# Patient Record
Sex: Female | Born: 1955 | Race: Black or African American | Hispanic: No | Marital: Married | Smoking: Current every day smoker
Health system: Southern US, Community
[De-identification: ages and names within clinical notes are randomized; demographics above are authoritative.]

## PROBLEM LIST (undated history)

## (undated) DIAGNOSIS — E785 Hyperlipidemia, unspecified: Secondary | ICD-10-CM

## (undated) DIAGNOSIS — I639 Cerebral infarction, unspecified: Secondary | ICD-10-CM

## (undated) DIAGNOSIS — M1990 Unspecified osteoarthritis, unspecified site: Secondary | ICD-10-CM

## (undated) DIAGNOSIS — I2510 Atherosclerotic heart disease of native coronary artery without angina pectoris: Secondary | ICD-10-CM

## (undated) DIAGNOSIS — D649 Anemia, unspecified: Secondary | ICD-10-CM

## (undated) DIAGNOSIS — M069 Rheumatoid arthritis, unspecified: Secondary | ICD-10-CM

## (undated) DIAGNOSIS — I70261 Atherosclerosis of native arteries of extremities with gangrene, right leg: Secondary | ICD-10-CM

## (undated) DIAGNOSIS — I10 Essential (primary) hypertension: Secondary | ICD-10-CM

## (undated) DIAGNOSIS — K635 Polyp of colon: Secondary | ICD-10-CM

## (undated) DIAGNOSIS — N179 Acute kidney failure, unspecified: Secondary | ICD-10-CM

## (undated) HISTORY — PX: ANKLE SURGERY: SHX546

## (undated) HISTORY — DX: Unspecified osteoarthritis, unspecified site: M19.90

## (undated) HISTORY — PX: CAROTID STENT INSERTION: SHX5766

## (undated) HISTORY — DX: Rheumatoid arthritis, unspecified: M06.9

## (undated) HISTORY — DX: Polyp of colon: K63.5

## (undated) HISTORY — DX: Atherosclerosis of native arteries of extremities with gangrene, right leg: I70.261

## (undated) HISTORY — DX: Cerebral infarction, unspecified: I63.9

## (undated) HISTORY — DX: Hyperlipidemia, unspecified: E78.5

## (undated) HISTORY — PX: OOPHORECTOMY: SHX86

## (undated) HISTORY — PX: FRACTURE SURGERY: SHX138

## (undated) HISTORY — DX: Atherosclerotic heart disease of native coronary artery without angina pectoris: I25.10

## (undated) HISTORY — DX: Essential (primary) hypertension: I10

## (undated) HISTORY — DX: Anemia, unspecified: D64.9

---

## 2003-04-20 DIAGNOSIS — I10 Essential (primary) hypertension: Secondary | ICD-10-CM | POA: Diagnosis present

## 2003-04-20 HISTORY — DX: Essential (primary) hypertension: I10

## 2003-05-23 ENCOUNTER — Ambulatory Visit (HOSPITAL_COMMUNITY): Admission: RE | Admit: 2003-05-23 | Discharge: 2003-05-23 | Payer: Self-pay | Admitting: General Surgery

## 2003-07-03 ENCOUNTER — Ambulatory Visit (HOSPITAL_COMMUNITY): Admission: RE | Admit: 2003-07-03 | Discharge: 2003-07-03 | Payer: Self-pay | Admitting: General Surgery

## 2003-07-19 DIAGNOSIS — I2510 Atherosclerotic heart disease of native coronary artery without angina pectoris: Secondary | ICD-10-CM

## 2003-07-19 HISTORY — DX: Atherosclerotic heart disease of native coronary artery without angina pectoris: I25.10

## 2003-07-23 ENCOUNTER — Inpatient Hospital Stay (HOSPITAL_COMMUNITY): Admission: EM | Admit: 2003-07-23 | Discharge: 2003-07-25 | Payer: Self-pay | Admitting: *Deleted

## 2004-01-28 ENCOUNTER — Encounter (HOSPITAL_COMMUNITY): Admission: RE | Admit: 2004-01-28 | Discharge: 2004-01-28 | Payer: Self-pay | Admitting: Cardiology

## 2004-02-26 ENCOUNTER — Ambulatory Visit: Payer: Self-pay | Admitting: Cardiology

## 2005-12-21 ENCOUNTER — Encounter: Admission: RE | Admit: 2005-12-21 | Discharge: 2005-12-21 | Payer: Self-pay

## 2006-02-14 ENCOUNTER — Ambulatory Visit: Payer: Self-pay | Admitting: Cardiology

## 2006-05-03 ENCOUNTER — Ambulatory Visit (HOSPITAL_BASED_OUTPATIENT_CLINIC_OR_DEPARTMENT_OTHER): Admission: RE | Admit: 2006-05-03 | Discharge: 2006-05-03 | Payer: Self-pay | Admitting: Orthopedic Surgery

## 2007-05-23 ENCOUNTER — Ambulatory Visit: Payer: Self-pay | Admitting: Cardiology

## 2007-12-16 ENCOUNTER — Emergency Department (HOSPITAL_COMMUNITY): Admission: EM | Admit: 2007-12-16 | Discharge: 2007-12-17 | Payer: Self-pay | Admitting: Emergency Medicine

## 2008-04-19 DIAGNOSIS — K635 Polyp of colon: Secondary | ICD-10-CM

## 2008-04-19 HISTORY — DX: Polyp of colon: K63.5

## 2008-06-11 ENCOUNTER — Encounter: Payer: Self-pay | Admitting: Family Medicine

## 2008-08-17 DIAGNOSIS — I639 Cerebral infarction, unspecified: Secondary | ICD-10-CM

## 2008-08-17 HISTORY — DX: Cerebral infarction, unspecified: I63.9

## 2008-08-30 ENCOUNTER — Inpatient Hospital Stay (HOSPITAL_COMMUNITY): Admission: AD | Admit: 2008-08-30 | Discharge: 2008-09-04 | Payer: Self-pay | Admitting: Internal Medicine

## 2008-09-02 ENCOUNTER — Encounter (INDEPENDENT_AMBULATORY_CARE_PROVIDER_SITE_OTHER): Payer: Self-pay | Admitting: Internal Medicine

## 2008-09-03 ENCOUNTER — Ambulatory Visit: Payer: Self-pay | Admitting: Physical Medicine & Rehabilitation

## 2008-09-04 ENCOUNTER — Ambulatory Visit: Payer: Self-pay | Admitting: Physical Medicine & Rehabilitation

## 2008-09-04 ENCOUNTER — Ambulatory Visit: Payer: Self-pay | Admitting: Cardiology

## 2008-09-04 ENCOUNTER — Encounter: Payer: Self-pay | Admitting: Cardiology

## 2008-09-04 ENCOUNTER — Inpatient Hospital Stay (HOSPITAL_COMMUNITY)
Admission: RE | Admit: 2008-09-04 | Discharge: 2008-10-02 | Payer: Self-pay | Admitting: Physical Medicine & Rehabilitation

## 2008-10-14 ENCOUNTER — Encounter (INDEPENDENT_AMBULATORY_CARE_PROVIDER_SITE_OTHER): Payer: Self-pay | Admitting: *Deleted

## 2008-10-14 LAB — CONVERTED CEMR LAB
ALT: 21 units/L
AST: 20 units/L
Albumin: 4.2 g/dL
Alkaline Phosphatase: 73 units/L
BUN: 7 mg/dL
CO2: 22 meq/L
Calcium: 9.4 mg/dL
Chloride: 106 meq/L
Cholesterol: 177 mg/dL
Creatinine, Ser: 0.54 mg/dL
Glucose, Bld: 85 mg/dL
HDL: 41 mg/dL
LDL Cholesterol: 112 mg/dL
Potassium: 4.2 meq/L
Sodium: 142 meq/L
Total Protein: 7.8 g/dL
Triglycerides: 121 mg/dL

## 2008-10-25 ENCOUNTER — Encounter
Admission: RE | Admit: 2008-10-25 | Discharge: 2009-01-23 | Payer: Self-pay | Admitting: Physical Medicine & Rehabilitation

## 2008-10-29 ENCOUNTER — Ambulatory Visit: Payer: Self-pay | Admitting: Physical Medicine & Rehabilitation

## 2008-11-07 ENCOUNTER — Emergency Department (HOSPITAL_COMMUNITY): Admission: EM | Admit: 2008-11-07 | Discharge: 2008-11-07 | Payer: Self-pay | Admitting: Emergency Medicine

## 2008-11-08 ENCOUNTER — Encounter (INDEPENDENT_AMBULATORY_CARE_PROVIDER_SITE_OTHER): Payer: Self-pay | Admitting: *Deleted

## 2008-11-14 ENCOUNTER — Encounter (INDEPENDENT_AMBULATORY_CARE_PROVIDER_SITE_OTHER): Payer: Self-pay | Admitting: *Deleted

## 2008-11-14 ENCOUNTER — Ambulatory Visit: Payer: Self-pay | Admitting: Family Medicine

## 2008-11-14 DIAGNOSIS — K625 Hemorrhage of anus and rectum: Secondary | ICD-10-CM | POA: Insufficient documentation

## 2008-11-14 DIAGNOSIS — I10 Essential (primary) hypertension: Secondary | ICD-10-CM | POA: Insufficient documentation

## 2008-11-14 DIAGNOSIS — E785 Hyperlipidemia, unspecified: Secondary | ICD-10-CM | POA: Insufficient documentation

## 2008-11-17 HISTORY — PX: COLONOSCOPY W/ POLYPECTOMY: SHX1380

## 2008-11-18 ENCOUNTER — Encounter: Payer: Self-pay | Admitting: Family Medicine

## 2008-11-19 ENCOUNTER — Encounter: Payer: Self-pay | Admitting: Family Medicine

## 2008-11-20 ENCOUNTER — Encounter: Payer: Self-pay | Admitting: Family Medicine

## 2008-11-20 LAB — CONVERTED CEMR LAB: Retic Ct Pct: 0.9 % (ref 0.4–3.1)

## 2008-11-21 ENCOUNTER — Encounter (INDEPENDENT_AMBULATORY_CARE_PROVIDER_SITE_OTHER): Payer: Self-pay | Admitting: *Deleted

## 2008-11-22 ENCOUNTER — Encounter: Payer: Self-pay | Admitting: Family Medicine

## 2008-11-26 LAB — CONVERTED CEMR LAB
ALT: 16 units/L (ref 0–35)
AST: 21 units/L (ref 0–37)
Albumin: 3.9 g/dL (ref 3.5–5.2)
Alkaline Phosphatase: 77 units/L (ref 39–117)
BUN: 5 mg/dL — ABNORMAL LOW (ref 6–23)
Basophils Absolute: 0 10*3/uL (ref 0.0–0.1)
Basophils Relative: 0 % (ref 0–1)
Bilirubin, Direct: 0.1 mg/dL (ref 0.0–0.3)
CO2: 22 meq/L (ref 19–32)
Calcium: 9.6 mg/dL (ref 8.4–10.5)
Chloride: 106 meq/L (ref 96–112)
Cholesterol: 174 mg/dL (ref 0–200)
Creatinine, Ser: 0.55 mg/dL (ref 0.40–1.20)
Eosinophils Absolute: 0.1 10*3/uL (ref 0.0–0.7)
Eosinophils Relative: 1 % (ref 0–5)
Glucose, Bld: 84 mg/dL (ref 70–99)
HCT: 34.1 % — ABNORMAL LOW (ref 36.0–46.0)
HDL: 39 mg/dL — ABNORMAL LOW (ref >39)
Hemoglobin: 10.6 g/dL — ABNORMAL LOW (ref 12.0–15.0)
Indirect Bilirubin: 0.3 mg/dL (ref 0.0–0.9)
LDL Cholesterol: 115 mg/dL — ABNORMAL HIGH (ref 0–99)
Lymphocytes Relative: 38 % (ref 12–46)
Lymphs Abs: 2.2 10*3/uL (ref 0.7–4.0)
MCHC: 31.1 g/dL (ref 30.0–36.0)
MCV: 91.7 fL (ref 78.0–100.0)
Monocytes Absolute: 0.7 10*3/uL (ref 0.1–1.0)
Monocytes Relative: 12 % (ref 3–12)
Neutro Abs: 2.9 10*3/uL (ref 1.7–7.7)
Neutrophils Relative %: 49 % (ref 43–77)
Platelets: 322 10*3/uL (ref 150–400)
Potassium: 3.9 meq/L (ref 3.5–5.3)
RBC: 3.72 M/uL — ABNORMAL LOW (ref 3.87–5.11)
RDW: 14.6 % (ref 11.5–15.5)
Sodium: 143 meq/L (ref 135–145)
TSH: 1.035 microintl units/mL (ref 0.350–4.500)
Total Bilirubin: 0.4 mg/dL (ref 0.3–1.2)
Total CHOL/HDL Ratio: 4.5
Total Protein: 7.5 g/dL (ref 6.0–8.3)
Triglycerides: 101 mg/dL (ref <150)
VLDL: 20 mg/dL (ref 0–40)
WBC: 6 10*3/uL (ref 4.0–10.5)

## 2008-11-28 ENCOUNTER — Telehealth: Payer: Self-pay | Admitting: Internal Medicine

## 2008-11-28 ENCOUNTER — Encounter: Payer: Self-pay | Admitting: Internal Medicine

## 2008-11-28 ENCOUNTER — Telehealth (INDEPENDENT_AMBULATORY_CARE_PROVIDER_SITE_OTHER): Payer: Self-pay | Admitting: *Deleted

## 2008-11-28 ENCOUNTER — Emergency Department (HOSPITAL_COMMUNITY): Admission: EM | Admit: 2008-11-28 | Discharge: 2008-11-28 | Payer: Self-pay | Admitting: Emergency Medicine

## 2008-11-28 ENCOUNTER — Telehealth: Payer: Self-pay | Admitting: Family Medicine

## 2008-11-29 ENCOUNTER — Ambulatory Visit: Payer: Self-pay | Admitting: Internal Medicine

## 2008-11-29 ENCOUNTER — Encounter: Payer: Self-pay | Admitting: Internal Medicine

## 2008-11-29 ENCOUNTER — Ambulatory Visit (HOSPITAL_COMMUNITY): Admission: RE | Admit: 2008-11-29 | Discharge: 2008-11-29 | Payer: Self-pay | Admitting: Internal Medicine

## 2008-12-10 ENCOUNTER — Ambulatory Visit: Payer: Self-pay | Admitting: Cardiology

## 2008-12-10 ENCOUNTER — Encounter: Payer: Self-pay | Admitting: Internal Medicine

## 2008-12-11 ENCOUNTER — Encounter (INDEPENDENT_AMBULATORY_CARE_PROVIDER_SITE_OTHER): Payer: Self-pay | Admitting: *Deleted

## 2008-12-12 ENCOUNTER — Encounter (INDEPENDENT_AMBULATORY_CARE_PROVIDER_SITE_OTHER): Payer: Self-pay | Admitting: *Deleted

## 2008-12-12 ENCOUNTER — Encounter: Payer: Self-pay | Admitting: Cardiology

## 2008-12-12 LAB — CONVERTED CEMR LAB
ALT: 20 units/L
ALT: 20 units/L (ref 0–35)
AST: 22 units/L
AST: 22 units/L (ref 0–37)
Albumin: 4.2 g/dL
Albumin: 4.2 g/dL (ref 3.5–5.2)
Alkaline Phosphatase: 79 units/L
Alkaline Phosphatase: 79 units/L (ref 39–117)
BUN: 10 mg/dL
BUN: 10 mg/dL (ref 6–23)
CO2: 25 meq/L
CO2: 25 meq/L (ref 19–32)
Calcium: 9.8 mg/dL
Calcium: 9.8 mg/dL (ref 8.4–10.5)
Chloride: 104 meq/L
Chloride: 104 meq/L (ref 96–112)
Cholesterol: 167 mg/dL
Cholesterol: 167 mg/dL (ref 0–200)
Creatinine, Ser: 0.48 mg/dL
Creatinine, Ser: 0.48 mg/dL (ref 0.40–1.20)
Glucose, Bld: 88 mg/dL
Glucose, Bld: 88 mg/dL (ref 70–99)
HDL: 42 mg/dL
HDL: 42 mg/dL (ref >39)
LDL Cholesterol: 109 mg/dL
LDL Cholesterol: 109 mg/dL — ABNORMAL HIGH (ref 0–99)
Potassium: 3.6 meq/L
Potassium: 3.6 meq/L (ref 3.5–5.3)
Sodium: 140 meq/L
Sodium: 140 meq/L (ref 135–145)
Total Bilirubin: 0.3 mg/dL (ref 0.3–1.2)
Total CHOL/HDL Ratio: 4
Total Protein: 8 g/dL
Total Protein: 8 g/dL (ref 6.0–8.3)
Triglycerides: 78 mg/dL
Triglycerides: 78 mg/dL (ref <150)
VLDL: 16 mg/dL (ref 0–40)

## 2008-12-16 ENCOUNTER — Ambulatory Visit: Payer: Self-pay | Admitting: Physical Medicine & Rehabilitation

## 2008-12-16 ENCOUNTER — Encounter: Payer: Self-pay | Admitting: Internal Medicine

## 2008-12-16 ENCOUNTER — Encounter: Payer: Self-pay | Admitting: Cardiology

## 2008-12-16 ENCOUNTER — Telehealth (INDEPENDENT_AMBULATORY_CARE_PROVIDER_SITE_OTHER): Payer: Self-pay

## 2008-12-17 ENCOUNTER — Telehealth: Payer: Self-pay | Admitting: Family Medicine

## 2008-12-17 ENCOUNTER — Telehealth (INDEPENDENT_AMBULATORY_CARE_PROVIDER_SITE_OTHER): Payer: Self-pay

## 2008-12-17 ENCOUNTER — Ambulatory Visit: Payer: Self-pay | Admitting: Internal Medicine

## 2008-12-18 ENCOUNTER — Encounter: Payer: Self-pay | Admitting: Internal Medicine

## 2009-01-03 LAB — CONVERTED CEMR LAB
Basophils Absolute: 0 10*3/uL (ref 0.0–0.1)
Basophils Relative: 1 % (ref 0–1)
Eosinophils Absolute: 0 10*3/uL (ref 0.0–0.7)
Eosinophils Relative: 1 % (ref 0–5)
HCT: 31.3 % — ABNORMAL LOW (ref 36.0–46.0)
Hemoglobin: 10.3 g/dL — ABNORMAL LOW (ref 12.0–15.0)
Lymphocytes Relative: 33 % (ref 12–46)
Lymphs Abs: 1.7 10*3/uL (ref 0.7–4.0)
MCHC: 32.9 g/dL (ref 30.0–36.0)
MCV: 91 fL (ref 78.0–100.0)
Monocytes Absolute: 0.4 10*3/uL (ref 0.1–1.0)
Monocytes Relative: 7 % (ref 3–12)
Neutro Abs: 3 10*3/uL (ref 1.7–7.7)
Neutrophils Relative %: 58 % (ref 43–77)
Platelets: 240 10*3/uL (ref 150–400)
RBC: 3.44 M/uL — ABNORMAL LOW (ref 3.87–5.11)
RDW: 15.6 % — ABNORMAL HIGH (ref 11.5–15.5)
WBC: 5.1 10*3/uL (ref 4.0–10.5)

## 2009-01-07 ENCOUNTER — Encounter: Payer: Self-pay | Admitting: Internal Medicine

## 2009-01-09 LAB — CONVERTED CEMR LAB
Basophils Absolute: 0 10*3/uL (ref 0.0–0.1)
Basophils Relative: 0 % (ref 0–1)
Eosinophils Absolute: 0 10*3/uL (ref 0.0–0.7)
Eosinophils Relative: 1 % (ref 0–5)
HCT: 32.1 % — ABNORMAL LOW (ref 36.0–46.0)
Hemoglobin: 10.5 g/dL — ABNORMAL LOW (ref 12.0–15.0)
Lymphocytes Relative: 33 % (ref 12–46)
Lymphs Abs: 2.1 10*3/uL (ref 0.7–4.0)
MCHC: 32.7 g/dL (ref 30.0–36.0)
MCV: 87.5 fL (ref 78.0–100.0)
Monocytes Absolute: 0.9 10*3/uL (ref 0.1–1.0)
Monocytes Relative: 13 % — ABNORMAL HIGH (ref 3–12)
Neutro Abs: 3.5 10*3/uL (ref 1.7–7.7)
Neutrophils Relative %: 53 % (ref 43–77)
Platelets: 286 10*3/uL (ref 150–400)
RBC: 3.67 M/uL — ABNORMAL LOW (ref 3.87–5.11)
RDW: 15.8 % — ABNORMAL HIGH (ref 11.5–15.5)
WBC: 6.5 10*3/uL (ref 4.0–10.5)

## 2009-01-10 ENCOUNTER — Encounter
Admission: RE | Admit: 2009-01-10 | Discharge: 2009-04-10 | Payer: Self-pay | Admitting: Physical Medicine & Rehabilitation

## 2009-01-15 ENCOUNTER — Ambulatory Visit: Payer: Self-pay | Admitting: Family Medicine

## 2009-01-15 DIAGNOSIS — B351 Tinea unguium: Secondary | ICD-10-CM | POA: Insufficient documentation

## 2009-01-16 ENCOUNTER — Ambulatory Visit
Admission: RE | Admit: 2009-01-16 | Discharge: 2009-01-16 | Payer: Self-pay | Admitting: Physical Medicine & Rehabilitation

## 2009-01-17 ENCOUNTER — Ambulatory Visit: Payer: Self-pay | Admitting: Physical Medicine & Rehabilitation

## 2009-01-21 ENCOUNTER — Encounter (HOSPITAL_COMMUNITY)
Admission: RE | Admit: 2009-01-21 | Discharge: 2009-02-20 | Payer: Self-pay | Admitting: Physical Medicine & Rehabilitation

## 2009-02-03 ENCOUNTER — Telehealth: Payer: Self-pay | Admitting: Family Medicine

## 2009-02-11 ENCOUNTER — Encounter: Payer: Self-pay | Admitting: Family Medicine

## 2009-02-21 ENCOUNTER — Ambulatory Visit: Payer: Self-pay | Admitting: Physical Medicine & Rehabilitation

## 2009-02-21 ENCOUNTER — Encounter (HOSPITAL_COMMUNITY): Admission: RE | Admit: 2009-02-21 | Discharge: 2009-03-27 | Payer: Self-pay

## 2009-03-05 ENCOUNTER — Encounter (INDEPENDENT_AMBULATORY_CARE_PROVIDER_SITE_OTHER): Payer: Self-pay

## 2009-03-28 ENCOUNTER — Ambulatory Visit (HOSPITAL_COMMUNITY): Admission: RE | Admit: 2009-03-28 | Discharge: 2009-03-28 | Payer: Self-pay | Admitting: Interventional Radiology

## 2009-03-28 ENCOUNTER — Encounter (HOSPITAL_COMMUNITY)
Admission: RE | Admit: 2009-03-28 | Discharge: 2009-04-18 | Payer: Self-pay | Admitting: Physical Medicine & Rehabilitation

## 2009-04-01 ENCOUNTER — Ambulatory Visit: Payer: Self-pay | Admitting: Internal Medicine

## 2009-04-01 DIAGNOSIS — D649 Anemia, unspecified: Secondary | ICD-10-CM

## 2009-04-01 DIAGNOSIS — D539 Nutritional anemia, unspecified: Secondary | ICD-10-CM | POA: Insufficient documentation

## 2009-04-21 ENCOUNTER — Encounter
Admission: RE | Admit: 2009-04-21 | Discharge: 2009-07-20 | Payer: Self-pay | Admitting: Physical Medicine & Rehabilitation

## 2009-04-22 ENCOUNTER — Encounter (HOSPITAL_COMMUNITY)
Admission: RE | Admit: 2009-04-22 | Discharge: 2009-05-22 | Payer: Self-pay | Admitting: Physical Medicine & Rehabilitation

## 2009-04-28 ENCOUNTER — Emergency Department (HOSPITAL_COMMUNITY): Admission: EM | Admit: 2009-04-28 | Discharge: 2009-04-28 | Payer: Self-pay | Admitting: Emergency Medicine

## 2009-05-01 ENCOUNTER — Ambulatory Visit: Payer: Self-pay | Admitting: Family Medicine

## 2009-05-01 ENCOUNTER — Encounter
Admission: RE | Admit: 2009-05-01 | Discharge: 2009-07-30 | Payer: Self-pay | Admitting: Physical Medicine & Rehabilitation

## 2009-05-02 ENCOUNTER — Encounter: Payer: Self-pay | Admitting: Family Medicine

## 2009-05-06 ENCOUNTER — Ambulatory Visit (HOSPITAL_COMMUNITY): Admission: RE | Admit: 2009-05-06 | Discharge: 2009-05-06 | Payer: Self-pay | Admitting: Family Medicine

## 2009-05-06 ENCOUNTER — Encounter: Payer: Self-pay | Admitting: Family Medicine

## 2009-05-06 LAB — HM MAMMOGRAPHY

## 2009-05-07 ENCOUNTER — Encounter: Payer: Self-pay | Admitting: Family Medicine

## 2009-05-07 LAB — CONVERTED CEMR LAB
Cholesterol: 168 mg/dL (ref 0–200)
HDL: 37 mg/dL — ABNORMAL LOW (ref >39)
LDL Cholesterol: 109 mg/dL — ABNORMAL HIGH (ref 0–99)
Total CHOL/HDL Ratio: 4.5
Triglycerides: 111 mg/dL (ref <150)
VLDL: 22 mg/dL (ref 0–40)

## 2009-05-12 ENCOUNTER — Ambulatory Visit: Payer: Self-pay | Admitting: Gastroenterology

## 2009-05-15 ENCOUNTER — Encounter: Payer: Self-pay | Admitting: Internal Medicine

## 2009-05-23 ENCOUNTER — Encounter (INDEPENDENT_AMBULATORY_CARE_PROVIDER_SITE_OTHER): Payer: Self-pay

## 2009-05-27 ENCOUNTER — Encounter (HOSPITAL_COMMUNITY)
Admission: RE | Admit: 2009-05-27 | Discharge: 2009-07-29 | Payer: Self-pay | Admitting: Physical Medicine & Rehabilitation

## 2009-07-11 ENCOUNTER — Encounter: Payer: Self-pay | Admitting: Family Medicine

## 2009-07-18 ENCOUNTER — Encounter: Payer: Self-pay | Admitting: Family Medicine

## 2009-07-30 ENCOUNTER — Encounter (HOSPITAL_COMMUNITY)
Admission: RE | Admit: 2009-07-30 | Discharge: 2009-08-29 | Payer: Self-pay | Admitting: Physical Medicine & Rehabilitation

## 2009-08-06 ENCOUNTER — Ambulatory Visit: Payer: Self-pay | Admitting: Family Medicine

## 2009-08-06 LAB — CONVERTED CEMR LAB
ALT: 16 units/L (ref 0–35)
AST: 18 units/L (ref 0–37)
Albumin: 4.1 g/dL (ref 3.5–5.2)
Alkaline Phosphatase: 79 units/L (ref 39–117)
BUN: 7 mg/dL (ref 6–23)
CO2: 24 meq/L (ref 19–32)
Calcium: 9.9 mg/dL (ref 8.4–10.5)
Chloride: 104 meq/L (ref 96–112)
Cholesterol: 170 mg/dL (ref 0–200)
Creatinine, Ser: 0.54 mg/dL (ref 0.40–1.20)
Glucose, Bld: 92 mg/dL (ref 70–99)
HCT: 36.8 % (ref 36.0–46.0)
HDL: 37 mg/dL — ABNORMAL LOW (ref >39)
Hemoglobin: 12.1 g/dL (ref 12.0–15.0)
LDL Cholesterol: 102 mg/dL — ABNORMAL HIGH (ref 0–99)
MCHC: 32.9 g/dL (ref 30.0–36.0)
MCV: 90.4 fL (ref 78.0–100.0)
Platelets: 264 10*3/uL (ref 150–400)
Potassium: 4.5 meq/L (ref 3.5–5.3)
RBC: 4.07 M/uL (ref 3.87–5.11)
RDW: 15.3 % (ref 11.5–15.5)
Sodium: 142 meq/L (ref 135–145)
TSH: 0.579 microintl units/mL (ref 0.350–4.500)
Total Bilirubin: 0.3 mg/dL (ref 0.3–1.2)
Total CHOL/HDL Ratio: 4.6
Total Protein: 7.6 g/dL (ref 6.0–8.3)
Triglycerides: 157 mg/dL — ABNORMAL HIGH (ref <150)
VLDL: 31 mg/dL (ref 0–40)
Vit D, 25-Hydroxy: 46 ng/mL (ref 30–89)
WBC: 6.5 10*3/uL (ref 4.0–10.5)

## 2009-08-15 ENCOUNTER — Ambulatory Visit: Payer: Self-pay | Admitting: Internal Medicine

## 2009-09-04 ENCOUNTER — Encounter (HOSPITAL_COMMUNITY)
Admission: RE | Admit: 2009-09-04 | Discharge: 2009-10-04 | Payer: Self-pay | Admitting: Physical Medicine & Rehabilitation

## 2009-09-09 ENCOUNTER — Encounter: Payer: Self-pay | Admitting: Family Medicine

## 2009-09-24 ENCOUNTER — Ambulatory Visit: Payer: Self-pay | Admitting: Family Medicine

## 2009-10-01 DIAGNOSIS — R4701 Aphasia: Secondary | ICD-10-CM | POA: Insufficient documentation

## 2009-10-02 ENCOUNTER — Encounter: Payer: Self-pay | Admitting: Family Medicine

## 2009-10-06 ENCOUNTER — Encounter (HOSPITAL_COMMUNITY)
Admission: RE | Admit: 2009-10-06 | Discharge: 2009-11-05 | Payer: Self-pay | Admitting: Physical Medicine & Rehabilitation

## 2009-10-07 ENCOUNTER — Telehealth: Payer: Self-pay | Admitting: Family Medicine

## 2009-10-10 ENCOUNTER — Encounter: Payer: Self-pay | Admitting: Family Medicine

## 2009-10-28 ENCOUNTER — Encounter: Payer: Self-pay | Admitting: Family Medicine

## 2009-10-31 ENCOUNTER — Telehealth: Payer: Self-pay | Admitting: Family Medicine

## 2009-11-03 ENCOUNTER — Encounter: Payer: Self-pay | Admitting: Family Medicine

## 2009-11-06 ENCOUNTER — Ambulatory Visit: Payer: Self-pay | Admitting: Family Medicine

## 2009-11-06 ENCOUNTER — Encounter (HOSPITAL_COMMUNITY)
Admission: RE | Admit: 2009-11-06 | Discharge: 2009-12-06 | Payer: Self-pay | Source: Home / Self Care | Admitting: Physical Medicine & Rehabilitation

## 2009-11-07 ENCOUNTER — Encounter: Payer: Self-pay | Admitting: Family Medicine

## 2009-11-14 LAB — CONVERTED CEMR LAB
ALT: 21 units/L (ref 0–35)
AST: 20 units/L (ref 0–37)
Albumin: 4.2 g/dL (ref 3.5–5.2)
Alkaline Phosphatase: 73 units/L (ref 39–117)
BUN: 7 mg/dL (ref 6–23)
CO2: 22 meq/L (ref 19–32)
Calcium: 9.4 mg/dL (ref 8.4–10.5)
Chloride: 106 meq/L (ref 96–112)
Cholesterol: 177 mg/dL (ref 0–200)
Creatinine, Ser: 0.54 mg/dL (ref 0.40–1.20)
Glucose, Bld: 85 mg/dL (ref 70–99)
HDL: 41 mg/dL (ref >39)
LDL Cholesterol: 112 mg/dL — ABNORMAL HIGH (ref 0–99)
Potassium: 4.2 meq/L (ref 3.5–5.3)
Sodium: 142 meq/L (ref 135–145)
Total Bilirubin: 0.3 mg/dL (ref 0.3–1.2)
Total CHOL/HDL Ratio: 4.3
Total Protein: 7.8 g/dL (ref 6.0–8.3)
Triglycerides: 121 mg/dL (ref <150)
VLDL: 24 mg/dL (ref 0–40)

## 2009-12-01 ENCOUNTER — Encounter: Payer: Self-pay | Admitting: Family Medicine

## 2009-12-09 ENCOUNTER — Encounter (HOSPITAL_COMMUNITY): Admission: RE | Admit: 2009-12-09 | Discharge: 2010-01-08 | Payer: Self-pay | Admitting: Family Medicine

## 2009-12-18 ENCOUNTER — Encounter (HOSPITAL_COMMUNITY)
Admission: RE | Admit: 2009-12-18 | Discharge: 2010-01-17 | Payer: Self-pay | Admitting: Physical Medicine & Rehabilitation

## 2010-01-01 ENCOUNTER — Ambulatory Visit: Payer: Self-pay | Admitting: Family Medicine

## 2010-01-01 DIAGNOSIS — M25519 Pain in unspecified shoulder: Secondary | ICD-10-CM | POA: Insufficient documentation

## 2010-01-09 ENCOUNTER — Encounter (INDEPENDENT_AMBULATORY_CARE_PROVIDER_SITE_OTHER): Payer: Self-pay | Admitting: *Deleted

## 2010-01-14 ENCOUNTER — Encounter (INDEPENDENT_AMBULATORY_CARE_PROVIDER_SITE_OTHER): Payer: Self-pay | Admitting: *Deleted

## 2010-01-14 ENCOUNTER — Ambulatory Visit: Payer: Self-pay | Admitting: Cardiology

## 2010-01-14 DIAGNOSIS — F17200 Nicotine dependence, unspecified, uncomplicated: Secondary | ICD-10-CM | POA: Insufficient documentation

## 2010-01-14 DIAGNOSIS — I2510 Atherosclerotic heart disease of native coronary artery without angina pectoris: Secondary | ICD-10-CM | POA: Insufficient documentation

## 2010-01-14 DIAGNOSIS — I739 Peripheral vascular disease, unspecified: Secondary | ICD-10-CM | POA: Insufficient documentation

## 2010-01-21 LAB — CONVERTED CEMR LAB
ALT: 25 units/L (ref 0–35)
AST: 26 units/L (ref 0–37)
Albumin: 4.1 g/dL (ref 3.5–5.2)
Alkaline Phosphatase: 76 units/L (ref 39–117)
BUN: 6 mg/dL (ref 6–23)
CO2: 24 meq/L (ref 19–32)
Calcium: 9.4 mg/dL (ref 8.4–10.5)
Chloride: 105 meq/L (ref 96–112)
Cholesterol: 165 mg/dL (ref 0–200)
Creatinine, Ser: 0.55 mg/dL (ref 0.40–1.20)
Glucose, Bld: 99 mg/dL (ref 70–99)
HCT: 37.9 % (ref 36.0–46.0)
HDL: 31 mg/dL — ABNORMAL LOW (ref >39)
Hemoglobin: 12.1 g/dL (ref 12.0–15.0)
LDL Cholesterol: 101 mg/dL — ABNORMAL HIGH (ref 0–99)
MCHC: 31.9 g/dL (ref 30.0–36.0)
MCV: 92.2 fL (ref 78.0–100.0)
Platelets: 233 10*3/uL (ref 150–400)
Potassium: 4.3 meq/L (ref 3.5–5.3)
RBC: 4.11 M/uL (ref 3.87–5.11)
RDW: 15.3 % (ref 11.5–15.5)
Sodium: 142 meq/L (ref 135–145)
Total Bilirubin: 0.3 mg/dL (ref 0.3–1.2)
Total CHOL/HDL Ratio: 5.3
Total Protein: 7.3 g/dL (ref 6.0–8.3)
Triglycerides: 166 mg/dL — ABNORMAL HIGH (ref <150)
VLDL: 33 mg/dL (ref 0–40)
WBC: 6.1 10*3/uL (ref 4.0–10.5)

## 2010-03-04 ENCOUNTER — Telehealth: Payer: Self-pay | Admitting: Family Medicine

## 2010-03-05 ENCOUNTER — Telehealth (INDEPENDENT_AMBULATORY_CARE_PROVIDER_SITE_OTHER): Payer: Self-pay | Admitting: *Deleted

## 2010-03-09 ENCOUNTER — Telehealth: Payer: Self-pay | Admitting: Family Medicine

## 2010-03-09 ENCOUNTER — Ambulatory Visit: Payer: Self-pay | Admitting: Family Medicine

## 2010-03-09 DIAGNOSIS — N39498 Other specified urinary incontinence: Secondary | ICD-10-CM | POA: Insufficient documentation

## 2010-03-16 ENCOUNTER — Telehealth (INDEPENDENT_AMBULATORY_CARE_PROVIDER_SITE_OTHER): Payer: Self-pay | Admitting: *Deleted

## 2010-03-16 DIAGNOSIS — I69959 Hemiplegia and hemiparesis following unspecified cerebrovascular disease affecting unspecified side: Secondary | ICD-10-CM | POA: Insufficient documentation

## 2010-03-17 ENCOUNTER — Ambulatory Visit
Admission: RE | Admit: 2010-03-17 | Discharge: 2010-03-17 | Payer: Self-pay | Source: Home / Self Care | Admitting: Family Medicine

## 2010-05-04 ENCOUNTER — Encounter: Payer: Self-pay | Admitting: Family Medicine

## 2010-05-10 ENCOUNTER — Encounter: Payer: Self-pay | Admitting: Family Medicine

## 2010-05-19 NOTE — Letter (Signed)
Summary: lingraphic  lingraphic   Imported By: Lind Guest 07/18/2009 14:20:27  _____________________________________________________________________  External Attachment:    Type:   Image     Comment:   External Document

## 2010-05-19 NOTE — Progress Notes (Signed)
Summary: referral  Phone Note Other Incoming   Caller: dr Zaryan Yakubov Summary of Call: pls refer the pt to physical therapy for evaluation and treatment twice weekly for 4 to 6 weeks for right shoulder weakness Initial call taken by: Syliva Overman MD,  October 31, 2009 12:43 PM  Follow-up for Phone Call        pt has been referred to aph physical therpy. they will call pt with a appt. left message Follow-up by: Rudene Anda,  October 31, 2009 4:11 PM

## 2010-05-19 NOTE — Miscellaneous (Signed)
Summary: Orders Update  Clinical Lists Changes  Orders: Added new Test order of T-Basic Metabolic Panel ([id number]) - Signed Added new Test order of T-Hepatic Function ([id number]) - Signed Added new Test order of T-Lipid Profile ([id number]) - Signed Added new Test order of T-CBC w/Diff ([id number]) - Signed Added new Test order of T-TSH ([id number]) - Signed  

## 2010-05-19 NOTE — Progress Notes (Signed)
Summary: [facility]  [facility]   Imported By: Lind Guest 11/07/2009 11:19:30  _____________________________________________________________________  External Attachment:    Type:   Image     Comment:   External Document

## 2010-05-19 NOTE — Progress Notes (Signed)
Summary: WHEEL CHAIR  Phone Note Call from Patient   Summary of Call: WANTS TO BE EVAULATED  FOR A WHEELCHAIR  [employer] CALLED  NEEDS A REFERRAL TO PHYSICAL THERAPY Initial call taken by: Lind Guest,  February 03, 2009 10:53 AM  Follow-up for Phone Call        advise pt we will set up an appt for her to go to physical terqpy for evaluation, if she qualifies I will work through local providers [place] wheelchair , pls find out if this is ok with her ,f so let her know to tell the company who called , no need to make other calls Follow-up by: Syliva Overman MD,  February 03, 2009 12:12 PM  Additional Follow-up for Phone Call Additional follow up Details #1::        Phone Call Completed Additional Follow-up by: Worthy Keeler LPN,  February 03, 2009 12:22 PM

## 2010-05-19 NOTE — Assessment & Plan Note (Signed)
Summary: PAST DUE FOR 1 YR F/U PER PT'S SON/TG  Medications Added MULTIVITAMINS  TABS (MULTIPLE VITAMIN) Take 1 tablet by mouth once a day      Allergies Added: NKDA  Primary Provider:  Syliva Overman   History of Present Illness: Danielle Cruz returns to the office after an 18 month hiatus for continued assessment and treatment of coronary disease and cardiovascular risk factors.  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  A transesophageal echocardiogram revealed no apparent cardiac source.  She has received rehabilitation therapy, but continues to have a dense expressive aphasia, marked paresis of the right upper extremity and mild right lower extremity weakness.  She's had no chest pain or dyspnea.  No recent lipid profiles available  Current Medications (verified): 1)  Metoprolol Succinate 25 Mg Xr24h-Tab (Metoprolol Succinate) .... Take 1 Tablet By Mouth Once A Day 2)  Tramadol Hcl 50 Mg Tabs (Tramadol Hcl) .... Take 1 Tablet By Mouth Four Times A Day 3)  Baclofen 10 Mg Tabs (Baclofen) .... Take 1 Tablet By Mouth Two Times A Day 4)  Leflunomide 20 Mg Tabs (Leflunomide) .... Take 1 Tablet By Mouth Once A Day 5)  Oxybutynin Chloride 5 Mg Tabs (Oxybutynin Chloride) .... Take 1/2 Tab Two Times A Day 6)  Amlodipine Besylate 10 Mg Tabs (Amlodipine Besylate) .... Take 1 Tablet By Mouth Once A Day 7)  Bayer Aspirin 325 Mg Tabs (Aspirin) .... Take 1 Tablet By Mouth Once A Day 8)  Ferrous Gluconate 240 (27 Fe) Mg Tabs (Ferrous Gluconate) .... Take 1 Tablet By Mouth Once A Day 9)  Colace 50 Mg Caps (Docusate Sodium) .... One Tab By Mouth Bid 10)  Simvastatin 40 Mg Tabs (Simvastatin) .... One Tab By Mouth Qhs 11)  Multivitamins  Tabs (Multiple Vitamin) .... Take 1 Tablet By Mouth Once A Day  Allergies (verified): No Known Drug  Allergies  Review of Systems  The patient denies weight loss, weight gain, chest pain, syncope, and peripheral edema.    Vital Signs:  Patient profile:   55 year old female Height:      67 inches Weight:      134 pounds BMI:     21.06 Pulse rate:   73 / minute BP sitting:   119 / 75  (right arm)  Vitals Entered By: Dreama Saa, CNA (December 10, 2008 2:37 PM)  Physical Exam  General:   Well-developed; no acute distress;    Neck: No JVD; no carotid bruits;  Lungs: No tachypnea, clear without rales, rhonchi or wheezes;  Cardiovascular: normal PMI; normal S1 and S2;  Abdomen: BS normal; soft and non-tender without masses or organomegaly;  Musculoskeletal: No deformities, no cyanosis or clubbing;    Neurologic: Right central seventh nerve palsy; dense right upper extremity hemiparesis Skin:  Warm, no significant lesions;  Extremities: Trace edema; normal distal pulses.     Impression & Recommendations:  Problem # 1:  HYPERTENSION (ICD-401.9)  Blood pressure control is good; she will continue her current medications.  Problem # 2:  CAD (ICD-414.00)  There were no symptoms to suggest recurrent myocardial ischemia.  Optimization of risk factor control will be pursued.  Future Orders: T-Lipid Profile [id number]) ... 12/11/2008  Problem # 3:  HYPERLIPIDEMIA (ICD-272.4) Atorvastatin has been changed to simvastatin, apparently due to considerations of cost.  She has severe hyperlipidemia, and I doubt that her current therapy will be adequate.  A lipid profile will be obtained with adjustment of therapy as appropriate.  Hepatic function studies will also be obtained in conjunction with a complete metabolic profile.  Orders: T-Comprehensive Metabolic Panel ([id number])Future Orders: T-Lipid Profile ([id number]) ... 12/11/2008  Patient Instructions: 1)  Your physician recommends that you schedule a follow-up appointment in: 1 year 2)  Your physician recommends that you  return for a FASTING lipid profile: tomorrow

## 2010-05-19 NOTE — Letter (Signed)
Summary: REQUEST FOR MEDICAID SERVICES  REQUEST FOR MEDICAID SERVICES   Imported By: Lind Guest 11/04/2009 08:01:01  _____________________________________________________________________  External Attachment:    Type:   Image     Comment:   External Document

## 2010-05-19 NOTE — Letter (Signed)
Summary: RECORDS DR [facility] DR ROWE   Imported By: Diana Eves 05/15/2009 16:43:16  _____________________________________________________________________  External Attachment:    Type:   Image     Comment:   External Document

## 2010-05-19 NOTE — Miscellaneous (Signed)
Summary: Rehab Report  Rehab Report   Imported By: Lind Guest 07/18/2009 08:56:46  _____________________________________________________________________  External Attachment:    Type:   Image     Comment:   External Document

## 2010-05-19 NOTE — Progress Notes (Signed)
----   Converted from flag ---- ---- 03/16/2010 5:43 AM, Syliva Overman MD wrote: pls do not refer Danielle Cruz to eNT cancel the referral, she does not need to go and is aware ------------------------------  I called Dr. Berlinda Last office and cancelled appt for Dec. 8.

## 2010-05-19 NOTE — Assessment & Plan Note (Signed)
Summary: office visit   Vital Signs:  Patient profile:   55 year old female Height:      67 inches Weight:      135.25 pounds BMI:     21.26 O2 Sat:      97 % on Room air Pulse rate:   76 / minute Pulse rhythm:   regular Resp:     16 per minute BP sitting:   110 / 60  (left arm)  Vitals Entered By: Worthy Keeler LPN (May 01, 2009 8:48 AM)  O2 Flow:  Room air CC: hospital follow up Is Patient Diabetic? No Pain Assessment Patient in pain? no        Primary Care Provider:  Syliva Overman, MD  CC:  hospital follow up.  History of Present Illness: Pt was seen in the Ed 3 days ago, ghad an episode of unresposiveness with shallow breathing, and jerking of ALL 4 LIMBS no incontinece, lasted at least 10 mins, taken to ED by EMS, no acute finding. Residual weakness in right leg noted, pt has no recollection of the episode Mouth was  twisted to left during the episode but now back to nl. Concerns about a blockage in carotid and states she had an MRI with the neurologist in NOvember. Pt has no movement in the right hand and has stopped therapy for that , she goes twice weekly for walking , right leg and her speech  Allergies (verified): No Known Drug Allergies  Review of Systems      See HPI Eyes:  Denies blurring and discharge. ENT:  Denies nasal congestion and sinus pressure. CV:  Denies chest pain or discomfort, palpitations, and swelling of feet. Resp:  Denies cough and sputum productive. GI:  Denies abdominal pain, constipation, diarrhea, nausea, and vomiting. GU:  Denies dysuria and urinary frequency. MS:  Denies joint pain and stiffness. Derm:  Denies itching and rash. Neuro:  See HPI. Psych:  Denies anxiety and depression. Endo:  Denies cold intolerance, excessive hunger, excessive thirst, excessive urination, heat intolerance, polyuria, and weight change. Heme:  Denies abnormal bruising and bleeding. Allergy:  Denies hives or rash and sneezing.  Physical  Exam  General:  Well-developed,well-nourished,in no acute distress; alert,appropriate and cooperative throughout examination. Pt aphasic from stroke with right hemiparesis,more marked in the upper ext HEENT: No facial asymmetry,  EOMI, No sinus tenderness, TM's Clear, oropharynx  pink and moist.   Chest: Clear to auscultation bilaterally.  CVS: S1, S2, No murmurs, No S3.   Abd: Soft, Nontender.  MS: Adequate ROM spine, hips, shoulders and knees.  Ext: No edema.   CNS: CN 2-12 intact, power tone and sensation reduced in right upper and lower ext  Skin: Intact, no visible lesions or rashes.  Psych: Good eye contact, normal affect.  , not anxious or depressed appearing.     Impression & Recommendations:  Problem # 1:  HYPERTENSION (ICD-401.9) Assessment Unchanged  Her updated medication list for this problem includes:    Metoprolol Succinate 25 Mg Xr24h-tab (Metoprolol succinate) .Marland Kitchen... Take 1 tablet by mouth once a day    Amlodipine Besylate 10 Mg Tabs (Amlodipine besylate) .Marland Kitchen... Take 1 tablet by mouth once a day  BP today: 110/60 Prior BP: 120/80 (04/01/2009)  Labs Reviewed: K+: 3.6 (12/12/2008) Creat: : 0.48 (12/12/2008)   Chol: 167 (12/12/2008)   HDL: 42 (12/12/2008)   LDL: 109 (12/12/2008)   TG: 78 (12/12/2008)  Problem # 2:  HYPERLIPIDEMIA (ICD-272.4) Assessment: Comment Only  Her updated medication  list for this problem includes:    Simvastatin 40 Mg Tabs (Simvastatin) ..... One tab by mouth qhs  Orders: T-Lipid Profile [phone])  Labs Reviewed: SGOT: 22 (12/12/2008)   SGPT: 20 (12/12/2008)   HDL:42 (12/12/2008), 39 (11/19/2008)  LDL:109 (12/12/2008), 115 (11/19/2008)  Chol:167 (12/12/2008), 174 (11/19/2008)  Trig:78 (12/12/2008), 101 (11/19/2008)  Problem # 3:  CVA (ICD-434.91) Assessment: Unchanged  Her updated medication list for this problem includes:    Bayer Aspirin 325 Mg Tabs (Aspirin) .Marland Kitchen... Take 1 tablet by mouth once a day  Problem # 4:  RECTAL  BLEEDING (ICD-569.3) Assessment: Improved debnies any recent bleeding  Complete Medication List: 1)  Metoprolol Succinate 25 Mg Xr24h-tab (Metoprolol succinate) .... Take 1 tablet by mouth once a day 2)  Tramadol Hcl 50 Mg Tabs (Tramadol hcl) .... Take 1 tablet by mouth four times a day 3)  Baclofen 10 Mg Tabs (Baclofen) .... Take 1 tablet by mouth two times a day 4)  Leflunomide 20 Mg Tabs (Leflunomide) .... Take 1 tablet by mouth once a day 5)  Oxybutynin Chloride 5 Mg Tabs (Oxybutynin chloride) .... Take 1/2 tab two times a day 6)  Amlodipine Besylate 10 Mg Tabs (Amlodipine besylate) .... Take 1 tablet by mouth once a day 7)  Bayer Aspirin 325 Mg Tabs (Aspirin) .... Take 1 tablet by mouth once a day 8)  Ferrous Gluconate 240 (27 Fe) Mg Tabs (Ferrous gluconate) .... Take 1 tablet by mouth once a day 9)  Colace 50 Mg Caps (Docusate sodium) .... One tab by mouth bid 10)  Simvastatin 40 Mg Tabs (Simvastatin) .... One tab by mouth qhs 11)  Multivitamins Tabs (Multiple vitamin) .... Take 1 tablet by mouth once a day  Other Orders: Radiology Referral (Radiology)  Patient Instructions: 1)  Please schedule a follow-up appointment in 3 months. 2)  Lipid Panel prior to visit, ICD-9:  fasting today 3)  You  will be referred for a mamogram 4)  It is fine to resume therapy

## 2010-05-19 NOTE — Letter (Signed)
Summary: Plan of Care, Need to Discuss  [facility]  [address]   [place], [place] [address]   Phone: [phone]  Fax: [phone]    May 23, 2009  Danielle Cruz [address] [place], [place]  [address] 1956-02-26   Dear [name],   We are writing this letter to inform you of treatment plans and/or discuss your plan of care.  We have tried several times to contact you; however, we have yet to reach you.  We ask that you please contact our office for follow-up on your gastrointestinal issues.  We can  be reached at [phone] to schedule an appointment, or to speak with someone regarding your health care needs.  Please do not neglect your health.   Sincerely,    Hendricks Limes LPN  [facility] Ph: [phone]    Fax: [phone]

## 2010-05-19 NOTE — Assessment & Plan Note (Signed)
Summary: fatigue- room 1   Vital Signs:  Patient profile:   55 year old female Height:      67 inches Weight:      137.25 pounds BMI:     21.57 O2 Sat:      98 % on Room air Pulse rate:   81 / minute BP sitting:   130 / 70  (left arm)  Vitals Entered By: Adella Hare LPN (August 06, 2009 1:32 PM) CC: husband states patient has been slower and not as energetic, looks weak and right eye red Is Patient Diabetic? No Pain Assessment Patient in pain? no        Primary Provider:  Syliva Overman, MD  CC:  husband states patient has been slower and not as energetic and looks weak and right eye red.  History of Present Illness: Pt is here today with her husband.  He has noted her Rt eye to be red x 2-3 days.  Also with drainage & crursted in ams.  Per pt not itchy but is irritated or sore feeling.  She also has had some nasal congestion & cough.  Mostly clear or white nasal mucus,  and cough is nonproductive.  Using OTC cough med & does help. Husband has noticed that she doesn't have as much energy & seems a liitle weaker when trying to walk for the last couple of days since she developed this cold.  Hx of htn. Taking medications as prescribed. No headache, chest pain or palpitations. Pt had CVA approx 1 yr ago.  Is still going to speech therapy. No longer receiving PT or OT because was not improving.  Pt asks, thru her husband, when she is going to get use of her Rt arm back.  Pt missed her last appt with Dr Jena Gauss for f/u of pos FOB.  No abd pain.  Per husb appetite has been nl, BM's nl, and no blood in stool.     Allergies (verified): No Known Drug Allergies  Past History:  Past medical history reviewed for relevance to current acute and chronic problems.  Past Medical History: Reviewed history from 01/15/2009 and no changes required. Hyperlipidemia Arthritis Stroke x May 2010  left brain, residual expressive aphasia, Rue weakness, now has power in RLE and can ambulate  with a walker hTN diag approx 2005 CAD stents placed 20059[address]bart) 11/29/2008 colonoscopy:Normal rectum, Left-sided diverticula, pedunculated polyp mid sigmoid colon status post hot snare removal.  Remainder colonic mucosa appeared normal. suspect the sigmoid polyp has been producing hematochezia. hemmrhoid  Review of Systems General:  Denies chills and fever. Eyes:  Complains of discharge, eye irritation, and red eye. ENT:  Complains of nasal congestion; denies earache and sore throat. CV:  Denies chest pain or discomfort and palpitations. Resp:  Complains of cough and sputum productive; denies shortness of breath. GI:  Denies abdominal pain, bloody stools, change in bowel habits, constipation, and dark tarry stools.  Physical Exam  General:  Well-developed,well-nourished,in no acute distress; alert,appropriate and cooperative throughout examination Head:  Normocephalic and atraumatic without obvious abnormalities. No apparent alopecia or balding. Eyes:  pupils equal, pupils round, pupils reactive to light, and RT conjunctival injection.   Ears:  External ear exam shows no significant lesions or deformities.  Otoscopic examination reveals clear canals, tympanic membranes are intact bilaterally without bulging, retraction, inflammation or discharge. Hearing is grossly normal bilaterally. Nose:  no external deformity, no mucosal edema, mucosal erythema, R frontal sinus tenderness, and R maxillary sinus tenderness.  Mouth:  Oral mucosa and oropharynx without lesions or exudates.  Neck:  No deformities, masses, or tenderness noted. Lungs:  Normal respiratory effort, chest expands symmetrically. Lungs are clear to auscultation, no crackles or wheezes. Heart:  Normal rate and regular rhythm. S1 and S2 normal without gallop, murmur, click, rub or other extra sounds. Cervical Nodes:  No lymphadenopathy noted Psych:  good eye contact, not anxious appearing, not depressed appearing, and not  agitated.     Impression & Recommendations:  Problem # 1:  SINUSITIS, ACUTE (ICD-461.9) Assessment New  Her updated medication list for this problem includes:    Amoxicillin 875 Mg Tabs (Amoxicillin) .Marland Kitchen... Take 1 two times a day x 10 days  Problem # 2:  CONJUNCTIVITIS, ACUTE, RIGHT (ICD-372.00) Assessment: New Discussed freq handwashing to prevent transmission.  Her updated medication list for this problem includes:    Ciprofloxacin Hcl 0.3 % Soln (Ciprofloxacin hcl) ..... Instill 2 gtts rt eye three times a day x 7 days  Problem # 3:  HYPERTENSION (ICD-401.9) Assessment: Comment Only  Her updated medication list for this problem includes:    Metoprolol Succinate 25 Mg Xr24h-tab (Metoprolol succinate) .Marland Kitchen... Take 1 tablet by mouth once a day    Amlodipine Besylate 10 Mg Tabs (Amlodipine besylate) .Marland Kitchen... Take 1 tablet by mouth once a day  BP today: 130/70 Prior BP: 110/60 (05/01/2009)  Labs Reviewed: K+: 3.6 (12/12/2008) Creat: : 0.48 (12/12/2008)   Chol: 168 (05/01/2009)   HDL: 37 (05/01/2009)   LDL: 109 (05/01/2009)   TG: 111 (05/01/2009)  Orders: T-Comprehensive Metabolic Panel ([id number])  Problem # 4:  HYPERLIPIDEMIA (ICD-272.4) Assessment: Comment Only  Her updated medication list for this problem includes:    Simvastatin 40 Mg Tabs (Simvastatin) ..... One tab by mouth qhs  Labs Reviewed: SGOT: 22 (12/12/2008)   SGPT: 20 (12/12/2008)   HDL:37 (05/01/2009), 42 (12/12/2008)  LDL:109 (05/01/2009), 109 (12/12/2008)  Chol:168 (05/01/2009), 167 (12/12/2008)  Trig:111 (05/01/2009), 78 (12/12/2008)  Orders: T-Comprehensive Metabolic Panel ([id number]) T-Lipid Profile ([id number])  Complete Medication List: 1)  Metoprolol Succinate 25 Mg Xr24h-tab (Metoprolol succinate) .... Take 1 tablet by mouth once a day 2)  Tramadol Hcl 50 Mg Tabs (Tramadol hcl) .... Take 1 tablet by mouth four times a day 3)  Baclofen 10 Mg Tabs (Baclofen) .... Take 1 tablet by mouth two times  a day 4)  Leflunomide 20 Mg Tabs (Leflunomide) .... Take 1 tablet by mouth once a day 5)  Oxybutynin Chloride 5 Mg Tabs (Oxybutynin chloride) .... Take 1/2 tab two times a day 6)  Amlodipine Besylate 10 Mg Tabs (Amlodipine besylate) .... Take 1 tablet by mouth once a day 7)  Bayer Aspirin 325 Mg Tabs (Aspirin) .... Take 1 tablet by mouth once a day 8)  Ferrous Gluconate 240 (27 Fe) Mg Tabs (Ferrous gluconate) .... Take 1 tablet by mouth once a day 9)  Colace 50 Mg Caps (Docusate sodium) .... One tab by mouth bid 10)  Simvastatin 40 Mg Tabs (Simvastatin) .... One tab by mouth qhs 11)  Multivitamins Tabs (Multiple vitamin) .... Take 1 tablet by mouth once a day 12)  Amoxicillin 875 Mg Tabs (Amoxicillin) .... Take 1 two times a day x 10 days 13)  Ciprofloxacin Hcl 0.3 % Soln (Ciprofloxacin hcl) .... Instill 2 gtts rt eye three times a day x 7 days  Other Orders: T-CBC No Diff ([id number]) T-TSH ([id number]) T-Vitamin D (25-Hydroxy) ([id number])  Patient Instructions: 1)  Please schedule a follow-up appointment in 3 months. 2)  I have ordered blood work.  This needs to be drawn fasting. 3)  I have prescribed oral antibiotics and antibiotic eye drops for you to use. 4)  You may continue the over the counter cough medicine as needed. 5)  You need to contact Dr Luvenia Starch office for follow up.  You missed your last appt with him. Prescriptions: CIPROFLOXACIN HCL 0.3 % SOLN (CIPROFLOXACIN HCL) instill 2 gtts Rt eye three times a day x 7 days  #5 ml x 0   Entered and Authorized by:   Esperanza Sheets PA   Signed by:   Esperanza Sheets PA on 08/06/2009   Method used:   Electronically to        [facility] [phone]* (retail)       [address], [place]  [address]       Ph: [phone]       Fax: [phone]   RxID:   [id number] AMOXICILLIN 875 MG TABS (AMOXICILLIN) take 1 two times a day x 10 days  #20 x 0   Entered and Authorized by:   Esperanza Sheets PA   Signed by:   Esperanza Sheets PA on 08/06/2009   Method used:   Electronically to        [facility]. [address] [phone]* (retail)       [address] [place]  [address]       Ph: [phone]       Fax: [phone]   RxID:   [id number]

## 2010-05-19 NOTE — Letter (Signed)
Summary: Letter  Letter   Imported By: Lind Guest 05/08/2009 08:01:32  _____________________________________________________________________  External Attachment:    Type:   Image     Comment:   External Document

## 2010-05-19 NOTE — Progress Notes (Signed)
  Phone Note Call from Patient   Summary of Call: pt refused to have anything to do with the [facility] program and they tried to explain to her that they were there to help her and her husband and she showed them she could walk and didn't need them. Kept refusing care. She just wanted to let you know.  Initial call taken by: Everitt Amber LPN,  Danielle Cruz 21, 2011 3:06 PM     Appended Document:  noted

## 2010-05-19 NOTE — Letter (Signed)
Summary: CCME  CCME   Imported By: Lind Guest 10/02/2009 17:18:43  _____________________________________________________________________  External Attachment:    Type:   Image     Comment:   External Document

## 2010-05-19 NOTE — Assessment & Plan Note (Signed)
Summary: CRD/GU  pt returned ifobt and it was positive  Allergies: No Known Drug Allergies  Other Orders: Immuno-chemical Fecal Occult ([id number])  Appended Document: CRD/GU Please let pt know ifobt was positive for blood in stool. Last TCS few months ago. Basically H/H better and no ugi symptoms. Recommend OV with RMR in 2-3 weeks to discuss next step, ?EGD?   Appended Document: CRD/GU LM with son to return call  Appended Document: CRD/GU tried to call pt- NA  Appended Document: CRD/GU mailed pt letter  Appended Document: CRD/GU PT INFORMED OF RESULTS. APPT [facility] WITH RMR 06/25/09

## 2010-05-19 NOTE — Assessment & Plan Note (Signed)
Summary: follow up- room 3   Vital Signs:  Patient profile:   55 year old female Height:      67 inches Weight:      149.75 pounds BMI:     23.54 O2 Sat:      98 % on Room air Pulse rate:   75 / minute Resp:     16 per minute BP sitting:   110 / 68  (left arm)  Vitals Entered By: Adella Hare LPN (November 06, 2009 1:53 PM) CC: follow-up visit Is Patient Diabetic? No Pain Assessment Patient in pain? no      Comments did not bring meds to ov   Primary Provider:  Lodema Hong  CC:  follow-up visit.  History of Present Illness: Pt is here today for check up.  No current complaints or concerns. She is due in Aug for her yrly check up with Dr Dietrich Pates.  She is taking her blood pressure meds, and chol medication as prescribed. No chest pain, palpitations or HA. She did see Dr Jena Gauss regarding her rectal bleeding and constipation.  She is using her stool softeners.  BM's are nl now and no blood or melena. She started PT for her shoulder weakness yesterday.      Allergies (verified): No Known Drug Allergies  Past History:  Past medical history reviewed for relevance to current acute and chronic problems.  Past Medical History: Reviewed history from 01/15/2009 and no changes required. 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  Remainder colonic mucosa appeared normal. suspect the sigmoid polyp has been producing hematochezia. hemmrhoid  Review of Systems General:  Denies chills, fatigue, and fever. CV:  Denies chest pain or discomfort and palpitations. Resp:  Denies cough and shortness of breath. GI:  Denies abdominal pain, bloody stools, change in bowel habits, constipation, dark tarry  stools, diarrhea, loss of appetite, nausea, and vomiting. MS:  Complains of stiffness; denies joint pain. Neuro:  Complains of inability to speak and weakness; denies headaches.  Physical Exam  General:  alert, well-developed, well-nourished, and well-hydrated.   Head:  Normocephalic and atraumatic without obvious abnormalities. No apparent alopecia or balding. Ears:  External ear exam shows no significant lesions or deformities.  Otoscopic examination reveals clear canals, tympanic membranes are intact bilaterally without bulging, retraction, inflammation or discharge. Hearing is grossly normal bilaterally. Nose:  External nasal examination shows no deformity or inflammation. Nasal mucosa are pink and moist without lesions or exudates. Mouth:  Oral mucosa and oropharynx without lesions or exudates.  Neck:  No deformities, masses, or tenderness noted. Lungs:  Normal respiratory effort, chest expands symmetrically. Lungs are clear to auscultation, no crackles or wheezes. Heart:  Normal rate and regular rhythm. S1 and S2 normal without gallop, murmur, click, rub or other extra sounds. Cervical Nodes:  No lymphadenopathy noted Psych:  good eye contact, not anxious appearing, not depressed appearing, and not agitated.     Impression & Recommendations:  Problem # 1:  HYPERTENSION (ICD-401.9) Assessment Improved  Her updated medication list for this problem includes:    Metoprolol Succinate 25 Mg Xr24h-tab (Metoprolol succinate) .Marland Kitchen... Take 1 tablet by mouth once a day    Amlodipine Besylate 10 Mg Tabs (Amlodipine besylate) .Marland Kitchen... Take 1 tablet by mouth once a  day  Orders: T-Comprehensive Metabolic Panel ([id number])  BP today: 110/68 Prior BP: 130/70 (09/24/2009)  Labs Reviewed: K+: 4.5 (08/06/2009) Creat: : 0.54 (08/06/2009)   Chol: 170 (08/06/2009)   HDL: 37 (08/06/2009)   LDL: 102 (08/06/2009)   TG: 157 (08/06/2009)  Problem # 2:  HYPERLIPIDEMIA (ICD-272.4) Assessment: Comment  Only  Her updated medication list for this problem includes:    Simvastatin 40 Mg Tabs (Simvastatin) ..... 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 Take 1 tablet by mouth once a day  Complete Medication List: 1)  Metoprolol Succinate 25 Mg Xr24h-tab (Metoprolol succinate) .... Take 1 tablet by mouth once a day 2)  Tramadol Hcl 50 Mg Tabs (Tramadol hcl) .... Take 1 tablet by mouth four times a day 3)  Baclofen 10 Mg Tabs (Baclofen) .... Take 1 tablet by mouth two times a day 4)  Leflunomide 20 Mg Tabs (Leflunomide) .... Take 1 tablet by mouth once a day 5)  Oxybutynin Chloride 5 Mg Tabs (Oxybutynin chloride) .... Take 1/2 tab two times a day 6)  Amlodipine Besylate 10 Mg Tabs (Amlodipine besylate) .... Take 1 tablet by mouth once a day 7)  Bayer Aspirin 325 Mg Tabs (Aspirin) .... Take 1 tablet by mouth once a day 8)  Ferrous Gluconate 240 (27 Fe) Mg Tabs (Ferrous gluconate) .... Take 1 tablet by mouth once a day 9)  Colace 50 Mg Caps (Docusate sodium) .... One tab by mouth bid 10)  Simvastatin 40 Mg Tabs (Simvastatin) .... One tab by mouth qhs 11)  Multivitamins Tabs (Multiple vitamin) .... Take 1 tablet by mouth once a day  Patient Instructions: 1)  Please schedule a follow-up appointment in 4 months. 2)  Continue your current medications. 3)  CALL DR Langston Masker OFFICE FOR YEARLY CHECK UP APPT.  YOU ARE DUE IN AUGUST FOR THIS.

## 2010-05-19 NOTE — Letter (Signed)
Summary: HANDICAPP CARD  HANDICAPP CARD   Imported By: Lind Guest 05/01/2009 10:33:22  _____________________________________________________________________  External Attachment:    Type:   Image     Comment:   External Document

## 2010-05-19 NOTE — Letter (Signed)
Summary: [facility], [facility] at [employer]  [address] [place] [address]   Phone: [phone]  Fax: [phone]     January 14, 2010 MRN: [id number]   NOVIA LANSBERRY [address] [place], [place]  [address]      YOUR LAB WORK IS DUE   March 16, 2010  Please go to [facility], located across the street from [facility] on the second floor.  Hours are Monday - Friday 7am until 7:30pm         Saturday 8am until 12noon    _X_  DO NOT EAT OR DRINK AFTER MIDNIGHT EVENING PRIOR TO LABWORK  __ YOUR LABWORK IS NOT FASTING --YOU MAY EAT PRIOR TO LABWORK

## 2010-05-19 NOTE — Letter (Signed)
Summary: MEDICAL RELEASE  MEDICAL RELEASE   Imported By: Lind Guest 05/06/2009 09:34:07  _____________________________________________________________________  External Attachment:    Type:   Image     Comment:   External Document

## 2010-05-19 NOTE — Miscellaneous (Signed)
Summary: Home Care Report  Home Care Report   Imported By: Lind Guest 12/01/2009 09:15:24  _____________________________________________________________________  External Attachment:    Type:   Image     Comment:   External Document

## 2010-05-19 NOTE — Progress Notes (Signed)
Summary: MEDICATION  Phone Note Call from Patient   Summary of Call: PT JUST CALLED ABOUT REFILL STATED THAT HE JUST WENT TO [facility] AND IT WAS NOT FILLED... OXYBUTYNIN 5MG  Initial call taken by: Eugenio Hoes,  March 09, 2010 1:12 PM  Follow-up for Phone Call        this was sent twice to pharmacy Follow-up by: Adella Hare LPN,  March 10, 2010 6:23 PM

## 2010-05-19 NOTE — Assessment & Plan Note (Signed)
Summary: due for f/u per pt's husband/tg  Medications Added PRAVASTATIN SODIUM 80 MG TABS (PRAVASTATIN SODIUM) Take one tablet by mouth daily at bedtime VITAMIN D 400 UNIT CAPS (CHOLECALCIFEROL) take 1 tab daily      Allergies Added: NKDA  Visit Type:  Follow-up Primary [place]:  Syliva Overman MD   History of Present Illness: [name] returns to the office today as scheduled for continued assessment and treatment of coronary artery disease and cardiovascular risk factors.  Over the past year, she has continued to recover from a large left cerebral CVA with a long course of physical therapy, which she has just completed.  She also received occupational therapy, but now spends most of her time watching television.  She is able to walk slowly with a full right leg brace.  She denies dyspnea, orthopnea, PND or chest discomfort.  Current Medications (verified): 1)  Metoprolol Succinate 25 Mg Xr24h-Tab (Metoprolol Succinate) .... Take 1 Tablet By Mouth Once A Day 2)  Tramadol Hcl 50 Mg Tabs (Tramadol Hcl) .... Take 1 Tablet By Mouth Four Times A Day 3)  Leflunomide 20 Mg Tabs (Leflunomide) .... Take 1 Tablet By Mouth Once A Day 4)  Oxybutynin Chloride 5 Mg Tabs (Oxybutynin Chloride) .... Take 1/2 Tab Two Times A Day 5)  Amlodipine Besylate 10 Mg Tabs (Amlodipine Besylate) .... Take 1 Tablet By Mouth Once A Day 6)  Bayer Aspirin 325 Mg Tabs (Aspirin) .... Take 1 Tablet By Mouth Once A Day 7)  Ferrous Gluconate 240 (27 Fe) Mg Tabs (Ferrous Gluconate) .... Take 1 Tablet By Mouth Once A Day 8)  Benzonatate 100 Mg Caps (Benzonatate) .... Take 1 Every 8 Hrs As Needed For Cough 9)  Pravastatin Sodium 80 Mg Tabs (Pravastatin Sodium) .... Take One Tablet By Mouth Daily At Bedtime 10)  Vitamin D 400 Unit Caps (Cholecalciferol) .... Take 1 Tab Daily  Allergies (verified): No Known Drug Allergies  Comments:  Nurse/Medical Assistant: patient brought med list also reviewed last office  visit  Past History:  PMH, FH, and Social History reviewed and updated.  Past Medical History: ASCVD: DES to the LAD and the BMS to the OM1 in 07/2003; normal EF Hyperlipidemia Hypertension-onset in 2005 Tobacco abuse-quit in 2010; 30 pack-year history Cerebrovascular disease: Sizable left cerebral CVA in 08/2008- residual expressive aphasia, right-sided      weakness; ambulates with difficulty with the right leg brace Rheumatoid Arthritis 11/29/2008 colonoscopy:Normal rectum, Left-sided diverticula, pedunculated polyp mid sigmoid colon status post hot snare removal.  Remainder colonic mucosa appeared normal. suspect the sigmoid polyp has been producing hematochezia. Hemorrhoids  Past Surgical History: Right Ankle Surgery Ovaries removed Colonoscopy-2010  Social History: Occupation: disabilityb x 50yrs secondary to arthritis Married x 13 yrs, togetther for 33 Smoked x 35 yrs 1 PPD quit 2 months ago following a stroke Alcohol use-quit x 6 yrs. Drug use-no Regular exercise-no 4 births, 1 deceased at 5 weeks was a premature baby, 3 living age range 82 to 70 all healthy  Review of Systems       See history of present illness  Vital Signs:  Patient profile:   55 year old female Weight:      151 pounds BMI:     23.74 Pulse rate:   81 / minute BP sitting:   120 / 76  (right arm)  Vitals Entered By: Dreama Saa, CNA (January 14, 2010 11:41 AM)  Physical Exam  General:  Passionate weight and height; well developed; no acute  distress:   Neck-No JVD; no carotid bruits; normal carotid upstrokes Lungs-No tachypnea, no rales; no rhonchi; no wheezes; decreased breath sounds at the left base: Cardiovascular-normal PMI; splitl S1 and normal S2: Abdomen-BS normal; soft and non-tender without masses or organomegaly:  Musculoskeletal-No deformities, no cyanosis or clubbing: Neurologic-left central seventh nerve palsy; grade 4/5 strength on the right; expressive aphasia  Skin-Warm, no  significant lesions: Extremities-Nl distal pulses;1/2+ edema:     Impression & Recommendations:  Problem # 1:  ATHEROSCLEROTIC CARDIOVASCULAR DISEASE (ICD-429.2) Patient reports no symptoms to suggest new or recurrent ischemia.  Problem # 2:  HYPERTENSION (ICD-401.9) Blood pressure control is excellent; current medications will be continued.  Problem # 3:  HYPERLIPIDEMIA (ICD-272.4) Lipid control was fairly good; however, this dose of simvastatin is not permitted in combination with amlodipine.  Pravastatin 80 mg q.d. will be substituted and a repeat lipid profile and chemistry profile obtained in 2 months.  CHOL: 177 (11/13/2009)   LDL: 112 (11/13/2009)   HDL: 41 (11/13/2009)   TG: 121 (11/13/2009)  I will plan to see this nice woman again in one year.  Other Orders: T-Chest x-ray, 2 views ([address]) Future Orders: T-Comprehensive Metabolic Panel ([id number]) ... 03/16/2010 T-Lipid Profile [id number]) ... 03/16/2010  Patient Instructions: 1)  Your physician recommends that you schedule a follow-up appointment in: 1 year 2)  Your physician recommends that you return for lab work in: 3)  Your physician has recommended you make the following change in your medication: stop simvastatin and start pravstatin 80mg  daily 4)  A chest x-ray takes a picture of the organs and structures inside the chest, including the heart, lungs, and blood vessels. This test can show several things, including, whether the heart is enlarged; whether fluid is building up in the lungs; and whether pacemaker / defibrillator leads are still in place. Prescriptions: PRAVASTATIN SODIUM 80 MG TABS (PRAVASTATIN SODIUM) Take one tablet by mouth daily at bedtime  #30 x 6   Entered by:   Teressa Lower RN   Authorized by:   Kathlen Brunswick, MD, [facility]   Signed by:   Teressa Lower RN on 01/14/2010   Method used:   Electronically to        [facility]. [phone]* (retail)       [address], [place]  [address]       Ph: [phone]       Fax: [phone]   RxID:   [id number]

## 2010-05-19 NOTE — Letter (Signed)
Summary: referral  referral   Imported By: Lind Guest 11/03/2009 09:44:07  _____________________________________________________________________  External Attachment:    Type:   Image     Comment:   External Document

## 2010-05-19 NOTE — Assessment & Plan Note (Signed)
Summary: BLOOD IN STOOL/SS   Visit Type:  Follow-up Visit Primary Care Provider:  Lodema Hong  Chief Complaint:  F/U  blood in stool.  History of Present Illness: 55 year old lady with stroke, intermittent hematochezia felt to be in part secondary to a polyp previously removed and hemorrhoids. She's no longer passing any blood per rectum she previously was mildly anemic. Normal hemoglobin and hematocrit through  Dr. Anthony Sar office recently. Patient denies constipation ; she denies abdominal pain, nausea or vomiting. Per husband, pt not having blood in stool now, just here to follow up. Doing well.   Current Medications (verified): 1)  Metoprolol Succinate 25 Mg Xr24h-Tab (Metoprolol Succinate) .... Take 1 Tablet By Mouth Once A Day 2)  Tramadol Hcl 50 Mg Tabs (Tramadol Hcl) .... Take 1 Tablet By Mouth Four Times A Day 3)  Baclofen 10 Mg Tabs (Baclofen) .... Take 1 Tablet By Mouth Two Times A Day 4)  Leflunomide 20 Mg Tabs (Leflunomide) .... Take 1 Tablet By Mouth Once A Day 5)  Oxybutynin Chloride 5 Mg Tabs (Oxybutynin Chloride) .... Take 1/2 Tab Two Times A Day 6)  Amlodipine Besylate 10 Mg Tabs (Amlodipine Besylate) .... Take 1 Tablet By Mouth Once A Day 7)  Bayer Aspirin 325 Mg Tabs (Aspirin) .... Take 1 Tablet By Mouth Once A Day 8)  Ferrous Gluconate 240 (27 Fe) Mg Tabs (Ferrous Gluconate) .... Take 1 Tablet By Mouth Once A Day 9)  Colace 50 Mg Caps (Docusate Sodium) .... One Tab By Mouth Bid 10)  Simvastatin 40 Mg Tabs (Simvastatin) .... One Tab By Mouth Qhs 11)  Multivitamins  Tabs (Multiple Vitamin) .... Take 1 Tablet By Mouth Once A Day 12)  Amoxicillin 875 Mg Tabs (Amoxicillin) .... 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  Remainder colonic mucosa appeared normal. suspect the sigmoid polyp has been producing hematochezia. hemmrhoid  Past Surgical History: Last updated: December 01, 2008 Right Ankle Surgery Ovaries removed  Family History: Last updated: 01-Dec-2008 Mother-Deceased-unknown, age 65 Father-Deceased-Colon Cancer, 70's 6 sisters-1 deceased from brain cancer died in her 9's 5 brothers-1 brother has cancer in remission                   1 deceased from drowning  Social History: Last updated: 12/01/08 Occupation: disabilityb x 50yrs secondary to arthritis Married x 13 yrs, togetther for 33  Smoked x 35 yrs 1 PPD quit 2 months ago follopwing a stroke Alcohol use-quit x 6 yrs. Drug use-no Regular exercise-no 4 births, 1 deceased at 5 weeks was a premature baby, 3 living age range 31 to 66 all healthy  Vital Signs:  Patient profile:   55 year old female Height:      67 inches Weight:      143 pounds BMI:     22.48 Temp:     98.6 degrees F oral Pulse rate:   80 / minute BP sitting:   110 / 68  (left arm) Cuff size:   regular  Vitals Entered By: Cloria Spring LPN (August 15, 2009 8:52 AM)  Physical Exam  General:  alert conversant. By her  husband. She has significant speech difficulties persisting, however. Lungs:  clear to auscultation Heart:  regular rate and rhythm without murmur gallop rub Abdomen:  flat positive bowel sounds soft nontender without appreciable mass or organomegaly  Impression & Recommendations: Impression: Hematochezia secondary to hemorrhoids possibly intermittant bleed ing from a benign polyp removed last year. At any rate, she is devoid of any GI tract symptoms at this time.  Recommendations: Avoid constipation. No further GI evaluation warranted at this time. I'll be happy  to see in the future anytime should problems arise.  Appended Document: Orders Update    Clinical Lists Changes  Orders: Added new Service order of Est. Patient Level III (36644) - Signed

## 2010-05-19 NOTE — Miscellaneous (Signed)
Summary: Rehab Report  Rehab Report   Imported By: Lind Guest 09/23/2009 10:21:43  _____________________________________________________________________  External Attachment:    Type:   Image     Comment:   External Document

## 2010-05-19 NOTE — Assessment & Plan Note (Signed)
Summary: OV   Vital Signs:  Patient profile:   55 year old female Height:      67 inches Weight:      145.25 pounds BMI:     22.83 O2 Sat:      97 % Pulse rate:   75 / minute Pulse rhythm:   regular Resp:     16 per minute BP sitting:   130 / 70  (left arm) Cuff size:   regular  Vitals Entered By: Everitt Amber LPN ([place]  8, 1610 3:41 PM) CC: has a rash that started under her neck and now its on her right arm. Keeps having styes in her eyes   Primary Care Provider:  Lodema Hong  CC:  has a rash that started under her neck and now its on her right arm. Keeps having styes in her eyes.  History of Present Illness: c/o rash on neck and under right armpit x 2 weeks.  Painless swelling under right eyelid was initially on left eye, also concern about poor vision.  Requesting sitter asitance for up to 4 hrs/day, needs help with meals, and safety  Explained the need to follow through on speech therapy and spouse says he will see to it.  Allergies: No Known Drug Allergies  Review of Systems      See HPI General:  Complains of fatigue and weakness; denies chills and fever. Eyes:  Complains of vision loss-both eyes. ENT:  Denies hoarseness, nasal congestion, and sinus pressure. CV:  Denies chest pain or discomfort, difficulty breathing while lying down, palpitations, and swelling of feet. Resp:  Denies cough and sputum productive. GI:  Denies abdominal pain, constipation, diarrhea, nausea, and vomiting. GU:  Denies dysuria and urinary frequency. MS:  Complains of joint pain, loss of strength, muscle weakness, and stiffness. Derm:  Complains of itching and rash. Neuro:  Complains of inability to speak, poor balance, and weakness. Psych:  Denies anxiety and depression. Endo:  Denies cold intolerance, excessive hunger, excessive thirst, excessive urination, heat intolerance, polyuria, and weight change. Heme:  Denies abnormal bruising and bleeding. Allergy:  Denies hives or rash and  itching eyes.  Physical Exam  General:  Well-developed,,in no acute distress; alert,appt has expressive aphasia, and hemiparesis following  a CVA, she is unable to stand. HEENT: No facial asymmetry, stye over right eye EOMI, No sinus tenderness, TM's Clear, oropharynx  pink and moist. poor dfentition  Chest: Clear to auscultation bilaterally.  CVS: S1, S2, No murmurs, No S3.   Abd: Soft, Nontender.  MS: Adequate ROM spine, hips, shoulders and knees.  Ext: No edema.   CNS: CN 2-12 intact,reduced  power tone and sensation on right upper and lower ext  Skin: Intact,fungal rash under axilla Psych: Good eye contact, normal affect.   not anxious or depressed appearing.    Impression & Recommendations:  Problem # 1:  STYE (ICD-373.11) Assessment Comment Only  Future Orders: Ophthalmology Referral (Ophthalmology) ... 09/25/2009  Problem # 2:  HYPERTENSION (ICD-401.9) Assessment: Unchanged  Her updated medication list for this problem includes:    Metoprolol Succinate 25 Mg Xr24h-tab (Metoprolol succinate) .Marland Kitchen... Take 1 tablet by mouth once a day    Amlodipine Besylate 10 Mg Tabs (Amlodipine besylate) .Marland Kitchen... Take 1 tablet by mouth once a day  BP today: 130/70 Prior BP: 110/68 (08/15/2009)  Labs Reviewed: K+: 4.5 (08/06/2009) Creat: : 0.54 (08/06/2009)   Chol: 170 (08/06/2009)   HDL: 37 (08/06/2009)   LDL: 102 (08/06/2009)   TG: 157 (08/06/2009)  Problem # 3:  HYPERLIPIDEMIA (ICD-272.4) Assessment: Unchanged  Her updated medication list for this problem includes:    Simvastatin 40 Mg Tabs (Simvastatin) ..... One tab by mouth qhs  Labs Reviewed: SGOT: 18 (08/06/2009)   SGPT: 16 (08/06/2009)   HDL:37 (08/06/2009), 37 (05/01/2009)  LDL:102 (08/06/2009), 109 (05/01/2009)  Chol:170 (08/06/2009), 168 (05/01/2009)  Trig:157 (08/06/2009), 111 (05/01/2009)  Problem # 4:  CVA (ICD-434.91) Assessment: Comment Only  Her updated medication list for this problem includes:    Bayer Aspirin  325 Mg Tabs (Aspirin) .Marland Kitchen... 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 Take 1 tablet by mouth once a day  Problem # 6:  APHASIA (ICD-784.3) Assessment: Unchanged pt and spouse encouraged to get on a reg sched of speech therapy at home  Problem # 7:  ONYCHOMYCOSIS (ICD-110.1) Assessment: Comment Only  Her updated medication list for this problem includes:    Lamisil At 1 % Crea (Terbinafine hcl) .Marland Kitchen... Apply thrre time dailly to rash under neck and in right armpit  Complete Medication List: 1)  Metoprolol Succinate 25 Mg Xr24h-tab (Metoprolol succinate) .... Take 1 tablet by mouth once a day 2)  Tramadol Hcl 50 Mg Tabs (Tramadol hcl) .... Take 1 tablet by mouth four times a day 3)  Baclofen 10 Mg Tabs (Baclofen) .... Take 1 tablet by mouth two times a day 4)  Leflunomide 20 Mg Tabs (Leflunomide) .... Take 1 tablet by mouth once a day 5)  Oxybutynin Chloride 5 Mg Tabs (Oxybutynin chloride) .... Take 1/2 tab two times a day 6)  Amlodipine Besylate 10 Mg Tabs (Amlodipine besylate) .... Take 1 tablet by mouth once a day 7)  Bayer Aspirin 325 Mg Tabs (Aspirin) .... Take 1 tablet by mouth once a day 8)  Ferrous Gluconate 240 (27 Fe) Mg Tabs (Ferrous gluconate) .... Take 1 tablet by mouth once a day 9)  Colace 50 Mg Caps (Docusate sodium) .... One tab by mouth bid 10)  Simvastatin 40 Mg Tabs (Simvastatin) .... One tab by mouth qhs 11)  Multivitamins Tabs (Multiple vitamin) .... Take 1 tablet by mouth once a day 12)  Lamisil At 1 % Crea (Terbinafine hcl) .... Apply thrre time dailly to rash under neck and in right armpit  Patient Instructions: 1)  Please schedule a follow-up appointment in 3 months. 2)  A cream is sent in for the rash, and I am referring you to an eye doctor. 3)  It is vital that you do  your therapy exercises. 4)  We will try to get help at home for you. Prescriptions: LAMISIL AT 1 % CREA (TERBINAFINE HCL) apply thrre time dailly to rash under neck and in right armpit  #45 gm x 2   Entered and Authorized by:   Syliva Overman MD   Signed by:   Syliva Overman MD on 09/24/2009   Method used:   Electronically to        [facility] [address] [phone]* (retail)       [address] [place]  [address]       Ph: [phone]       Fax: [phone]   RxID:   [id number]

## 2010-05-19 NOTE — Miscellaneous (Signed)
Summary: Rehab Report  Rehab Report   Imported By: Lind Guest 10/15/2009 11:19:31  _____________________________________________________________________  External Attachment:    Type:   Image     Comment:   External Document

## 2010-05-19 NOTE — Assessment & Plan Note (Signed)
Summary: office visit   Vital Signs:  Patient profile:   55 year old female Height:      67 inches O2 Sat:      93 % Pulse rate:   76 / minute Resp:     16 per minute BP sitting:   110 / 70  (left arm) Cuff size:   regular  Vitals Entered By: Everitt Amber LPN CC: Follow up visit   Primary Provider:  Lodema Hong  CC:  Follow up visit.  History of Present Illness: Pt presents today for check up. Overall doing well. Little cough x 1 week. Nonprod but worse at night. No over the counter meds.  Also scratchy throat. No nasal congestion.  No fever or chills.  Hx of htn, CAD, and CVA. Taking medications as prescribed and denies side effects.  No headache, chest pain or palpitations. Has appt with Dr Dietrich Pates end of this mos.  Saw Ortho in July for RA and Rt shoulder pain.  Went to PT. Is still having pain in her shoulder.  F/u appt Jan 2012.    Allergies (verified): No Known Drug Allergies  Past History:  Past medical history reviewed for relevance to current acute and chronic problems.  Past Medical History: Hyperlipidemia Rheumatoid Arthritis Stroke x May 2010  left brain, residual expressive aphasia, Rue weakness, now has power in RLE and can ambulate with a walker hTN diag approx 2005 CAD stents placed [id number]) 11/29/2008 colonoscopy:Normal rectum, Left-sided diverticula, pedunculated polyp mid sigmoid colon status post hot snare removal.  Remainder colonic mucosa appeared normal. suspect the sigmoid polyp has been producing hematochezia. hemmrhoid  Review of Systems General:  Denies chills and fever. ENT:  Denies earache, nasal congestion, sinus pressure, and sore throat. CV:  Denies chest pain or discomfort, lightheadness, and palpitations. Resp:  Complains of cough; denies shortness of breath and sputum productive. Neuro:  Denies headaches.  Physical Exam  General:  Well-developed,well-nourished,in no acute distress; alert,appropriate and cooperative  throughout examination Head:  Normocephalic and atraumatic without obvious abnormalities. No apparent alopecia or balding. Ears:  External ear exam shows no significant lesions or deformities.  Otoscopic examination reveals clear canals, tympanic membranes are intact bilaterally without bulging, retraction, inflammation or discharge. Hearing is grossly normal bilaterally. Nose:  External nasal examination shows no deformity or inflammation. Nasal mucosa are pink and moist without lesions or exudates. Mouth:  Oral mucosa and oropharynx without lesions or exudates.  Neck:  No deformities, masses, or tenderness noted. Lungs:  Normal respiratory effort, chest expands symmetrically. Lungs are clear to auscultation, no crackles or wheezes. Heart:  Normal rate and regular rhythm. S1 and S2 normal without gallop, murmur, click, rub or other extra sounds. Cervical Nodes:  No lymphadenopathy noted Psych:  good eye contact, not anxious appearing, and not depressed appearing.     Impression & Recommendations:  Problem # 1:  HYPERLIPIDEMIA (ICD-272.4) Assessment Comment Only  Her updated medication list for this problem includes:    Crestor 10 Mg Tabs (Rosuvastatin calcium) .Marland Kitchen... Take 1 daily for cholesterol  Labs Reviewed: SGOT: 20 (11/13/2009)   SGPT: 21 (11/13/2009)   HDL:41 (11/13/2009), 37 (08/06/2009)  LDL:112 (11/13/2009), 102 (08/06/2009)  Chol:177 (11/13/2009), 170 (08/06/2009)  Trig:121 (11/13/2009), 157 (08/06/2009)  Orders: T-Comprehensive Metabolic Panel ([id number]) T-Lipid Profile ([id number])  Problem # 2:  HYPERTENSION (ICD-401.9) Assessment: Comment Only  Her updated medication list for this problem includes:    Metoprolol Succinate 25 Mg Xr24h-tab (Metoprolol succinate) .Marland Kitchen... Take 1 tablet by mouth once a  day    Amlodipine Besylate 10 Mg Tabs (Amlodipine besylate) .Marland Kitchen... Take 1 tablet by mouth once a day  BP today: 110/70 Prior BP: 110/68 (11/06/2009)  Labs Reviewed: K+:  4.2 (11/13/2009) Creat: : 0.54 (11/13/2009)   Chol: 177 (11/13/2009)   HDL: 41 (11/13/2009)   LDL: 112 (11/13/2009)   TG: 121 (11/13/2009)  Orders: T-Comprehensive Metabolic Panel ([id number])  Problem # 3:  COUGH (ICD-786.2) Assessment: New  Problem # 4:  SHOULDER PAIN, RIGHT, CHRONIC (ICD-719.41) Assessment: Comment Only Advised to f/u with ortho regarding this problem.  Her updated medication list for this problem includes:    Tramadol Hcl 50 Mg Tabs (Tramadol hcl) .Marland Kitchen... Take 1 tablet by mouth four times a day    Baclofen 10 Mg Tabs (Baclofen) .Marland Kitchen... Take 1 tablet by mouth two times a day    Bayer Aspirin 325 Mg Tabs (Aspirin) .Marland Kitchen... Take 1 tablet by mouth once a day  Complete Medication List: 1)  Metoprolol Succinate 25 Mg Xr24h-tab (Metoprolol succinate) .... Take 1 tablet by mouth once a day 2)  Tramadol Hcl 50 Mg Tabs (Tramadol hcl) .... Take 1 tablet by mouth four times a day 3)  Baclofen 10 Mg Tabs (Baclofen) .... Take 1 tablet by mouth two times a day 4)  Leflunomide 20 Mg Tabs (Leflunomide) .... Take 1 tablet by mouth once a day 5)  Oxybutynin Chloride 5 Mg Tabs (Oxybutynin chloride) .... Take 1/2 tab two times a day 6)  Amlodipine Besylate 10 Mg Tabs (Amlodipine besylate) .... Take 1 tablet by mouth once a day 7)  Bayer Aspirin 325 Mg Tabs (Aspirin) .... Take 1 tablet by mouth once a day 8)  Ferrous Gluconate 240 (27 Fe) Mg Tabs (Ferrous gluconate) .... Take 1 tablet by mouth once a day 9)  Multivitamins Tabs (Multiple vitamin) .... Take 1 tablet by mouth once a day 10)  Crestor 10 Mg Tabs (Rosuvastatin calcium) .... Take 1 daily for cholesterol 11)  Benzonatate 100 Mg Caps (Benzonatate) .... Take 1 every 8 hrs as needed for cough 12)  Azithromycin 250 Mg Tabs (Azithromycin) .... Take 2 tabs today, then 1 daily x 4 days  Other Orders: T-CBC No Diff ([id number]) Influenza Vaccine MCR ([id number])  Patient Instructions: 1)  Please schedule a follow-up appointment in 3  months. 2)  Continue your current medications. 3)  Keep your upcoming appt with Dr Dietrich Pates. 4)  Your blood work is due in October.  Have this drawn fasting. 5)  I have prescribed an antibiotic and cough medicine. Prescriptions: AZITHROMYCIN 250 MG TABS (AZITHROMYCIN) take 2 tabs today, then 1 daily x 4 days  #6 x 0   Entered and Authorized by:   Esperanza Sheets PA   Signed by:   Esperanza Sheets PA on 01/01/2010   Method used:   Electronically to        [facility] [phone]* (retail)       [address], [place]  [address]       Ph: [phone]       Fax: [phone]   RxID:   [id number] BENZONATATE 100 MG CAPS (BENZONATATE) take 1 every 8 hrs as needed for cough  #30 x 0   Entered and Authorized by:   Esperanza Sheets PA   Signed by:   Esperanza Sheets PA on 01/01/2010   Method used:   Electronically to        [facility] [phone]* (retail)  [address] [place], [place]  [address]       Ph: [phone]       Fax: [phone]   RxID:   [id number]    Influenza Vaccine    Vaccine Type: Fluvax MCR    Site: left deltoid    Mfr: novartis    Dose: 0.5 ml    Route: IM    Given by: Everitt Amber LPN    Exp. Date: 08/2010    Lot #: [id number]

## 2010-05-19 NOTE — Miscellaneous (Signed)
Summary: LABS CMP,LIPIDS,11/13/2009  Clinical Lists Changes  Observations: Added new observation of CALCIUM: 9.4 mg/dL (16/01/9603 54:09) Added new observation of ALBUMIN: 4.2 g/dL (81/19/1478 29:56) Added new observation of PROTEIN, TOT: 7.8 g/dL (21/30/8657 84:69) Added new observation of SGPT (ALT): 21 units/L (10/14/2008 11:00) Added new observation of SGOT (AST): 20 units/L (10/14/2008 11:00) Added new observation of ALK PHOS: 73 units/L (10/14/2008 11:00) Added new observation of CREATININE: 0.54 mg/dL (62/95/2841 32:44) Added new observation of BUN: 7 mg/dL (04/21/7251 66:44) Added new observation of BG RANDOM: 85 mg/dL (03/47/4259 56:38) Added new observation of CO2 PLSM/SER: 22 meq/L (10/14/2008 11:00) Added new observation of CL SERUM: 106 meq/L (10/14/2008 11:00) Added new observation of K SERUM: 4.2 meq/L (10/14/2008 11:00) Added new observation of NA: 142 meq/L (10/14/2008 11:00) Added new observation of LDL: 112 mg/dL (75/64/3329 51:88) Added new observation of HDL: 41 mg/dL (41/66/0630 16:01) Added new observation of TRIGLYC TOT: 121 mg/dL (09/32/3557 32:20) Added new observation of CHOLESTEROL: 177 mg/dL (25/42/7062 37:62)

## 2010-05-19 NOTE — Miscellaneous (Signed)
Summary: LABS CMP,LIPIDS,12/12/2008  Clinical Lists Changes  Observations: Added new observation of CALCIUM: 9.8 mg/dL (16/01/9603 54:09) Added new observation of ALBUMIN: 4.2 g/dL (81/19/1478 29:56) Added new observation of PROTEIN, TOT: 8.0 g/dL (21/30/8657 84:69) Added new observation of SGPT (ALT): 20 units/L (12/12/2008 11:03) Added new observation of SGOT (AST): 22 units/L (12/12/2008 11:03) Added new observation of ALK PHOS: 79 units/L (12/12/2008 11:03) Added new observation of CREATININE: 0.48 mg/dL (62/95/2841 32:44) Added new observation of BUN: 10 mg/dL (04/21/7251 66:44) Added new observation of BG RANDOM: 88 mg/dL (03/47/4259 56:38) Added new observation of CO2 PLSM/SER: 25 meq/L (12/12/2008 11:03) Added new observation of CL SERUM: 104 meq/L (12/12/2008 11:03) Added new observation of K SERUM: 3.6 meq/L (12/12/2008 11:03) Added new observation of NA: 140 meq/L (12/12/2008 11:03) Added new observation of LDL: 109 mg/dL (75/64/3329 51:88) Added new observation of HDL: 42 mg/dL (41/66/0630 16:01) Added new observation of TRIGLYC TOT: 78 mg/dL (09/32/3557 32:20) Added new observation of CHOLESTEROL: 167 mg/dL (25/42/7062 37:62)

## 2010-05-19 NOTE — Progress Notes (Signed)
Summary: power chair  Phone Note Other Incoming   Summary of Call: When I spoke to Danielle Cruz about Danielle Cruz's ENT appt he also asked could we set her up an appt. to get a power chair. Initial call taken by: Curtis Sites,  March 05, 2010 10:02 AM  Follow-up for Phone Call        i have put in a referral for physical therapy to evaluate her, AFTER THIS IS COMPLETED WE WILL GIVE HER AN AOPPT HERE IF THEY SAY SHE QUALIFIES Follow-up by: Syliva Overman MD,  March 05, 2010 12:19 PM  Additional Follow-up for Phone Call Additional follow up Details #1::        gave husband the above information.  He said thanks for woking on it. Additional Follow-up by: Curtis Sites,  March 06, 2010 10:01 AM

## 2010-05-19 NOTE — Miscellaneous (Signed)
Summary: Rehab Report  Rehab Report   Imported By: Lind Guest 11/07/2009 15:18:13  _____________________________________________________________________  External Attachment:    Type:   Image     Comment:   External Document

## 2010-05-19 NOTE — Assessment & Plan Note (Signed)
Summary: office visit   Vital Signs:  Patient profile:   55 year old female Height:      67 inches Weight:      151 pounds BMI:     23.74 O2 Sat:      98 % on Room air Pulse rate:   79 / minute Pulse rhythm:   regular Resp:     16 per minute BP sitting:   128 / 70  (left arm)  Vitals Entered By: Adella Hare LPN (March 09, 2010 11:23 AM)  O2 Flow:  Room air CC: follow-up visit Is Patient Diabetic? No Pain Assessment Patient in pain? no      Comments did not bring meds to ov   Primary Care Provider:  Syliva Overman MD  CC:  follow-up visit.  History of Present Illness: pt in for f/u. spouse reports she needs a power wheelchair, the black spots on her tongue reported appear to be caffeine stains so this will be cancelled, still wantsthte pWC. needs flu vaccine. The pt has had a srtroke which has left her with expressie aphasi,a and upper andlower body weakness, requiring use of a wheelchair most of the time for mobilityas well as safety in her activities of daily living even in the home her mobility is severely compromsed,she is in a wheelchair 95% of her awaake  time, andbecause of upper extremity weakness, she is less mobile than she would wish.  Allergies (verified): No Known Drug Allergies  Review of Systems      See HPI General:  Complains of fatigue and weakness. Eyes:  Denies blurring, discharge, eye pain, and red eye. ENT:  Denies hoarseness, nasal congestion, and sinus pressure. CV:  Denies chest pain or discomfort, palpitations, and swelling of feet. Resp:  Denies cough and sputum productive. GI:  Denies abdominal pain, constipation, diarrhea, nausea, and vomiting. GU:  Complains of incontinence; denies dysuria and urinary frequency; c/o imncreased urinary incontinence. MS:  Complains of loss of strength and muscle weakness. Derm:  Complains of changes in color of skin; coffee staining on the tongue. Neuro:  Complains of inability to speak, poor  balance, and weakness. Psych:  Denies anxiety and depression. Endo:  Denies cold intolerance, excessive hunger, excessive thirst, excessive urination, and heat intolerance. Heme:  Denies abnormal bruising, bleeding, and enlarge lymph nodes. Allergy:  Denies hives or rash and seasonal allergies.  Physical Exam  General:  Well-developed,adequately l-nourished,in no acute distress; alert,appropriate and cooperative throughout examination. Pt has expressive aphasia .Pt is wheelchir bound. HEENT: No facial asymmetry,  EOMI, No sinus tenderness, TM's Clear, oropharynx  pink and moist.   Chest: Clear to auscultation bilaterally.  CVS: S1, S2, No murmurs, No S3.   Abd: Soft, Nontender.  ZO:[id number]  ROM spine, hips, shoulders and knees.  Ext: No edema.   CNS: CN 2-12 intact, reduced power and  tone in upper and lower extremeties.  Skin: Intact, no visible lesions or rashes.  Psych: Good eye contact, normal affect.  Memory unable to asssnot anxious or depressed appearing.    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 Take 1 tablet by mouth once a day  Problem # 2:  HYPERTENSION (ICD-401.9) Assessment: Unchanged  Her updated medication list for this problem includes:    Metoprolol Succinate 25 Mg Xr24h-tab (Metoprolol succinate) .Marland Kitchen... Take 1 tablet by mouth once a day    Amlodipine Besylate 10 Mg Tabs (Amlodipine besylate) .Marland Kitchen... Take 1 tablet by mouth once a day  Orders: T-Basic Metabolic Panel [id number])  BP today: 128/70 Prior BP: 120/76 (01/14/2010)  Labs Reviewed: K+: 4.3 (01/19/2010) Creat: : 0.55 (01/19/2010)   Chol: 165 (01/19/2010)    HDL: 31 (01/19/2010)   LDL: 101 (01/19/2010)   TG: 166 (01/19/2010)  Problem # 3:  HYPERLIPIDEMIA (ICD-272.4) Assessment: Comment Only  Her updated medication list for this problem includes:    Pravastatin Sodium 80 Mg Tabs (Pravastatin sodium) .Marland Kitchen... Take one tablet by mouth daily at bedtime Low fat dietdiscussed and encouraged lDL not at goal Orders: T-Lipid Profile ([id number]) T-Hepatic Function [phone])  Labs Reviewed: SGOT: 26 (01/19/2010)   SGPT: 25 (01/19/2010)   HDL:31 (01/19/2010), 41 (11/13/2009)  LDL:101 (01/19/2010), 112 (11/13/2009)  Chol:165 (01/19/2010), 177 (11/13/2009)  Trig:166 (01/19/2010), 121 (11/13/2009)  Problem # 4:  ATHEROSCLEROTIC CARDIOVASCULAR DISEASE (ICD-429.2) Assessment: Comment Only  Her updated medication list for this problem includes:    Metoprolol Succinate 25 Mg Xr24h-tab (Metoprolol succinate) .Marland Kitchen... Take 1 tablet by mouth once a day    Amlodipine Besylate 10 Mg Tabs (Amlodipine besylate) .Marland Kitchen... Take 1 tablet by mouth once a day    Bayer Aspirin 325 Mg Tabs (Aspirin) .Marland Kitchen... Take 1 tablet by mouth once a day recently evaluated by card, no changes made  Complete Medication List: 1)  Metoprolol Succinate 25 Mg Xr24h-tab (Metoprolol succinate) .... Take 1 tablet by mouth once a day 2)  Tramadol Hcl 50 Mg Tabs (Tramadol hcl) .... Take 1 tablet by mouth four times a day 3)  Leflunomide 20 Mg Tabs (Leflunomide) .... Take 1 tablet by mouth once a day 4)  Oxybutynin Chloride 5 Mg Tabs (Oxybutynin chloride) .... Take 1/2 tab two times a day 5)  Amlodipine Besylate 10 Mg Tabs (Amlodipine besylate) .... Take 1 tablet by mouth once a day 6)  Bayer Aspirin 325 Mg Tabs (Aspirin) .... Take 1 tablet by mouth once a day 7)  Ferrous Gluconate 240 (27 Fe) Mg Tabs (Ferrous gluconate) .... Take 1 tablet by mouth once a day 8)  Benzonatate 100 Mg Caps (Benzonatate) .... Take 1 every 8 hrs as needed for cough 9)  Pravastatin Sodium 80 Mg Tabs (Pravastatin sodium)  .... Take one tablet by mouth daily at bedtime 10)  Vitamin D 400 Unit Caps (Cholecalciferol) .... Take 1 tab daily  Other Orders: Medicare Electronic Prescription [phone]) Influenza Vaccine NON MCR (02725)  Patient Instructions: 1)  Please schedule a follow-up appointment in 4 months. 2)  you will be referred for pT to asses you for [facility]. 3)  no need to see ENT. 4)  flu vac today 5)  Lipid Panel prior to visit, ICD-9: 6)  BMP prior to visit, ICD-9:   fasting in 4 months 7)  Hepatic Panel prior to visit, ICD-9: Prescriptions: OXYBUTYNIN CHLORIDE 5 MG TABS (OXYBUTYNIN CHLORIDE) take 1/2 tab two times a day  #30 Each x 3   Entered by:   Adella Hare LPN   Authorized by:   Syliva Overman MD   Signed by:   Adella Hare LPN on 36/64/4034   Method used:   Electronically to        [facility] [address] [phone]* (retail)  [address] [place], [place]  [address]       Ph: [phone]       Fax: [phone]   RxID:   [id number]    Orders Added: 1)  Est. Patient Level IV [31517] 2)  Medicare Electronic Prescription [G8553] 3)  T-Basic Metabolic Panel [phone] 4)  T-Lipid Profile [80061-22930] 5)  T-Hepatic Function [80076-22960] 6)  Influenza Vaccine NON MCR [00028]   Immunizations Administered:  Influenza Vaccine # 1:    Vaccine Type: Fluvax Non-MCR    Site: left deltoid    Mfr: novartis    Dose: 0.5 ml    Route: IM    Given by: Adella Hare LPN    Exp. Date: 08/2010    Lot #: [id number]    VIS given: 11/11/09 version given March 09, 2010.   Immunizations Administered:  Influenza Vaccine # 1:    Vaccine Type: Fluvax Non-MCR    Site: left deltoid    Mfr: novartis    Dose: 0.5 ml    Route: IM    Given by: Adella Hare LPN    Exp. Date: 08/2010    Lot #: [id number]    VIS given: 11/11/09 version given March 09, 2010.

## 2010-05-19 NOTE — Progress Notes (Signed)
Summary: [facility]   Imported By: Lind Guest 05/05/2009 09:39:12  _____________________________________________________________________  External Attachment:    Type:   Image     Comment:   External Document

## 2010-05-19 NOTE — Progress Notes (Signed)
Summary: TONGUE BLACK  Phone Note Call from Patient   Summary of Call: HER TONGUE IS BLACK ON THE PAR.SIDE AND FUTHER BACK TOWARDS THROAT  Carolynne Edouard APPOINMENT  CALL BACK AT [phone] Initial call taken by: Lind Guest,  March 04, 2010 3:58 PM  Follow-up for Phone Call        i recommend she get eNT to see her about this, pls make appt with Dr Danice Goltz for eval and let her know, i will also put in box Follow-up by: Syliva Overman MD,  March 04, 2010 5:27 PM  Additional Follow-up for Phone Call Additional follow up Details #1::        appt. 12.8.11 @ dr. Suszanne Conners Additional Follow-up by: Lind Guest,  March 05, 2010 2:03 PM  New Problems: TONGUE DISORDER (ICD-529.9)   New Problems: TONGUE DISORDER (ICD-529.9)

## 2010-05-21 NOTE — Letter (Signed)
Summary: [facility]  [facility]   Imported By: Lind Guest 05/14/2010 11:40:27  _____________________________________________________________________  External Attachment:    Type:   Image     Comment:   External Document

## 2010-05-27 ENCOUNTER — Telehealth: Payer: Self-pay | Admitting: Family Medicine

## 2010-06-04 NOTE — Progress Notes (Signed)
Summary: sick  Phone Note Call from Patient   Summary of Call: pt is sick with virus and would like to be seen. No appts. Son called [phone] Initial call taken by: Rudene Anda,  May 27, 2010 10:05 AM  Follow-up for Phone Call        coughing and runny nose (clear) and vomitting and diarrhea since saturday but patient is improving no fever  advised there doesnt seem to be any signs of infection,  treat cough with otc cough med and there is no med for viral infection, but may be able to send somthing in for nausea and vomitting and diarrhea Follow-up by: Adella Hare LPN,  May 27, 2010 10:32 AM  Additional Follow-up for Phone Call Additional follow up Details #1::        I totally agree and wil lenterhistorically meds pls send after you spk with them (the phenergan got sent already) Additional Follow-up by: Syliva Overman MD,  May 27, 2010 11:43 AM    Additional Follow-up for Phone Call Additional follow up Details #2::    son aware Follow-up by: Adella Hare LPN,  May 27, 2010 1:40 PM  New/Updated Medications: PROMETHAZINE HCL 12.5 MG TABS (PROMETHAZINE HCL) Take 1 tablet by mouth three times a day as needed for nausea and vomitting LOMOTIL 2.5-0.025 MG TABS (DIPHENOXYLATE-ATROPINE) Take 1 tablet by mouth four times a day as needed for diarreah as needed for diarreah Prescriptions: LOMOTIL 2.5-0.025 MG TABS (DIPHENOXYLATE-ATROPINE) Take 1 tablet by mouth four times a day as needed for diarreah as needed for diarreah  #20 x 0   Entered by:   Adella Hare LPN   Authorized by:   Syliva Overman MD   Signed by:   Adella Hare LPN on 75/64/3329   Method used:   Printed then faxed to ...       [facility] [address] [phone]* (retail)       [address] [place], [place]  [address]       Ph: [phone]       Fax: [phone]   RxID:   [id number] PROMETHAZINE HCL 12.5 MG TABS (PROMETHAZINE HCL) Take 1 tablet by mouth three times a day as needed for  nausea and vomitting  #20 x 0   Entered by:   Adella Hare LPN   Authorized by:   Syliva Overman MD   Signed by:   Adella Hare LPN on 61/60/7371   Method used:   Printed then faxed to ...       [facility] [address] [phone]* (retail)       [address] [place], [place]  [address]       Ph: [phone]       Fax: [phone]   RxID:   [id number] LOMOTIL 2.5-0.025 MG TABS (DIPHENOXYLATE-ATROPINE) Take 1 tablet by mouth four times a day as needed for diarreah as needed for diarreah  #20 x 0   Entered and Authorized by:   Syliva Overman MD   Signed by:   Syliva Overman MD on 05/27/2010   Method used:   Historical   RxID:   [id number] PROMETHAZINE HCL 12.5 MG TABS (PROMETHAZINE HCL) Take 1 tablet by mouth three times a day as needed for nausea and vomitting  #20 x 0   Entered and Authorized by:   Syliva Overman MD   Signed by:   Syliva Overman MD on  05/27/2010   Method used:   Electronically to        [facility] [phone]* (retail)       [address], [place]  [address]       Ph: [phone]       Fax: [phone]   RxID:   [id number]

## 2010-06-05 ENCOUNTER — Encounter: Payer: Self-pay | Admitting: Family Medicine

## 2010-06-05 ENCOUNTER — Ambulatory Visit (INDEPENDENT_AMBULATORY_CARE_PROVIDER_SITE_OTHER): Payer: Medicare Other | Admitting: Family Medicine

## 2010-06-05 DIAGNOSIS — M25579 Pain in unspecified ankle and joints of unspecified foot: Secondary | ICD-10-CM

## 2010-06-05 DIAGNOSIS — E785 Hyperlipidemia, unspecified: Secondary | ICD-10-CM

## 2010-06-05 DIAGNOSIS — I10 Essential (primary) hypertension: Secondary | ICD-10-CM

## 2010-06-12 ENCOUNTER — Encounter: Payer: Self-pay | Admitting: Family Medicine

## 2010-06-12 LAB — CONVERTED CEMR LAB
ALT: 18 units/L (ref 0–35)
AST: 23 units/L (ref 0–37)
Albumin: 3.6 g/dL (ref 3.5–5.2)
Alkaline Phosphatase: 74 units/L (ref 39–117)
BUN: 5 mg/dL — ABNORMAL LOW (ref 6–23)
Bilirubin, Direct: 0.1 mg/dL (ref 0.0–0.3)
CO2: 25 meq/L (ref 19–32)
Calcium: 9.4 mg/dL (ref 8.4–10.5)
Chloride: 106 meq/L (ref 96–112)
Cholesterol: 176 mg/dL (ref 0–200)
Creatinine, Ser: 0.58 mg/dL (ref 0.40–1.20)
Glucose, Bld: 115 mg/dL — ABNORMAL HIGH (ref 70–99)
HDL: 29 mg/dL — ABNORMAL LOW (ref >39)
Indirect Bilirubin: 0.2 mg/dL (ref 0.0–0.9)
LDL Cholesterol: 118 mg/dL — ABNORMAL HIGH (ref 0–99)
Potassium: 3.6 meq/L (ref 3.5–5.3)
Sodium: 143 meq/L (ref 135–145)
Total Bilirubin: 0.3 mg/dL (ref 0.3–1.2)
Total CHOL/HDL Ratio: 6.1
Total Protein: 7.6 g/dL (ref 6.0–8.3)
Triglycerides: 145 mg/dL (ref <150)
Uric Acid, Serum: 4.1 mg/dL (ref 2.4–7.0)
VLDL: 29 mg/dL (ref 0–40)
Vit D, 25-Hydroxy: 59 ng/mL (ref 30–89)

## 2010-06-16 NOTE — Letter (Signed)
Summary: wheelchair  wheelchair   Imported By: Lind Guest 06/12/2010 14:42:12  _____________________________________________________________________  External Attachment:    Type:   Image     Comment:   External Document

## 2010-06-16 NOTE — Letter (Signed)
Summary: powered mobility device order  powered mobility device order   Imported By: Lind Guest 06/12/2010 11:29:41  _____________________________________________________________________  External Attachment:    Type:   Image     Comment:   External Document

## 2010-06-16 NOTE — Assessment & Plan Note (Signed)
Summary: office visit   Vital Signs:  Patient profile:   55 year old female Height:      67 inches Weight:      147.75 pounds O2 Sat:      96 % Pulse rate:   83 / minute Resp:     16 per minute BP sitting:   122 / 72  (left arm) Cuff size:   regular  Vitals Entered By: Everitt Amber LPN (June 05, 2010 9:41 AM) CC: c/o right ankle and knee has been swollen x 3-4 days. No appetite, not eating good Comments didn't bring meds to OV. States list is correct    Primary Care Provider:  Syliva Overman MD  CC:  c/o right ankle and knee has been swollen x 3-4 days. No appetite and not eating good.  History of Present Illness: Danielle Cruz is sseen today with a primary complaint of a 1 week h/o ankle pain, not associated with any trauma, her spouse also reports swelling. She aqlso wants to readress the needs for a mobility face to face exam, for which she qualifies. her medical history is significant  for a CVA with right hemiparesis in 2009, she also has a pacemaker, and has arthritis. She has no actibe movement in the RUE, and has significant expressive aphasia. She is unable to safely ambulate in her home, and is able to minimally participate in her ADL's. She tires easily with activity  Allergies (verified): No Known Drug Allergies  Review of Systems      See HPI General:  Complains of fatigue. Eyes:  Denies discharge, eye pain, and red eye. ENT:  Denies hoarseness and nasal congestion. CV:  Denies chest pain or discomfort, palpitations, and swelling of feet. Resp:  Denies cough and sputum productive. GI:  Complains of diarrhea, nausea, and vomiting; 1 week ago, resolved. GU:  Denies dysuria. MS:  Complains of joint pain, joint swelling, loss of strength, muscle weakness, and stiffness; right anklle pain x 1 week. Derm:  Denies itching and rash. Neuro:  Complains of disturbances in coordination, falling down, numbness, poor balance, and weakness. Psych:  Denies anxiety and  depression. Endo:  Denies cold intolerance, excessive hunger, excessive thirst, and excessive urination. Heme:  Denies abnormal bruising, bleeding, and enlarge lymph nodes. Allergy:  Denies hives or rash, itching eyes, and persistent infections.  Physical Exam  General:  Pleasant,adequately -nourished female in no acute distress; alert, and cooperative throughout examination. Pt has expressive aphasia .Pt is wheelchair bound. HEENT: No facial asymmetry,  EOMI, No sinus tenderness, TM's Clear, oropharynx  pink and moist.   Chest: Clear to auscultation bilaterally.  CVS: S1, S2, No murmurs, No S3.   Abd: Soft, Nontender.  FA:[id number]  ROM spine, hips, shoulders and knees. Right ankle swollen and tender with reduced ROM. Ext: No edema.   CNS: CN 2-12 intact, reduced power and  tone in  right upper and lower extremeties.  Skin: Intact, no visible lesions or rashes.  Psych: Good eye contact, normal affect.  Memory unable to asss,not anxious or depressed appearing.    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 Take 1  tablet by mouth once a day pt completed therapy and has improved strength, though still qualifies fopr a scooter  Problem # 3:  HYPERTENSION (ICD-401.9) Assessment: Unchanged  Her updated medication list for this problem includes:    Metoprolol Succinate 25 Mg Xr24h-tab (Metoprolol succinate) .Marland Kitchen... Take 1 tablet by mouth once a day    Amlodipine Besylate 10 Mg Tabs (Amlodipine besylate) .Marland Kitchen... Take 1 tablet by mouth once a day  Orders: T-Basic Metabolic Panel  ([id number])  BP today: 122/72 Prior BP: 128/70 (03/09/2010)  Labs Reviewed: K+: 4.3 (01/19/2010) Creat: : 0.55 (01/19/2010)   Chol: 165 (01/19/2010)   HDL: 31 (01/19/2010)   LDL: 101 (01/19/2010)   TG: 166 (01/19/2010)  Problem # 4:  HYPERLIPIDEMIA (ICD-272.4) Assessment: Comment Only  Her updated medication list for this problem includes:    Pravastatin Sodium 80 Mg Tabs (Pravastatin sodium) .Marland Kitchen... Take one tablet by mouth daily at bedtime  Orders: T-Lipid Profile [phone]) T-Hepatic Function [phone])  Labs Reviewed: SGOT: 26 (01/19/2010)   SGPT: 25 (01/19/2010)   HDL:31 (01/19/2010), 41 (11/13/2009)  LDL:101 (01/19/2010), 112 (11/13/2009)  Chol:165 (01/19/2010), 177 (11/13/2009)  Trig:166 (01/19/2010), 121 (11/13/2009) Low fat dietdiscussed and encouraged  Complete Medication List: 1)  Metoprolol Succinate 25 Mg Xr24h-tab (Metoprolol succinate) .... Take 1 tablet by mouth once a day 2)  Leflunomide 20 Mg Tabs (Leflunomide) .... Take 1 tablet by mouth once a day 3)  Oxybutynin Chloride 5 Mg Tabs (Oxybutynin chloride) .... Take 1/2 tab two times a day 4)  Amlodipine Besylate 10 Mg Tabs (Amlodipine besylate) .... Take 1 tablet by mouth once a day 5)  Bayer Aspirin 325 Mg Tabs (Aspirin) .... Take 1 tablet by mouth once a day 6)  Ferrous Gluconate 240 (27 Fe) Mg Tabs (Ferrous gluconate) .... Take 1 tablet by mouth once a day 7)  Pravastatin Sodium 80 Mg Tabs (Pravastatin sodium) .... Take one tablet by mouth daily at bedtime 8)  Vitamin D 400 Unit Caps (Cholecalciferol) .... Take 1 tab daily  Other Orders: T-Vitamin D (25-Hydroxy) ([id number])  Patient Instructions: 1)  Please schedule a follow-up appointment in 3 months. 2)  BMP prior to visit, ICD-9: 3)  Hepatic Panel prior to visit, ICD-9: 4)  Lipid Panel prior to visit, ICD-9: 5)  Vitamin D 6)  Uric acid 7)  You will get injections today for the ankle pain. 8)  You wll get a call about your scooter next week,  you qualify   Medication Administration  Injection # 1:    Medication: Depo- Medrol 40mg     Diagnosis: ANKLE PAIN, RIGHT (ICD-719.47)    Route: IM    Site: RUOQ gluteus    Exp Date: 01/13    Lot #: [id number]    Mfr: Pharmacia    Patient tolerated injection without complications    Given by: Adella Hare LPN (June 05, 2010 10:17 AM)  Injection # 2:    Medication: Ketorolac-Toradol 15mg     Diagnosis: ANKLE PAIN, RIGHT (ICD-719.47)    Route: IM    Site: LUOQ gluteus    Exp Date: 08/18/2010    Lot #: [id number]    Mfr: novaplus    Comments: toradol 30mg  given    Patient tolerated injection without complications    Given by: Adella Hare LPN (June 05, 2010 10:18 AM)  Orders Added: 1)  Est. Patient Level IV [25366] 2)  T-Basic Metabolic Panel [[id number]] 3)  T-Lipid Profile [80061-22930] 4)  T-Hepatic Function [56387-56433] 5)  T-Uric Acid (Blood) [84550-23180] 6)  T-Vitamin D (25-Hydroxy) [phone] 7)  Depo- Medrol 40mg  [J1030] 8)  Ketorolac-Toradol 15mg  [J1885] 9)  Admin of Therapeutic Inj  intramuscular or subcutaneous [96372]     Medication Administration  Injection # 1:    Medication: Depo- Medrol 40mg     Diagnosis: ANKLE PAIN, RIGHT (ICD-719.47)    Route: IM    Site: RUOQ gluteus    Exp Date: 01/13    Lot #: [id number] [id number]    Mfr: Pharmacia    Patient tolerated injection without complications    Given by: Adella Hare LPN (June 05, 2010 10:17 AM)  Injection # 2:    Medication: Ketorolac-Toradol 15mg     Diagnosis: ANKLE PAIN, RIGHT (ICD-719.47)    Route: IM    Site: LUOQ gluteus    Exp Date: 08/18/2010    Lot #: [id number]    Mfr: [email or web address]    Comments: toradol 30mg  given    Patient tolerated injection without complications    Given by: Adella Hare LPN (June 05, 2010 10:18 AM)  Orders Added: 1)  Est. Patient Level IV [31540] 2)  T-Basic Metabolic Panel [[id number]] 3)  T-Lipid Profile [80061-22930] 4)  T-Hepatic Function [32671-24580] 5)   T-Uric Acid (Blood) [84550-23180] 6)  T-Vitamin D (25-Hydroxy) [99833-82505] 7)  Depo- Medrol 40mg  [J1030] 8)  Ketorolac-Toradol 15mg  [J1885] 9)  Admin of Therapeutic Inj  intramuscular or subcutaneous [39767]

## 2010-06-19 LAB — CONVERTED CEMR LAB: Hgb A1c MFr Bld: 5.6 % (ref <5.7)

## 2010-06-23 ENCOUNTER — Encounter: Payer: Self-pay | Admitting: Family Medicine

## 2010-06-30 ENCOUNTER — Encounter: Payer: Self-pay | Admitting: Family Medicine

## 2010-07-05 LAB — DIFFERENTIAL
Basophils Absolute: 0 10*3/uL (ref 0.0–0.1)
Basophils Relative: 0 % (ref 0–1)
Eosinophils Absolute: 0.2 10*3/uL (ref 0.0–0.7)
Eosinophils Relative: 3 % (ref 0–5)
Lymphocytes Relative: 31 % (ref 12–46)
Lymphs Abs: 2.2 10*3/uL (ref 0.7–4.0)
Monocytes Absolute: 0.7 10*3/uL (ref 0.1–1.0)
Monocytes Relative: 10 % (ref 3–12)
Neutro Abs: 4 10*3/uL (ref 1.7–7.7)
Neutrophils Relative %: 56 % (ref 43–77)

## 2010-07-05 LAB — URINALYSIS, ROUTINE W REFLEX MICROSCOPIC
Bilirubin Urine: NEGATIVE
Glucose, UA: NEGATIVE mg/dL
Hgb urine dipstick: NEGATIVE
Ketones, ur: NEGATIVE mg/dL
Nitrite: NEGATIVE
Protein, ur: NEGATIVE mg/dL
Specific Gravity, Urine: 1.02 (ref 1.005–1.030)
Urobilinogen, UA: 0.2 mg/dL (ref 0.0–1.0)
pH: 6.5 (ref 5.0–8.0)

## 2010-07-05 LAB — BASIC METABOLIC PANEL
BUN: 5 mg/dL — ABNORMAL LOW (ref 6–23)
CO2: 24 mEq/L (ref 19–32)
Calcium: 9.2 mg/dL (ref 8.4–10.5)
Chloride: 103 mEq/L (ref 96–112)
Creatinine, Ser: 0.56 mg/dL (ref 0.4–1.2)
GFR calc Af Amer: >60 mL/min (ref >60)
GFR calc non Af Amer: >60 mL/min (ref >60)
Glucose, Bld: 113 mg/dL — ABNORMAL HIGH (ref 70–99)
Potassium: 3.5 mEq/L (ref 3.5–5.1)
Sodium: 138 mEq/L (ref 135–145)

## 2010-07-05 LAB — CBC
HCT: 37.3 % (ref 36.0–46.0)
Hemoglobin: 12 g/dL (ref 12.0–15.0)
MCHC: 32.1 g/dL (ref 30.0–36.0)
MCV: 91.4 fL (ref 78.0–100.0)
Platelets: 194 10*3/uL (ref 150–400)
RBC: 4.08 MIL/uL (ref 3.87–5.11)
RDW: 15.4 % (ref 11.5–15.5)
WBC: 7.2 10*3/uL (ref 4.0–10.5)

## 2010-07-07 NOTE — Letter (Signed)
Summary: power mobility  power mobility   Imported By: Lind Guest 06/30/2010 08:57:19  _____________________________________________________________________  External Attachment:    Type:   Image     Comment:   External Document

## 2010-07-09 ENCOUNTER — Ambulatory Visit: Payer: Self-pay | Admitting: Family Medicine

## 2010-07-21 LAB — CREATININE, SERUM
Creatinine, Ser: 0.54 mg/dL (ref 0.4–1.2)
GFR calc Af Amer: >60 mL/min (ref >60)
GFR calc non Af Amer: >60 mL/min (ref >60)

## 2010-07-21 LAB — BUN: BUN: 6 mg/dL (ref 6–23)

## 2010-07-24 ENCOUNTER — Other Ambulatory Visit: Payer: Self-pay | Admitting: Cardiology

## 2010-07-25 LAB — DIFFERENTIAL
Basophils Absolute: 0 10*3/uL (ref 0.0–0.1)
Basophils Relative: 1 % (ref 0–1)
Eosinophils Absolute: 0.1 10*3/uL (ref 0.0–0.7)
Eosinophils Relative: 2 % (ref 0–5)
Lymphocytes Relative: 27 % (ref 12–46)
Lymphs Abs: 1.5 10*3/uL (ref 0.7–4.0)
Monocytes Absolute: 0.7 10*3/uL (ref 0.1–1.0)
Monocytes Relative: 13 % — ABNORMAL HIGH (ref 3–12)
Neutro Abs: 3 10*3/uL (ref 1.7–7.7)
Neutrophils Relative %: 57 % (ref 43–77)

## 2010-07-25 LAB — CBC
HCT: 29.3 % — ABNORMAL LOW (ref 36.0–46.0)
Hemoglobin: 9.9 g/dL — ABNORMAL LOW (ref 12.0–15.0)
MCHC: 33.9 g/dL (ref 30.0–36.0)
MCV: 90.9 fL (ref 78.0–100.0)
Platelets: 237 10*3/uL (ref 150–400)
RBC: 3.23 MIL/uL — ABNORMAL LOW (ref 3.87–5.11)
RDW: 14.9 % (ref 11.5–15.5)
WBC: 5.3 10*3/uL (ref 4.0–10.5)

## 2010-07-25 LAB — HEMOGLOBIN AND HEMATOCRIT, BLOOD
HCT: 31 % — ABNORMAL LOW (ref 36.0–46.0)
Hemoglobin: 10.4 g/dL — ABNORMAL LOW (ref 12.0–15.0)

## 2010-07-26 LAB — BASIC METABOLIC PANEL
BUN: 4 mg/dL — ABNORMAL LOW (ref 6–23)
CO2: 30 mEq/L (ref 19–32)
Calcium: 9.7 mg/dL (ref 8.4–10.5)
Chloride: 101 mEq/L (ref 96–112)
Creatinine, Ser: 0.47 mg/dL (ref 0.4–1.2)
GFR calc Af Amer: >60 mL/min (ref >60)
GFR calc non Af Amer: >60 mL/min (ref >60)
Glucose, Bld: 94 mg/dL (ref 70–99)
Potassium: 3.1 mEq/L — ABNORMAL LOW (ref 3.5–5.1)
Sodium: 136 mEq/L (ref 135–145)

## 2010-07-26 LAB — CBC
HCT: 33 % — ABNORMAL LOW (ref 36.0–46.0)
Hemoglobin: 11.2 g/dL — ABNORMAL LOW (ref 12.0–15.0)
MCHC: 33.8 g/dL (ref 30.0–36.0)
MCV: 93 fL (ref 78.0–100.0)
Platelets: 249 10*3/uL (ref 150–400)
RBC: 3.55 MIL/uL — ABNORMAL LOW (ref 3.87–5.11)
RDW: 14.4 % (ref 11.5–15.5)
WBC: 7.7 10*3/uL (ref 4.0–10.5)

## 2010-07-26 LAB — DIFFERENTIAL
Basophils Absolute: 0 10*3/uL (ref 0.0–0.1)
Basophils Relative: 1 % (ref 0–1)
Eosinophils Absolute: 0.1 10*3/uL (ref 0.0–0.7)
Eosinophils Relative: 2 % (ref 0–5)
Lymphocytes Relative: 21 % (ref 12–46)
Lymphs Abs: 1.6 10*3/uL (ref 0.7–4.0)
Monocytes Absolute: 0.7 10*3/uL (ref 0.1–1.0)
Monocytes Relative: 9 % (ref 3–12)
Neutro Abs: 5.2 10*3/uL (ref 1.7–7.7)
Neutrophils Relative %: 68 % (ref 43–77)

## 2010-07-26 LAB — PROTIME-INR
INR: 1.1 (ref 0.00–1.49)
Prothrombin Time: 14.5 seconds (ref 11.6–15.2)

## 2010-07-27 LAB — BASIC METABOLIC PANEL
BUN: 5 mg/dL — ABNORMAL LOW (ref 6–23)
CO2: 26 mEq/L (ref 19–32)
Calcium: 9.6 mg/dL (ref 8.4–10.5)
Chloride: 104 mEq/L (ref 96–112)
Creatinine, Ser: 0.51 mg/dL (ref 0.4–1.2)
GFR calc Af Amer: >60 mL/min (ref >60)
GFR calc non Af Amer: >60 mL/min (ref >60)
Glucose, Bld: 99 mg/dL (ref 70–99)
Potassium: 3.7 mEq/L (ref 3.5–5.1)
Sodium: 140 mEq/L (ref 135–145)

## 2010-07-27 LAB — GLUCOSE, CAPILLARY: Glucose-Capillary: 100 mg/dL — ABNORMAL HIGH (ref 70–99)

## 2010-07-27 LAB — CBC
HCT: 36.2 % (ref 36.0–46.0)
Hemoglobin: 12 g/dL (ref 12.0–15.0)
MCHC: 33.2 g/dL (ref 30.0–36.0)
MCV: 95.8 fL (ref 78.0–100.0)
Platelets: 190 10*3/uL (ref 150–400)
RBC: 3.78 MIL/uL — ABNORMAL LOW (ref 3.87–5.11)
RDW: 14.3 % (ref 11.5–15.5)
WBC: 4 10*3/uL (ref 4.0–10.5)

## 2010-07-28 ENCOUNTER — Other Ambulatory Visit: Payer: Self-pay

## 2010-07-28 DIAGNOSIS — I10 Essential (primary) hypertension: Secondary | ICD-10-CM

## 2010-07-28 LAB — COMPREHENSIVE METABOLIC PANEL
ALT: 52 U/L — ABNORMAL HIGH (ref 0–35)
ALT: 54 U/L — ABNORMAL HIGH (ref 0–35)
ALT: 56 U/L — ABNORMAL HIGH (ref 0–35)
ALT: 55 U/L — ABNORMAL HIGH (ref 0–35)
AST: 46 U/L — ABNORMAL HIGH (ref 0–37)
AST: 52 U/L — ABNORMAL HIGH (ref 0–37)
AST: 57 U/L — ABNORMAL HIGH (ref 0–37)
AST: 74 U/L — ABNORMAL HIGH (ref 0–37)
Albumin: 2.8 g/dL — ABNORMAL LOW (ref 3.5–5.2)
Albumin: 3 g/dL — ABNORMAL LOW (ref 3.5–5.2)
Albumin: 3.1 g/dL — ABNORMAL LOW (ref 3.5–5.2)
Albumin: 3.1 g/dL — ABNORMAL LOW (ref 3.5–5.2)
Alkaline Phosphatase: 74 U/L (ref 39–117)
Alkaline Phosphatase: 74 U/L (ref 39–117)
Alkaline Phosphatase: 84 U/L (ref 39–117)
Alkaline Phosphatase: 84 U/L (ref 39–117)
BUN: 6 mg/dL (ref 6–23)
BUN: 7 mg/dL (ref 6–23)
BUN: 8 mg/dL (ref 6–23)
BUN: 5 mg/dL — ABNORMAL LOW (ref 6–23)
CO2: 20 mEq/L (ref 19–32)
CO2: 25 mEq/L (ref 19–32)
CO2: 27 mEq/L (ref 19–32)
CO2: 25 mEq/L (ref 19–32)
Calcium: 9 mg/dL (ref 8.4–10.5)
Calcium: 9.2 mg/dL (ref 8.4–10.5)
Calcium: 9.4 mg/dL (ref 8.4–10.5)
Calcium: 8.6 mg/dL (ref 8.4–10.5)
Chloride: 104 mEq/L (ref 96–112)
Chloride: 104 mEq/L (ref 96–112)
Chloride: 106 mEq/L (ref 96–112)
Chloride: 103 mEq/L (ref 96–112)
Creatinine, Ser: 0.52 mg/dL (ref 0.4–1.2)
Creatinine, Ser: 0.56 mg/dL (ref 0.4–1.2)
Creatinine, Ser: 0.56 mg/dL (ref 0.4–1.2)
Creatinine, Ser: 0.47 mg/dL (ref 0.4–1.2)
GFR calc Af Amer: >60 mL/min (ref >60)
GFR calc Af Amer: >60 mL/min (ref >60)
GFR calc Af Amer: >60 mL/min (ref >60)
GFR calc Af Amer: >60 mL/min (ref >60)
GFR calc non Af Amer: >60 mL/min (ref >60)
GFR calc non Af Amer: >60 mL/min (ref >60)
GFR calc non Af Amer: >60 mL/min (ref >60)
GFR calc non Af Amer: >60 mL/min (ref >60)
Glucose, Bld: 116 mg/dL — ABNORMAL HIGH (ref 70–99)
Glucose, Bld: 84 mg/dL (ref 70–99)
Glucose, Bld: 93 mg/dL (ref 70–99)
Glucose, Bld: 131 mg/dL — ABNORMAL HIGH (ref 70–99)
Potassium: 3.7 mEq/L (ref 3.5–5.1)
Potassium: 4 mEq/L (ref 3.5–5.1)
Potassium: 4 mEq/L (ref 3.5–5.1)
Potassium: 2.4 mEq/L — CL (ref 3.5–5.1)
Sodium: 135 mEq/L (ref 135–145)
Sodium: 136 mEq/L (ref 135–145)
Sodium: 137 mEq/L (ref 135–145)
Sodium: 139 mEq/L (ref 135–145)
Total Bilirubin: 0.3 mg/dL (ref 0.3–1.2)
Total Bilirubin: 0.5 mg/dL (ref 0.3–1.2)
Total Bilirubin: 0.6 mg/dL (ref 0.3–1.2)
Total Bilirubin: 0.3 mg/dL (ref 0.3–1.2)
Total Protein: 7.1 g/dL (ref 6.0–8.3)
Total Protein: 7.2 g/dL (ref 6.0–8.3)
Total Protein: 7.2 g/dL (ref 6.0–8.3)
Total Protein: 7.5 g/dL (ref 6.0–8.3)

## 2010-07-28 LAB — BASIC METABOLIC PANEL
BUN: 4 mg/dL — ABNORMAL LOW (ref 6–23)
BUN: 4 mg/dL — ABNORMAL LOW (ref 6–23)
BUN: 4 mg/dL — ABNORMAL LOW (ref 6–23)
BUN: 5 mg/dL — ABNORMAL LOW (ref 6–23)
BUN: 4 mg/dL — ABNORMAL LOW (ref 6–23)
BUN: 5 mg/dL — ABNORMAL LOW (ref 6–23)
CO2: 22 mEq/L (ref 19–32)
CO2: 22 mEq/L (ref 19–32)
CO2: 22 mEq/L (ref 19–32)
CO2: 28 mEq/L (ref 19–32)
CO2: 25 mEq/L (ref 19–32)
CO2: 27 mEq/L (ref 19–32)
Calcium: 8.4 mg/dL (ref 8.4–10.5)
Calcium: 8.8 mg/dL (ref 8.4–10.5)
Calcium: 8.9 mg/dL (ref 8.4–10.5)
Calcium: 9.4 mg/dL (ref 8.4–10.5)
Calcium: 8.1 mg/dL — ABNORMAL LOW (ref 8.4–10.5)
Calcium: 8.2 mg/dL — ABNORMAL LOW (ref 8.4–10.5)
Chloride: 102 mEq/L (ref 96–112)
Chloride: 103 mEq/L (ref 96–112)
Chloride: 106 mEq/L (ref 96–112)
Chloride: 109 mEq/L (ref 96–112)
Chloride: 103 mEq/L (ref 96–112)
Chloride: 106 mEq/L (ref 96–112)
Creatinine, Ser: 0.48 mg/dL (ref 0.4–1.2)
Creatinine, Ser: 0.51 mg/dL (ref 0.4–1.2)
Creatinine, Ser: 0.52 mg/dL (ref 0.4–1.2)
Creatinine, Ser: 0.54 mg/dL (ref 0.4–1.2)
Creatinine, Ser: 0.5 mg/dL (ref 0.4–1.2)
Creatinine, Ser: 0.54 mg/dL (ref 0.4–1.2)
GFR calc Af Amer: >60 mL/min (ref >60)
GFR calc Af Amer: >60 mL/min (ref >60)
GFR calc Af Amer: >60 mL/min (ref >60)
GFR calc Af Amer: >60 mL/min (ref >60)
GFR calc Af Amer: >60 mL/min (ref >60)
GFR calc Af Amer: >60 mL/min (ref >60)
GFR calc non Af Amer: >60 mL/min (ref >60)
GFR calc non Af Amer: >60 mL/min (ref >60)
GFR calc non Af Amer: >60 mL/min (ref >60)
GFR calc non Af Amer: >60 mL/min (ref >60)
GFR calc non Af Amer: >60 mL/min (ref >60)
GFR calc non Af Amer: >60 mL/min (ref >60)
Glucose, Bld: 101 mg/dL — ABNORMAL HIGH (ref 70–99)
Glucose, Bld: 92 mg/dL (ref 70–99)
Glucose, Bld: 97 mg/dL (ref 70–99)
Glucose, Bld: 99 mg/dL (ref 70–99)
Glucose, Bld: 95 mg/dL (ref 70–99)
Glucose, Bld: 97 mg/dL (ref 70–99)
Potassium: 3.8 mEq/L (ref 3.5–5.1)
Potassium: 4.1 mEq/L (ref 3.5–5.1)
Potassium: 4.4 mEq/L (ref 3.5–5.1)
Potassium: 4.4 mEq/L (ref 3.5–5.1)
Potassium: 2.5 mEq/L — CL (ref 3.5–5.1)
Potassium: 2.9 mEq/L — ABNORMAL LOW (ref 3.5–5.1)
Sodium: 135 mEq/L (ref 135–145)
Sodium: 136 mEq/L (ref 135–145)
Sodium: 137 mEq/L (ref 135–145)
Sodium: 138 mEq/L (ref 135–145)
Sodium: 141 mEq/L (ref 135–145)
Sodium: 142 mEq/L (ref 135–145)

## 2010-07-28 LAB — CBC
HCT: 36.3 % (ref 36.0–46.0)
HCT: 36.3 % (ref 36.0–46.0)
HCT: 37.3 % (ref 36.0–46.0)
HCT: 35.9 % — ABNORMAL LOW (ref 36.0–46.0)
HCT: 36.1 % (ref 36.0–46.0)
Hemoglobin: 12.3 g/dL (ref 12.0–15.0)
Hemoglobin: 12.4 g/dL (ref 12.0–15.0)
Hemoglobin: 12.7 g/dL (ref 12.0–15.0)
Hemoglobin: 12.1 g/dL (ref 12.0–15.0)
Hemoglobin: 12.3 g/dL (ref 12.0–15.0)
MCHC: 33.9 g/dL (ref 30.0–36.0)
MCHC: 34 g/dL (ref 30.0–36.0)
MCHC: 34.2 g/dL (ref 30.0–36.0)
MCHC: 33.5 g/dL (ref 30.0–36.0)
MCHC: 34.3 g/dL (ref 30.0–36.0)
MCV: 98.7 fL (ref 78.0–100.0)
MCV: 98.8 fL (ref 78.0–100.0)
MCV: 98.9 fL (ref 78.0–100.0)
MCV: 99.4 fL (ref 78.0–100.0)
MCV: 99.5 fL (ref 78.0–100.0)
Platelets: 202 10*3/uL (ref 150–400)
Platelets: 204 10*3/uL (ref 150–400)
Platelets: 209 10*3/uL (ref 150–400)
Platelets: 207 10*3/uL (ref 150–400)
Platelets: 230 10*3/uL (ref 150–400)
RBC: 3.67 MIL/uL — ABNORMAL LOW (ref 3.87–5.11)
RBC: 3.68 MIL/uL — ABNORMAL LOW (ref 3.87–5.11)
RBC: 3.77 MIL/uL — ABNORMAL LOW (ref 3.87–5.11)
RBC: 3.62 MIL/uL — ABNORMAL LOW (ref 3.87–5.11)
RBC: 3.63 MIL/uL — ABNORMAL LOW (ref 3.87–5.11)
RDW: 15.3 % (ref 11.5–15.5)
RDW: 15.4 % (ref 11.5–15.5)
RDW: 15.6 % — ABNORMAL HIGH (ref 11.5–15.5)
RDW: 15.2 % (ref 11.5–15.5)
RDW: 15.4 % (ref 11.5–15.5)
WBC: 5.4 10*3/uL (ref 4.0–10.5)
WBC: 6.2 10*3/uL (ref 4.0–10.5)
WBC: 6.3 10*3/uL (ref 4.0–10.5)
WBC: 5.8 10*3/uL (ref 4.0–10.5)
WBC: 6.1 10*3/uL (ref 4.0–10.5)

## 2010-07-28 LAB — DIFFERENTIAL
Basophils Absolute: 0 10*3/uL (ref 0.0–0.1)
Basophils Absolute: 0 10*3/uL (ref 0.0–0.1)
Basophils Relative: 0 % (ref 0–1)
Basophils Relative: 0 % (ref 0–1)
Eosinophils Absolute: 0.1 10*3/uL (ref 0.0–0.7)
Eosinophils Absolute: 0 10*3/uL (ref 0.0–0.7)
Eosinophils Relative: 2 % (ref 0–5)
Eosinophils Relative: 0 % (ref 0–5)
Lymphocytes Relative: 22 % (ref 12–46)
Lymphocytes Relative: 10 % — ABNORMAL LOW (ref 12–46)
Lymphs Abs: 1.4 10*3/uL (ref 0.7–4.0)
Lymphs Abs: 0.6 10*3/uL — ABNORMAL LOW (ref 0.7–4.0)
Monocytes Absolute: 0.8 10*3/uL (ref 0.1–1.0)
Monocytes Absolute: 0.5 10*3/uL (ref 0.1–1.0)
Monocytes Relative: 12 % (ref 3–12)
Monocytes Relative: 8 % (ref 3–12)
Neutro Abs: 4.1 10*3/uL (ref 1.7–7.7)
Neutro Abs: 5 10*3/uL (ref 1.7–7.7)
Neutrophils Relative %: 64 % (ref 43–77)
Neutrophils Relative %: 82 % — ABNORMAL HIGH (ref 43–77)

## 2010-07-28 LAB — CARDIAC PANEL(CRET KIN+CKTOT+MB+TROPI)
CK, MB: 2.6 ng/mL (ref 0.3–4.0)
CK, MB: 3.4 ng/mL (ref 0.3–4.0)
Relative Index: 1 (ref 0.0–2.5)
Relative Index: 1.3 (ref 0.0–2.5)
Total CK: 251 U/L — ABNORMAL HIGH (ref 7–177)
Total CK: 272 U/L — ABNORMAL HIGH (ref 7–177)
Troponin I: 0.01 ng/mL (ref 0.00–0.06)
Troponin I: 0.01 ng/mL (ref 0.00–0.06)

## 2010-07-28 LAB — URINALYSIS, MICROSCOPIC ONLY
Glucose, UA: NEGATIVE mg/dL
Ketones, ur: NEGATIVE mg/dL
Nitrite: NEGATIVE
Protein, ur: 30 mg/dL — AB
Specific Gravity, Urine: 1.03 (ref 1.005–1.030)
Urobilinogen, UA: 0.2 mg/dL (ref 0.0–1.0)
pH: 6 (ref 5.0–8.0)

## 2010-07-28 LAB — GLUCOSE, RANDOM: Glucose, Bld: 101 mg/dL — ABNORMAL HIGH (ref 70–99)

## 2010-07-28 LAB — HEPATIC FUNCTION PANEL
ALT: 44 U/L — ABNORMAL HIGH (ref 0–35)
AST: 46 U/L — ABNORMAL HIGH (ref 0–37)
Albumin: 2.6 g/dL — ABNORMAL LOW (ref 3.5–5.2)
Alkaline Phosphatase: 77 U/L (ref 39–117)
Bilirubin, Direct: 0.1 mg/dL (ref 0.0–0.3)
Indirect Bilirubin: 0.4 mg/dL (ref 0.3–0.9)
Total Bilirubin: 0.5 mg/dL (ref 0.3–1.2)
Total Protein: 6.6 g/dL (ref 6.0–8.3)

## 2010-07-28 LAB — TSH: TSH: 0.781 u[IU]/mL (ref 0.350–4.500)

## 2010-07-28 LAB — FOLATE RBC: RBC Folate: 991 ng/mL — ABNORMAL HIGH (ref 180–600)

## 2010-07-28 LAB — RAPID URINE DRUG SCREEN, HOSP PERFORMED
Amphetamines: NOT DETECTED
Barbiturates: NOT DETECTED
Benzodiazepines: NOT DETECTED
Cocaine: NOT DETECTED
Opiates: NOT DETECTED
Tetrahydrocannabinol: POSITIVE — AB

## 2010-07-28 LAB — VITAMIN B1: Vitamin B1 (Thiamine): 25 nmol/L (ref 9–44)

## 2010-07-28 LAB — POCT CARDIAC MARKERS
CKMB, poc: 2.9 ng/mL (ref 1.0–8.0)
Myoglobin, poc: 143 ng/mL (ref 12–200)
Troponin i, poc: <0.05 ng/mL (ref 0.00–0.09)

## 2010-07-28 LAB — LIPID PANEL
Cholesterol: 176 mg/dL (ref 0–200)
HDL: 19 mg/dL — ABNORMAL LOW (ref >39)
LDL Cholesterol: 110 mg/dL — ABNORMAL HIGH (ref 0–99)
Total CHOL/HDL Ratio: 9.3 RATIO
Triglycerides: 235 mg/dL — ABNORMAL HIGH (ref <150)
VLDL: 47 mg/dL — ABNORMAL HIGH (ref 0–40)

## 2010-07-28 LAB — URINE CULTURE: Colony Count: >=100000

## 2010-07-28 LAB — PROTIME-INR
INR: 1.1 (ref 0.00–1.49)
Prothrombin Time: 14.9 seconds (ref 11.6–15.2)

## 2010-07-28 LAB — HEMOGLOBIN A1C
Hgb A1c MFr Bld: 5.2 % (ref 4.6–6.1)
Mean Plasma Glucose: 103 mg/dL

## 2010-07-28 LAB — VITAMIN B12: Vitamin B-12: 392 pg/mL (ref 211–911)

## 2010-07-28 LAB — ETHANOL: Alcohol, Ethyl (B): 16 mg/dL — ABNORMAL HIGH (ref 0–10)

## 2010-07-28 LAB — GLUCOSE, CAPILLARY: Glucose-Capillary: 135 mg/dL — ABNORMAL HIGH (ref 70–99)

## 2010-07-28 MED ORDER — METOPROLOL SUCCINATE ER 25 MG PO TB24: 25.0000 mg | ORAL_TABLET | Freq: Every day | ORAL | Status: DC

## 2010-07-29 ENCOUNTER — Other Ambulatory Visit: Payer: Self-pay | Admitting: Family Medicine

## 2010-07-29 DIAGNOSIS — R32 Unspecified urinary incontinence: Secondary | ICD-10-CM

## 2010-08-25 ENCOUNTER — Telehealth: Payer: Self-pay | Admitting: Family Medicine

## 2010-08-27 NOTE — Telephone Encounter (Signed)
Had a question as to if she could be managed at the facility, in terms of functional ability, advised she is able to transfer with assistance and feed herself with assistance to my knowledge, she will see the pt and decide, just wanted some input

## 2010-09-01 NOTE — Consult Note (Signed)
NAME:  Danielle Cruz, Danielle Cruz NO.:  [id number]   MEDICAL RECORD NO.:  [id number]          PATIENT TYPE:  INP   LOCATION:  3111                         FACILITY:  [facility]   PHYSICIAN:  Marlan Palau, M.D.  DATE OF BIRTH:  05/20/1955   DATE OF CONSULTATION:  08/30/2008  DATE OF DISCHARGE:                                 CONSULTATION   CHIEF COMPLAINT:  CVA, left MCA territory.   HISTORY OF PRESENT ILLNESS:  This is a African American female who was  55 years old, who apparently went to bed at approximately 11:00 a.m.  At  that time, she was last seen normal.  The patient at that time also woke  up approximately 10 minutes later.  She stated that to her husband that  her right side felt weak.  She possibly fell down at that time, it is  unknown.  The patient went back to bed.  Husband continued to stay up  when he was.  At approximately 3 o'clock in the morning, husband noted  the patient was not acting correctly and he tried to wake her up.  He  noted that the patient was not moving her left side.  She was also mute.  At that time, the patient's husband brought her to [facility].  CT scan showed a hypodense region in the left MCA territory.  Porfirio Mylar  Dohmeier was contacted; however, the patient was outside of window for t-  PA and physicians were instructed to use aspirin as only therapeutic  intervention.  The patient was then transferred to [facility].  At the present time, the patient was mute and she is moving her left  side, but decreased movement on her right side.  She is on 31, 100.  The  patient has failed her swallow screen and will need speech therapy at  this time.  The majority of this history was obtained through notes,  although sister is at bedside, she was not present during the evolution  of events.  [facility] B-team who admitted the patient had left only  a plan, but did not leave any progress note or history of their patient  and  their dictation was not available on computer.    Stroke risk factors include hypertension, CAD, and smoking.   Past medical history includes hypertension, CAD, stent placed in 2005,  rheumatoid arthritis.   MEDICATIONS:  The patient is on aspirin 81 mg IM, metoprolol 100 mg  daily, leflunomide 20 mg daily, Cardizem CD oral 360 mg daily, and  chlorthalidone 25 mg daily.   ALLERGIES:  Negative.   SOCIAL HISTORY:  The patient recently began smoking in the past year.  She states she does not drink, however, alcohol level was 16 on  admission.  Illicit drugs was positive for THC.   Review of systems are negative with the exception of above.   PHYSICAL EXAMINATION:  VITAL SIGNS:  Blood pressure is 163/71, pulse 92,  respiratory rate 12.  MENTAL STATUS:  The patient is alert.  She is mute.  At times  responsive  to commands, but does not follow commands at all times.  PULMONARY:  Clear to auscultation bilaterally.  No rhonchi or wheezing.  CARDIOVASCULAR:  S1 and S2 regular rate and rhythm.  No murmurs.  NECK:  Negative bruits and supple.  CRANIAL NERVES:  Pupils are equal, round, and react accommodating to  light approximately 3-mm and 2-mm.  Conjugate gaze.  She does cross  midline with her gaze, however, she does tend to deviate her eyes to the  left.  External muscles are intact.  She does have a right hemianopsia.  She has a positive right partial facial droop.  Tongue is midline.  She  states she is mute.  Sensation V1-T3 is decreased to pinprick over the  right aspect of her facial.  The shrug head turn is normal.  COORDINATION:  Finger-to-nose, heel-to-shin, fine motor movement, and  gait were unobtainable.  MOTOR:  She does move all extremities spontaneously and purposely heard  spontaneously.  She moves all extremities spontaneously.  She does  withdraw all extremities from pain.  She moves only her left extremity  to purposefully.  Her left extremities upper and lower are  5/5 strength.  Her right extremities upper and lower are 3+-4/5.  She does not have any  tremors, asterixis, fasciculation.  She has no muscle wasting.  Deep  tendon reflexes are 2+ throughout.  She has freckling toe and a  downgoing toe on the left.  She has a positive drift on the right upper  extremity and lower extremity.  Sensation is decreased to pinprick over  her right upper extremity and lower extremity and right base.  Vibratory, proprioception, stereognosis were unobtainable.   IMAGING TEST:  CT scan shows a left hypodense, left MCA territory  infarct.   LABORATORY DATA:  Labs at this time, sodium is 139, potassium 2.4,  chloride is 103, CO2 25, BUN 5, creatinine 0.47, glucose 131, platelets  are 230.  White blood cell count is 6.1, hemoglobin/hematocrit is 0.3  and 35.9.   ASSESSMENT:  At this time, this is a 55 year old female, who was  admitted from [facility] secondary to a left MCA distribution infarct.  At this time, we are unable to tell if it is embolic or thrombotic.  We  will continue the stroke workup which includes MRI, MRA of the head 2-D  echo, carotid Doppler, homocystine, HbA1c, fasting lipid panel.  The  patient most likely will need a Panda as she failed her swallow screen,  and we will continue to follow the patient.   The patient did not receive t-PA as she was outside the window.   Stroke risk factors include hypertension, CAD, smoking.      ______________________________  Felicie Morn, PA-C      C. Lesia Sago, M.D.  Electronically Signed    DS/MEDQ  D:  08/30/2008  T:  08/31/2008  Job:  [id number]

## 2010-09-01 NOTE — Consult Note (Signed)
NAME:  Danielle Cruz, Danielle Cruz NO.:  [id number]   MEDICAL RECORD NO.:  [id number]          PATIENT TYPE:  INP   LOCATION:  3111                         FACILITY:  [facility]   PHYSICIAN:  Marlan Palau, M.D.  DATE OF BIRTH:  [birth year]   DATE OF CONSULTATION:  08/30/2008  DATE OF DISCHARGE:                                 CONSULTATION   CHIEF COMPLAINT:  Left MCA territory CVA.   HISTORY OF PRESENT ILLNESS:  This is a 55 year old African American  female who apparently went to bed on Aug 29, 2008, woke up at  approximately 11 p.m., noted weakness in her right leg and had a  questionable fall.  The patient's husband woke up at that time, brought  her back to bed, where she fell back at sleep at approximately 3 a.m.  Husband still noticed that the patient was not acting normal, as she was  not moving her right side while he was in bed.  When he tried to wake  her, he noted that she was alert, but nonresponsive/mute.  At that time,  husband brought his wife to [facility] where she had a CT scan,  which showed a hypodense left MCA territory region.  Physician at that  time called Porfirio Mylar Dohmeier at approximately 4:59 in the morning, and  received orders to use aspirin as the only intervention as the patient  was outside the window for TPA.  The patient was then transferred to  [facility] for 3100.  At the present time, the patient is not  intubated.  The patient is alert.  She is able to move all extremities,  but tends to neglect her right side.  She is mute; however, her sister  is at bedside, but was not able to provide much information, as she was  not present during the time.  The majority of this history was obtained  from the ED report as the note from the [facility] PMD only provided  plan, provided no information, and dictation was not available.   Stroke risk factors include hypertension, CAD, and smoking.   PAST MEDICAL HISTORY:  1.  Hypertension.  2. CAD, stent placement in 2005.  3. Rheumatoid arthritis.   MEDICATIONS:  1. Aspirin 81 mg.  2. Multivitamin.  3. Oral iron.  4. Hydrocodone.  5. Acetaminophen.  6. Metoprolol 100 mg daily.  7. Chlorthalidone 25 mg daily.  8. Leflunomide oral 20 mg daily.  9. Cardizem CD 360 mg daily.   ALLERGIES:  No known drug allergies.   SOCIAL HISTORY:  The patient is married.  She has recently picked up  smoking in the last year and reports that she does not drink; however,  her alcohol level was 69 on admission, and she was also positive for  THC.  TPA was not administered at this time secondary to the patient  being outside of window.   PHYSICAL EXAMINATION:  VITAL SIGNS:  Blood pressure is 163/71, pulse 92,  respiratory rate 12.  PULMONARY:  Clear to auscultation bilaterally.  No rhonchi or wheezing.  CARDIOVASCULAR:  S1 and S2.  Regular rate and  rhythm.  NECK:  Negative for bruits.  Supple.  NEUROLOGIC:  Mental Status:  The patient is alert, however, lethargic.  She is mute.  At times, she can follow commands such as blinking her  eyes and holding an arm up, where as other times, she does not follow  commands.  Cranial nerves:  Pupils are equal, round, and reactive  accommodating to light   Dictation ended at this point.     ______________________________  Felicie Morn, PA-C      C. Lesia Sago, M.D.  Electronically Signed    DS/MEDQ  D:  08/30/2008  T:  08/31/2008  Job:  [id number]

## 2010-09-01 NOTE — H&P (Signed)
NAME:  Danielle Cruz, Danielle Cruz                  ACCOUNT NO.:  [id number]   MEDICAL RECORD NO.:  [id number]          PATIENT TYPE:  INP   LOCATION:  3111                         FACILITY:  [facility]   PHYSICIAN:  Pedro Earls, MD     DATE OF BIRTH:  07/10/1955   DATE OF ADMISSION:  08/30/2008  DATE OF DISCHARGE:                              HISTORY & PHYSICAL   PRIMARY CARE PHYSICIAN:  Dr. Garnette Czech.   CARDIOLOGIST:  Gerrit Friends. Dietrich Pates, MD, [facility]   RHEUMATOLOGIST:  Areatha Keas, MD   CHIEF COMPLAINT:  Acute right-sided weakness.   HISTORY OF PRESENT ILLNESS:  This is a 55 year old African American  female patient with a past medical history significant for coronary  artery disease, hypertension, and rheumatoid arthritis who was brought  in by son with a chief complaint of right-sided weakness and facial  droop.  According to the ER records and the family members apparently,  the patient was last seen normal by her husband at 11:30 and when the  patient fell out of bed at around 2 o'clock could not use her right arm.  Subsequently, the patient was put back in the bed and went back to  sleep.  Although, the patient had been confused and did not talk and  continue to have right upper extremity weakness.  The husband had called  the son to pick her up to bring her to the hospital.   There were no other acute events noted.  There were no other reports of  chest pain, nausea, vomiting, fever, chills, or shortness of breath.   REVIEW OF SYSTEMS:  As above, rest of the review of systems are  negative.   PAST MEDICAL HISTORY:  1. Coronary artery disease status post PTCA in 2005.  2. Rheumatoid arthritis.  3. Hypertension.   PAST SURGICAL HISTORY:  Placement of a stent in 2005.   SOCIAL HISTORY:  Nonalcoholic, no IV drug abuse.  The patient smokes,  quantity unknown.   MEDICATION:  1. Aspirin 81 daily.  2. Multivitamin 1 tablet daily.  3. Iron 1 tablet daily.  4. Hydrocodone.  5. Tylenol p.r.n.  6. Metoprolol 100 daily.  7. Chlorthalidone 25 mg daily.  8. Leflunomide 20 mg daily.  9. Cardizem 360 mg daily.   ALLERGIES:  NKDA.   PHYSICAL EXAMINATION:  VITAL SIGNS:  Temperature, the patient was  afebrile, respirations 16, pulse 90, blood pressure 140s-160s over 80s,  and pulse ox 100% on room air.  GENERAL:  The patient is awake, but confused and disoriented, is  nonverbal, and does not respond to any verbal commands.  The patient has  some involuntary movements while talking to her.  HEENT:  Pupil equal, round, and reactive to light.  No icterus, no  pallor.  Extraocular movements intact.  Oral mucosa is dry.  NECK:  Supple, no JVD, no lymphadenopathy.  CVS:  S1 and S2.  Regular.  No murmur, she has a gallop.  CHEST:  Clear.  ABDOMEN:  Soft and nontender.  Bowel sounds present.  No  hepatosplenomegaly.  EXTREMITIES:  Peripheral pulses  are present.  No clubbing, cyanosis, or  edema.  CNS:  Sensations are difficult to assess as the patient is  uncooperative.  Although the patient respond to the touch and painful  stimuli.  The patient has motor strength of 2+ in right upper extremity  and 1+ in right lower extremity.  Muscle strength is 5+ in left upper  and lower extremity.  SKIN:  No rashes.  MUSCULOSKELETAL:  Difficult to assess as the patient is uncooperative.   LABORATORY DATA:  The patient's troponin less than 0.05.  Comprehensive  metabolic panel reveal potassium of 2.4, glucose of 131, and creatinine  of 0.47.  Alcohol was 16, drug screen none detected.  INR is 1.1, CBC  showed H and H of 12.3 and 35.9 with a left shift.   The patient's EKG showed sinus rhythm with a T wave inversion in V3  through V6.  There is a first-degree AV block.   CT scan of the head showed vague low-attenuation in the left posterior  division of MCA distribution which could represent acute infraction, no  hemorrhage was noted.   IMPRESSION:  1. 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  2. History of coronary artery disease status post stent.  3. Hypertension.  4. Rheumatoid arthritis.   PLAN:  Admit to neurosurgical ICU.  We will use aspirin for now as a  suppository.   Will place [place].   OT, PT, and ST evaluation and Neuro consult, he will have been notified  by the ER at [facility].   This was discussed with the patient's family members.      Pedro Earls, MD  Electronically Signed     NS/MEDQ  D:  08/30/2008  T:  08/31/2008  Job:  [id number]

## 2010-09-01 NOTE — Discharge Summary (Signed)
NAME:  Danielle Cruz, Danielle Cruz                  ACCOUNT NO.:  [id number]   MEDICAL RECORD NO.:  [id number]          PATIENT TYPE:  INP   LOCATION:  3017                         FACILITY:  [facility]   PHYSICIAN:  Charlestine Massed, MDDATE OF BIRTH:  1956-03-08   DATE OF ADMISSION:  08/30/2008  DATE OF DISCHARGE:  09/04/2008                               DISCHARGE SUMMARY   PRIMARY CARE PHYSICIAN:  Milus Mallick. Lodema Hong, M.D. at [facility].   CARDIOLOGIST:  Gerrit Friends. Dietrich Pates, MD, [facility].   RHEUMATOLOGIST:  Areatha Keas, M.D.   NEUROLOGIST:  Pramod P. Pearlean Brownie, MD   REASON FOR ADMISSION:  Acute right-sided weakness.   DISCHARGE DIAGNOSES:  1. Right-sided hemiparesis secondary to left middle cerebral artery      territory acute infarction.  2. Left middle cerebral artery at M1 level possibly due to embolus.  3. Coronary artery disease status post percutaneous coronary      intervention in 2005, currently stable.  4. Hypertension, currently controlled.  5. Rheumatoid arthritis, currently stable.  6. 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   DISCHARGE MEDICATIONS:  The patient is currently being transferred over  to the acute rehab facility in the hospital.  Medications to be  continued are:  1. Norvasc 5 mg p.o. daily.  2. Aspirin 325 mg p.o. daily.  3. DVT prophylaxis with Lovenox 40 mg daily.  4. Zocor 20 mg p.o. q.h.s. via tube.   DISCHARGE DIET:  Dysphagia type 2 diet as prescribed by speech and  swallow.   HOSPITAL COURSE BY PROBLEM:  1. Acute right-sided weakness.  The patient was admitted on Aug 30, 2008 for sudden acute right-sided weakness and he was also found to      have facial weakness.  The patient had a CT scan done to rule out a      bleed and then MRA was done which showed a large acute left      hemispheric infarct was seen and the MRA revealed abrupt cutoff of      flow at the mid M1 segment of the left MCA.  Neurology consult was      done and they  advised the patient to undergo carotid angiography.      Carotid angiogram was done which revealed the same finding of a      cutoff, a truncation of the left middle cerebral artery, the distal      M1 segment, possible thrombus, and there was a 60% to 75% stenosis      of the right internal carotid artery at the bulb and left-sided      angiogram showed there were no other vessel deformities seen.  The      vertebral arteries did not have any issues.  The patient had a TEE      had done today to rule out cardiac source for emboli.  The TEE was      negative for any clots in the heart so the patient was seen by      neurology who ordered to transfer  the patient to the acute rehab as      previously decided.  2. Hypertension, currently controlled.  We will continue Norvasc at      this time.  3. For PT/OT, the patient needs acute CAR and followed by any PT/OT as      decided by the physician at the rehab facility here.  4. Dyslipidemia.  Continue Zocor.   DISPOSITION:  Currently, the patient is being transferred to the acute  rehab facility.  Further disposition at the end of CAR will be decided  by the MD taking over the care of the patient here.      Charlestine Massed, MD  Electronically Signed     UT/MEDQ  D:  09/04/2008  T:  09/04/2008  Job:  [id number]   cc:   Pramod P. Pearlean Brownie, MD  Areatha Keas, M.D.  Gerrit Friends. Dietrich Pates, MD, [facility]  Milus Mallick. Lodema Hong, M.D.

## 2010-09-01 NOTE — Letter (Signed)
May 23, 2007    Milus Mallick. Lodema Hong, M.D.  [address]  [place], [place] [address]   RE:  [name], CULLENS  MRN:  [id number]  /  DOB:  1955/06/10   Dear Claris Che:   Ms. Larkin is woman I follow for coronary disease since a drug-eluting  and bare metal stent was placed in April 2005.  She has had no recurrent  cardiac symptoms.  She is scheduled to establish with you as a new  patient in the near future.  She is also followed by Dr. Phylliss Bob for  rheumatoid arthritis, which has been quite troublesome to her of late.  She is barely able to walk due to right ankle pain.  She has had  hypertension and hyperlipidemia that have been well controlled.  She  continues to smoke cigarettes, but claims consumption of only a few per  day.  She does not appear motivated to stop this entirely.   Current medications include aspirin 81 mg daily, multivitamin, Toprol  100 mg daily, atorvastatin 80 mg daily, methotrexate 2 mg q. week,  Diltiazem 360 mg daily, hydrochlorothiazide 25 mg daily.  She also uses  hydrocodone p.r.n.  She used to be treated with Mobic, but currently  takes no nonsteroidals.   EXAM:  Pleasant woman in no acute distress when seated.  The weight is 155, 5 pounds more than at her last visit in October 2007.  Blood pressure 135/70, heart rate 66 and regular, respirations 18.  NECK:  No jugular venous distention; normal carotid upstrokes without  bruits.  LUNGS:  Clear.  CARDIAC:  Normal first and second heart sounds; fourth heart sound  present.  ABDOMEN:  Soft and nontender; no organomegaly.  EXTREMITIES:  Rheumatoid deformity of both hands; warmth and mild  tenderness over the right ankle medially.   IMPRESSION:  Ms. Hoadley is doing well from a cardiac standpoint except  for continued cigarette smoking.  We will check a lipid profile,  chemistry profile and CBC.  She is to see Dr. Phylliss Bob within the next few  days.  He will address the issue of treatment of further  treatment for  her rheumatoid arthritis.  I suggested Aleve in the interim.  If  laboratory values are good, I will plan to see this nice woman again in  1 year.    Sincerely,      Gerrit Friends. Dietrich Pates, MD, [facility]  Electronically Signed    RMR/MedQ  DD: 05/23/2007  DT: 05/24/2007  Job #: [id number]   CC:    Areatha Keas, M.D.

## 2010-09-01 NOTE — Assessment & Plan Note (Signed)
Danielle Cruz is a 55 year old female with left MCA distribution, CVA  causing aphasia, right hemiplegia, spasticity.  She went through  inpatient rehabilitation stay at [facility], Sep 04, 2008, to [place],  2010.  Her other past medical history is significant for rheumatoid  arthritis.  She has a history of elevated LFTs.  She was discharged home  with min assist ambulation 50 feet x2.  Her main concern per her husband  is upper extremity spasms on the right side.  This limits dressing also,  her arms stay bent up when she walk.   She was tried on some Zanaflex on the inpatient side, concerns regarding  LFTs.  This was discontinued before she went home, has not tried  baclofen.   She has gotten home health through [facility].  She has had no  other medical complications.  She sees Dr. Garnette Czech this month.   REVIEW OF SYSTEMS:  Filled out per her husband.  Weakness, trouble  walking, confusion, limb swelling.   FUNCTIONAL REVIEW:  Still needs assistance with dressing, bathing,  toileting, meal prep, household duties, and shopping.  Her walking  tolerance is 5 minutes.   PHYSICAL EXAMINATION:  VITAL SIGNS:  Blood pressure 135/60, pulse 68,  respirations 16, and O2 sat 94% on room air.  GENERAL:  No acute distress.  Orientation x3.  Affect alert.  She is  using a wheelchair.  We did not test her gait since she did not have her  assisted device with her.  She does go from sit to stand with min  assist.   She has Ashworth 3 at the wrist flexors, thumb IP flexors of 2, and  elbow flexors are 3 on the modified scale.   Motor strength is 0 on the upper extremity.  In the lower extremities, 3-  in the extensor and 2- in the ankle dorsiflexor.   IMPRESSION:  1. 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  2. Aphasia.  She really has no comprehensible sounds, but she is      trying to phonate more.   PLAN:  1. Continue  home health, PT, OT, speech.  2. We will start some baclofen 10 mg p.o. b.i.d. for arm spasm, so I      do not think this will be particularly helpful; however, she      already has trialed some Zanaflex, has been getting intensive      physical therapy.  We will go ahead and schedule for Botox in the      event that the baclofen does not really help much.      Danielle Cruz, M.D.  Electronically Signed     AEK/MedQ  D:  10/29/2008 10:27:47  T:  10/30/2008 02:25:43  Job #:  [id number]

## 2010-09-01 NOTE — H&P (Signed)
NAME:  Danielle Cruz, Danielle Cruz NO.:  [id number]   MEDICAL RECORD NO.:  [id number]          PATIENT TYPE:  INP   LOCATION:  4035                         FACILITY:  [facility]   PHYSICIAN:  Erick Colace, M.D.DATE OF BIRTH:  06-Feb-1956   DATE OF ADMISSION:  09/04/2008  DATE OF DISCHARGE:                              HISTORY & PHYSICAL   PRIMARY PHYSICIAN:  Hettie Holstein, DO   NEUROLOGIST:  Pramod P. Pearlean Brownie, MD   CARDIOLOGIST:  Gerrit Friends. Dietrich Pates, MD, [facility]   RHEUMATOLOGIST:  Areatha Keas, MD   CHIEF COMPLAINT:  Right-sided weakness and inability to speak.   HISTORY OF PRESENT ILLNESS:  This is a 55 year old African American  female with rheumatoid arthritis and hypertension, admitted via [facility] on Aug 30, 2008, with right-sided weakness and aphasia.  MRI/MRA of the brain revealed a large left hemispheric infarct involving  the left temporal area, left basal ganglia, and to the corona radiata  involving portions of left frontal and parietal lobes as well.  She had  abrupt cutoff of the left mid MCA.  A cerebral angio was performed and  showed occluded left MCA with clot extending into the left MCA  trifurcation branches and 55% right ICA stenosis.  A 2-D echo, which was  unremarkable.  TEE is today.   Therapies were initiated.  The patient with severe apraxia and aphasia,  poor attention, and arousable.  She has severe dysphagia as well and is  placed on D1 nectar-thick liquid diet for pharyngeal and oral  difficulties.  The patient continues to have substantial right  hemiparesis.  Rehab evaluated the patient yesterday and felt that she  could potentially benefit from an inpatient admission given her social  supports.  Thus, she was brought today.   REVIEW OF SYSTEMS:  Notable for some joint swelling, weakness.  Other  items were unobtainable due to her aphasia.  Full review is in the  written H and P.   PAST MEDICAL HISTORY:  Positive for RA, CAD with  PTCA in 2005, right  ankle arthroscopy, and I and D of spurs, increased lipids.   FAMILY HISTORY:  Positive for CAD and cancer.   SOCIAL HISTORY:  The patient is married, lives in a 1-level house, 3  steps to enter.  She is disabled secondary to rheumatoid arthritis prior  to arrival.  She smokes 1 pack a day cigarettes and occasionally drinks.  Husband is self-employed Curator, and she has 3 children at home who  can assist at discharge, who are 6, 64, and 55 years old.   ALLERGIES:  None.   HOME MEDICATIONS:  Aspirin, multivitamin, iron, metoprolol,  chlorthalidone with leflunomide and Cardizem.   LABORATORY DATA:  Hemoglobin 12.7, white count 5.4, platelets 209.  Sodium 135, potassium 4.4, BUN 4, creatinine 0.5.   PHYSICAL EXAMINATION:  VITAL SIGNS:  Blood pressure is 165/89, pulse is  74, respiratory rate 19, temperature 97.9.  GENERAL:  The patient is flat and hard to arouse today.  She is  generally lethargic.  Pupils are reactive to  light.  Ear, nose, and  throat exam is notable for coating over the tongue and borderline  dentition.  NECK:  Supple without JVD or lymphadenopathy.  CHEST:  Clear to auscultation bilaterally without wheezes, rales, or  rhonchi.  The patient had 2+ edema in the right hand and 1+ at the  elbow.  ABDOMEN:  Soft, nontender.  Bowel sounds are positive.  MUSCULOSKELETAL:  Notable for ulnar deviation of bilateral wrists and  changes in the MCP secondary to rheumatoid arthritis.  NEUROLOGIC:  Cranial nerves II through XII are normal with notable right  central VII, tongue deviation.  I am unable to distinguish visual  fields.  She is having some difficulties with attention to the left.  The patient is aphasic both expressively and receptively.  She has the  absent sensation on the right arm and leg today.  Seemed withdrawal to  pain on the left.  She has 0/5 strength in the right upper extremity and  grossly 4-5/5 in the left upper and lower  extremity today.  Judgment,  orientation, memory, and high level cognitive function are not able to  be assessed due to her language deficits.  The patient may have some  apraxia as well.  The patient does seem to respond somewhat to tactile  and verbal cues and can make eye contact with cuing.   POST ADMISSION PHYSICIAN EVALUATION:  1. Functional deficit secondary to left MCA infarct with dense right      hemiparesis, hemisensory loss, and aphasia.  2. The patient is admitted to receive collaborative interdisciplinary      care between the physiatrist, rehab nursing staff, and therapy      team.  3. The patient's level of medical complexity and substantial therapy      need in context of that medical necessity cannot be provided at a      lesser intensity of care.  4. The patient experienced substantial functional loss from her      baseline.  Premorbidly, the patient was independent and driving      without any type of assistive devices.  Within the last 24 hours,      the patient has been evaluated by physical, speech, and      occupational therapy and requiring max to total assistance with bed      mobility, transferring, standing with knee locked, and total assist      with all ADLs.  Judging by the patient's diagnosis, physical exam,      and functional history, she has a potential for a functional      progress, which will result in measurable gains while in inpatient      rehab.  These gains will be of substantial and practical use upon      discharge to home in facilitating mobility and self-care.  Interim      changes in medical status since our consult are detailed in the      history of present illness above.  5. Physiatrist will provide 24-hour management of medical needs, as      well as oversight of therapy plan/treatment, and provide guidance      as appropriate regarding interaction of the 2.  Medical problem      list and plan is listed below.  6. 24-hour rehab  nursing will assist in management of the patient's      skin care needs, as well as bowel and bladder, nutrition, mood,  pain management, and medication administration, as well as      integrate therapy concepts and techniques.  7. PT will assess and treat for lower extremity strength, functional      mobility, range of motion, tone control with appropriate splinting.      Family education and the patient education as applicable.  Goals      are min assist.  8. OT will assess and treat for upper extremity use, adaptive      equipment technique, neuromuscular reeducation, cognitive      perceptual training, and adaptive techniques and devices with goals      min assist to occasional mod assist.  9. Speech Language Pathology will assess and treat for severe language      and swallowing deficits with goals modified independent to min      assist.  10.Case management/social worker will assess and treat for      psychosocial issues and discharge planning.  11.Team conferences will be held weekly to assess progress towards the      goals and to determine barriers to discharge.  12.The patient has demonstrated sufficient medical stability and      exercise capacity to tolerate at least 3 hours of therapy per day      at least 5 days per week.  13.Estimated length of stay is 3-4 weeks.  Prognosis fair to good.   MEDICAL PROBLEM LIST AND PLAN:  1. Coronary artery disease:  We will follow the patient's tolerance of      therapy as her activity increases.  The patient is currently on      aspirin and Zocor for medicinal management.  2. Rheumatoid arthritis:  The patient is off medications currently.      We will discuss resumption at appropriate time considering her      rehab progress and course.  3. Dysphagia:  The patient on D1 nectar-thick liquid diet.  IV fluid      is in place at nighttime.  She has supplemental hydration.  We will      need to work on oral care as well.  We will  continue to push p.o.      and hopefully advance diet as she recovers neurologically.  4. Hypertension:  We will resume low-dose Cardizem as blood pressures      have been labile and actually is high today.  Hypertension is a      substantial risk factor for further strokes and needs to be managed      tightly.  5. Abnormal LFTs:  We will recheck numbers in the morning.      Ranelle Oyster, M.D.  Electronically Signed      Erick Colace, M.D.  Electronically Signed    ZTS/MEDQ  D:  09/04/2008  T:  09/05/2008  Job:  [id number]

## 2010-09-01 NOTE — Op Note (Signed)
NAME:  Danielle Cruz, Danielle Cruz                  ACCOUNT NO.:  [id number]   MEDICAL RECORD NO.:  [id number]          PATIENT TYPE:  AMB   LOCATION:  DAY                           FACILITY:  [facility]   PHYSICIAN:  R. Roetta Sessions, M.D. DATE OF BIRTH:  06-14-1955   DATE OF PROCEDURE:  11/29/2008  DATE OF DISCHARGE:                               OPERATIVE REPORT   PROCEDURE:  Colonoscopy with snare polypectomy.   INDICATIONS FOR PROCEDURE:  A 55 year old lady with positive family  history of colon cancer in her father who has had intermittent rectal  bleeding since May of this year after suffering a CVA.  She has never  had her lower GI tract evaluated.  She has had stuttering rectal  bleeding.  She saw Dr. Margretta Ditty in the ED yesterday.  Her hemoglobin and  hematocrit remained low but stable at 10.4 and 31.0 today.  Colonoscopy  is now being done to further evaluate her symptoms.  This approach has  discussed with the patient at length.  Risks, benefits, alternatives,  and limitations have been discussed, questions answered.  Please see  documentation in medical record.   PROCEDURE NOTE:  O2 saturation, blood pressure, pulse and respirations  were monitored throughout the entire procedure.  Conscious sedation  Versed 4 mg IV, Demerol 75 mg IV in divided doses.   INSTRUMENT:  Pentax video chip system.   FINDINGS:  Digital rectal exam revealed no abnormalities.   ENDOSCOPIC FINDINGS:  The prep was adequate.   Colon:  Colonic mucosa was surveyed from the rectosigmoid junction  through the left, transverse, and right colon to the area of the  appendiceal orifice, ileocecal valve and cecum.  These structures were  well seen and photographed for the record.  From this level, the scope  was slowly and cautiously withdrawn.  All previous mentioned mucosal  surfaces were again seen.  The patient had left-sided diverticula.  In  the mid sigmoid colon, there was a pedunculated polyp on a very long  stalk.   This polyp appeared to have a hyperplastic head but was friable.  Please see photos.  It was engaged with the snare and removed with hot  snare cautery technique.  Remainder of the colonic mucosa appeared  unremarkable.  The scope was pulled down in the rectum where thorough  examination of the rectal mucosa including retroflexed view of the anal  verge demonstrated no abnormalities.  The patient tolerated the  procedure well and was reactive to endoscopy.  Cecal withdrawal time 11  minutes 30 seconds.   IMPRESSION:  1. Normal rectum.  2. Left-sided diverticula, pedunculated polyp mid sigmoid colon status      post hot snare removal.  Remainder colonic mucosa appeared normal.      I suspect the sigmoid polyp has been producing hematochezia.   RECOMMENDATIONS:  1. No aspirin or arthritis medications for the next 5 days.  2. Follow up on pathology.  3. Diverticulosis literature provided to Ms. Hautala and her husband.      She ought to consider taking a daily fiber supplement such  as      Benefiber Metamucil or Citrucel.      Jonathon Bellows, M.D.  Electronically Signed     RMR/MEDQ  D:  11/29/2008  T:  11/29/2008  Job:  [id number]   cc:   Rhae Lerner. Margretta Ditty, M.D.  [address]  [place]  [place] [address]   Milus Mallick. Lodema Hong, M.D.  Fax: [phone]

## 2010-09-01 NOTE — Discharge Summary (Signed)
NAME:  Danielle Cruz, Danielle Cruz                  ACCOUNT NO.:  [id number]   MEDICAL RECORD NO.:  [id number]          PATIENT TYPE:  IPS   LOCATION:  4035                         FACILITY:  [facility]   PHYSICIAN:  Erick Colace, M.D.DATE OF BIRTH:  [birth year]   DATE OF ADMISSION:  09/04/2008  DATE OF DISCHARGE:  10/02/2008                               DISCHARGE SUMMARY   DISCHARGE DIAGNOSES:  1. 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  2. Coronary artery disease.  3. Rheumatoid arthritis.  4. Dysphagia resolved.  5. Abnormal LFTs, stable.   HISTORY OF PRESENT ILLNESS:  [name] is a 55 year old female with  history of RA, hypertension, admitted via [facility] on Aug 30, 2008, with right-sided weakness and inability to speak.  MRI/MRA of  brain showed large left hemisphere infarct affecting left temporal with  basal ganglia at corona radiata involving portions of left frontal and  parietal lobe and abrupt cut off and mid left MCA.  Cerebral angio  showed occluded left MCA with clot extending into left MCA trifurcation  branches and 65% right ICA stenosis proximally.  A 2-D echo showed EF of  55% to 65%.  TEE done showed no source of emboli.  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  She is noted to  have poor sustained attention with decreased awareness, decrease in  problem solving, as well as moderate-to-severe dysphasia with positive  aspiration of 10 pH.  She is currently on D1 diet nectar liquids.  She  continues with dense right hemiparesis.  Her overall balance is  improved, but continues to be impaired.  She requires assist with left  side for mobility, requires right knee block to prevent buckling with  standing attempts.  The patient was evaluated by rehab and felt to be an  appropriate candidate for CIR program.  Thus admitted to  rehab on Sep 04, 2008.   PAST MEDICAL HISTORY:  Significant for RA with chronic bilateral ankle  and foot pain, greater than bilateral knee pain, coronary artery disease  with PTCA in 2005, right ankle arthroscopy with I&D of spurs,  dyslipidemia.   ALLERGIES:  No known drug allergies.   REVIEW OF SYMPTOMS:  Notable for joint swelling and weakness limited  secondary to patient's aphasia.   FAMILY HISTORY:  Positive for coronary artery disease and cancer.   SOCIAL HISTORY:  The patient is married, lives in one-level home with 3  steps at entry.  Is disabled due to rheumatoid arthritis.  She smokes  one-pack tobacco a day.  Does use alcohol occasionally.  Husband is a  self-employed Curator.  She has 3 children at home who can assist past  discharge.   FUNCTIONAL HISTORY:  The patient was independent without assistive  device prior to admission.  She still drives.   PHYSICAL EXAMINATION:  VITALS:  Blood pressure 165/89, pulse 74,  respiratory rate 18, temperature 97.9.  GENERAL:  The  patient is flat not to arouse, being generally lethargic.  HEENT:  Pupils equal and reactive to light.  Ear, nose, and throat exam  noted for coating on tongue with borderline dentition.  NECK:  Supple without JVD or lymphadenopathy.  CHEST:  Clear to auscultation bilaterally without wheezes, rales, or  rhonchi.  ABDOMEN:  Soft, nontender with positive bowel sounds.  MUSCULOSKELETAL:  Notable for ulnar deviation bilateral wrists and  changes in MCP secondary to rheumatoid arthritis.  She has 2+ edema  right hand, 1+ right elbow.  NEUROLOGIC:  Cranial nerves II through XII are normal with a notable  right central VII with tongue deviation.  Unable to distinguish visual  fields.  The patient with some difficulties with attention to the left.  She has expressive receptive aphasia and is mute.  She has decreased  sensation on right arm, right leg, seems to withdraw to pain on left.  She has 0-5  strength right upper extremity and grossly 4-to 5/5 left  upper and left lower extremity.  Judgment, orientation, memory, and high-  level cognitive functions are not assessed due to language deficits.  The patient may have some apraxia additionally.  She does respond  somewhat to tactile and verbal cues and can make eye contact with cuing.   HOSPITAL COURSE:  [name] was admitted to rehab on Sep 04, 2008,  for inpatient therapies to consist of PT, OT, and Speech Therapy at  least 3 hours 5 days a week.  Past admission, physiatrist, rehab RN, and  therapy team have worked together to provide customized collaborative  interdisciplinary care.  Rehab RN has been working with the patient on  assisting her to maintain her hydration status especially as the patient  was on nectar-thick liquids at the time of admission.  The patient was  also started on IV fluids at bedtime from 7 p.m. to 7 a.m. until diet  advanced.  Initially, the patient was maintained on D1 diet with full  supervision with strict aspiration precautions.  The patient's blood  pressures were monitored on b.i.d. basis as it was noted to be labile  with systolics in 140s to 150s range and Toprol-XL 25 mg daily was  resumed past admission.  Blood pressures at the time of discharge  improved ranging from 110s to 120s systolics, 70s to 80s diastolic.  Heart rate has been in 70s to 80s range.  The patient has been afebrile  during this stay.  Labs were done past admission revealing hemoglobin  12.4, hematocrit 36.3, white count 6.3, platelets 202.  Check of lytes  revealed sodium 135, potassium 4.0, chloride 106, CO2 of 20, BUN 7,  creatinine 0.56, glucose 84.  LFTs were noted to be elevated with AST  74, ALT 55, total protein 7.5, and albumin 3.1.  The patient's LFTs have  been monitored with recheck on Aug 31, 2008, showing improvement with  AST 46, ALT 44, total bili 0.5.  The patient's Ranae Plumber was resumed past  admission.   Once the patient's diet advanced to D2 thin liquids on Sep 10, 2008, the patient's IV fluids were discontinued and her renal status  has been monitored along throughout the stay.  BMET of Eulanda 15, 2010,  revealed sodium 140, potassium 3.7, chloride 104, CO2 of 26, BUN 5,  creatinine 0.51, glucose 99.  CBC prior to discharge shows hemoglobin  12.0, hematocrit 36.2, white count 4.0, platelets 190.  The patient was  noted to have some issues  with constipation with bright red blood per  rectum due to hemorrhoidal flare.  Anusol-HC cream was ordered; however,  the patient has expressed dislike of this.  Her bowel program has been  adjusted.  The patient is discharged with ProctoFoam-HC cream prescribed  with family instructed to use this on b.i.d. basis for the next few days  to help with hemorrhoidal flare.  Her H&H has been checked and is noted  to be stable.  Last check of LFTs of Sep 13, 2008, reveals AST 52, ALT  56, albumin 3.1, total bili 0.3.  The patient to have followup LFTs by  primary care physician past discharge.   The patient has had modified barium swallow done during this stay to  monitor the patient's swallowing function.  Last of Sep 10, 2008,  reveals improved swallow function with min to mod oral and minimal  pharyngeal dysphagia without penetration or aspiration of any tested  boluses.  Her oral dysphagia was resistant of her moderate severe oral  apraxia resulting in poor bolus control and decreased labial lingual  movement.  The patient was advanced to D2 diet thin liquids at this  time.  By the time of discharge, the patient had been further upgraded  to D3 diet thin liquids with intermittent supervision.   During the patient's stay in rehab, weekly team conferences were held to  monitor the patient's progress, set goals as well as discuss barriers to  discharge.  At the time of admission, the patient was highly  inconsistent in yes/no nods for basic biographical  information.  She  required max total assist for one-step command.  She was unable to  produce any voice and required max cues to open her mouth.  Complex  level, comprehension, expression were not tested.  The patient was able  to focus attention with max verbal cues for basic tasks.  She was unable  to produce any phonation.  By the time of discharge, the patient  required mod cues for 90% accuracy for basic yes/no questioning, she was  responding with increased accuracy with clear concrete visual choices  and contacts for basic information.  She was able to mouth words  occasionally with attempts to open mouth and phonate with no voicing  noted.  She was able to point and jester with supervision to express  needs and wants.  She was able to spontaneously produce phonetic  laughter.  The patient was able to the write her name independently,  copy single, mono, and bisyllabic words with cues for correction.  She  was able to match objects with 70% accuracy, able to match pictures with  mod assist.  She requires supervision to min assist for selective  attention.  She appeared to be aware of deficits with attempts to self  correct errors in functional tasks and with written commands.  She  required min to mod cues for sequencing, decision making, as well as  self monitoring, and correcting for errors.  PT evaluation revealed the  patient limited by right hemiplegia with some mild increase in right  lower extremity tone, motor apraxia, decreased attention, decreased  safety awareness, poor sitting and standing balance with decreased  initiation of movement.  The patient was max assist for supine to  sitting, max assist plus for sit to stand, max assist to attain sitting  balance for greater than 10 seconds.  Physical Therapy worked with the  patient on pre-gait as well as gait activities with the patient  improving her ability to achieve and sustain right hip and knee  extension.  She was  noted to have improve in initiation of right swing  stance requiring right physical assist to clear her toe due to weak  dorsiflexions.  By the time of discharge, the patient was able to  navigate wheelchair 250 feet x2 with supervision.  She was min assist  for sit to stand, able to ambulate 50 feet x2 with left wide base quad  cane, and right toe lift with min assist.  Able to navigate 5 steps x2  with 1 rail with min assist for hip flexion on right.  PT has been  working with the patient with right lower extremity stretches to help  with as well as lower extremity strengthening exercises.  Family  education was done with husband and son to include ambulation in  controlled environment free of clutter and obstacles.  Family is able to  provide assist supervision needed for all mobility.  OT evaluation  revealed the patient with decreased focus attention with lethargy as  well as visual perceptual deficits with question of diplopia, apraxia,  decrease in head control, as well as right-sided weakness impairing her  ability for self-care.  The patient was noted to have decrease in head  control with tendency to hold head as if in pain.  She was max assist do  don shirt with increased apraxia.  She was noted to have increased  apraxia with oral clear requiring total assist.  ADL at edge of bed, I  have focused on sustained attention, truncal control, as well as sitting  balance.  The patient was noted to have discomfort with therapy  initially, especially in a.m. due to discomfort from her arthritis.  She  was started on Tylenol on q.i.d. basis with p.r.n. Ultram for pain  management, and this has been effective in controlling her pain  symptomatology.  OT has worked with the patient in [place] to help  improve independence in self-feeding.  They have worked with helping her  increase her attention to the right as well as cuing for pocketing of  food on the right.  ADL retraining at  shower level has focused on  attention, motor planning, as well as initiation of self-care.  By the  time of discharge, the patient was able to respond appropriately to  gesture during bathing and dressing.  She had progressed to being at min  assist set up for basic self-care functional mobility.  She continued to  be limited by right hemiparesis with tone, aphasia, and perceptual  deficits.  Family education was done with husband, daughter, and son  with hands-on training with daughter on sit-to-stand transfers,  toileting transfers, toileting, as well as bathing and dressing at sink.  Family and the patient have also worked on hemi-dressing techniques as  well as tub bench transfers.  The patient will continue to receive  further followup home health PT, OT, and speech therapy by [facility] past discharge.  On Annibelle 16, 2010, the patient is discharged to  home.   DISCHARGE MEDICATIONS:  1. Zocor 20 mg at bedtime.  2. Coated aspirin 325 mg a day.  3. Norvasc 10 mg a day.  4. Ultram 50 mg q.i.d.  5. Arava 20 mg a day.  6. Toprol-XL 25 mg a day.  7. Ditropan 5 mg half p.o. b.i.d.  8. Dulcolax tabs 2 p.o. b.i.d.  9. Multivitamin once a day.  10.Iron  supplements daily.  11.Milk of magnesia 30 mL if no BM in 2 days, foamy cream rectally      b.i.d. at least for 2 days then p.r.n. hemorrhoidal flare-up.   DIET:  Regular, soft, and no straws.   ACTIVITIES:  24-hour supervision.   SPECIAL INSTRUCTIONS:  No alcohol, no smoking, no driving.  Do not use  chlorthalidone, Cardizem CD, or plain metoprolol.  [facility] to  provide PT, OT, Speech Therapy, and nurse.   FOLLOWUP:  The patient to follow up with Dr. Reyes Ivan on October 29, 2008, at  8:45.  Follow up with Dr. Angelena Sole for routine check in 2 weeks.  Follow  up with Dr. Dietrich Pates for routine check.  Follow up with Dr. Pearlean Brownie in 4  weeks.      Greg Cutter, P.A.      Erick Colace, M.D.  Electronically  Signed    PP/MEDQ  D:  10/08/2008  T:  10/09/2008  Job:  [id number]   cc:   Areatha Keas, M.D.  Dr. Angelena Sole  Dr. Pearlean Brownie

## 2010-09-01 NOTE — Procedures (Signed)
NAME:  Danielle Cruz, Danielle Cruz                  ACCOUNT NO.:  [id number]   MEDICAL RECORD NO.:  [id number]           PATIENT TYPE:   LOCATION:                                 FACILITY:   PHYSICIAN:  Erick Colace, M.D.DATE OF BIRTH:  [birth year]   DATE OF PROCEDURE:  DATE OF DISCHARGE:                               OPERATIVE REPORT   A 55 year old female with left MCA distribution infarct, causing right  hemiparesis, aphasia, spasticity.  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  Her upper extremity  spasticity interferes with dressing.  She has painful spasms in the  right upper extremity that are not responsive to Zanaflex, baclofen or  PT/OT.   PROCEDURE:  This is a right upper extremity botulinum toxin injection  under needle EMG guidance.   Informed consent was obtained after describing the risks and benefits of  the procedure with the patient.  These include bleeding, bruising,  infection, side effects from medications itself.  She also reviewed and  signed with the assistance of her husband the Botox REMS.  She elects to  proceed and has given written consent.  The patient in a seated position  2 areas over the right FCR, 2 areas over the right FCU, 1 area of the  right FPL, 1 area over the right FDL and 2 areas of the right FDS were  marked and prepped with Betadine and alcohol, entered with a 26-gauge 50-  mm needle electrode, 25 units of botulinum toxin-type A was injected  into each site.  This was done after appropriate EMG activity and  negative drawback for blood.  The patient tolerated the procedure well.  Dilution was 50 units/mL.  Post-injection instructions given.  I will  see her back in 1 month in followup.      Erick Colace, M.D.  Electronically Signed     AEK/MEDQ  D:  12/16/2008 13:38:03  T:  12/17/2008 03:14:54  Job:  [id number]

## 2010-09-03 ENCOUNTER — Encounter: Payer: Self-pay | Admitting: Family Medicine

## 2010-09-04 NOTE — Consult Note (Signed)
NAME:  ROSITA, GUZZETTA NO.:  [id number]   MEDICAL RECORD NO.:  [id number]                   PATIENT TYPE:  INP   LOCATION:  3727                                 FACILITY:  [facility]   PHYSICIAN:  Vida Roller, M.D.                DATE OF BIRTH:  11-14-1955   DATE OF CONSULTATION:  07/23/2003  DATE OF DISCHARGE:                                   CONSULTATION   CARDIOLOGY CONSULTATION   PRIMARY CARE Nevelyn Mellott:  Dr. Katrinka Blazing in [place], [place].   HISTORY OF PRESENT ILLNESS:  [name] is a 55 year old female who presents  to the ER complaining of chest discomfort.  It started about 1 o'clock in  the morning.  She noted diaphoresis.  This progressed to chest discomfort,  she was unable to describe the pain, but there was no relieving her  exacerbating symptoms.  EMS was called.  She was given 4 baby aspirin and a  sublingual nitroglycerin which relieved the pain.  EKG reveals ST segment  elevation in the lateral leads with ST depression in the inferior leads.  She was given nitroglycerin drip and has done well since then.  The  secondary EKG done about 45 minutes after the initial EKG shows no evidence  of ST segment elevation.   PAST MEDICAL HISTORY:  Her past medical history is significant for  hypertension, rheumatoid arthritis which she has treated with prednisone.  She has had a partial hysterectomy.  She has hyperlipidemia, but it is not  treated.   MEDICATIONS:  She is on blood pressure medication that she only takes on an  intermittent basis, and does not know the name of.  She takes prednisone,  she is not sure of the dose and pain pills.   SOCIAL HISTORY:  She lives in [place] with her husband.  She is disabled  secondary to arthritis.  She is married.  She has 3 children.  She smokes  about a pack a day and has for the last 20 years. She drinks occasionally on  the weekends, denies any drug use.   CURRENT MEDICATIONS:  1.  Currently, in the hospital, she is on aspirin 325 a day.  2. Lovenox 70 mg q.12h.  3. Lopressor 12.5 mg b.i.d.   FAMILY HISTORY:  Her mother died at age 72 during childbirth.  Father had  died of cancer at an unknown age.  She has 11 siblings.  One brother had had  a heart attack at age 66.   REVIEW OF SYSTEMS:  Her review of systems is essentially unremarkable except  for that mentioned in the history of present illness.  She does occasionally  have arthralgias with her rheumatoid arthritis.   PHYSICAL EXAMINATION:  GENERAL:  On physical exam she is a well-developed,  well-nourished, African-American female in no apparent distress.  MENTAL STATUS:  She is alert and oriented x4.  VITAL SIGNS:  Her pulse is 76; blood pressure is 119/79; respiratory rate is  18.  She is saturating 99% on 2 liters by nasal cannula.  She is afebrile.  HEENT:  Examination of the head, eyes, ears, nose, and throat is  unremarkable.  NECK:  Her neck is supple.  She has no jugular venous distention or carotid  bruits.  CHEST:  Her chest is clear to auscultation.  CARDIAC:  Cardiac exam reveals a nondisplaced point of maximal impulse.  First and second heart sounds are normal.  There is no third heart sound,  but there is an easily heard fourth heart sound.  No murmurs are noted.  LUNGS:  Her lungs are clear to auscultation.  ABDOMEN:  Abdomen is soft, nontender, normoactive bowel sounds.  GENITOURINARY AND RECTAL EXAM AND COMPLETE BREAST EXAM:  All deferred.  EXTREMITIES:  Her extremities are without significant clubbing, cyanosis, or  edema.  Her pulses are intact bilaterally.  MUSCULOSKELETAL EXAM AND NEUROLOGIC EXAM:  Unremarkable.   CHEST X-RAY:  Her chest x-ray was reported as negative.   ELECTROCARDIOGRAM:  Initial electrocardiogram is quite impressive with sinus  rhythm at a rate of 85 with a normal axis and normal intervals, but ST  segment elevation in leads I and AVL and lead V6, with ST segment  depression  in II, III, and aVF.  A second EKG shows resolution of those ST-T wave  changes.   LABORATORIES:  White blood cell count 7.6, H&H of 13 and 40 with a platelet  count of 238.  Sodium 135, potassium 3.9, chloride 102, bicarb 22, BUN 9,  creatinine 0.7, blood glucose of 111.  First set of cardiac enzymes:  CK of  67, MB fraction 0.9, troponin 0.03.  PTT 25, INR 1.0.  No urine drug screen  was performed.   ASSESSMENT:  1. So, we have a woman with chest pain and ST segment elevation.  Cardiac     enzymes were negative.  Quite concerning for coronary spasm which     potentially could be cocaine induced.  Certainly would do a urine drug     screen to ensure that we are not dealing with that particular entity as     beta blockers are contraindicated.  2. Rheumatoid arthritis which appears to be reasonably well-controlled on     prednisone.  3. Hypertension is also well-controlled and her lipids are pending.   PLAN:  My plan is to transport her to [facility], anticoagulate her and she  will need a heart catheterization as soon as possible.      ___________________________________________                                            Vida Roller, M.D.   JH/MEDQ  D:  07/23/2003  T:  07/23/2003  Job:  [id number]

## 2010-09-04 NOTE — Cardiovascular Report (Signed)
NAME:  Danielle, Cruz NO.:  [id number]   MEDICAL RECORD NO.:  [id number]                   PATIENT TYPE:  INP   LOCATION:  6526                                 FACILITY:  [facility]   PHYSICIAN:  [name], M.D. [facility]         DATE OF BIRTH:  1956/02/29   DATE OF PROCEDURE:  07/24/2003  DATE OF DISCHARGE:                              CARDIAC CATHETERIZATION   PROCEDURE PERFORMED:  1. Left heart catheterization with coronary angiography and left     ventriculography.  2. PTCA with placement of a drug-eluting stent in the first obtuse marginal     branch.  3. PTCA with placement of a bare metal stent in the distal left anterior     descending artery.   INDICATION:  Danielle Cruz is a 55 year old woman who presented to [facility] yesterday morning with chest pain and initial ST segment elevation  in the inferolateral leads, which resolved with medical therapy.  She did  rule in for a small non-Q wave myocardial infarction.  After stabilization,  she was referred for cardiac catheterization.   CATHETERIZATION PROCEDURAL NOTE:  A #6 French sheath was placed in the right  femoral artery.  Coronary angiography was performed with a standard Judkins  #6 French catheters.  Left ventriculography was performed with an angled  pigtail catheter.  Contrast was Omnipaque.  There were no complications.   RESULTS:   HEMODYNAMICS:  1. Left ventricular pressure 145/12.  2. Aortic pressure 150/82.  3. There was no aortic valve gradient.   LEFT VENTRICULOGRAM:  Wall motion is normal.  Ejection fraction estimated at  greater than or equal to 65%.  There is no mitral regurgitation.   CORONARY ARTERIOGRAPHY (CO-DOMINANT):  1. Left main is normal.  2. Left anterior descending artery has a tubular 20% stenosis in the     proximal to mid vessel.  The distal LAD has a tubular 80% stenosis.  The     apical LAD has a tubular 80% stenosis.  The LAD gives rise to a  small     diagonal branch.  3. The left circumflex was a large co-dominant vessel.  It gives rise to a     large first obtuse marginal branch.  There is a 90% stenosis in the mid     portion of the first obtuse marginal branch.  There is haziness     associated with this lesion, and it has the appearance of a positive     ruptured plaque.  There is a normal sized second obtuse marginal branch,     which has a 50% stenosis proximally and a 60% stenosis distally.  The     distal circumflex also gives rise to 2 small posterolateral branches.  4. The right coronary artery is a relatively small co-dominant vessel.     There is diffuse 60% stenosis in the proximal vessel.  In the  mid vessel     at the acute margin, there is a 20% stenosis and the distal vessel has a     20% stenosis.  There is a small to normal sized acute marginal branch,     which has a 70% at its origin.  The distal right coronary artery also     gives rise to a small posterior descending coronary artery and a small     posterolateral branch.   IMPRESSION:  1. Normal left ventricular systolic function.  2. Three-vessel coronary artery disease, as described.  The culprit lesion     appears to be the 90% stenosis in the first obtuse marginal branch.     There is also significant disease in the distal LAD and moderate, but     nonobstructive, disease in the small right coronary artery.   PLAN:  Percutaneous intervention of the obtuse marginal and the LAD, see  below.   PTCA PROCEDURAL NOTE:  Following completion of diagnostic catheterization,  we proceeded with percutaneous coronary intervention.  We utilized the  preexisting #6 French sheath in the right femoral artery.  Heparin and  Integrilin were administered per protocol.  We used a #6 [place] LS 3.5  guiding catheter.  A BMW wire was advanced under fluoroscopic guidance into  the distal portion of the first obtuse marginal branch.  We then performed  PTCA of the 90%  stenosis in the obtuse marginal with a 2.5 x 12 mm Quantum  balloon inflated to 10 atmospheres.  We then positioned a 2.5 x 13 mm CYPHER  drug-eluting stent across the area of diseased vessel and deployed the stent  at 15 atmospheres.  We then went back with a 2.5 x 12 mm Quantum balloon  within the stent and inflated this balloon to 22 atmospheres.  The final  angiographic images were obtained revealing patency of the obtuse marginal  branch with 0% residual stenosis and TIMI 3 flow.   We then turned our attention to the LAD.  The BMW wire was advanced under  fluoroscopic guidance into the apical portion of the LAD.  We then performed  PTCA of the 80% stenosis in the distal LAD with a 2.0 x 15 mm Quantum  balloon inflated to 12 atmospheres.  We then positioned a 2.0 x 15 mm Mini  Vision bare metal stent across the area of segment of disease and deployed  the stent at 12 atmospheres.  We then went back with a 2.25 x 12 mm Quantum  balloon and positioning this within the stent, we inflated this balloon to  14 atmospheres in the proximal and distal edges of the stent.  Final  angiographic images are obtained revealing patency of the LAD at the stent  site and with 0% residual stenosis and TIMI 3 flow.   COMPLICATIONS:  None.   RESULTS:  1. Successful PTCA with placement of a drug-eluting stent in the first     obtuse marginal branch.  A 90% stenosis with haziness was reduced to 0%     residual with TIMI 3 flow.  2. Successful PTCA with placement of a bare metal stent in the distal left     anterior descending artery.  A tubular 80% stenosis was reduced to 0%     residual with TIMI flow.   PLAN:  Integrilin will be continued for 18 hours.  It is recommended that  the patient will be treated with Plavix for a minimum of 6-9 months.  She  also needs aggressive risk factor modification.  Regarding the residual  disease in the apical LAD, at this point, would recommend continue  medical therapy.  If she has recurrent chest pain, which is refractor to medical  therapy, percutaneous coronary intervention of the apical LAD could be  considered.                                               [name], M.D. [facility]    MWP/MEDQ  D:  07/24/2003  T:  07/25/2003  Job:  [id number]   cc:   Dr. Donna Christen, [place]   Vida Roller, M.D.  Fax: [phone]   Cardiac Cath Lab

## 2010-09-04 NOTE — Procedures (Signed)
NAME:  KASEN, SAKO NO.:  [id number]   MEDICAL RECORD NO.:  [id number]                   PATIENT TYPE:  INP   LOCATION:                                       FACILITY:   PHYSICIAN:  Vida Roller, M.D.                DATE OF BIRTH:  12-14-1955   DATE OF PROCEDURE:  DATE OF DISCHARGE:  07/23/2003                                  ECHOCARDIOGRAM   TAPE NUMBER:  [id number], tape count [id number] through 1610.   INDICATION:  This is a 55 year old woman with chest discomfort and ST-  segment elevation, on EKG.  No previous studies.   TECHNICAL QUALITY:  Limited.   M-MODE TRACINGS:  1. The aorta is 31 mm.  2. The left atrium is 35 mm.  3. The septum is 14 mm.  4. The posterior wall is 14 mm.  5. Left ventricular diastolic dimension is 35 mm.  6. Left ventricular systolic dimension is 25 mm.   2-D AND DOPPLER IMAGING:  1. The left ventricle is normal size with preserved left ventricular     systolic function, estimated ejection fraction is 60 to 65%.  There are     no obvious wall-motion abnormalities seen.  There is a mild relaxation     abnormality seen on transmitral and pulmonary venous Doppler tracings.  2. The right ventricle is normal size with normal systolic function.  3. Both atria appear normal size.  There is no obvious atrial septal defect.  4. The aortic valve is trileaflet, tri-commissure, with no evidence of     stenosis or regurgitation.  5. The mitral valve is morphologically unremarkable with trace     insufficiency.  No stenosis is seen.  6. The pulmonic valve is not well seen.  7. The tricuspid valve is morphologically unremarkable with no stenosis or     regurgitation.  8. Pericardial structures appeared normal.  9. The inferior vena cava appears small.  10.      The ascending aorta is not well seen.      ___________________________________________                                            Vida Roller, M.D.   JH/MEDQ  D:   07/23/2003  T:  07/24/2003  Job:  [id number]

## 2010-09-04 NOTE — Procedures (Signed)
NAME:  Danielle Cruz, Danielle Cruz                            ACCOUNT NO.:  [id number]   MEDICAL RECORD NO.:  [id number]                   PATIENT TYPE:  INP   LOCATION:  6526                                 FACILITY:  [facility]   PHYSICIAN:  Edward L. Juanetta Gosling, M.D.             DATE OF BIRTH:  1955-09-05   DATE OF PROCEDURE:  DATE OF DISCHARGE:  07/25/2003                                EKG INTERPRETATION   TIME:  July 23, 2003, at 01:09.   FINDINGS:  The rhythm is sinus rhythm with a rate in the 80's.  There is  possible right and left atrial enlargement.  There is marked ST-T wave  changes inferiorly with lateral ST elevation, suggestive of an acute  myocardial infarction.   IMPRESSION:  Abnormal electrocardiogram.      ___________________________________________                                            Oneal Deputy. Juanetta Gosling, M.D.   ELH/MEDQ  D:  07/25/2003  T:  07/26/2003  Job:  [id number]

## 2010-09-04 NOTE — Assessment & Plan Note (Signed)
[facility]                         [place] CARDIOLOGY OFFICE NOTE   TREAZURE, NERY                         MRN:          [id number]  DATE:02/14/2006                            DOB:          [birth year]    Ms. Etherington returns to the office at the request of an orthopedic surgeon who  plans to remove bone spurs from her left ankle.  She has had chronic pain  prompting consultation.  There was some discussion of an ankle replacement  procedure.   Ms. Hayes has done well from a cardiac standpoint.  She experiences no chest  discomfort.  She has dyspnea only with moderate exertion.  She misunderstood  the need for continuing cardiology followup and also has not been taking  atorvastatin as previously prescribed.   She continues to smoke cigarettes, albeit at a reduced rate.  She claims  consumption of one-quarter pack per day.  She has not had recent  vaccinations.  She does not really have a primary care physician at the  present time, although she sees Dr. Phylliss Bob for her rheumatoid arthritis.   CURRENT MEDICATIONS:  1. Prevacid 30 mg daily.  2. Aspirin 81 mg daily.  3. Diltiazem 360 mg daily.  4. Mobic 15 mg daily.  5. Toprol-XL 100 mg daily.  6. Chlorthalidone 12.5 mg daily.   EXAMINATION:  GENERAL:  Pleasant woman in no acute distress.  VITAL SIGNS:  The weight is 150 pounds, 9 pounds less than 2005.  Blood  pressure 125/75, heart rate 75 and regular, respirations 16.  HEENT:  Mild arcus.  NECK:  No jugular venous distention.  Normal carotid upstrokes without  bruits.  ENDOCRINE:  Mild diffuse thyroid enlargement.  LUNGS:  Clear.  CARDIAC:  Normal first and second heart sounds; fourth heart sound present;  normal PMI.  ABDOMEN:  Soft and nontender; no bruits; no organomegaly.  EXTREMITIES:  No edema; normal distal pulses.  SKIN:  No significant lesions.  MUSCULOSKELETAL:  No joint deformities.  NEUROMUSCULAR:  Symmetric strength and  tone; normal cranial nerves.   No recent laboratory is available.   IMPRESSION:  Ms. Berwanger has known coronary disease, having undergone stent  implantation for two-vessel coronary disease in 2005.  She has been  asymptomatic.  She has preserved left ventricular systolic function.  The  surgical procedure planned is no worse than moderate risk and probably is  low risk.  Accordingly, we can proceed without further testing.  She is  taking  reasonable doses of Toprol-XL for risk reduction in the perioperative  interval.  Aspirin may be discontinued if necessary, but ideally should not  be interrupted.  We will resume treatment with atorvastatin 80 mg daily and  follow a lipid profile and chemistry profile in 1 month.  Influenza vaccine  administered.  I will see this nice woman again in 6 months.     Gerrit Friends. Dietrich Pates, MD, [facility]    RMR/MedQ  DD: 02/14/2006  DT: 02/15/2006  Job #: [id number]

## 2010-09-04 NOTE — Procedures (Signed)
NAME:  Danielle Cruz, Danielle Cruz NO.:  [id number]   MEDICAL RECORD NO.:  [id number]           PATIENT TYPE:   LOCATION:                                 FACILITY:   PHYSICIAN:  Vida Roller, M.D.   DATE OF BIRTH:  [birth year]   DATE OF PROCEDURE:  DATE OF DISCHARGE:                                    STRESS TEST   HISTORY:  [name] is a 55 year old female with a history of coronary  artery disease status post PCI to her LAD and circumflex in April of 2005  with residual stenosis in her RCA of 60% and circumflex of 60%.  She has had  no recurrent symptoms.  This test is done for exercise capacity.   Originally, exercise Cardiolite was scheduled.  However, the patient is  having significant pain secondary to rheumatoid arthritis and unable to walk  on the treadmill today.  The test was changed to an adenosine Cardiolite for  the above reason.   EKG at rest revealed sinus rhythm at 72 beats per minute with nonspecific ST  abnormalities.  Blood pressure 128/72.   The patient was infused with 41 mg of adenosine over a 4 minute protocol  with Cardiolite injected at three minutes.  The patient reported chest  discomfort which resolved in recovery.  EKG revealed no ischemic changes and  no arrhythmias.   Final images and results are pending M.D. review.     Amy   AB/MEDQ  D:  01/28/2004  T:  01/28/2004  Job:  [id number]

## 2010-09-04 NOTE — Procedures (Signed)
NAME:  Danielle Cruz, Danielle Cruz                            ACCOUNT NO.:  [id number]   MEDICAL RECORD NO.:  [id number]                   PATIENT TYPE:  INP   LOCATION:  3727                                 FACILITY:  [facility]   PHYSICIAN:  Edward L. Juanetta Gosling, M.D.             DATE OF BIRTH:  06-06-1955   DATE OF PROCEDURE:  DATE OF DISCHARGE:                                EKG INTERPRETATION   PROCEDURE:  Electrocardiogram #1.   TIME:  July 23, 2003, at 1:09.   FINDINGS:  1. The rhythm is sinus rhythm with a rate in the 80's.  2. There is ST elevation laterally suggestive of acute myocardial     infarction.  3. There is ST-T depression inferiorly suggestive of reciprocal changes     versus ischemia.   IMPRESSION:  Abnormal electrocardiogram.   PROCEDURE:  Electrocardiogram #2.   TIME:  July 23, 2003 at 1:52.   FINDINGS:  1. The rhythm is normal sinus rhythm with a rate in the 90's.  2. There is left atrial enlargement.  3. Since the previously tracing, there has been a Q wave seen in limb lead 3     which was not present before, and the previously-noted, marked ST-T wave     changes have resolved.   IMPRESSION:  Abnormal electrocardiogram.      ___________________________________________                                            Oneal Deputy. Juanetta Gosling, M.D.   ELH/MEDQ  D:  07/23/2003  T:  07/24/2003  Job:  [id number]

## 2010-09-04 NOTE — Op Note (Signed)
NAME:  Danielle Cruz, PINCOCK                  ACCOUNT NO.:  [id number]   MEDICAL RECORD NO.:  [id number]          PATIENT TYPE:  AMB   LOCATION:  DSC                          FACILITY:  [facility]   PHYSICIAN:  Leonides Grills, M.D.     DATE OF BIRTH:  1956/01/15   DATE OF PROCEDURE:  05/03/2006  DATE OF DISCHARGE:                               OPERATIVE REPORT   PREOPERATIVE DIAGNOSES:  1. Left anterior ankle impingement  2. Left anterior distal tibial spurs.  3. Left dorsal talar neck spurs.   POSTOPERATIVE DIAGNOSES:  1. Left anterior ankle impingement  2. Left anterior distal tibial spurs.  3. Left dorsal talar neck spurs.   OPERATION:  1. Left ankle arthroscopy, with extensive debridement.  2. Left excision tibial spurs.  3. Left excision talar spurs.   ANESTHESIA:  General with block.   SURGEON:  Leonides Grills, M.D.   ASSISTANT:  Evlyn Kanner, PA-C.   ESTIMATED BLOOD LOSS:  Minimal.   TOURNIQUET TIME:  Approximately an hour.   COMPLICATIONS:  None.   DISPOSITION:  Stable to the PR.   INDICATIONS:  This is a 55 year old female who has had advanced  arthritis in her left ankle with anterior ankle impingement secondary to  osteophyte formation and synovitis.  She was consented for the above  procedure.  All risks, which infection, neurovascular injury, persistent  pain, worsening pain, stiffness, arthritis, with possible fusion versus  ankle replacement in the future, were all explained.  Questions were  encouraged and answered   OPERATIVE NOTE:  The patient was brought to the operating room and  placed in the supine position. After adequate general endotracheal tube  anesthesia was administered with block as well as Ancef 1 g IV  piggyback, the patient was then placed in a sloppy lateral position,  with the operative side up on a beanbag.  All bony prominences were well  padded.  The left lower extremity was then prepped and draped in a  sterile manner, with a proximally  placed thigh tourniquet.  The limb was  gravity exsanguinated.  The tourniquet was elevated to 290 mmHg.  Anatomical landmarks to include anterior tibialis tendon and peroneus  tertius tendon were mapped out. Superficial peroneal nerve could not be  seen.  A spinal needle was then placed in the ankle, and  20 cc of  normal saline was instilled into the ankle.  Weston Brass and spread technique  was then utilized to create the anteromedial portal medial to the  anterior tibialis tendon.  Blunt-tip trocar with cannula followed by  camera was placed into the ankle, and under direct visualization the  anterolateral portal was then created lateral to peroneus tertius  tendon.  The skin was illuminated, but we still could not see the  superficial peroneal nerve.  There was a tremendous amount of synovitis  over the entire anterior aspect of the ankle.  There was advanced  arthritis throughout the ankle as well.  This was extensively debrided  with a radiofrequency bevel and the shaver. This took quite some time.  Once this was removed,  we then extended the incisions and, with a curved  1/4-inch osteotome, removed the distal anterior tibial spurs and then  shaved this down with a 3.5 mm shaver and finished it off with a 2.9 mm  shaver. This was done through both the anterolateral and anteromedial  portals, where the skin incisions were extended. This was also extended  for the dorsal talar neck spurs, where a curved 1/4-inch osteotome  followed by a 3.5 mm bur and shaver were used to remove this spur as  well.  Ankle was ranged, and it was stiff.  There was no impingement  anteriorly, and we also traced this to the medial lateral gutters as  well.  No distinct impingement.  Again, there was advanced arthritis  throughout the ankle.  Pictures were obtained throughout the procedure.  There were no loose bodies left behind.  There were fragments of bone.  The camera was removed.  The wound was closed with  4-0 nylon sutures.  Sterile dressing was applied.  A cam walker boot was applied.  The  patient was stable to the PR.      Leonides Grills, M.D.  Electronically Signed     PB/MEDQ  D:  05/03/2006  T:  05/04/2006  Job:  [id number]

## 2010-09-04 NOTE — Discharge Summary (Signed)
NAME:  Danielle Cruz, Danielle Cruz NO.:  [id number]   MEDICAL RECORD NO.:  [id number]                   PATIENT TYPE:  INP   LOCATION:  6526                                 FACILITY:  [facility]   PHYSICIAN:  Vida Roller, M.D.                DATE OF BIRTH:  1956-01-09   DATE OF ADMISSION:  07/23/2003  DATE OF DISCHARGE:  07/25/2003                           DISCHARGE SUMMARY - REFERRING   DISCHARGE DIAGNOSES:  1. Chest pain, stable.  2. Coronary artery disease status post PCI to the left anterior descending     and obtuse marginal 1.  3. Hypertension, treated.  4. Hyperlipidemia.  5. Nutritional consultation.  6. History of tobacco abuse, smoking cessation counseling.  7. Rheumatoid arthritis.  8. History of partial hysterectomy.   HOSPITAL COURSE:  Danielle Cruz is a 55 year old female who presented to the  emergency room at [facility] complaining of substernal chest pain.  EMS was  summoned.  She was given four baby aspirin and a sublingual nitroglycerine  and the pain relieved.  EKG revealed significant S-T segment depression in  the inferior leads.  The patient was started on IV nitroglycerin with no  reoccurrence of chest pain since admission.  Her EKG normalized after 45  minutes of treatment.  She was then transported to [facility] for a  catheter on 07/24/03.  She was found to have:  LAD with a 20% lesion in the  mid to proximal vessel with an 80% mid distal lesion.  This underwent Multi  Link Mini Vision stent placement, reducing the 80% lesion to a 0% post  procedure.  There was a very distal 80% lesion noted as well.  The  circumflex had a mid 50% followed by 60% vessel.  The OM 1 had a 90% lesion  that underwent PCI utilizing a Cypher stent reducing the 90% lesion to 0%  post procedure.  The right coronary artery had a proximal 60% followed by a  nonobstructive 20% stenoses.  The patient remained in the hospital overnight  and upon discharge home BUN  3, creatinine 0.7, hemoglobin 10.2, hematocrit  29.2.  Cardiac isoenzymes were negative.  Potassium was 3.4 and this was  replaced.   Other lab studies during this patient's hospital stay include a total  cholesterol of 189, triglycerides of 121, HDL of 49, LDL 116.   DISCHARGE MEDICATIONS:  The patient was discharged to home on the following  medications:  1. Plavix 75 mg today for six to nine months.  2. Coated aspirin 325 mg a day.  3. Lopressor 50 mg one tablet b.i.d.   The patient is on prednisone at home and is to resume this as she normally  does for her rheumatoid.  May utilize Tylenol one to two tablets every six  hours as needed for pain.  No stress activity or driving for two days and to  gradually  increase activity.  Remain on a low fat diet.  Clean over the  catheter site with soap and water, no scrubbing.  Call for questions or  concerns at [phone].  She has a follow up appointment with Dr. Anthonette Legato on  08/08/03 at 2:30 p.m.  She is instructed to go by the office today to fill  out forms for Plavix for help with this drug and its affordability.  I have  asked the case worker at the hospital to also help with supplying enough for  her to have until this is carried out.      Guy Franco, P.A. [facility]                      Vida Roller, M.D.    LB/MEDQ  D:  07/25/2003  T:  07/25/2003  Job:  [id number]

## 2010-09-04 NOTE — Discharge Summary (Signed)
NAME:  Danielle Cruz, Danielle Cruz                              ACCOUNT NO.:  [id number]   MEDICAL RECORD NO.:  [id number]                  PATIENT TYPE:   LOCATION:                                       FACILITY:   PHYSICIAN:  Hanley Hays. [place], M.D.           DATE OF BIRTH:   DATE OF ADMISSION:  07/23/2003  DATE OF DISCHARGE:  07/23/2003                                 DISCHARGE SUMMARY   DIAGNOSES:  Chest pain with abnormal EKG.   DISPOSITION:  The patient transferred to [facility] for cardiac  catheterization plus-or-minus intervention.   Briefly this patient is a 55 year old African-American female who presented  to the emergency room complaining of chest pain and shortness of breath with  an abnormal EKG in the inferolateral leads.  The pain resolved with  nitroglycerin. She was seen in consultation by cardiology who felt that  further intervention was warranted.  The patient was transferred without  event and in stable condition to the [facility] to [facility]  Cardiology services.   The reader is referred to the electronic medical record for details  regarding this patient's health history, etc.     ___________________________________________                                         Hanley Hays. Josefine Class, M.D.   FED/MEDQ  D:  07/25/2003  T:  07/26/2003  Job:  [id number]

## 2010-09-04 NOTE — Procedures (Signed)
NAME:  Danielle Cruz, Danielle Cruz                            ACCOUNT NO.:  [id number]   MEDICAL RECORD NO.:  [id number]                   PATIENT TYPE:  INP   LOCATION:  6526                                 FACILITY:  [facility]   PHYSICIAN:  Edward L. Juanetta Gosling, M.D.             DATE OF BIRTH:  Feb 02, 1956   DATE OF PROCEDURE:  DATE OF DISCHARGE:  07/25/2003                                EKG INTERPRETATION   TIME:  July 23, 2003, at 1:52.   FINDINGS:  1. The rhythm is sinus rhythm with a rate in the 90's.  2. There is left atrial enlargement.  3. There are Q waves inferiorly which were not seen on the previous tracing.  4. The previously-noted ST-elevation laterally has resolved.   IMPRESSION:  Abnormal electrocardiogram.      ___________________________________________                                            Oneal Deputy. Juanetta Gosling, M.D.   ELH/MEDQ  D:  07/25/2003  T:  07/26/2003  Job:  [id number]

## 2010-09-04 NOTE — H&P (Signed)
NAME:  Danielle Cruz, Danielle Cruz                            ACCOUNT NO.:  [id number]   MEDICAL RECORD NO.:  [id number]                   PATIENT TYPE:  INP   LOCATION:  IC03                                 FACILITY:  [facility]   PHYSICIAN:  Hanley Hays. Dechurch, M.D.           DATE OF BIRTH:  1956-02-03   DATE OF ADMISSION:  07/23/2003  DATE OF DISCHARGE:  07/23/2003                                HISTORY & PHYSICAL   A 55 year old African-American female followed by Dr. Katrinka Blazing with a past  medical history since January, 2005 of rheumatoid arthritis and  hypertension, who presented to the emergency room after being awakened by  chest pain which did not resolve.  She describes it as dull and constant,  substernal, without radiation.  She had some shortness of breath, which  prompted her to proceed to the ER.  She also noted some mild diaphoresis.  The patient received baby aspirin and sublingual nitroglycerin via EMS.  When presenting to the emergency room revealed ST elevation laterally with  ST depression in the inferior leads.  She was placed on a nitroglycerin  drip, and her pain, for the most part, resolved.  Patient was admitted to  the intensive care unit for further evaluation and treatment.   MEDICATIONS ON ADMISSION:  Prednisone 1 daily, she thinks 10 mg.   ALLERGIES:  None known.   FAMILY HISTORY:  Pertinent for mother dying in her 30s during a delivery of  a set of twins.  She has a twin sister who is alive and well.  Brother with  an MI at age 56.  Father died of cancer in his 33s.   PAST MEDICAL HISTORY:  1. Rheumatoid arthritis, predominantly involving her hands and feet.  She is     actually disabled due to this.  She has not been on any __________     agents, only nonsteroidals in the past.  2.  History of a hysterectomy.  2. Hypertension.  She is actually on a blood pressure pill, but she is not     sure what it is.   SOCIAL HISTORY:  She is married.  Smokes about a pack per day.   Denies any  alcohol abuse.  She has three children who are alive and well.  She  previously worked in Set [employer] but is now disabled due to her rheumatoid  arthritis.   PHYSICAL EXAMINATION:  VITAL SIGNS:  Pulse 70, blood pressure 120/70,  respirations unlabored, saturations are 100% on 2 liters.  No fever.  GENERAL:  A well-developed and well-nourished female who is alert and  appropriate.  HEENT:  No significant findings.  NECK:  Supple.  No JVD, bruits, thyromegaly, or adenopathy.  LUNGS:  Clear to auscultation.  HEART:  Regular.  No murmur.  No gallop.  ABDOMEN:  Soft.  Nontender.  No masses noted.  GU/BREASTS:  Deferred.  EXTREMITIES:  Without clubbing, cyanosis or  edema.  She has good dorsalis  pedis pulses.  She has tenderness throughout the right second metatarsal  head but no evidence of acute synovitis in any of her joints.   ASSESSMENT/PLAN:  1. Chest pain with normal CK but worrisome electrocardiogram.  A cardiology     consultation has been obtained.  The patient has been placed on beta     blocker and Lovenox.  She has no pain at this point.  Certainly, an echo     is indicated, and given her history, would proceed with catheterization;     however, this decision will be deferred to the cardiology consultant.     Check lipid panels.  She says she has had blood work drawn but does not     know what her cholesterol may be.  2. Rheumatoid arthritis:  Treat with nonsteroidals p.r.n.  If not acutely     active, she would benefit from prednisone.  To continue her prednisone.  3. Hypertension:  Will monitor.  Currently stable.  4. Tobacco abuse:  Patient was counseled on smoking cessation.  This can be     further addressed during this hospital stay.     ___________________________________________                                         Hanley Hays. Josefine Class, M.D.   FED/MEDQ  D:  07/25/2003  T:  07/25/2003  Job:  [id number]

## 2010-09-10 ENCOUNTER — Telehealth: Payer: Self-pay

## 2010-09-10 ENCOUNTER — Other Ambulatory Visit: Payer: Self-pay | Admitting: Family Medicine

## 2010-09-10 ENCOUNTER — Ambulatory Visit (INDEPENDENT_AMBULATORY_CARE_PROVIDER_SITE_OTHER): Payer: Medicare Other | Admitting: Family Medicine

## 2010-09-10 ENCOUNTER — Telehealth: Payer: Self-pay | Admitting: Family Medicine

## 2010-09-10 ENCOUNTER — Ambulatory Visit (HOSPITAL_COMMUNITY)
Admission: RE | Admit: 2010-09-10 | Discharge: 2010-09-10 | Disposition: A | Discharge status: Home / Self Care | Payer: Medicare Other | Source: Ambulatory Visit | Attending: Family Medicine | Admitting: Family Medicine

## 2010-09-10 VITALS — BP 120/70 | HR 71 | Resp 16 | Wt 151.0 lb

## 2010-09-10 DIAGNOSIS — I2510 Atherosclerotic heart disease of native coronary artery without angina pectoris: Secondary | ICD-10-CM

## 2010-09-10 DIAGNOSIS — M7989 Other specified soft tissue disorders: Secondary | ICD-10-CM

## 2010-09-10 DIAGNOSIS — I10 Essential (primary) hypertension: Secondary | ICD-10-CM

## 2010-09-10 DIAGNOSIS — I69959 Hemiplegia and hemiparesis following unspecified cerebrovascular disease affecting unspecified side: Secondary | ICD-10-CM

## 2010-09-10 DIAGNOSIS — I6350 Cerebral infarction due to unspecified occlusion or stenosis of unspecified cerebral artery: Secondary | ICD-10-CM

## 2010-09-10 DIAGNOSIS — E785 Hyperlipidemia, unspecified: Secondary | ICD-10-CM

## 2010-09-10 DIAGNOSIS — I825Y9 Chronic embolism and thrombosis of unspecified deep veins of unspecified proximal lower extremity: Secondary | ICD-10-CM | POA: Insufficient documentation

## 2010-09-10 DIAGNOSIS — F17200 Nicotine dependence, unspecified, uncomplicated: Secondary | ICD-10-CM

## 2010-09-10 DIAGNOSIS — Z720 Tobacco use: Secondary | ICD-10-CM

## 2010-09-10 DIAGNOSIS — I639 Cerebral infarction, unspecified: Secondary | ICD-10-CM

## 2010-09-10 NOTE — Patient Instructions (Addendum)
F/U in 3 months.  You absolutely need to have a sitter  And cannot be left alone at any time , because of safety issues.   Ultrasound on right leg today.  Your spouse needs to speak with social services  About the situation   Please think about quitting smoking.  This is very important for your health.  Consider setting a quit date, then cutting back or switching brands to prepare to stop.  Also think of the money you will save every day by not smoking.  Quick Tips to Quit Smoking: Fix a date i.e. keep a date in mind from when you would not touch a tobacco product to smoke  Keep yourself busy and block your mind with work loads or reading books or watching movies in malls where smoking is not allowed  Vanish off the things which reminds you about smoking for example match box, or your favorite lighter, or the pipe you used for smoking, or your favorite jeans and shirt with which you used to enjoy smoking, or the club where you used to do smoking  Try to avoid certain people places and incidences where and with whom smoking is a common factor to add on  Praise yourself with some token gifts from the money you saved by stopping smoking  Anti Smoking teams are there to help you. Join their programs  Anti-smoking Gums are there in many medical shops. Try them to quit smoking   Side-effects of Smoking: Disease caused by smoking cigarettes are emphysema, bronchitis, heart failures  Premature death  Cancer is the major side effect of smoking  Heart attacks and strokes are the quick effects of smoking causing sudden death  Some smokers lives end up with limbs amputated  Breathing problem or fast breathing is another side effect of smoking  Due to more intakes of smokes, carbon mono-oxide goes into your brain and other muscles of the body which leads to swelling of the veins and blockage to the air passage to lungs  Carbon monoxide blocks blood vessels which leads to blockage in the flow of blood  to different major body organs like heart lungs and thus leads to attacks and deaths  During pregnancy smoking is very harmful and leads to premature birth of the infant, spontaneous abortions, low weight of the infant during birth  Fat depositions to narrow and blocked blood vessels causing heart attacks  In many cases cigarette smoking caused infertility in men    Fasting lipid, hepatic and chem 7 mid Danielle Cruz

## 2010-09-10 NOTE — Telephone Encounter (Signed)
Danielle Cruz has plans to speak directly with social services himself, I am aware of all this, the note etc was presented at the time of the visit

## 2010-09-10 NOTE — Telephone Encounter (Signed)
Patient sister states her right arm is out of socket at shoulder, also right leg is swelling, rash on left arm, patient is smoking, please check toes, concerned with short term memory, states patient is not getting baths like she should, sister just wanted you to be aware.

## 2010-09-10 NOTE — Telephone Encounter (Signed)
Discussed  Concerns in the office voiced, however spouse is adamant that he be the only person we discuss pt's health/life with, without both himself and pt being present

## 2010-09-10 NOTE — Telephone Encounter (Signed)
Toy Baker came by the office and was wanting to know what you decided as far as the adult home placement and I told her I faxed her the fl2 and a letter but she hadn't been to her office today. I advised her of the note and she said that CAP would only come out at night if he had a 3rd shift job but would not come just for him to go to his girlfriends house or out wherever all night. She left her number. She states he is leaving her at home everynight and the police found her alone and they filed a report. [phone].

## 2010-09-14 ENCOUNTER — Other Ambulatory Visit: Payer: Self-pay | Admitting: Cardiology

## 2010-09-15 ENCOUNTER — Encounter: Payer: Self-pay | Admitting: Family Medicine

## 2010-09-15 ENCOUNTER — Telehealth: Payer: Self-pay | Admitting: Family Medicine

## 2010-09-15 NOTE — Assessment & Plan Note (Signed)
Stable at this time 

## 2010-09-15 NOTE — Assessment & Plan Note (Signed)
New, though homan's is negative , high risk for DVT, immobility ands nicotine , will check venous doppler

## 2010-09-15 NOTE — Assessment & Plan Note (Signed)
counselled to quit, spouse reports he will stop letting pt smoke

## 2010-09-15 NOTE — Assessment & Plan Note (Signed)
Unchanged, safety now a major concern, with th involvement of law enforcement and social services. The gravity of the situation is clearly explained to spouse , with the need to make changes. States he will sp with social services and obtain CAP help for the pt , does not want family to be contacted  Or responded to if they call office about pt

## 2010-09-15 NOTE — Assessment & Plan Note (Signed)
Controlled, no change in medication  

## 2010-09-15 NOTE — Progress Notes (Signed)
Subjective:    Patient ID: Danielle Cruz, female    DOB: 10-Apr-1956, 55 y.o.   MRN: [id number]  HPI Pt is in for routine follow up. Her spouse accompanies her , per routine. He reports that she has been stable, no new concerns or problems. Reports improvement with rash on left forearm with use of medication. Reports appetite is good and bowel movements are regular. Denies skin breakdown, and states he bathes her daily. Does concur that she smokes, I advised him to stop providing her with cigaretes as she already has vascular disease and this inc risk of repeat stroke or heart disease. He agrees Pt's spouse initially denies  A discussion with social services, howvever when specifically approached it the fact that alternate housing for his wife for safety issues have been identified, he opens up and the story is reversed. Essentially , there have been documented instances including police going to the hose one night finding pt alone at night. This is an unsatisfactory risk as she is limited in her mobility, and unable to adequately care for herself, she requires 24 hour supervsiion. The spouse reportedly sleeps out of the home most of the time, and this is a concern by her family, and understandably so. Mention is also made by a tele call which came in from her twin on the day of the visit, taken by nursing that her right shoulder is out of socket, and her hygiene is poor, right lg swollen and that she has a rash. Each of these are reviewed at OV . Her spouse is upset with family, stating he gets no help from them I made it clear that her safety is at stake and this is unacceptable, if he continues to leave her unattended for any time. States he will now agree to CAP coming in. I let him know this would be unlikely to be at night, states he will ensure she is attended to, and that as long as she does not want to, he will not put her in a home.Went on to request no discussion Precious Haws regarding his wife be  accepted by the office  From family members, and he is considering preventing them from setting foot on his property. I advised he would absolutely need to meet with social services on the day of the visit, to discuss safe housing of his wife at home as this is a big prblem, he states he will. Of note, the interaction between pt and spouse seems to be good , and when I tried to asses Danielle Cruz's willingness to leave her home, she appears o want to stay where she currently is, however her ability to express herself in any manner that I can fully understand has been lost   Review of Systems History from spouse.Negative for recent fever , chills, nasal congestion with drainage or cough. Negative for diareah, constipation or vomiting. Negative for malodorous urine. Negative for skin breakdown Right upper and lower extremity weakness with inabillty to safely ambulate or perform ADL's unchanged     Objective:   Physical Exam Patient alert and  in no Cardiopulmonary distress.Expressiveaphasia.   HEENT: No facial asymmetry, EOMI, no sinus tenderness, , Oropharynx  moist.  Neck supple no adenopathy.  Chest: Clear to auscultation bilaterally.  CVS: S1, S2 no murmurs, no S3.  ABD: Soft non tender. .  Ext: Right lower extremity edema  [id number]  ROM spine,and right shoulder,  Skin: Intact, no ulcerations noted.Hyperpigmented macular rash on left forearm improved, no  scaling, erythema or drainage noted  Psych: Good eye contact,.  not anxious or depressed appearing.  CNS: CN 2-12 intact, power, tone and sensation  Reduced in right upper and lower extremities       Assessment & Plan:

## 2010-09-15 NOTE — Assessment & Plan Note (Signed)
Uncontrolled with high LDL at last check, needs to be repeated to see if dose adjustment is needed

## 2010-09-15 NOTE — Telephone Encounter (Signed)
Spoke with Child psychotherapist, she will work with the spouse

## 2010-12-08 ENCOUNTER — Other Ambulatory Visit: Payer: Self-pay | Admitting: Family Medicine

## 2011-01-07 ENCOUNTER — Encounter: Payer: Self-pay | Admitting: Cardiology

## 2011-01-08 ENCOUNTER — Encounter: Payer: Self-pay | Admitting: Cardiology

## 2011-01-08 ENCOUNTER — Ambulatory Visit (INDEPENDENT_AMBULATORY_CARE_PROVIDER_SITE_OTHER): Payer: Medicare Other | Admitting: Cardiology

## 2011-01-08 DIAGNOSIS — M069 Rheumatoid arthritis, unspecified: Secondary | ICD-10-CM | POA: Insufficient documentation

## 2011-01-08 DIAGNOSIS — K635 Polyp of colon: Secondary | ICD-10-CM | POA: Insufficient documentation

## 2011-01-08 DIAGNOSIS — E785 Hyperlipidemia, unspecified: Secondary | ICD-10-CM

## 2011-01-08 DIAGNOSIS — Z720 Tobacco use: Secondary | ICD-10-CM | POA: Insufficient documentation

## 2011-01-08 DIAGNOSIS — I10 Essential (primary) hypertension: Secondary | ICD-10-CM

## 2011-01-08 DIAGNOSIS — I69959 Hemiplegia and hemiparesis following unspecified cerebrovascular disease affecting unspecified side: Secondary | ICD-10-CM

## 2011-01-08 DIAGNOSIS — F17201 Nicotine dependence, unspecified, in remission: Secondary | ICD-10-CM

## 2011-01-08 DIAGNOSIS — Z87891 Personal history of nicotine dependence: Secondary | ICD-10-CM

## 2011-01-08 DIAGNOSIS — R7401 Elevation of levels of liver transaminase levels: Secondary | ICD-10-CM | POA: Insufficient documentation

## 2011-01-08 DIAGNOSIS — D649 Anemia, unspecified: Secondary | ICD-10-CM

## 2011-01-08 DIAGNOSIS — I2510 Atherosclerotic heart disease of native coronary artery without angina pectoris: Secondary | ICD-10-CM

## 2011-01-08 MED ORDER — ATORVASTATIN CALCIUM 80 MG PO TABS: 80.0000 mg | ORAL_TABLET | Freq: Every day | ORAL | Status: DC

## 2011-01-08 NOTE — Assessment & Plan Note (Signed)
We will reassess with her next blood draw.

## 2011-01-08 NOTE — Assessment & Plan Note (Signed)
Minimal normocytic anemia some months ago.  CBC will be rechecked with her next blood draw.

## 2011-01-08 NOTE — Assessment & Plan Note (Signed)
No deterioration in functional status.  She continues to walk short distances with assistance and to do some housework.

## 2011-01-08 NOTE — Assessment & Plan Note (Signed)
Patient reports that she has not relapsed and that she continues to refrain from cigarette smoking with the help of transdermal nicotine.

## 2011-01-08 NOTE — Assessment & Plan Note (Signed)
Lipid profile is somewhat suboptimal.  Atorvastatin 80 mg daily will be substituted for pravastatin with reassessment of lipid profile in one month.

## 2011-01-08 NOTE — Progress Notes (Signed)
HPI : Danielle Cruz returns to the office as scheduled for continued assessment and treatment of coronary disease and cardiovascular risk factors.  Since her last visit, she has done quite well.  She has not been admitted to hospital nor required urgent medical intervention.  At her most recent visit to her rheumatologist, pedal edema was noted and was treated with an injection of an unknown medication with resolution.  She denies dyspnea, orthopnea, PND, palpitations, lightheadedness or syncope.  Current Outpatient Prescriptions on File Prior to Visit  Medication Sig Dispense Refill  . amLODipine (NORVASC) 10 MG tablet Take 10 mg by mouth daily. Take one tablet by mouth once a day       . aspirin (BAYER ASPIRIN) 325 MG tablet Take 325 mg by mouth daily. Take one by mouth once daily       . Cholecalciferol (VITAMIN D) 400 UNITS capsule Take 400 Units by mouth daily. Take 1 tablet by mouth daily       . FERROUS GLUCONATE IRON PO Take by mouth.        . leflunomide (ARAVA) 20 MG tablet Take 20 mg by mouth daily.        . metoprolol succinate (TOPROL-XL) 25 MG 24 hr tablet Take 1 tablet (25 mg total) by mouth daily.  30 tablet  5  . oxybutynin (DITROPAN) 5 MG tablet TAKE  1/2 TABLET BY MOUTH TWICE DAILY  30 tablet  3  . TraMADol HCl 50 MG TBSO Take by mouth 4 (four) times daily. Take one tablet by mouth four times a day       . atorvastatin (LIPITOR) 80 MG tablet Take 1 tablet (80 mg total) by mouth daily. To replace pravastatin  30 tablet  11     No Known Allergies    Past medical history, social history, and family history reviewed and updated.  ROS: See history of present illness.  PHYSICAL EXAM: BP 132/85  Pulse 79  Ht 5\' 7"  (1.702 m)  Wt 69.4 kg (153 lb)  BMI 23.96 kg/m2  General-Well developed; no acute distress Body habitus-proportionate weight and height Neck-No JVD; no carotid bruits Lungs-clear lung fields except for a few left basilar rales; resonant to  percussion Cardiovascular-normal PMI; normal S1 and S2; S4 present Abdomen-normal bowel sounds; soft and non-tender without masses or organomegaly Musculoskeletal-ulnar deviation of the fingers, no cyanosis or clubbing Neurologic-Normal cranial nerves; right-sided weakness, more prominent in the upper extremity Skin-Warm, no significant lesions Extremities-decreased distal pulses-trace by Doppler on left, relatively good right posterior tibial with markedly decreased dorsalis pedis only detectable by Doppler; no edema;  ASSESSMENT AND PLAN:

## 2011-01-08 NOTE — Patient Instructions (Signed)
**Note De-Identified  Obfuscation** Your physician recommends that you schedule a follow-up appointment in: 1 year Your physician recommends that you return for lab work in: 2 months Your physician has recommended you make the following change in your medication: STOP Pravastatin and START Atorvastatin 80 mg daily

## 2011-01-08 NOTE — Assessment & Plan Note (Signed)
No symptoms nor problems attributable to coronary disease for the past 7 years.  Our major goal will continue to be optimal control of cardiovascular risk factors.

## 2011-01-11 ENCOUNTER — Encounter: Payer: Self-pay | Admitting: Family Medicine

## 2011-01-11 ENCOUNTER — Ambulatory Visit (INDEPENDENT_AMBULATORY_CARE_PROVIDER_SITE_OTHER): Payer: Medicare Other | Admitting: Family Medicine

## 2011-01-11 ENCOUNTER — Telehealth: Payer: Self-pay | Admitting: *Deleted

## 2011-01-11 VITALS — BP 126/64 | HR 76 | Resp 16 | Ht 67.0 in | Wt 153.1 lb

## 2011-01-11 DIAGNOSIS — L309 Dermatitis, unspecified: Secondary | ICD-10-CM | POA: Insufficient documentation

## 2011-01-11 DIAGNOSIS — Z23 Encounter for immunization: Secondary | ICD-10-CM

## 2011-01-11 DIAGNOSIS — L259 Unspecified contact dermatitis, unspecified cause: Secondary | ICD-10-CM

## 2011-01-11 MED ORDER — INFLUENZA VAC TYPES A & B PF IM SUSP: 0.5000 mL | Freq: Once | INTRAMUSCULAR | Status: DC

## 2011-01-11 MED ORDER — PREDNISONE 5 MG PO KIT: 5.0000 mg | PACK | ORAL | Status: DC

## 2011-01-11 NOTE — Telephone Encounter (Signed)
Written, pls fax 

## 2011-01-11 NOTE — Progress Notes (Signed)
  Subjective:    Patient ID: Danielle Cruz, female    DOB: January 22, 1956, 55 y.o.   MRN: [id number]  HPI 2 day h/o pruritic rash on left buttock extending around to anterior abdomen, noted to be scratching excessively with dry skin.No fever. No drainage form the rash.Spouse states he recently changed her bath soap, and that she ressits application of lotion to her skin often. I advised that he resume old soap and that she absolutely needs to have improved skin care . Reports she was recently seen both by cardiology and rheumatology, had recent dose in in lipid med, and got injection for joint pain, not in the area of the rash   Review of Systems See HPI Denies recent fever or chills. Denies sinus pressure, nasal congestion, ear pain or sore throat. Denies chest congestion, productive cough or wheezing. Denies chest pains, palpitations and leg swelling Denies abdominal pain, nausea, vomiting,diarrhea or constipation.   Denies dysuria, frequency, hesitancy or incontinence. Limitation in mobility from CVA Denies headaches, seizures, numbness, or tingling. Denies depression, anxiety or insomnia.       Objective:   Physical Exam Patient alert and in no cardiopulmonary distress.  HEENT: No facial asymmetry, EOMI, no sinus tenderness,  oropharynx pink and moist.  Neck supple no adenopathy.  Chest: Clear to auscultation bilaterally.  CVS: S1, S2 no murmurs, no S3.  ABD: Soft non tender.   Ext: No edema  MS: Adequate ROM spine, shoulders, hips and knees.  Skin: Intact, maculo papular  rash noted on left buttock, not erythematous , no vesicles  Psych: Good eye contact,  not anxious or depressed appearing.  CNS: CN 2-12 intact,       Assessment & Plan:

## 2011-01-11 NOTE — Patient Instructions (Addendum)
F/U in January.  Flu and TdaP today.  Prednisone dose pack sent for rash   Pls use lotion with baby oil twice daily to entire skin  No med changes  Mammogram past due please schedule at checkout

## 2011-01-11 NOTE — Assessment & Plan Note (Signed)
Pruritic rash on dry skin, no evidence of bacterial infection or zoster, steroid taper and improved skin care to reduce dryness. Also pt to resume using previous soap

## 2011-01-12 NOTE — Telephone Encounter (Signed)
Already faxed.

## 2011-02-01 ENCOUNTER — Telehealth: Payer: Self-pay | Admitting: Family Medicine

## 2011-02-02 NOTE — Telephone Encounter (Signed)
pls advise no cough medicine is covered by insurance, I advise OTC robitussin DM as directed on the bottle, It is efective and safe.

## 2011-02-04 ENCOUNTER — Other Ambulatory Visit: Payer: Self-pay | Admitting: Cardiology

## 2011-02-04 NOTE — Telephone Encounter (Signed)
Patient aware.

## 2011-04-27 ENCOUNTER — Other Ambulatory Visit: Payer: Self-pay | Admitting: Family Medicine

## 2011-05-03 DIAGNOSIS — M069 Rheumatoid arthritis, unspecified: Secondary | ICD-10-CM | POA: Diagnosis not present

## 2011-05-03 DIAGNOSIS — Z79899 Other long term (current) drug therapy: Secondary | ICD-10-CM | POA: Diagnosis not present

## 2011-06-15 ENCOUNTER — Other Ambulatory Visit: Payer: Self-pay | Admitting: Family Medicine

## 2011-06-15 ENCOUNTER — Ambulatory Visit (INDEPENDENT_AMBULATORY_CARE_PROVIDER_SITE_OTHER): Payer: Medicare Other | Admitting: Family Medicine

## 2011-06-15 ENCOUNTER — Encounter: Payer: Self-pay | Admitting: Family Medicine

## 2011-06-15 VITALS — BP 120/70 | HR 88 | Resp 16

## 2011-06-15 DIAGNOSIS — R05 Cough: Secondary | ICD-10-CM | POA: Insufficient documentation

## 2011-06-15 DIAGNOSIS — R5383 Other fatigue: Secondary | ICD-10-CM | POA: Diagnosis not present

## 2011-06-15 DIAGNOSIS — Z139 Encounter for screening, unspecified: Secondary | ICD-10-CM

## 2011-06-15 DIAGNOSIS — R059 Cough, unspecified: Secondary | ICD-10-CM

## 2011-06-15 DIAGNOSIS — I10 Essential (primary) hypertension: Secondary | ICD-10-CM | POA: Diagnosis not present

## 2011-06-15 DIAGNOSIS — M899 Disorder of bone, unspecified: Secondary | ICD-10-CM

## 2011-06-15 DIAGNOSIS — R5381 Other malaise: Secondary | ICD-10-CM | POA: Diagnosis not present

## 2011-06-15 DIAGNOSIS — E785 Hyperlipidemia, unspecified: Secondary | ICD-10-CM

## 2011-06-15 MED ORDER — BENZONATATE 100 MG PO CAPS: 100.0000 mg | ORAL_CAPSULE | Freq: Four times a day (QID) | ORAL | Status: DC | PRN

## 2011-06-15 NOTE — Patient Instructions (Addendum)
CPE in 4 month  You are referred for a mammogram.  I am happy that you are doing better.  Medication is sent in for cough.  CBC, chem 7, lipid , tsh and vit D today, and hepatic

## 2011-06-16 LAB — BASIC METABOLIC PANEL
BUN: 7 mg/dL (ref 6–23)
CO2: 26 mEq/L (ref 19–32)
Calcium: 9.1 mg/dL (ref 8.4–10.5)
Chloride: 107 mEq/L (ref 96–112)
Creat: 0.51 mg/dL (ref 0.50–1.10)
Glucose, Bld: 87 mg/dL (ref 70–99)
Potassium: 4 mEq/L (ref 3.5–5.3)
Sodium: 144 mEq/L (ref 135–145)

## 2011-06-16 LAB — CBC WITH DIFFERENTIAL/PLATELET
Basophils Absolute: 0 10*3/uL (ref 0.0–0.1)
Basophils Relative: 0 % (ref 0–1)
Eosinophils Absolute: 0.1 10*3/uL (ref 0.0–0.7)
Eosinophils Relative: 1 % (ref 0–5)
HCT: 36.9 % (ref 36.0–46.0)
Hemoglobin: 12.2 g/dL (ref 12.0–15.0)
Lymphocytes Relative: 28 % (ref 12–46)
Lymphs Abs: 2.4 10*3/uL (ref 0.7–4.0)
MCH: 29.9 pg (ref 26.0–34.0)
MCHC: 33.1 g/dL (ref 30.0–36.0)
MCV: 90.4 fL (ref 78.0–100.0)
Monocytes Absolute: 1 10*3/uL (ref 0.1–1.0)
Monocytes Relative: 13 % — ABNORMAL HIGH (ref 3–12)
Neutro Abs: 4.8 10*3/uL (ref 1.7–7.7)
Neutrophils Relative %: 58 % (ref 43–77)
Platelets: 285 10*3/uL (ref 150–400)
RBC: 4.08 MIL/uL (ref 3.87–5.11)
RDW: 15.6 % — ABNORMAL HIGH (ref 11.5–15.5)
WBC: 8.3 10*3/uL (ref 4.0–10.5)

## 2011-06-16 LAB — LIPID PANEL
Cholesterol: 140 mg/dL (ref 0–200)
HDL: 30 mg/dL — ABNORMAL LOW (ref >39)
LDL Cholesterol: 87 mg/dL (ref 0–99)
Total CHOL/HDL Ratio: 4.7 Ratio
Triglycerides: 114 mg/dL (ref <150)
VLDL: 23 mg/dL (ref 0–40)

## 2011-06-16 LAB — HEPATIC FUNCTION PANEL
ALT: 31 U/L (ref 0–35)
AST: 34 U/L (ref 0–37)
Albumin: 3.6 g/dL (ref 3.5–5.2)
Alkaline Phosphatase: 105 U/L (ref 39–117)
Bilirubin, Direct: 0.1 mg/dL (ref 0.0–0.3)
Indirect Bilirubin: 0.2 mg/dL (ref 0.0–0.9)
Total Bilirubin: 0.3 mg/dL (ref 0.3–1.2)
Total Protein: 7 g/dL (ref 6.0–8.3)

## 2011-06-16 LAB — VITAMIN D 25 HYDROXY (VIT D DEFICIENCY, FRACTURES): Vit D, 25-Hydroxy: 57 ng/mL (ref 30–89)

## 2011-06-16 LAB — TSH: TSH: 2.454 u[IU]/mL (ref 0.350–4.500)

## 2011-06-18 LAB — HEPATIC FUNCTION PANEL
ALT: 29 U/L (ref 0–35)
AST: 37 U/L (ref 0–37)
Albumin: 3.6 g/dL (ref 3.5–5.2)
Alkaline Phosphatase: 104 U/L (ref 39–117)
Bilirubin, Direct: 0.1 mg/dL (ref 0.0–0.3)
Indirect Bilirubin: 0.2 mg/dL (ref 0.0–0.9)
Total Bilirubin: 0.3 mg/dL (ref 0.3–1.2)
Total Protein: 7 g/dL (ref 6.0–8.3)

## 2011-06-22 ENCOUNTER — Ambulatory Visit (HOSPITAL_COMMUNITY): Payer: Medicare Other

## 2011-06-27 NOTE — Assessment & Plan Note (Signed)
Low HDL otherwise good profile no med change

## 2011-06-27 NOTE — Assessment & Plan Note (Signed)
Controlled, no change in medication  

## 2011-06-27 NOTE — Progress Notes (Signed)
  Subjective:    Patient ID: Danielle Cruz, female    DOB: [birth year], 56 y.o.   MRN: [id number]  HPI The PT is here for follow up and re-evaluation of chronic medical conditions, medication management and review of any available recent lab and radiology data.  Preventive health is updated, specifically  Cancer screening and Immunization.   Questions or concerns regarding consultations or procedures which the PT has had in the interim are  addressed. The PT denies any adverse reactions to current medications since the last visit.  1 month h/o chest congestion with non productive cough, no fever or chills, no sinus pressure or ear pain.Reportedly has good appetite, regular bowel movements and no skin breakdown    Review of Systems See HPI Denies recent fever or chills. Denies sinus pressure, nasal congestion, ear pain or sore throat. Denies chest congestion, productive cough or wheezing. Denies chest pains, palpitations and leg swelling Denies abdominal pain, nausea, vomiting,diarrhea or constipation.   Denies dysuria, frequency, hesitancy or incontinence. Denies headaches, seizures, numbness, or tingling. Denies depression, anxiety or insomnia. Denies skin break down or rash.        Objective:   Physical Exam Patient alert and oriented and in no cardiopulmonary distress.  HEENT: No facial asymmetry, EOMI, no sinus tenderness,  oropharynx pink and moist.  Neck supple no adenopathy.  Chest: Clear to auscultation bilaterally.decreased though adequate sir entry bilaterally  CVS: S1, S2 no murmurs, no S3.  ABD: Soft non tender. Bowel sounds normal.  Ext: No edema  MS: decreased  ROM spine, shoulders, hips and knees.  Skin: Intact, no ulcerations or rash noted.  Psych: Good eye contact, . Memory loss not anxious or depressed appearing.  CNS: CN 2-12 intact, power, tone and sensation decreased in right upper and lower extremities following CVa         Assessment & Plan:

## 2011-06-27 NOTE — Assessment & Plan Note (Signed)
Chronic uncontrolled cough, non productive med prescribed

## 2011-06-28 ENCOUNTER — Ambulatory Visit (HOSPITAL_COMMUNITY): Payer: Medicare Other

## 2011-07-05 ENCOUNTER — Ambulatory Visit (HOSPITAL_COMMUNITY)
Admission: RE | Admit: 2011-07-05 | Discharge: 2011-07-05 | Disposition: A | Discharge status: Home / Self Care | Payer: Medicare Other | Source: Ambulatory Visit | Attending: Family Medicine | Admitting: Family Medicine

## 2011-07-05 DIAGNOSIS — Z1231 Encounter for screening mammogram for malignant neoplasm of breast: Secondary | ICD-10-CM | POA: Insufficient documentation

## 2011-07-05 DIAGNOSIS — R928 Other abnormal and inconclusive findings on diagnostic imaging of breast: Secondary | ICD-10-CM | POA: Diagnosis not present

## 2011-07-05 DIAGNOSIS — Z139 Encounter for screening, unspecified: Secondary | ICD-10-CM

## 2011-07-07 ENCOUNTER — Other Ambulatory Visit: Payer: Self-pay | Admitting: Family Medicine

## 2011-07-07 DIAGNOSIS — R928 Other abnormal and inconclusive findings on diagnostic imaging of breast: Secondary | ICD-10-CM

## 2011-07-21 ENCOUNTER — Ambulatory Visit (HOSPITAL_COMMUNITY)
Admission: RE | Admit: 2011-07-21 | Discharge: 2011-07-21 | Disposition: A | Discharge status: Home / Self Care | Payer: Medicare Other | Source: Ambulatory Visit | Attending: Family Medicine | Admitting: Family Medicine

## 2011-07-21 DIAGNOSIS — R928 Other abnormal and inconclusive findings on diagnostic imaging of breast: Secondary | ICD-10-CM | POA: Diagnosis not present

## 2011-08-16 ENCOUNTER — Other Ambulatory Visit: Payer: Self-pay | Admitting: Cardiology

## 2011-08-31 DIAGNOSIS — M069 Rheumatoid arthritis, unspecified: Secondary | ICD-10-CM | POA: Diagnosis not present

## 2011-09-05 ENCOUNTER — Other Ambulatory Visit: Payer: Self-pay | Admitting: Family Medicine

## 2011-09-30 ENCOUNTER — Encounter: Payer: Self-pay | Admitting: Cardiology

## 2011-09-30 ENCOUNTER — Ambulatory Visit (INDEPENDENT_AMBULATORY_CARE_PROVIDER_SITE_OTHER): Payer: Medicare Other | Admitting: Cardiology

## 2011-09-30 VITALS — BP 132/78 | HR 113 | Ht 67.0 in | Wt 156.0 lb

## 2011-09-30 DIAGNOSIS — R609 Edema, unspecified: Secondary | ICD-10-CM

## 2011-09-30 DIAGNOSIS — I2510 Atherosclerotic heart disease of native coronary artery without angina pectoris: Secondary | ICD-10-CM | POA: Diagnosis not present

## 2011-09-30 DIAGNOSIS — R000 Tachycardia, unspecified: Secondary | ICD-10-CM

## 2011-09-30 DIAGNOSIS — I10 Essential (primary) hypertension: Secondary | ICD-10-CM

## 2011-09-30 DIAGNOSIS — D649 Anemia, unspecified: Secondary | ICD-10-CM

## 2011-09-30 MED ORDER — CHLORTHALIDONE 25 MG PO TABS: 12.5000 mg | ORAL_TABLET | Freq: Every day | ORAL | Status: DC

## 2011-09-30 NOTE — Assessment & Plan Note (Signed)
Minimal anemia.  Management by PCP.

## 2011-09-30 NOTE — Assessment & Plan Note (Addendum)
Blood pressure control has been excellent with current medications.  In an attempt to improve edema, amlodipine will be discontinued and chlorthalidone started at a dose of 12.5 mg per day.

## 2011-09-30 NOTE — Progress Notes (Deleted)
Name: Danielle Cruz    DOB: [birth year]  Age: 56 y.o.  MR#: [id number]       PCP:  Syliva Overman, MD      Insurance: @PAYORNAME @   CC:    Chief Complaint  Patient presents with  . LE Edema    No other complaints - Med list/TC    VS BP 132/78  Pulse 113  Ht 5\' 7"  (1.702 m)  Wt 156 lb (70.761 kg)  BMI 24.43 kg/m2  SpO2 95%  Weights Current Weight  09/30/11 156 lb (70.761 kg)  01/11/11 153 lb 1.9 oz (69.455 kg)  01/08/11 153 lb (69.4 kg)    Blood Pressure  BP Readings from Last 3 Encounters:  09/30/11 132/78  06/15/11 120/70  01/11/11 126/64     Admit date:  (Not on file) Last encounter with RMR:  08/16/2011   Allergy No Known Allergies  Current Outpatient Prescriptions  Medication Sig Dispense Refill  . amLODipine (NORVASC) 10 MG tablet TAKE 1 TABLET BY MOUTH EVERY DAY  30 tablet  12  . aspirin (BAYER ASPIRIN) 325 MG tablet Take 325 mg by mouth daily. Take one by mouth once daily       . atorvastatin (LIPITOR) 80 MG tablet Take 1 tablet (80 mg total) by mouth daily. To replace pravastatin  30 tablet  11  . benzonatate (TESSALON PERLES) 100 MG capsule Take 1 capsule (100 mg total) by mouth every 6 (six) hours as needed for cough.  30 capsule  0  . Cholecalciferol (VITAMIN D) 400 UNITS capsule Take 400 Units by mouth daily. Take 1 tablet by mouth daily       . FERROUS GLUCONATE IRON PO Take by mouth.        . leflunomide (ARAVA) 20 MG tablet Take 20 mg by mouth daily.        . metoprolol succinate (TOPROL-XL) 25 MG 24 hr tablet TAKE 1 TABLET BY MOUTH EVERY DAY  30 tablet  4  . oxybutynin (DITROPAN) 5 MG tablet TAKE  1/2 TABLET BY MOUTH TWICE DAILY  30 tablet  3  . TraMADol HCl 50 MG TBSO Take by mouth 4 (four) times daily. Take one tablet by mouth four times a day        Current Facility-Administered Medications  Medication Dose Route Frequency Provider Last Rate Last Dose  . Influenza (>/= 3 years) inactive virus vaccine (FLVIRIN/FLUZONE) injection SUSP 0.5 mL  0.5 mL  Intramuscular Once Kerri Perches, MD        Discontinued Meds:   There are no discontinued medications.  Patient Active Problem List  Diagnosis  . HYPERLIPIDEMIA  . ANEMIA, NORMOCYTIC  . HYPERTENSION  . ATHEROSCLEROTIC CARDIOVASCULAR DISEASE  . CVA WITH RIGHT HEMIPARESIS  . Elevated transaminase level  . Tobacco abuse, in remission  . Rheumatoid arthritis  . Colonic polyp  . Dermatitis  . Cough    LABS No visits with results within 3 Month(s) from this visit. Latest known visit with results is:  Office Visit on 06/15/2011  Component Date Value  . WBC 06/15/2011 8.3   . RBC 06/15/2011 4.08   . Hemoglobin 06/15/2011 12.2   . HCT 06/15/2011 36.9   . MCV 06/15/2011 90.4   . [facility] 06/15/2011 29.9   . MCHC 06/15/2011 33.1   . RDW 06/15/2011 15.6*  . Platelets 06/15/2011 285   . Neutrophils Relative 06/15/2011 58   . Neutro Abs 06/15/2011 4.8   . Lymphocytes Relative 06/15/2011 28   .  Lymphs Abs 06/15/2011 2.4   . Monocytes Relative 06/15/2011 13*  . Monocytes Absolute 06/15/2011 1.0   . Eosinophils Relative 06/15/2011 1   . Eosinophils Absolute 06/15/2011 0.1   . Basophils Relative 06/15/2011 0   . Basophils Absolute 06/15/2011 0.0   . Smear Review 06/15/2011 Criteria for review not met   . Sodium 06/15/2011 144   . Potassium 06/15/2011 4.0   . Chloride 06/15/2011 107   . CO2 06/15/2011 26   . Glucose, Bld 06/15/2011 87   . BUN 06/15/2011 7   . Creat 06/15/2011 0.51   . Calcium 06/15/2011 9.1   . Cholesterol 06/15/2011 140   . Triglycerides 06/15/2011 114   . HDL 06/15/2011 30*  . Total CHOL/HDL Ratio 06/15/2011 4.7   . VLDL 06/15/2011 23   . LDL Cholesterol 06/15/2011 87   . TSH 06/15/2011 2.454   . Vit D, 25-Hydroxy 06/15/2011 57   . Total Bilirubin 06/15/2011 0.3   . Bilirubin, Direct 06/15/2011 0.1   . Indirect Bilirubin 06/15/2011 0.2   . Alkaline Phosphatase 06/15/2011 105   . AST 06/15/2011 34   . ALT 06/15/2011 31   . Total Protein 06/15/2011  7.0   . Albumin 06/15/2011 3.6      Results for this Opt Visit:     Results for orders placed in visit on 06/15/11  CBC WITH DIFFERENTIAL      Component Value Range   WBC 8.3  4.0 - 10.5 K/uL   RBC 4.08  3.87 - 5.11 MIL/uL   Hemoglobin 12.2  12.0 - 15.0 g/dL   HCT 16.1  09.6 - 04.5 %   MCV 90.4  78.0 - 100.0 fL   MCH 29.9  26.0 - 34.0 pg   MCHC 33.1  30.0 - 36.0 g/dL   RDW 40.9 (*) 81.1 - 91.4 %   Platelets 285  150 - 400 K/uL   Neutrophils Relative 58  43 - 77 %   Neutro Abs 4.8  1.7 - 7.7 K/uL   Lymphocytes Relative 28  12 - 46 %   Lymphs Abs 2.4  0.7 - 4.0 K/uL   Monocytes Relative 13 (*) 3 - 12 %   Monocytes Absolute 1.0  0.1 - 1.0 K/uL   Eosinophils Relative 1  0 - 5 %   Eosinophils Absolute 0.1  0.0 - 0.7 K/uL   Basophils Relative 0  0 - 1 %   Basophils Absolute 0.0  0.0 - 0.1 K/uL   Smear Review Criteria for review not met    BASIC METABOLIC PANEL      Component Value Range   Sodium 144  135 - 145 mEq/L   Potassium 4.0  3.5 - 5.3 mEq/L   Chloride 107  96 - 112 mEq/L   CO2 26  19 - 32 mEq/L   Glucose, Bld 87  70 - 99 mg/dL   BUN 7  6 - 23 mg/dL   Creat 7.82  9.56 - 2.13 mg/dL   Calcium 9.1  8.4 - 08.6 mg/dL  LIPID PANEL      Component Value Range   Cholesterol 140  0 - 200 mg/dL   Triglycerides 578  <469 mg/dL   HDL 30 (*) >62 mg/dL   Total CHOL/HDL Ratio 4.7     VLDL 23  0 - 40 mg/dL   LDL Cholesterol 87  0 - 99 mg/dL  TSH      Component Value Range   TSH 2.454  0.350 -  4.500 uIU/mL  VITAMIN D 25 HYDROXY      Component Value Range   Vit D, 25-Hydroxy 57  30 - 89 ng/mL  HEPATIC FUNCTION PANEL      Component Value Range   Total Bilirubin 0.3  0.3 - 1.2 mg/dL   Bilirubin, Direct 0.1  0.0 - 0.3 mg/dL   Indirect Bilirubin 0.2  0.0 - 0.9 mg/dL   Alkaline Phosphatase 105  39 - 117 U/L   AST 34  0 - 37 U/L   ALT 31  0 - 35 U/L   Total Protein 7.0  6.0 - 8.3 g/dL   Albumin 3.6  3.5 - 5.2 g/dL    EKG Orders placed in visit on 05/23/07  . CONVERTED CEMR  EKG     Prior Assessment and Plan Problem List as of 09/30/2011            Cardiology Problems   HYPERLIPIDEMIA   Last Assessment & Plan Note   06/15/2011 Office Visit Signed 06/27/2011  5:22 PM by Kerri Perches, MD    Low HDL otherwise good profile no med change    HYPERTENSION   Last Assessment & Plan Note   06/15/2011 Office Visit Signed 06/27/2011  5:21 PM by Kerri Perches, MD    Controlled, no change in medication     ATHEROSCLEROTIC CARDIOVASCULAR DISEASE   Last Assessment & Plan Note   01/08/2011 Office Visit Signed 01/08/2011  4:30 PM by Kathlen Brunswick, MD    No symptoms nor problems attributable to coronary disease for the past 7 years.  Our major goal will continue to be optimal control of cardiovascular risk factors.      Other   ANEMIA, NORMOCYTIC   Last Assessment & Plan Note   01/08/2011 Office Visit Signed 01/08/2011  4:29 PM by Kathlen Brunswick, MD    Minimal normocytic anemia some months ago.  CBC will be rechecked with her next blood draw.    CVA WITH RIGHT HEMIPARESIS   Last Assessment & Plan Note   01/08/2011 Office Visit Signed 01/08/2011  4:31 PM by Kathlen Brunswick, MD    No deterioration in functional status.  She continues to walk short distances with assistance and to do some housework.    Elevated transaminase level   Last Assessment & Plan Note   01/08/2011 Office Visit Signed 01/08/2011  4:33 PM by Kathlen Brunswick, MD    We will reassess with her next blood draw.    Tobacco abuse, in remission   Last Assessment & Plan Note   01/08/2011 Office Visit Signed 01/08/2011  4:35 PM by Kathlen Brunswick, MD    Patient reports that she has not relapsed and that she continues to refrain from cigarette smoking with the help of transdermal nicotine.    Rheumatoid arthritis   Colonic polyp   Dermatitis   Last Assessment & Plan Note   01/11/2011 Office Visit Signed 01/11/2011  6:12 PM by Kerri Perches, MD    Pruritic rash on dry skin, no evidence  of bacterial infection or zoster, steroid taper and improved skin care to reduce dryness. Also pt to resume using previous soap    Cough   Last Assessment & Plan Note   06/15/2011 Office Visit Signed 06/27/2011  5:26 PM by Kerri Perches, MD    Chronic uncontrolled cough, non productive med prescribed        Imaging: No results found.   FRS Calculation: Score not  calculated. Missing: Total Cholesterol

## 2011-09-30 NOTE — Assessment & Plan Note (Signed)
Doing very well 7 years following percutaneous intervention.  No symptoms at present to suggest myocardial ischemia.

## 2011-09-30 NOTE — Assessment & Plan Note (Signed)
Worsening of edema is likely multifactorial and at least partially related to prolonged sitting with legs in a dependent position, decreased activity and treatment with calcium channel antagonists.  Amlodipine will be discontinued and substituted by thiazide diuretic.  Increased activity is encouraged as well as leg elevation when the patient is seated.  Information regarding a low-salt diet was provided.  Patient will monitor blood pressure at home and return in one month for reassessment by the cardiology nurses.  Electrolytes and renal function will be followed subsequent to initiation of diuretic therapy.

## 2011-09-30 NOTE — Patient Instructions (Addendum)
Your physician recommends that you schedule a follow-up appointment in:  1 - 1 year follow up 2 - 1 month nurse visit to assess swelling and blood pressure  Your physician has recommended you make the following change in your medication:  1 - STOP Amlodipine (Norvasc) 2 - START Chlorthalidone 12.5 mg daily  Your physician recommends that you return for lab work in: 1 month (On or around July 11/01/11)  Increase activity gradually  Elevate legs  Low salt diet (Information included)  Your physician has requested that you regularly monitor and record your blood pressure readings at home. Please use the same machine at the same time of day to check your readings and record them to bring to your follow-up visit.

## 2011-09-30 NOTE — Progress Notes (Signed)
Patient ID: Danielle Cruz, female   DOB: 1955-12-10, 56 y.o.   MRN: [id number]  HPI: Danielle Cruz is seen in the office today at the request of her family for evaluation of peripheral edema.  This was present when she was last seen 9 months ago, but has progressed.  Although she is able to ambulate, she spends most of the day sitting in her wheelchair without her legs elevated.  She has not made any effort to restrict salt intake.  She is treated with high-dose calcium channel antagonist.  Generally, she has done well since her last office visit.  She recently manifested URI symptoms, but these are improving.  She has not been hospitalized or required urgent medical care.  Prior to Admission medications   Medication Sig Start Date End Date Taking? Authorizing Provider  amLODipine (NORVASC) 10 MG tablet TAKE 1 TABLET BY MOUTH EVERY DAY 02/04/11  Yes Kathlen Brunswick, MD  aspirin (BAYER ASPIRIN) 325 MG tablet Take 325 mg by mouth daily. Take one by mouth once daily    Yes Historical Provider, MD  atorvastatin (LIPITOR) 80 MG tablet Take 1 tablet (80 mg total) by mouth daily. To replace pravastatin 01/08/11 01/08/12 Yes Kathlen Brunswick, MD  benzonatate (TESSALON PERLES) 100 MG capsule Take 1 capsule (100 mg total) by mouth every 6 (six) hours as needed for cough. 06/15/11 06/14/12 Yes Kerri Perches, MD  Cholecalciferol (VITAMIN D) 400 UNITS capsule Take 400 Units by mouth daily. Take 1 tablet by mouth daily    Yes Historical Provider, MD  FERROUS GLUCONATE IRON PO Take by mouth.     Yes Historical Provider, MD  leflunomide (ARAVA) 20 MG tablet Take 20 mg by mouth daily.     Yes Historical Provider, MD  metoprolol succinate (TOPROL-XL) 25 MG 24 hr tablet TAKE 1 TABLET BY MOUTH EVERY DAY 08/16/11  Yes Kathlen Brunswick, MD  oxybutynin (DITROPAN) 5 MG tablet TAKE  1/2 TABLET BY MOUTH TWICE DAILY 09/05/11  Yes Kerri Perches, MD  TraMADol HCl 50 MG TBSO Take by mouth 4 (four) times daily. Take one tablet by  mouth four times a day    Yes Historical Provider, MD  No Known Allergies    Past medical history, social history, and family history reviewed and updated.  ROS: Denies orthopnea, PND and exertional dyspnea although her lifestyle is very sedentary.  She has had no palpitations, lightheadedness or syncope.  PHYSICAL EXAM: BP 132/78  Pulse 113  Ht 5\' 7"  (1.702 m)  Wt 70.761 kg (156 lb)  BMI 24.43 kg/m2  SpO2 95%  General-Well developed; no acute distress Body habitus-proportionate weight and height Neck-No JVD; no carotid bruits; neck fullness-question thyromegaly. Lungs-clear lung fields Except for scattered history; resonant to percussion Cardiovascular-normal PMI; normal S1 and S2; grade 2/6 early systolic ejection murmur at the cardiac base Abdomen-normal bowel sounds; soft and non-tender without masses or organomegaly Musculoskeletal-No deformities, no cyanosis or clubbing Neurologic-Normal cranial nerves; symmetric strength and tone Skin-Warm, no significant lesions Extremities-distal pulses intact; 1-2+ ankle edema  Rhythm Strip: Sinus tachycardia at a rate of 102 BPM.  ASSESSMENT AND PLAN:  Danielle Bidwell Bing, MD 09/30/2011 11:21 AM

## 2011-10-20 ENCOUNTER — Other Ambulatory Visit: Payer: Self-pay | Admitting: *Deleted

## 2011-10-20 DIAGNOSIS — R000 Tachycardia, unspecified: Secondary | ICD-10-CM

## 2011-10-27 ENCOUNTER — Ambulatory Visit (INDEPENDENT_AMBULATORY_CARE_PROVIDER_SITE_OTHER): Payer: Medicare Other | Admitting: Family Medicine

## 2011-10-27 ENCOUNTER — Other Ambulatory Visit (HOSPITAL_COMMUNITY)
Admission: RE | Admit: 2011-10-27 | Discharge: 2011-10-27 | Disposition: A | Discharge status: Home / Self Care | Payer: Medicare Other | Source: Ambulatory Visit | Attending: Family Medicine | Admitting: Family Medicine

## 2011-10-27 VITALS — BP 142/80 | HR 78 | Resp 18 | Wt 156.1 lb

## 2011-10-27 DIAGNOSIS — Z0000 Encounter for general adult medical examination without abnormal findings: Secondary | ICD-10-CM

## 2011-10-27 DIAGNOSIS — Z124 Encounter for screening for malignant neoplasm of cervix: Secondary | ICD-10-CM | POA: Diagnosis not present

## 2011-10-27 DIAGNOSIS — Z1211 Encounter for screening for malignant neoplasm of colon: Secondary | ICD-10-CM

## 2011-10-27 DIAGNOSIS — E785 Hyperlipidemia, unspecified: Secondary | ICD-10-CM

## 2011-10-27 LAB — POC HEMOCCULT BLD/STL (OFFICE/1-CARD/DIAGNOSTIC): Fecal Occult Blood, POC: NEGATIVE

## 2011-10-27 NOTE — Patient Instructions (Addendum)
F/u in 4 month  You did very well with your exam   You will be referred for a screening colonoscopy  Handicap sticker and recliner chair from the office  Today. Fasting lipid, and CMP in mid August.

## 2011-10-28 ENCOUNTER — Encounter: Payer: Self-pay | Admitting: Cardiology

## 2011-10-28 ENCOUNTER — Encounter (INDEPENDENT_AMBULATORY_CARE_PROVIDER_SITE_OTHER): Payer: Self-pay | Admitting: *Deleted

## 2011-10-28 ENCOUNTER — Ambulatory Visit (INDEPENDENT_AMBULATORY_CARE_PROVIDER_SITE_OTHER): Payer: Medicare Other

## 2011-10-28 ENCOUNTER — Telehealth: Payer: Self-pay | Admitting: Nurse Practitioner

## 2011-10-28 VITALS — BP 140/74 | HR 88 | Ht 67.0 in | Wt 154.1 lb

## 2011-10-28 DIAGNOSIS — I10 Essential (primary) hypertension: Secondary | ICD-10-CM

## 2011-10-28 LAB — BASIC METABOLIC PANEL
BUN: 6 mg/dL (ref 6–23)
CO2: 28 mEq/L (ref 19–32)
Calcium: 9.5 mg/dL (ref 8.4–10.5)
Chloride: 101 mEq/L (ref 96–112)
Creat: 0.63 mg/dL (ref 0.50–1.10)
Glucose, Bld: 107 mg/dL — ABNORMAL HIGH (ref 70–99)
Potassium: 2.6 mEq/L — CL (ref 3.5–5.3)
Sodium: 139 mEq/L (ref 135–145)

## 2011-10-28 NOTE — Telephone Encounter (Signed)
I received a call from [facility] labs this evening alerting me to a critical potassium of 2.6 on Danielle Cruz.  The collection date on the specimen is 10/20/2011 and the tech wasn't sure as to why it was only resulting tonight and that if in fact it was drawn on 7/3, it should be repeated.  I searched the pts chart and confirmed that it was collected on 7/3, or at least appears to have been collected on that day, per EPIC time stamping.  I called the patient's home to discuss in hopes of better understanding when she had her blood drawn however there was no answer @ her home.  I have left a message on the after hours voicemail in the [place] office for them to look into the matter in the AM.

## 2011-10-28 NOTE — Progress Notes (Signed)
**Note De-Identified  Obfuscation** S: Pt. Arrives in office for a 1 month BP check and to assess swelling. B: On last OV with Dr. Dietrich Pates on 6-13 pt. was advised to stop taking Amlodipine and start taking Chlorthalidone 12.5 mg qd to improve edema. A: Pt. Has no complaints at this time with no edema. Her BP this morning is 140/74 and at last OV her BP was 132/78. Her BP diary has been scanned into pt's chart and a copy pinned to board at Dr. Marvel Plan desk at nursing station. R: Pt. Advised to continue current medical treatment and we will contact her with Dr. Marvel Plan recommendations./LV   Cardiology Attending  BP log reveals generally excellent control of hypertension.  Edema has resolved as well; however, she developed severe hypokalemia.  Potassium replacement and followup laboratory already requested.  Felton Bing, MD 11/01/2011, 1:19 PM

## 2011-10-29 ENCOUNTER — Telehealth: Payer: Self-pay | Admitting: Cardiology

## 2011-10-29 DIAGNOSIS — I10 Essential (primary) hypertension: Secondary | ICD-10-CM

## 2011-10-29 NOTE — Telephone Encounter (Signed)
Cannot reach pt. at home or work phone numbers. Left detailed message on pt's VM to have BMET drawn asap and gave hours for [facility] lab located across the street from this office and lab order has been faxed to [place]./LV

## 2011-10-29 NOTE — Telephone Encounter (Signed)
V/M FROM CHRIS BURGE ON THIS PT. HE WAS ON CALL AND GOT A REPORT ON LABS POTASSIUM  2.6 FROM 7/3. HE JUTS GOT CALL ABOUT IT 10/28/11. IF WE HAVE NOT ADDRESS ALREADY NEED TO ORDER A REPEAT.

## 2011-10-29 NOTE — Telephone Encounter (Signed)
**Note De-Identified  Obfuscation** See 7-12 phone note./LV

## 2011-10-30 ENCOUNTER — Encounter: Payer: Self-pay | Admitting: Cardiology

## 2011-10-30 DIAGNOSIS — E876 Hypokalemia: Secondary | ICD-10-CM | POA: Insufficient documentation

## 2011-10-31 ENCOUNTER — Encounter: Payer: Self-pay | Admitting: Cardiology

## 2011-11-01 ENCOUNTER — Other Ambulatory Visit: Payer: Self-pay | Admitting: *Deleted

## 2011-11-01 DIAGNOSIS — E876 Hypokalemia: Secondary | ICD-10-CM

## 2011-11-01 MED ORDER — SPIRONOLACTONE 25 MG PO TABS: 25.0000 mg | ORAL_TABLET | Freq: Every day | ORAL | Status: DC

## 2011-11-01 MED ORDER — POTASSIUM CHLORIDE CRYS ER 20 MEQ PO TBCR: EXTENDED_RELEASE_TABLET | ORAL | Status: DC

## 2011-11-04 ENCOUNTER — Telehealth: Payer: Self-pay | Admitting: *Deleted

## 2011-11-04 DIAGNOSIS — I10 Essential (primary) hypertension: Secondary | ICD-10-CM | POA: Diagnosis not present

## 2011-11-05 ENCOUNTER — Telehealth: Payer: Self-pay | Admitting: Family Medicine

## 2011-11-05 ENCOUNTER — Encounter: Payer: Self-pay | Admitting: Cardiology

## 2011-11-05 DIAGNOSIS — R7301 Impaired fasting glucose: Secondary | ICD-10-CM | POA: Insufficient documentation

## 2011-11-05 LAB — BASIC METABOLIC PANEL
BUN: 6 mg/dL (ref 6–23)
CO2: 28 mEq/L (ref 19–32)
Calcium: 9.6 mg/dL (ref 8.4–10.5)
Chloride: 102 mEq/L (ref 96–112)
Creat: 0.68 mg/dL (ref 0.50–1.10)
Glucose, Bld: 127 mg/dL — ABNORMAL HIGH (ref 70–99)
Potassium: 4.1 mEq/L (ref 3.5–5.3)
Sodium: 138 mEq/L (ref 135–145)

## 2011-11-05 NOTE — Telephone Encounter (Signed)
To the doctor please call at [phone] when ready

## 2011-11-08 ENCOUNTER — Other Ambulatory Visit: Payer: Self-pay | Admitting: *Deleted

## 2011-11-08 DIAGNOSIS — E876 Hypokalemia: Secondary | ICD-10-CM

## 2011-11-08 NOTE — Telephone Encounter (Signed)
Daughter in to pick up letter and informed that from now on she needs to come in the office with her mother.

## 2011-11-08 NOTE — Telephone Encounter (Signed)
pls write a note stating date Kadeidra was in the office and state that pt needs assistance with transport . I did not see her daughter, ex[plain to her in the future she needs to come in the room with her mom so I can know that she is here with her Mom, I will sign this note

## 2011-11-10 ENCOUNTER — Encounter: Payer: Self-pay | Admitting: Family Medicine

## 2011-11-10 DIAGNOSIS — Z0000 Encounter for general adult medical examination without abnormal findings: Secondary | ICD-10-CM | POA: Insufficient documentation

## 2011-11-10 NOTE — Assessment & Plan Note (Signed)
Pelvic and breast exam at visit within normal and documented in body of note.  Pt referred for screening colonoscopy  The importance of a healthy diet low in fat and carbohydrates and sweets is discussed and encouraged

## 2011-11-10 NOTE — Progress Notes (Signed)
  Subjective:    Patient ID: Danielle Cruz, female    DOB: 06-24-1955, 56 y.o.   MRN: [id number]  HPI Pt in for pelvic and breast exam.  Spouse reports that she is doing well, appetite is good and bowel movements regualr. She has no skin breakdown. Request is made for handicap sticker also a lift chair, both of which are appropriate   Review of Systems See HPI, history from spouse Denies recent fever or chills. Denies sinus pressure, nasal congestion, ear pain or sore throat. Denies chest congestion, productive cough or wheezing. Denies skin break down or rash.        Objective:   Physical Exam Pleasant female alert, with expressive aphasia in no c/p distress Breast: no asymetry , no nipple discharge  Or inversion. No masses, no supraclavicular or axillary adenopathy  Pelvic: external genitalia normal, no ulceration , no inguinal adenopathy, labia majora and minora normal Vaginal walls moist, no significant atrophy.Physiologic appearing vaginal discharge Uterus normal sized, no adnexal masses palpable, no cervical motion or adnexal tenderness  Rectal: no mass, guaiac negative stool       Assessment & Plan:

## 2011-11-22 ENCOUNTER — Other Ambulatory Visit: Payer: Self-pay | Admitting: *Deleted

## 2011-11-22 DIAGNOSIS — E876 Hypokalemia: Secondary | ICD-10-CM

## 2012-01-06 ENCOUNTER — Other Ambulatory Visit: Payer: Self-pay | Admitting: Family Medicine

## 2012-01-06 ENCOUNTER — Other Ambulatory Visit: Payer: Self-pay | Admitting: Cardiology

## 2012-01-06 DIAGNOSIS — M069 Rheumatoid arthritis, unspecified: Secondary | ICD-10-CM | POA: Diagnosis not present

## 2012-01-18 ENCOUNTER — Other Ambulatory Visit: Payer: Self-pay | Admitting: Cardiology

## 2012-03-01 ENCOUNTER — Encounter: Payer: Self-pay | Admitting: Family Medicine

## 2012-03-01 ENCOUNTER — Ambulatory Visit: Payer: Medicare Other | Admitting: Family Medicine

## 2012-05-08 DIAGNOSIS — M069 Rheumatoid arthritis, unspecified: Secondary | ICD-10-CM | POA: Diagnosis not present

## 2012-06-08 ENCOUNTER — Other Ambulatory Visit: Payer: Self-pay | Admitting: Cardiology

## 2012-06-12 ENCOUNTER — Other Ambulatory Visit: Payer: Self-pay | Admitting: Cardiology

## 2012-06-12 MED ORDER — METOPROLOL SUCCINATE ER 25 MG PO TB24: 25.0000 mg | ORAL_TABLET | Freq: Every day | ORAL | Status: DC

## 2012-07-17 ENCOUNTER — Other Ambulatory Visit: Payer: Self-pay | Admitting: Family Medicine

## 2012-09-13 ENCOUNTER — Encounter: Payer: Self-pay | Admitting: Cardiology

## 2012-09-13 ENCOUNTER — Ambulatory Visit (INDEPENDENT_AMBULATORY_CARE_PROVIDER_SITE_OTHER): Payer: Medicare Other | Admitting: Cardiology

## 2012-09-13 VITALS — BP 127/78 | HR 88 | Ht 69.0 in | Wt 153.6 lb

## 2012-09-13 DIAGNOSIS — I10 Essential (primary) hypertension: Secondary | ICD-10-CM

## 2012-09-13 DIAGNOSIS — I2510 Atherosclerotic heart disease of native coronary artery without angina pectoris: Secondary | ICD-10-CM

## 2012-09-13 DIAGNOSIS — D649 Anemia, unspecified: Secondary | ICD-10-CM | POA: Diagnosis not present

## 2012-09-13 DIAGNOSIS — R7401 Elevation of levels of liver transaminase levels: Secondary | ICD-10-CM

## 2012-09-13 DIAGNOSIS — E785 Hyperlipidemia, unspecified: Secondary | ICD-10-CM

## 2012-09-13 DIAGNOSIS — R7402 Elevation of levels of lactic acid dehydrogenase (LDH): Secondary | ICD-10-CM | POA: Diagnosis not present

## 2012-09-13 NOTE — Progress Notes (Deleted)
Name: Danielle Cruz    DOB: 21-Sep-1955  Age: 57 y.o.  MR#: [id number]       PCP:  Syliva Overman, MD      Insurance: Payor: MEDICARE / Plan: MEDICARE PART A AND B / Product Type: *No Product type* /   CC:   No chief complaint on file.  NO LIST VS Filed Vitals:   09/13/12 1352  BP: 127/78  Pulse: 88  Height: 5\' 9"  (1.753 m)  Weight: 153 lb 9 oz (69.655 kg)    Weights Current Weight  09/13/12 153 lb 9 oz (69.655 kg)  10/28/11 154 lb 1.9 oz (69.908 kg)  10/27/11 156 lb 1.3 oz (70.797 kg)    Blood Pressure  BP Readings from Last 3 Encounters:  09/13/12 127/78  10/28/11 140/74  10/27/11 142/80     Admit date:  (Not on file) Last encounter with RMR:  06/08/2012   Allergy Review of patient's allergies indicates no known allergies.  Current Outpatient Prescriptions  Medication Sig Dispense Refill  . aspirin (BAYER ASPIRIN) 325 MG tablet Take 325 mg by mouth daily. Take one by mouth once daily       . atorvastatin (LIPITOR) 80 MG tablet TAKE 1 TABLET BY MOUTH EVERY DAY ** REPLACES PRAVASTATIN  30 tablet  10  . chlorthalidone (HYGROTON) 25 MG tablet Take 0.5 tablets (12.5 mg total) by mouth daily.  30 tablet  12  . Cholecalciferol (VITAMIN D) 400 UNITS capsule Take 400 Units by mouth daily. Take 1 tablet by mouth daily       . FERROUS GLUCONATE IRON PO Take by mouth.        Marland Kitchen HYDROcodone-acetaminophen (NORCO) 10-325 MG per tablet       . leflunomide (ARAVA) 20 MG tablet Take 20 mg by mouth daily.        . metoprolol succinate (TOPROL-XL) 25 MG 24 hr tablet Take 1 tablet (25 mg total) by mouth daily.  30 tablet  5  . oxybutynin (DITROPAN) 5 MG tablet TAKE 1/2 TABLET BY MOUTH TWICE DAILY  30 tablet  3  . potassium chloride SA (K-DUR,KLOR-CON) 20 MEQ tablet Take 40 meq (2 tabs) three times on day 1 then one tablet a day thereafter  45 tablet  6  . spironolactone (ALDACTONE) 25 MG tablet Take 1 tablet (25 mg total) by mouth daily.  30 tablet  12  . TraMADol HCl 50 MG TBSO Take by mouth  4 (four) times daily. Take one tablet by mouth four times a day       . [DISCONTINUED] oxybutynin (DITROPAN) 5 MG tablet TAKE  1/2 TABLET BY MOUTH TWICE DAILY  30 tablet  3   Current Facility-Administered Medications  Medication Dose Route Frequency Provider Last Rate Last Dose  . Influenza (>/= 3 years) inactive virus vaccine (FLVIRIN/FLUZONE) injection SUSP 0.5 mL  0.5 mL Intramuscular Once Kerri Perches, MD        Discontinued Meds:   There are no discontinued medications.  Patient Active Problem List   Diagnosis Date Noted  . Routine general medical examination at a health care facility 11/10/2011  . Fasting hyperglycemia 11/05/2011  . Hypokalemia 10/30/2011  . Elevated transaminase level 01/08/2011  . Tobacco abuse, in remission   . Rheumatoid arthritis   . Colonic polyp   . CVA WITH RIGHT HEMIPARESIS 03/16/2010  . ATHEROSCLEROTIC CARDIOVASCULAR DISEASE 01/14/2010  . ANEMIA, NORMOCYTIC 04/01/2009  . HYPERLIPIDEMIA 11/14/2008  . HYPERTENSION 11/14/2008    LABS  Component Value Date/Time   NA 138 11/04/2011 1540   NA 139 10/20/2011 1551   NA 144 06/15/2011 1723   K 4.1 11/04/2011 1540   K 2.6* 10/20/2011 1551   K 4.0 06/15/2011 1723   CL 102 11/04/2011 1540   CL 101 10/20/2011 1551   CL 107 06/15/2011 1723   CO2 28 11/04/2011 1540   CO2 28 10/20/2011 1551   CO2 26 06/15/2011 1723   GLUCOSE 127* 11/04/2011 1540   GLUCOSE 107* 10/20/2011 1551   GLUCOSE 87 06/15/2011 1723   BUN 6 11/04/2011 1540   BUN 6 10/20/2011 1551   BUN 7 06/15/2011 1723   CREATININE 0.68 11/04/2011 1540   CREATININE 0.63 10/20/2011 1551   CREATININE 0.51 06/15/2011 1723   CREATININE 0.58 06/05/2010 1707   CREATININE 0.55 01/19/2010 2231   CREATININE 0.54 11/13/2009 2104   CALCIUM 9.6 11/04/2011 1540   CALCIUM 9.5 10/20/2011 1551   CALCIUM 9.1 06/15/2011 1723   GFRNONAA >60 04/28/2009 0831   GFRNONAA >60 03/28/2009 1715   GFRNONAA >60 11/07/2008 1424   GFRAA  Value: >60        The eGFR has been calculated using the  MDRD equation. This calculation has not been validated in all clinical situations. eGFR's persistently <60 mL/min signify possible Chronic Kidney Disease. 04/28/2009 0831   GFRAA  Value: >60        The eGFR has been calculated using the MDRD equation. This calculation has not been validated in all clinical situations. eGFR's persistently <60 mL/min signify possible Chronic Kidney Disease. 03/28/2009 1715   GFRAA  Value: >60        The eGFR has been calculated using the MDRD equation. This calculation has not been validated in all clinical situations. eGFR's persistently <60 mL/min signify possible Chronic Kidney Disease. 11/07/2008 1424   CMP     Component Value Date/Time   NA 138 11/04/2011 1540   K 4.1 11/04/2011 1540   CL 102 11/04/2011 1540   CO2 28 11/04/2011 1540   GLUCOSE 127* 11/04/2011 1540   BUN 6 11/04/2011 1540   CREATININE 0.68 11/04/2011 1540   CREATININE 0.58 06/05/2010 1707   CALCIUM 9.6 11/04/2011 1540   PROT 7.0 06/15/2011 1723   PROT 7.0 06/15/2011 1723   ALBUMIN 3.6 06/15/2011 1723   ALBUMIN 3.6 06/15/2011 1723   AST 34 06/15/2011 1723   AST 37 06/15/2011 1723   ALT 31 06/15/2011 1723   ALT 29 06/15/2011 1723   ALKPHOS 105 06/15/2011 1723   ALKPHOS 104 06/15/2011 1723   BILITOT 0.3 06/15/2011 1723   BILITOT 0.3 06/15/2011 1723   GFRNONAA >60 04/28/2009 0831   GFRAA  Value: >60        The eGFR has been calculated using the MDRD equation. This calculation has not been validated in all clinical situations. eGFR's persistently <60 mL/min signify possible Chronic Kidney Disease. 04/28/2009 0831       Component Value Date/Time   WBC 8.3 06/15/2011 1723   WBC 6.1 01/19/2010 2231   WBC 6.5 08/06/2009 2336   HGB 12.2 06/15/2011 1723   HGB 12.1 01/19/2010 2231   HGB 12.1 08/06/2009 2336   HCT 36.9 06/15/2011 1723   HCT 37.9 01/19/2010 2231   HCT 36.8 08/06/2009 2336   MCV 90.4 06/15/2011 1723   MCV 92.2 01/19/2010 2231   MCV 90.4 08/06/2009 2336    Lipid Panel     Component Value Date/Time    CHOL 140 06/15/2011 1723  TRIG 114 06/15/2011 1723   HDL 30* 06/15/2011 1723   CHOLHDL 4.7 06/15/2011 1723   VLDL 23 06/15/2011 1723   LDLCALC 87 06/15/2011 1723    ABG No results found for this basename: phart, pco2, pco2art, po2, po2art, hco3, tco2, acidbasedef, o2sat     Lab Results  Component Value Date   TSH 2.454 06/15/2011   BNP (last 3 results) No results found for this basename: PROBNP,  in the last 8760 hours Cardiac Panel (last 3 results) No results found for this basename: CKTOTAL, CKMB, TROPONINI, RELINDX,  in the last 72 hours  Iron/TIBC/Ferritin No results found for this basename: iron, tibc, ferritin     EKG Orders placed in visit on 09/13/12  . EKG 12-LEAD     Prior Assessment and Plan Problem List as of 09/13/2012   HYPERLIPIDEMIA   Last Assessment & Plan   06/15/2011 Office Visit Written 06/27/2011  5:22 PM by Kerri Perches, MD     Low HDL otherwise good profile no med change    ANEMIA, NORMOCYTIC   Last Assessment & Plan   09/30/2011 Office Visit Written 09/30/2011 12:20 PM by Kathlen Brunswick, MD     Minimal anemia.  Management by PCP.    HYPERTENSION   Last Assessment & Plan   09/30/2011 Office Visit Edited 09/30/2011  1:57 PM by Kathlen Brunswick, MD     Blood pressure control has been excellent with current medications.  In an attempt to improve edema, amlodipine will be discontinued and chlorthalidone started at a dose of 12.5 mg per day.    ATHEROSCLEROTIC CARDIOVASCULAR DISEASE   Last Assessment & Plan   09/30/2011 Office Visit Written 09/30/2011 12:20 PM by Kathlen Brunswick, MD     Doing very well 7 years following percutaneous intervention.  No symptoms at present to suggest myocardial ischemia.    CVA WITH RIGHT HEMIPARESIS   Last Assessment & Plan   01/08/2011 Office Visit Written 01/08/2011  4:31 PM by Kathlen Brunswick, MD     No deterioration in functional status.  She continues to walk short distances with assistance and to do some  housework.    Elevated transaminase level   Last Assessment & Plan   01/08/2011 Office Visit Written 01/08/2011  4:33 PM by Kathlen Brunswick, MD     We will reassess with her next blood draw.    Tobacco abuse, in remission   Last Assessment & Plan   01/08/2011 Office Visit Written 01/08/2011  4:35 PM by Kathlen Brunswick, MD     Patient reports that she has not relapsed and that she continues to refrain from cigarette smoking with the help of transdermal nicotine.    Rheumatoid arthritis   Colonic polyp   Hypokalemia   Fasting hyperglycemia   Routine general medical examination at a health care facility   Last Assessment & Plan   10/27/2011 Office Visit Written 11/10/2011 10:10 PM by Kerri Perches, MD     Pelvic and breast exam at visit within normal and documented in body of note.  Pt referred for screening colonoscopy  The importance of a healthy diet low in fat and carbohydrates and sweets is discussed and encouraged        Imaging: No results found.

## 2012-09-13 NOTE — Progress Notes (Signed)
Patient ID: Danielle Cruz, female   DOB: [birth year], 57 y.o.   MRN: [id number]  HPI: Schedule return visit for this lovely woman with previous CVA and with known coronary artery disease. Since two-vessel percutaneous intervention in 2005, she has had no cardiac symptoms and no evidence for myocardial ischemia. Lifestyle is sedentary, but strength has improved in her right lower extremity such that she walks fairly normally. She remains aphasic.    Current Outpatient Prescriptions  Medication Sig Dispense Refill  . aspirin (BAYER ASPIRIN) 325 MG tablet Take 325 mg by mouth daily. Take one by mouth once daily       . atorvastatin (LIPITOR) 80 MG tablet TAKE 1 TABLET BY MOUTH EVERY DAY ** REPLACES PRAVASTATIN  30 tablet  10  . chlorthalidone (HYGROTON) 25 MG tablet Take 0.5 tablets (12.5 mg total) by mouth daily.  30 tablet  12  . Cholecalciferol (VITAMIN D) 400 UNITS capsule Take 400 Units by mouth daily. Take 1 tablet by mouth daily       . FERROUS GLUCONATE IRON PO Take by mouth.        Marland Kitchen HYDROcodone-acetaminophen (NORCO) 10-325 MG per tablet       . leflunomide (ARAVA) 20 MG tablet Take 20 mg by mouth daily.        . metoprolol succinate (TOPROL-XL) 25 MG 24 hr tablet Take 1 tablet (25 mg total) by mouth daily.  30 tablet  5  . oxybutynin (DITROPAN) 5 MG tablet TAKE 1/2 TABLET BY MOUTH TWICE DAILY  30 tablet  3  . potassium chloride SA (K-DUR,KLOR-CON) 20 MEQ tablet Take 40 meq (2 tabs) three times on day 1 then one tablet a day thereafter  45 tablet  6  . spironolactone (ALDACTONE) 25 MG tablet Take 1 tablet (25 mg total) by mouth daily.  30 tablet  12  . TraMADol HCl 50 MG TBSO Take by mouth 4 (four) times daily. Take one tablet by mouth four times a day       . [DISCONTINUED] oxybutynin (DITROPAN) 5 MG tablet TAKE  1/2 TABLET BY MOUTH TWICE DAILY  30 tablet  3   Current Facility-Administered Medications  Medication Dose Route Frequency Provider Last Rate Last Dose  . Influenza (>/= 3 years)  inactive virus vaccine (FLVIRIN/FLUZONE) injection SUSP 0.5 mL  0.5 mL Intramuscular Once Kerri Perches, MD       No Known Allergies   Past medical history, social history, and family history reviewed and updated.  ROS: Denies chest pain, dyspnea, orthopnea, PND, palpitations, lightheadedness or syncope. No significant pedal edema noted in recent months. No hospitalizations no requirements for urgent medical care. All other systems reviewed and are negative.  PHYSICAL EXAM: BP 127/78  Pulse 88  Ht 5\' 9"  (1.753 m)  Wt 69.655 kg (153 lb 9 oz)  BMI 22.67 kg/m2;  Body mass index is 22.67 kg/(m^2). General-Well developed; no acute distress Body habitus-proportionate weight and height Neck-No JVD; no carotid bruits Lungs-clear lung fields; resonant to percussion Cardiovascular-normal PMI; normal S1 and S2; modest basilar early systolic ejection murmur Abdomen-normal bowel sounds; soft and non-tender without masses or organomegaly Musculoskeletal-No deformities, no cyanosis or clubbing Neurologic-Normal cranial nerves; symmetric strength and tone Skin-Warm, no significant lesions Extremities-distal pulses intact; trace edema  EKG:  Normal sinus rhythm; left atrial abnormality; delayed R-wave progression-cannot exclude previous septal myocardial infarction; right ventricular conduction delay; low-voltage in the chest leads. No previous tracing for comparison.  Reedley Bing, MD 09/13/2012  2:13 PM  ASSESSMENT AND PLAN

## 2012-09-13 NOTE — Patient Instructions (Addendum)
Your physician recommends that you schedule a follow-up appointment in: ONE YEAR  Your physician recommends that you increase your activity are tolerated.  Your physician has recommended you make the following change in your medication:   1) DECREASE YOUR ASPIRIN TO 81MG  ONCE DAILY  Your physician recommends that you return for lab work in: THIS WEEK (CMET,CBC,FLP) SLIPS GIVEN

## 2012-09-13 NOTE — Assessment & Plan Note (Signed)
History of minimally elevated transaminase levels, likely the result of treatment with high-dose statin. Repeat LFTs will be obtained.

## 2012-09-13 NOTE — Assessment & Plan Note (Signed)
Blood pressure control is excellent with current medication, which will be continued. Appropriate laboratory tests will be performed.

## 2012-09-13 NOTE — Assessment & Plan Note (Signed)
Hyperlipidemia suboptimally controlled with the most recent testing available to me, which is from 4 years ago. A repeat profile will be obtained.

## 2012-09-13 NOTE — Assessment & Plan Note (Signed)
Anemia had resolved as of the most recent CBC available to me from 05/2011. Testing will be repeated.

## 2012-09-13 NOTE — Assessment & Plan Note (Signed)
No apparent progression nor other manifestations of coronary disease since initial presentation and two-vessel PCI in 2005. We will continue to optimally control cardiovascular risk factors.

## 2012-09-14 ENCOUNTER — Other Ambulatory Visit: Payer: Self-pay | Admitting: Cardiology

## 2012-09-15 ENCOUNTER — Other Ambulatory Visit: Payer: Self-pay | Admitting: *Deleted

## 2012-09-15 MED ORDER — POTASSIUM CHLORIDE CRYS ER 20 MEQ PO TBCR: 20.0000 meq | EXTENDED_RELEASE_TABLET | Freq: Every day | ORAL | Status: DC

## 2012-10-02 ENCOUNTER — Ambulatory Visit: Payer: Medicare Other | Admitting: Cardiology

## 2012-11-06 DIAGNOSIS — M069 Rheumatoid arthritis, unspecified: Secondary | ICD-10-CM | POA: Diagnosis not present

## 2012-11-10 ENCOUNTER — Ambulatory Visit (INDEPENDENT_AMBULATORY_CARE_PROVIDER_SITE_OTHER): Payer: Medicare Other | Admitting: Family Medicine

## 2012-11-10 ENCOUNTER — Encounter: Payer: Self-pay | Admitting: Family Medicine

## 2012-11-10 VITALS — BP 142/88 | HR 80 | Resp 16 | Wt 156.0 lb

## 2012-11-10 DIAGNOSIS — Z1239 Encounter for other screening for malignant neoplasm of breast: Secondary | ICD-10-CM

## 2012-11-10 DIAGNOSIS — R5381 Other malaise: Secondary | ICD-10-CM

## 2012-11-10 DIAGNOSIS — Z1211 Encounter for screening for malignant neoplasm of colon: Secondary | ICD-10-CM | POA: Diagnosis not present

## 2012-11-10 DIAGNOSIS — I69959 Hemiplegia and hemiparesis following unspecified cerebrovascular disease affecting unspecified side: Secondary | ICD-10-CM

## 2012-11-10 DIAGNOSIS — Z87891 Personal history of nicotine dependence: Secondary | ICD-10-CM

## 2012-11-10 DIAGNOSIS — F17201 Nicotine dependence, unspecified, in remission: Secondary | ICD-10-CM

## 2012-11-10 DIAGNOSIS — M069 Rheumatoid arthritis, unspecified: Secondary | ICD-10-CM

## 2012-11-10 DIAGNOSIS — F17200 Nicotine dependence, unspecified, uncomplicated: Secondary | ICD-10-CM

## 2012-11-10 DIAGNOSIS — E785 Hyperlipidemia, unspecified: Secondary | ICD-10-CM | POA: Diagnosis not present

## 2012-11-10 DIAGNOSIS — I10 Essential (primary) hypertension: Secondary | ICD-10-CM

## 2012-11-10 DIAGNOSIS — R5383 Other fatigue: Secondary | ICD-10-CM | POA: Diagnosis not present

## 2012-11-10 MED ORDER — ATORVASTATIN CALCIUM 80 MG PO TABS: ORAL_TABLET | ORAL | Status: DC

## 2012-11-10 NOTE — Patient Instructions (Addendum)
F/U IN 3 month, call if you need me before  Lipid and TSH today fasting  You need to stop smoking  I will request 6 hours per day of help for you 5 days per week  You are referred for mammogram , get appointment on the way out, and also for a colonscopy

## 2012-11-11 ENCOUNTER — Encounter: Payer: Self-pay | Admitting: Family Medicine

## 2012-11-11 NOTE — Assessment & Plan Note (Signed)
Fair though sub optimal control, pt to reduce sodium intake, continue current regime

## 2012-11-11 NOTE — Assessment & Plan Note (Signed)
Updated lab past due, she is to obtain this the day of the visit. Hyperlipidemia:Low fat diet discussed and encouraged.

## 2012-11-11 NOTE — Assessment & Plan Note (Signed)
Ongoing nicotine use , not comiting to quitting Patient counseled for approximately 5 minutes regarding the health risks of ongoing nicotine use, specifically all types of cancer, heart disease, stroke and respiratory failure. The options available for help with cessation ,the behavioral changes to assist the process, and the option to either gradully reduce usage  Or abruptly stop.is also discussed. Pt is also encouraged to set specific goals in number of cigarettes used daily, as well as to set a quit date.

## 2012-11-11 NOTE — Assessment & Plan Note (Signed)
Followed and managed by rheumatologist who sh has seen in the past month, stable

## 2012-11-11 NOTE — Assessment & Plan Note (Signed)
Pt able to walk with assistance of a walker, she needs asistance with her ADL's spouse and her children getr CAP assistance for her care, current is 4 hrs  Five days per week, will request increase to 6 hrs per day

## 2012-11-11 NOTE — Progress Notes (Signed)
  Subjective:    Patient ID: Danielle Cruz, female    DOB: 1955/05/08, 57 y.o.   MRN: [id number]  HPI  Pt in wit her spouse who is her caregiver. He is the historian , as the pt has expressive aphasia following a CVa in 2010. She has not been in for over 1 year, and recently request for CAP service renewal was sent so I requested a visit to re evaluate, which I told her spouse is past due  She has seen both he rheumatologist and cardoilogist in the interim and is maintaining her own. She has need for assistance with all ADL's due to right hemiparesis, currently has 20 hours per week of CAP, will request an increase to 30 hours She still smokes, and will not commit to quitting, her spouse alos smokes, and is clearly providing the cigarettes  Review of Systems    See HPI, history from spouse Denies recent fever or chills.appetite is good, bowel movements regular Denies sinus pressure, nasal congestion. Denies chest congestion, productive cough or wheezing. Denies orhthopnea, palpitations and leg swelling Denies nausea, vomiting,diarrhea or constipation.   Denies malodorous urine or frequency.  Denies depression, anxiety or insomnia. Denies skin break down or rash.     Objective:   Physical Exam  Patient alert  and in no cardiopulmonary distress.  HEENT: No facial asymmetry, EOMI, no sinus tenderness,  oropharynx pink and moist.  Neck supple no adenopathy.  Chest: Clear to auscultation bilaterally.  CVS: S1, S2 , systolic  murmur, no S3.  ABD: Soft non tender. Bowel sounds normal.  Ext: No edema  MS: decreased  ROM spine, shoulders, hips and knees.RUE in fixed deformity with wasting following CVA  Skin: Intact, no ulcerations or rash noted.  Psych: Good eye contact,  not anxious or depressed appearing.  CNS: CN 2-12 intact, right hemiparesis and aphasia, more dense parsis in upper extremity than lowe.       Assessment & Plan:

## 2012-11-13 DIAGNOSIS — R5381 Other malaise: Secondary | ICD-10-CM | POA: Diagnosis not present

## 2012-11-13 DIAGNOSIS — E785 Hyperlipidemia, unspecified: Secondary | ICD-10-CM | POA: Diagnosis not present

## 2012-11-13 LAB — LIPID PANEL
Cholesterol: 154 mg/dL (ref 0–200)
HDL: 27 mg/dL — ABNORMAL LOW (ref >39)
LDL Cholesterol: 89 mg/dL (ref 0–99)
Total CHOL/HDL Ratio: 5.7 Ratio
Triglycerides: 192 mg/dL — ABNORMAL HIGH (ref <150)
VLDL: 38 mg/dL (ref 0–40)

## 2012-11-13 LAB — TSH: TSH: 1.919 u[IU]/mL (ref 0.350–4.500)

## 2012-11-14 ENCOUNTER — Encounter (INDEPENDENT_AMBULATORY_CARE_PROVIDER_SITE_OTHER): Payer: Self-pay | Admitting: *Deleted

## 2012-11-14 ENCOUNTER — Ambulatory Visit (HOSPITAL_COMMUNITY)
Admission: RE | Admit: 2012-11-14 | Discharge: 2012-11-14 | Disposition: A | Discharge status: Home / Self Care | Payer: Medicare Other | Source: Ambulatory Visit | Attending: Family Medicine | Admitting: Family Medicine

## 2012-11-14 DIAGNOSIS — Z1239 Encounter for other screening for malignant neoplasm of breast: Secondary | ICD-10-CM

## 2012-11-14 DIAGNOSIS — Z1231 Encounter for screening mammogram for malignant neoplasm of breast: Secondary | ICD-10-CM | POA: Insufficient documentation

## 2012-11-19 ENCOUNTER — Other Ambulatory Visit: Payer: Self-pay | Admitting: Cardiology

## 2012-11-19 ENCOUNTER — Other Ambulatory Visit: Payer: Self-pay | Admitting: Family Medicine

## 2013-01-05 ENCOUNTER — Other Ambulatory Visit: Payer: Self-pay | Admitting: Cardiology

## 2013-02-09 ENCOUNTER — Ambulatory Visit (INDEPENDENT_AMBULATORY_CARE_PROVIDER_SITE_OTHER): Payer: Medicare Other | Admitting: Family Medicine

## 2013-02-09 ENCOUNTER — Encounter: Payer: Self-pay | Admitting: Family Medicine

## 2013-02-09 VITALS — BP 144/82 | HR 89 | Resp 16

## 2013-02-09 DIAGNOSIS — I10 Essential (primary) hypertension: Secondary | ICD-10-CM

## 2013-02-09 DIAGNOSIS — F17200 Nicotine dependence, unspecified, uncomplicated: Secondary | ICD-10-CM | POA: Diagnosis not present

## 2013-02-09 DIAGNOSIS — E785 Hyperlipidemia, unspecified: Secondary | ICD-10-CM | POA: Diagnosis not present

## 2013-02-09 DIAGNOSIS — Z23 Encounter for immunization: Secondary | ICD-10-CM | POA: Diagnosis not present

## 2013-02-09 DIAGNOSIS — K635 Polyp of colon: Secondary | ICD-10-CM

## 2013-02-09 DIAGNOSIS — I69959 Hemiplegia and hemiparesis following unspecified cerebrovascular disease affecting unspecified side: Secondary | ICD-10-CM

## 2013-02-09 DIAGNOSIS — Z720 Tobacco use: Secondary | ICD-10-CM

## 2013-02-09 DIAGNOSIS — D126 Benign neoplasm of colon, unspecified: Secondary | ICD-10-CM | POA: Diagnosis not present

## 2013-02-09 DIAGNOSIS — R7301 Impaired fasting glucose: Secondary | ICD-10-CM

## 2013-02-09 LAB — CBC WITH DIFFERENTIAL/PLATELET
Basophils Absolute: 0 10*3/uL (ref 0.0–0.1)
Basophils Relative: 0 % (ref 0–1)
Eosinophils Absolute: 0.1 10*3/uL (ref 0.0–0.7)
Eosinophils Relative: 1 % (ref 0–5)
HCT: 35 % — ABNORMAL LOW (ref 36.0–46.0)
Hemoglobin: 11.8 g/dL — ABNORMAL LOW (ref 12.0–15.0)
Lymphocytes Relative: 25 % (ref 12–46)
Lymphs Abs: 1.8 10*3/uL (ref 0.7–4.0)
MCH: 30.8 pg (ref 26.0–34.0)
MCHC: 33.7 g/dL (ref 30.0–36.0)
MCV: 91.4 fL (ref 78.0–100.0)
Monocytes Absolute: 0.9 10*3/uL (ref 0.1–1.0)
Monocytes Relative: 13 % — ABNORMAL HIGH (ref 3–12)
Neutro Abs: 4.2 10*3/uL (ref 1.7–7.7)
Neutrophils Relative %: 61 % (ref 43–77)
Platelets: 263 10*3/uL (ref 150–400)
RBC: 3.83 MIL/uL — ABNORMAL LOW (ref 3.87–5.11)
RDW: 15.8 % — ABNORMAL HIGH (ref 11.5–15.5)
WBC: 7 10*3/uL (ref 4.0–10.5)

## 2013-02-09 LAB — COMPREHENSIVE METABOLIC PANEL
ALT: 41 U/L — ABNORMAL HIGH (ref 0–35)
AST: 36 U/L (ref 0–37)
Albumin: 3.9 g/dL (ref 3.5–5.2)
Alkaline Phosphatase: 108 U/L (ref 39–117)
BUN: 8 mg/dL (ref 6–23)
CO2: 28 mEq/L (ref 19–32)
Calcium: 9.5 mg/dL (ref 8.4–10.5)
Chloride: 102 mEq/L (ref 96–112)
Creat: 0.8 mg/dL (ref 0.50–1.10)
Glucose, Bld: 91 mg/dL (ref 70–99)
Potassium: 4 mEq/L (ref 3.5–5.3)
Sodium: 135 mEq/L (ref 135–145)
Total Bilirubin: 0.5 mg/dL (ref 0.3–1.2)
Total Protein: 7.7 g/dL (ref 6.0–8.3)

## 2013-02-09 LAB — LIPID PANEL
Cholesterol: 147 mg/dL (ref 0–200)
HDL: 26 mg/dL — ABNORMAL LOW (ref >39)
LDL Cholesterol: 78 mg/dL (ref 0–99)
Total CHOL/HDL Ratio: 5.7 Ratio
Triglycerides: 214 mg/dL — ABNORMAL HIGH (ref <150)
VLDL: 43 mg/dL — ABNORMAL HIGH (ref 0–40)

## 2013-02-09 NOTE — Assessment & Plan Note (Signed)
Hyperlipidemia:Low fat diet discussed and encouraged.  Updated lab today 

## 2013-02-09 NOTE — Assessment & Plan Note (Signed)

## 2013-02-09 NOTE — Progress Notes (Signed)
  Subjective:    Patient ID: Danielle Cruz, female    DOB: 09/15/1955, 58 y.o.   MRN: [id number]  HPI The PT is here for follow up and re-evaluation of chronic medical conditions, medication management and review of any available recent lab and radiology data.  Preventive health is updated, specifically  Cancer screening and Immunization.    The spouse denies any adverse reactions to current medications since the last visit.  There are no new concerns.  There are no specific complaints  Still smoking, unwilling to set a quit date but reducing use reportedly      Review of Systems See HPI. Pt has aphasia. History is obtained from her spouse who is in attendance Denies recent fever or chills. Denies sinus pressure, nasal congestion, ear pain or sore throat. Denies chest congestion, productive cough or wheezing. Denies chest pains, palpitations and leg swelling Denies abdominal pain, nausea, vomiting,diarrhea or constipation.   Denies urinary frequency or malodorous urine Chronic limitation in mobility.Uses a PWC in the home, thinks a battery is needed, will call supplier Denies headaches or  seizures Denies .pr depression, anxiety or insomnia. Denies skin break down or rash.        Objective:   Physical Exam  Patient alert  and in no cardiopulmonary distress.Aphasic, mumbles in response to questions  HEENT: No facial asymmetry, EOMI, no sinus tenderness,  oropharynx pink and moist.  Neck supple no adenopathy.  Chest: Clear to auscultation bilaterally.  CVS: S1, S2 no murmurs, no S3.  ABD: Soft non tender. Bowel sounds normal.  Ext: No edema  MS: Adequate ROM spine, shoulders, hips and knees.  Skin: Intact, no ulcerations or rash noted.  Psych: Good eye contact,  not anxious or depressed appearing.  CNS: CN 2-12 intact,reduced power in right upper and lower extremities     Assessment & Plan:

## 2013-02-09 NOTE — Assessment & Plan Note (Signed)
unchanged

## 2013-02-09 NOTE — Patient Instructions (Addendum)
f/u in 4.5 month, with rectal, call if you need me before  Flu vaccine today  CBC, lipid and cmp today  You do not need a colonoscopy at thsi time  Call supplier  to check the battery for your chair

## 2013-02-09 NOTE — Assessment & Plan Note (Signed)
Updated lab needed.  

## 2013-02-19 ENCOUNTER — Other Ambulatory Visit: Payer: Self-pay | Admitting: Family Medicine

## 2013-03-08 ENCOUNTER — Telehealth: Payer: Self-pay | Admitting: Family Medicine

## 2013-03-08 NOTE — Telephone Encounter (Signed)
Noted  

## 2013-03-09 NOTE — Telephone Encounter (Signed)
noted 

## 2013-05-07 ENCOUNTER — Telehealth: Payer: Self-pay | Admitting: Cardiovascular Disease

## 2013-05-07 MED ORDER — METOPROLOL SUCCINATE ER 25 MG PO TB24: 25.0000 mg | ORAL_TABLET | Freq: Every day | ORAL | Status: DC

## 2013-05-07 NOTE — Telephone Encounter (Signed)
Received fax refill request  Rx # [id number] Medication:  Metoprolol ER Succinate 25 mg tabs Qty 30 Sig:  Take one tablet by mouth daily Physician:  Bronson Ing

## 2013-05-07 NOTE — Telephone Encounter (Signed)
metoprolo refilled

## 2013-05-09 DIAGNOSIS — M069 Rheumatoid arthritis, unspecified: Secondary | ICD-10-CM | POA: Diagnosis not present

## 2013-06-22 ENCOUNTER — Other Ambulatory Visit: Payer: Self-pay | Admitting: Cardiology

## 2013-06-28 ENCOUNTER — Encounter (INDEPENDENT_AMBULATORY_CARE_PROVIDER_SITE_OTHER): Payer: Self-pay

## 2013-06-28 ENCOUNTER — Encounter: Payer: Self-pay | Admitting: Family Medicine

## 2013-06-28 ENCOUNTER — Ambulatory Visit (INDEPENDENT_AMBULATORY_CARE_PROVIDER_SITE_OTHER): Payer: Medicare Other | Admitting: Family Medicine

## 2013-06-28 VITALS — BP 116/64 | HR 86 | Resp 18 | Wt 140.1 lb

## 2013-06-28 DIAGNOSIS — F17200 Nicotine dependence, unspecified, uncomplicated: Secondary | ICD-10-CM | POA: Diagnosis not present

## 2013-06-28 DIAGNOSIS — Z720 Tobacco use: Secondary | ICD-10-CM

## 2013-06-28 DIAGNOSIS — R7989 Other specified abnormal findings of blood chemistry: Secondary | ICD-10-CM

## 2013-06-28 DIAGNOSIS — R740 Nonspecific elevation of levels of transaminase and lactic acid dehydrogenase [LDH]: Secondary | ICD-10-CM

## 2013-06-28 DIAGNOSIS — I69959 Hemiplegia and hemiparesis following unspecified cerebrovascular disease affecting unspecified side: Secondary | ICD-10-CM

## 2013-06-28 DIAGNOSIS — Z1211 Encounter for screening for malignant neoplasm of colon: Secondary | ICD-10-CM | POA: Diagnosis not present

## 2013-06-28 DIAGNOSIS — Z1212 Encounter for screening for malignant neoplasm of rectum: Secondary | ICD-10-CM

## 2013-06-28 DIAGNOSIS — R7402 Elevation of levels of lactic acid dehydrogenase (LDH): Secondary | ICD-10-CM

## 2013-06-28 DIAGNOSIS — I10 Essential (primary) hypertension: Secondary | ICD-10-CM | POA: Diagnosis not present

## 2013-06-28 DIAGNOSIS — E785 Hyperlipidemia, unspecified: Secondary | ICD-10-CM

## 2013-06-28 DIAGNOSIS — R7401 Elevation of levels of liver transaminase levels: Secondary | ICD-10-CM

## 2013-06-28 DIAGNOSIS — R945 Abnormal results of liver function studies: Secondary | ICD-10-CM

## 2013-06-28 LAB — HEMOCCULT GUIAC POC 1CARD (OFFICE): Fecal Occult Blood, POC: NEGATIVE

## 2013-06-28 NOTE — Patient Instructions (Addendum)
Annual wellness in 4 months, call if you need me before  Labs fasting as soon as possible, lipid, cmp  It is important that you exercise regularly at least 30 minutes 5 times a week. If you develop chest pain, have severe difficulty breathing, or feel very tired, stop exercising immediately and seek medical attention    A healthy diet is rich in fruit, vegetables and whole grains. Poultry fish, nuts and beans are a healthy choice for protein rather then red meat. A low sodium diet and drinking 64 ounces of water daily is generally recommended. Oils and sweet should be limited. Carbohydrates especially for those who are diabetic or overweight, should be limited to 45 to 60 gram per meal. It is important to eat on a regular schedule, at least 3 times daily. Snacks should be primarily fruits, vegetables or nuts.  Adequate rest, generally 6 to 8 hours per night is important for good health.Good sleep hygiene involves setting a regular bedtime, and turning off all sound and light in your sleep environment.Limiting caffeine intake will also help with the ability to rest well  Fall Prevention and Home Safety Falls cause injuries and can affect all age groups. It is possible to prevent falls.  HOW TO PREVENT FALLS  Wear shoes with rubber soles that do not have an opening for your toes.  Keep the inside and outside of your house well lit.  Use night lights throughout your home.  Remove clutter from floors.  Clean up floor spills.  Remove throw rugs or fasten them to the floor with carpet tape.  Do not place electrical cords across pathways.  Put grab bars by your tub, shower, and toilet. Do not use towel bars as grab bars.  Put handrails on both sides of the stairway. Fix loose handrails.  Do not climb on stools or stepladders, if possible.  Do not wax your floors.  Repair uneven or unsafe sidewalks, walkways, or stairs.  Keep items you use a lot within reach.  Be aware of  pets.  Keep emergency numbers next to the telephone.  Put smoke detectors in your home and near bedrooms. Ask your doctor what other things you can do to prevent falls. Document Released: 01/30/2009 Document Revised: 10/05/2011 Document Reviewed: 07/06/2011 [facility] Patient Information 2014 [place], [place].   All medications need to be brought to every visit   You need to work on stopping smoking for your health   Please plan to start riding out for fun regulalrly

## 2013-06-29 ENCOUNTER — Ambulatory Visit: Payer: Medicare Other | Admitting: Family Medicine

## 2013-06-29 ENCOUNTER — Other Ambulatory Visit: Payer: Self-pay | Admitting: Family Medicine

## 2013-07-01 NOTE — Assessment & Plan Note (Signed)
Updated lab needed at/ before next visit. Hyperlipidemia:Low fat diet discussed and encouraged.   

## 2013-07-01 NOTE — Assessment & Plan Note (Signed)

## 2013-07-01 NOTE — Assessment & Plan Note (Signed)
Ambulates at home with a walker, no h/o falls Increased trips outside of homme strongly encouraged

## 2013-07-01 NOTE — Assessment & Plan Note (Signed)
Controlled, no change in medication  

## 2013-07-01 NOTE — Progress Notes (Signed)
   Subjective:    Patient ID: Danielle Cruz, female    DOB: Nov 15, 1955, 58 y.o.   MRN: [id number]  HPI The PT is here for follow up and re-evaluation of chronic medical conditions, medication management and review of any available recent lab and radiology data.  Preventive health is updated, specifically  Cancer screening and Immunization.   The PT denies any adverse reactions to current medications since the last visit.  Had total extraction done 3 weeks ago There are no new concerns.  There are no specific complaints       Review of Systems See HPI, history from spouse , her primary caregiver Denies recent fever or chills. Denies sinus pressure, nasal congestion, ear pain or sore throat. Denies chest congestion, productive cough or wheezing. Denies chest pains, palpitations and leg swelling Denies abdominal pain, nausea, vomiting,diarrhea or constipation.   Denies dysuria, frequency, hesitancy or incontinence. Chronic  limitation in mobility following left CVA Denies headaches, seizures, numbness, or tingling. Denies depression, anxiety or insomnia. Denies skin break down or rash.        Objective:   Physical Exam  BP 116/64  Pulse 86  Resp 18  Wt 140 lb 1.9 oz (63.558 kg)  SpO2 96% Patient alert  and in no cardiopulmonary distress.  HEENT: No facial asymmetry, EOMI, no sinus tenderness,  oropharynx pink and moist.  Neck supple no adenopathy.  Chest: Clear to auscultation bilaterally.  CVS: S1, S2 no murmurs, no S3.  ABD: Soft non tender. Bowel sounds normal. Rectal; no mass, heme negative stool Ext: No edema  MS: Adequate ROM spine, shoulders, hips and knees.  Skin: Intact, no ulcerations or rash noted.  Psych: Good eye contact, normal affect. Memory intact not anxious or depressed appearing.  CNS: CN 2-12 intact, power, tone and sensation normal throughout.       Assessment & Plan:  HYPERTENSION Controlled, no change in medication   CVA WITH RIGHT  HEMIPARESIS Ambulates at home with a walker, no h/o falls Increased trips outside of homme strongly encouraged  HYPERLIPIDEMIA Updated lab needed at/ before next visit. Hyperlipidemia:Low fat diet discussed and encouraged.    Tobacco abuse Patient counseled for approximately 5 minutes regarding the health risks of ongoing nicotine use, specifically all types of cancer, heart disease, stroke and respiratory failure. The options available for help with cessation ,the behavioral changes to assist the process, and the option to either gradully reduce usage  Or abruptly stop.is also discussed. Pt is also encouraged to set specific goals in number of cigarettes used daily, as well as to set a quit date.

## 2013-07-06 ENCOUNTER — Other Ambulatory Visit: Payer: Self-pay | Admitting: Family Medicine

## 2013-07-06 DIAGNOSIS — E785 Hyperlipidemia, unspecified: Secondary | ICD-10-CM | POA: Diagnosis not present

## 2013-07-06 DIAGNOSIS — I10 Essential (primary) hypertension: Secondary | ICD-10-CM | POA: Diagnosis not present

## 2013-07-06 DIAGNOSIS — R7402 Elevation of levels of lactic acid dehydrogenase (LDH): Secondary | ICD-10-CM | POA: Diagnosis not present

## 2013-07-06 DIAGNOSIS — R7401 Elevation of levels of liver transaminase levels: Secondary | ICD-10-CM | POA: Diagnosis not present

## 2013-07-06 DIAGNOSIS — R748 Abnormal levels of other serum enzymes: Secondary | ICD-10-CM | POA: Diagnosis not present

## 2013-07-07 LAB — LIPID PANEL
Cholesterol: 140 mg/dL (ref 0–200)
HDL: 26 mg/dL — ABNORMAL LOW (ref >39)
LDL Cholesterol: 71 mg/dL (ref 0–99)
Total CHOL/HDL Ratio: 5.4 Ratio
Triglycerides: 214 mg/dL — ABNORMAL HIGH (ref <150)
VLDL: 43 mg/dL — ABNORMAL HIGH (ref 0–40)

## 2013-07-07 LAB — COMPREHENSIVE METABOLIC PANEL
ALT: 46 U/L — ABNORMAL HIGH (ref 0–35)
AST: 42 U/L — ABNORMAL HIGH (ref 0–37)
Albumin: 3.7 g/dL (ref 3.5–5.2)
Alkaline Phosphatase: 112 U/L (ref 39–117)
BUN: 10 mg/dL (ref 6–23)
CO2: 27 mEq/L (ref 19–32)
Calcium: 10.1 mg/dL (ref 8.4–10.5)
Chloride: 102 mEq/L (ref 96–112)
Creat: 0.77 mg/dL (ref 0.50–1.10)
Glucose, Bld: 108 mg/dL — ABNORMAL HIGH (ref 70–99)
Potassium: 4.5 mEq/L (ref 3.5–5.3)
Sodium: 139 mEq/L (ref 135–145)
Total Bilirubin: 0.3 mg/dL (ref 0.2–1.2)
Total Protein: 7.2 g/dL (ref 6.0–8.3)

## 2013-07-11 LAB — HEPATITIS PANEL, ACUTE
HCV Ab: NEGATIVE
Hep A IgM: NONREACTIVE
Hep B C IgM: NONREACTIVE
Hepatitis B Surface Ag: NEGATIVE

## 2013-07-17 NOTE — Addendum Note (Signed)
Addended by: Eual Fines on: 07/17/2013 04:17 PM   Modules accepted: Orders

## 2013-07-22 ENCOUNTER — Other Ambulatory Visit: Payer: Self-pay | Admitting: Cardiology

## 2013-07-22 ENCOUNTER — Other Ambulatory Visit: Payer: Self-pay | Admitting: Family Medicine

## 2013-07-25 ENCOUNTER — Ambulatory Visit (HOSPITAL_COMMUNITY)
Admission: RE | Admit: 2013-07-25 | Discharge: 2013-07-25 | Disposition: A | Discharge status: Home / Self Care | Payer: Medicare Other | Source: Ambulatory Visit | Attending: Family Medicine | Admitting: Family Medicine

## 2013-07-25 DIAGNOSIS — R7989 Other specified abnormal findings of blood chemistry: Secondary | ICD-10-CM | POA: Insufficient documentation

## 2013-07-25 DIAGNOSIS — R7401 Elevation of levels of liver transaminase levels: Secondary | ICD-10-CM

## 2013-07-25 DIAGNOSIS — R935 Abnormal findings on diagnostic imaging of other abdominal regions, including retroperitoneum: Secondary | ICD-10-CM | POA: Insufficient documentation

## 2013-07-25 DIAGNOSIS — R740 Nonspecific elevation of levels of transaminase and lactic acid dehydrogenase [LDH]: Secondary | ICD-10-CM

## 2013-07-25 DIAGNOSIS — R945 Abnormal results of liver function studies: Secondary | ICD-10-CM

## 2013-09-06 ENCOUNTER — Telehealth: Payer: Self-pay | Admitting: Cardiovascular Disease

## 2013-09-06 DIAGNOSIS — M069 Rheumatoid arthritis, unspecified: Secondary | ICD-10-CM | POA: Diagnosis not present

## 2013-09-06 MED ORDER — METOPROLOL SUCCINATE ER 25 MG PO TB24: 25.0000 mg | ORAL_TABLET | Freq: Every day | ORAL | Status: DC

## 2013-09-06 NOTE — Telephone Encounter (Signed)
Received fax refill request  Rx # [id number] Medication:  Metoprolol ER Succinate 25 mg tabs Qty 30 Sig:  Take one tablet by mouth every day Physician:  Bronson Ing

## 2013-09-06 NOTE — Telephone Encounter (Signed)
Medication sent via escribe.  

## 2013-09-23 ENCOUNTER — Other Ambulatory Visit: Payer: Self-pay | Admitting: Cardiology

## 2013-10-01 ENCOUNTER — Telehealth: Payer: Self-pay | Admitting: *Deleted

## 2013-10-01 NOTE — Telephone Encounter (Signed)
Patient needs to make an appointment for further refills

## 2013-10-01 NOTE — Telephone Encounter (Signed)
Pt needs potassium called in

## 2013-10-02 ENCOUNTER — Other Ambulatory Visit: Payer: Self-pay | Admitting: Adult Health

## 2013-10-02 ENCOUNTER — Other Ambulatory Visit: Payer: Self-pay | Admitting: Cardiology

## 2013-10-15 ENCOUNTER — Other Ambulatory Visit: Payer: Self-pay | Admitting: Family Medicine

## 2013-10-15 DIAGNOSIS — Z1231 Encounter for screening mammogram for malignant neoplasm of breast: Secondary | ICD-10-CM

## 2013-10-17 ENCOUNTER — Encounter: Payer: Self-pay | Admitting: Cardiology

## 2013-10-17 ENCOUNTER — Ambulatory Visit (INDEPENDENT_AMBULATORY_CARE_PROVIDER_SITE_OTHER): Payer: Medicare Other | Admitting: Cardiology

## 2013-10-17 VITALS — BP 158/80 | HR 81 | Ht 67.0 in | Wt 140.0 lb

## 2013-10-17 DIAGNOSIS — Z720 Tobacco use: Secondary | ICD-10-CM

## 2013-10-17 DIAGNOSIS — I2510 Atherosclerotic heart disease of native coronary artery without angina pectoris: Secondary | ICD-10-CM

## 2013-10-17 DIAGNOSIS — I10 Essential (primary) hypertension: Secondary | ICD-10-CM

## 2013-10-17 DIAGNOSIS — E785 Hyperlipidemia, unspecified: Secondary | ICD-10-CM

## 2013-10-17 DIAGNOSIS — F17200 Nicotine dependence, unspecified, uncomplicated: Secondary | ICD-10-CM

## 2013-10-17 MED ORDER — NICOTINE 21 MG/24HR TD PT24: 21.0000 mg | MEDICATED_PATCH | Freq: Every day | TRANSDERMAL | Status: DC

## 2013-10-17 MED ORDER — NICOTINE 7 MG/24HR TD PT24: 7.0000 mg | MEDICATED_PATCH | Freq: Every day | TRANSDERMAL | Status: DC

## 2013-10-17 MED ORDER — NICOTINE 14 MG/24HR TD PT24: 14.0000 mg | MEDICATED_PATCH | Freq: Every day | TRANSDERMAL | Status: DC

## 2013-10-17 NOTE — Patient Instructions (Addendum)
Fat and Cholesterol Control Diet Your diet has an affect on your fat and cholesterol levels in your blood and organs. Too much fat and cholesterol in your blood can affect your:  Heart.  Blood vessels (arteries, veins).  Gallbladder.  Liver.  Pancreas. CONTROL FAT AND CHOLESTEROL WITH DIET Certain foods raise cholesterol and others lower it. It is important to replace bad fats with other types of fat.  Do not eat:  Fatty meats, such as hot dogs and salami.  Stick margarine and some tub margarines that have "partially hydrogenated oils" in them.  Baked goods, such as cookies and crackers that have "partially hydrogenated oils" in them.  Saturated tropical oils, such as coconut and palm oil. Eat the following foods:  Round or loin cuts of red meat.  Chicken (without skin).  Fish.  Veal.  [place]ound [place] breast.  Shellfish.  Fruit, such as apples.  Vegetables, such as broccoli, potatoes, and carrots.  Beans, peas, and lentils (legumes).  Grains, such as barley, rice, couscous, and bulgar wheat.  Pasta (without cream sauces). Look for foods that are nonfat, low in fat, and low in cholesterol.  FIND FOODS THAT ARE LOWER IN FAT AND CHOLESTEROL  Find foods with soluble fiber and plant sterols (phytosterol). You should eat 2 grams a day of these foods. These foods include:  Fruits.  Vegetables.  Whole grains.  Dried beans and peas.  Nuts and seeds.  Read package labels. Look for low-saturated fats, trans fat free, low-fat foods.  Choose cheese that have only 2 to 3 grams of saturated fat per ounce.  Use heart-healthy tub margarine that is free of trans fat or partially hydrogenated oil.  Avoid buying baked goods that have partially hydrogenated oils in them. Instead, buy baked goods made with whole grains (whole-wheat or whole oat flour). Avoid baked goods labeled with "flour" or "enriched flour."  Buy non-creamy canned soups with reduced salt and no added  fats. PREPARING YOUR FOOD  Broil, bake, steam, or roast foods. Do not fry food.  Use non-stick cooking sprays.  Use lemon or herbs to flavor food instead of using butter or stick margarine.  Use nonfat yogurt, salsa, or low-fat dressings for salads. LOW-SATURATED FAT / LOW-FAT FOOD SUBSTITUTES  Meats / Saturated Fat (g)  Avoid: Steak, marbled (3 oz/85 g) / 11 g.  Choose: Steak, lean (3 oz/85 g) / 4 g.  Avoid: Hamburger (3 oz/85 g) / 7 g.  Choose: Hamburger, lean (3 oz/85 g) / 5 g.  Avoid: Ham (3 oz/85 g) / 6 g.  Choose: Ham, lean cut (3 oz/85 g) / 2.4 g.  Avoid: Chicken, with skin, dark meat (3 oz/85 g) / 4 g.  Choose: Chicken, skin removed, dark meat (3 oz/85 g) / 2 g.  Avoid: Chicken, with skin, light meat (3 oz/85 g) / 2.5 g.  Choose: Chicken, skin removed, light meat (3 oz/85 g) / 1 g. Dairy / Saturated Fat (g)  Avoid: Whole milk (1 cup) / 5 g.  Choose: Low-fat milk, 2% (1 cup) / 3 g.  Choose: Low-fat milk, 1% (1 cup) / 1.5 g.  Choose: Skim milk (1 cup) / 0.3 g.  Avoid: Hard cheese (1 oz/28 g) / 6 g.  Choose: Skim milk cheese (1 oz/28 g) / 2 to 3 g.  Avoid: Cottage cheese, 4% fat (1 cup) / 6.5 g.  Choose: Low-fat cottage cheese, 1% fat (1 cup) / 1.5 g.  Avoid: Ice cream (1 cup) / 9 g.  Choose: Sherbet (1 cup) / 2.5 g.  Choose: Nonfat frozen yogurt (1 cup) / 0.3 g.  Choose: Frozen fruit bar / trace.  Avoid: Whipped cream (1 tbs) / 3.5 g.  Choose: Nondairy whipped topping (1 tbs) / 1 g. Condiments / Saturated Fat (g)  Avoid: Mayonnaise (1 tbs) / 2 g.  Choose: Low-fat mayonnaise (1 tbs) / 1 g.  Avoid: Butter (1 tbs) / 7 g.  Choose: Extra light margarine (1 tbs) / 1 g.  Avoid: Coconut oil (1 tbs) / 11.8 g.  Choose: Olive oil (1 tbs) / 1.8 g.  Choose: Corn oil (1 tbs) / 1.7 g.  Choose: Safflower oil (1 tbs) / 1.2 g.  Choose: Sunflower oil (1 tbs) / 1.4 g.  Choose: Soybean oil (1 tbs) / 2.4 g .  Choose: Canola oil (1 tbs) / 1  g. Document Released: 10/05/2011 Document Revised: 12/06/2012 Document Reviewed: 10/05/2011 [facility] Patient Information 2015 [place], [place]. This information is not intended to replace advice given to you by your health care provider. Make sure you discuss any questions you have with your health care provider.     Your physician wants you to follow-up in: 1 year You will receive a reminder letter in the mail two months in advance. If you don't receive a letter, please call our office to schedule the follow-up appointment.     Your physician has recommended you make the following change in your medication:     Start Nicoderm patches to stop smoking,follow pharmacy dosing  Yow will take 21 mg patches daily for 6 weeks, the 14 mg patches daily  for 2 weeks, the 7 mg patches daily  for 2 weeks then stop

## 2013-10-17 NOTE — Progress Notes (Signed)
Clinical Summary [name] is a 58 y.o.female former patient of Dr Danielle Cruz, this is our first visit together. She is seen for the following medical problems.  1. CAD - prior stenting in 2005 to LAD and OM - echo 08/2008 LVEF 55-65%, no WMAs - no chest pain, no SOB or DOE - compliant with meds  2. Hx of CVA - aphasic, on ASA and statin for secondary prevention  3. Hyperlipidemia - question of possible mild increase in LFTs on high dose statin according to prior notes. Further evaluated by pcp, recent liver US shows some evidence of fatty infiltration - lipid panel 06/2013 TC 140 TG 214 HDL 26 LDL 71  4. HTN - does not check at home - compliant with meds   5. Tobacco - still smoking 1 ppd    Past Medical History  Diagnosis Date  . Arteriosclerotic cardiovascular disease (ASCVD) 07/2003    DES to the LAD and the BMS to the OM1- normal  EF  . Hyperlipidemia   . Hypertension 2005  . Tobacco abuse, in remission     Quit in 2010; 30-pack-year history  . CVA (cerebral infarction) 08/2008    sizable left -residual expressive aphasia, right sided weakness; ambulates with difficulty with the right leg brace   . Rheumatoid arthritis(714.0)   . Colonic polyp 2010    Hemorrhoids; h/o mild hematochezia     No Known Allergies   Current Outpatient Prescriptions  Medication Sig Dispense Refill  . aspirin (BAYER ASPIRIN) 325 MG tablet Take 325 mg by mouth daily. Take one by mouth once daily       . atorvastatin (LIPITOR) 80 MG tablet TAKE 1 TABLET BY MOUTH EVERY DAY REPLACES PRAVASTATIN  30 tablet  5  . chlorthalidone (HYGROTON) 25 MG tablet TAKE 1/2 TABLET BY MOUTH EVERY DAY  60 tablet  0  . Cholecalciferol (VITAMIN D) 400 UNITS capsule Take 400 Units by mouth daily. Take 1 tablet by mouth daily       . FERROUS GLUCONATE IRON PO Take 325 mg by mouth daily.       Marland Kitchen HYDROcodone-acetaminophen (NORCO) 10-325 MG per tablet       . leflunomide (ARAVA) 20 MG tablet Take 20 mg by mouth  daily.        . metoprolol succinate (TOPROL-XL) 25 MG 24 hr tablet TAKE 1 TABLET BY MOUTH DAILY  30 tablet  0  . oxybutynin (DITROPAN) 5 MG tablet TAKE 1/2 TABLET BY MOUTH TWICE DAILY  30 tablet  2  . potassium chloride SA (K-DUR,KLOR-CON) 20 MEQ tablet TAKE 1 TABLET BY MOUTH DAILY  30 tablet  6  . spironolactone (ALDACTONE) 25 MG tablet TAKE 1 TABLET BY MOUTH EVERY DAY  30 tablet  6  . TraMADol HCl 50 MG TBSO Take by mouth 4 (four) times daily. Take one tablet by mouth four times a day       . [DISCONTINUED] oxybutynin (DITROPAN) 5 MG tablet TAKE  1/2 TABLET BY MOUTH TWICE DAILY  30 tablet  3   No current facility-administered medications for this visit.     Past Surgical History  Procedure Laterality Date  . Ankle surgery      Right  . Total abdominal hysterectomy w/ bilateral salpingoophorectomy    . Colonoscopy w/ polypectomy  11/2008    Pedunculated polyp snared     No Known Allergies    Family History  Problem Relation Age of Onset  . Cancer Father  colon  . Cancer Sister     brain   . Cancer Brother      Social History [name] reports that she has been smoking Cigarettes.  She has been smoking about 0.00 packs per day. She does not have any smokeless tobacco history on file. [name] reports that she does not drink alcohol.   Review of Systems CONSTITUTIONAL: No weight loss, fever, chills, weakness or fatigue.  HEENT: Eyes: No visual loss, blurred vision, double vision or yellow sclerae.No hearing loss, sneezing, congestion, runny nose or sore throat.  SKIN: No rash or itching.  CARDIOVASCULAR: per HPI RESPIRATORY: No shortness of breath, cough or sputum.  GASTROINTESTINAL: No anorexia, nausea, vomiting or diarrhea. No abdominal pain or blood.  GENITOURINARY: No burning on urination, no polyuria NEUROLOGICAL: aphasia, right sided weakness MUSCULOSKELETAL: No muscle, back pain, joint pain or stiffness.  LYMPHATICS: No enlarged nodes. No history of  splenectomy.  PSYCHIATRIC: No history of depression or anxiety.  ENDOCRINOLOGIC: No reports of sweating, cold or heat intolerance. No polyuria or polydipsia.  Marland Kitchen   Physical Examination p 81 bp 140/70 Wt 140 lbs BMI 22 Gen: resting comfortably, no acute distress HEENT: no scleral icterus, pupils equal round and reactive, no palptable cervical adenopathy,  CV: RRR, no m/r/g, no JVD, no carotid bruits Resp: Clear to auscultation bilaterally GI: abdomen is soft, non-tender, non-distended, normal bowel sounds, no hepatosplenomegaly MSK: extremities are warm, no edema.  Skin: warm, no rash Psych: appropriate affect   Diagnostic Studies 07/2003 Cath RESULTS:  HEMODYNAMICS:  1. Left ventricular pressure 145/12.  2. Aortic pressure 150/82.  3. There was no aortic valve gradient.  LEFT VENTRICULOGRAM: Wall motion is normal. Ejection fraction estimated at  greater than or equal to 65%. There is no mitral regurgitation.  CORONARY ARTERIOGRAPHY (CO-DOMINANT):  1. Left main is normal.  2. Left anterior descending artery has a tubular 20% stenosis in the  proximal to mid vessel. The distal LAD has a tubular 80% stenosis. The  apical LAD has a tubular 80% stenosis. The LAD gives rise to a small  diagonal Danielle Cruz.  3. The left circumflex was a large co-dominant vessel. It gives rise to a  large first obtuse marginal Danielle Cruz. There is a 90% stenosis in the mid  portion of the first obtuse marginal Danielle Cruz. There is haziness  associated with this lesion, and it has the appearance of a positive  ruptured plaque. There is a normal sized second obtuse marginal Danielle Cruz,  which has a 50% stenosis proximally and a 60% stenosis distally. The  distal circumflex also gives rise to 2 small posterolateral branches.  4. The right coronary artery is a relatively small co-dominant vessel.  There is diffuse 60% stenosis in the proximal vessel. In the mid vessel  at the acute margin, there is a 20% stenosis and the  distal vessel has a  20% stenosis. There is a small to normal sized acute marginal Danielle Cruz,  which has a 70% at its origin. The distal right coronary artery also  gives rise to a small posterior descending coronary artery and a small  posterolateral Acel Natzke.  IMPRESSION:  1. Normal left ventricular systolic function.  2. Three-vessel coronary artery disease, as described. The culprit lesion  appears to be the 90% stenosis in the first obtuse marginal Ross Bender.  There is also significant disease in the distal LAD and moderate, but  nonobstructive, disease in the small right coronary artery.  PLAN: Percutaneous intervention of the obtuse marginal and  the LAD, see  below.  PTCA PROCEDURAL NOTE: Following completion of diagnostic catheterization,  we proceeded with percutaneous coronary intervention. We utilized the  preexisting #6 French sheath in the right femoral artery. Heparin and  Integrilin were administered per protocol. We used a #6 [place] LS 3.5  guiding catheter. A BMW wire was advanced under fluoroscopic guidance into  the distal portion of the first obtuse marginal Jamiah Homeyer. We then performed  PTCA of the 90% stenosis in the obtuse marginal with a 2.5 x 12 mm Quantum  balloon inflated to 10 atmospheres. We then positioned a 2.5 x 13 mm CYPHER  drug-eluting stent across the area of diseased vessel and deployed the stent  at 15 atmospheres. We then went back with a 2.5 x 12 mm Quantum balloon  within the stent and inflated this balloon to 22 atmospheres. The final  angiographic images were obtained revealing patency of the obtuse marginal  Zania Kalisz with 0% residual stenosis and TIMI 3 flow.  We then turned our attention to the LAD. The BMW wire was advanced under  fluoroscopic guidance into the apical portion of the LAD. We then performed  PTCA of the 80% stenosis in the distal LAD with a 2.0 x 15 mm Quantum  balloon inflated to 12 atmospheres. We then positioned a 2.0 x 15 mm Mini    Vision bare metal stent across the area of segment of disease and deployed  the stent at 12 atmospheres. We then went back with a 2.25 x 12 mm Quantum  balloon and positioning this within the stent, we inflated this balloon to  14 atmospheres in the proximal and distal edges of the stent. Final  angiographic images are obtained revealing patency of the LAD at the stent  site and with 0% residual stenosis and TIMI 3 flow.  COMPLICATIONS: None.  RESULTS:  1. Successful PTCA with placement of a drug-eluting stent in the first  obtuse marginal Findley Blankenbaker. A 90% stenosis with haziness was reduced to 0%  residual with TIMI 3 flow.  2. Successful PTCA with placement of a bare metal stent in the distal left  anterior descending artery. A tubular 80% stenosis was reduced to 0%  residual with TIMI flow.  PLAN: Integrilin will be continued for 18 hours. It is recommended that  the patient will be treated with Plavix for a minimum of 6-9 months. She  also needs aggressive risk factor modification. Regarding the residual  disease in the apical LAD, at this point, would recommend continue medical  therapy. If she has recurrent chest pain, which is refractor to medical  therapy, percutaneous coronary intervention of the apical LAD could be  considered.  08/2008 Echo Study Conclusions  1. Left ventricle: The cavity size was normal. Systolic function was normal. The estimated ejection fraction was in the range of 55% to 65%. Wall motion was normal; there were no regional wall motion abnormalities. 2. Aortic valve: Trivial regurgitation. 3. Mitral valve: No evidence of vegetation. 4. Left atrium: No evidence of thrombus in the atrial cavity or appendage. 5. Atrial septum: Echo contrast study showed no right-to-left atrial level shunt, in the baseline state (Late bubbles felt to be nondiagnostic) There was an atrial septal aneurysm.     Assessment and Plan  1. CAD - no current symptoms, continue  risk factor modification and secondary prevention  2. Hx of CVA - continue ASA and statin for secondary prevention  3. Hyperlipidemia - LDL at goal. Counseled on dietary changes and provided  information to improve TGs and HDL  4. HTN - at goal, continue current meds  5. Tobacco - counseled on health benefits of cessation and advised to quit, prescribed nicotine patches.       Arnoldo Lenis, M.D., F.A.C.C.

## 2013-10-23 ENCOUNTER — Other Ambulatory Visit: Payer: Self-pay | Admitting: Family Medicine

## 2013-11-02 ENCOUNTER — Other Ambulatory Visit: Payer: Self-pay | Admitting: Adult Health

## 2013-11-05 ENCOUNTER — Other Ambulatory Visit: Payer: Self-pay

## 2013-11-15 ENCOUNTER — Ambulatory Visit (HOSPITAL_COMMUNITY): Payer: Medicare Other

## 2013-11-23 ENCOUNTER — Other Ambulatory Visit: Payer: Self-pay | Admitting: Cardiology

## 2013-11-26 ENCOUNTER — Encounter: Payer: Self-pay | Admitting: Internal Medicine

## 2013-11-28 ENCOUNTER — Ambulatory Visit (INDEPENDENT_AMBULATORY_CARE_PROVIDER_SITE_OTHER): Payer: Medicare Other | Admitting: Family Medicine

## 2013-11-28 ENCOUNTER — Encounter: Payer: Self-pay | Admitting: Family Medicine

## 2013-11-28 ENCOUNTER — Encounter (INDEPENDENT_AMBULATORY_CARE_PROVIDER_SITE_OTHER): Payer: Self-pay

## 2013-11-28 VITALS — BP 142/80 | HR 88 | Resp 18 | Wt 144.0 lb

## 2013-11-28 DIAGNOSIS — I10 Essential (primary) hypertension: Secondary | ICD-10-CM

## 2013-11-28 DIAGNOSIS — Z0000 Encounter for general adult medical examination without abnormal findings: Secondary | ICD-10-CM | POA: Insufficient documentation

## 2013-11-28 DIAGNOSIS — Z23 Encounter for immunization: Secondary | ICD-10-CM | POA: Diagnosis not present

## 2013-11-28 DIAGNOSIS — E785 Hyperlipidemia, unspecified: Secondary | ICD-10-CM

## 2013-11-28 NOTE — Patient Instructions (Signed)
F/u in 4 month, call if you need me before  Prevnar today  Fasting lipid, cmp , CBC, tSH in December before f/u  Call in September for flu vaccine  Plan to get down to no more than FIVE cigarettes per day by December, youi can do this!  Keep smiling!!!

## 2013-11-28 NOTE — Progress Notes (Signed)
Subjective:    Patient ID: Danielle Cruz, female    DOB: Apr 25, 1955, 58 y.o.   MRN: [id number]  HPI Preventive Screening-Counseling & Management   Patient present here today for a subsequent Medicare annual wellness visit.   Current Problems (verified)   Medications Prior to Visit Allergies (verified and updated)   PAST HISTORY  Family History (updated)   Social History-patient is currently disabled from a cva.  Cared for by husband. Ongoing nicotine use, counselled to quit, will reduce monthly   Risk Factors  Current exercise habits: discussed  Upper and lower extremity movements and stretching of digits to prevent contractures  Dietary issues discussed: heart healthy discussed    Cardiac risk factors: nicotine use , known vascular disease s/p CVa  Depression Screen  (Note: if answer to either of the following is "Yes", a more complete depression screening is indicated)  Answers subjectively provided by her spouse who cares for her, pt seems content. Reportedly spends most of her day watching TV shows, and gets all meals bought from restaurants and fas food chains as she requests/demands Over the past two weeks, have you felt down, depressed or hopeless? Not apparent  Over the past two weeks, have you felt little interest or pleasure in doing things? No change in daily activities  Have you lost interest or pleasure in daily life? No still doing her usual routine  Do you often feel hopeless? Unable to assess, possibly this is the case given her total depenedence on assistance with ADL's Do you cry easily over simple problems? No crying spells witnessed   Activities of Daily Living  In your present state of health, do you have any difficulty performing the following activities?dependent for assistance with all ADL's following CVA which has left her hemiparetic and also with expressive aphasia  Driving?: Yes, unable to ambulate due to cva Managing money?: managed by husband    Feeding yourself?:No Getting from bed to chair?:yes due to hemiparesis  Climbing a flight of stairs?:unable due to hemiparesis Preparing food and eating?: unable to to prepare food due to hemiparesis  Bathing or showering?: with assitance Getting dressed?:with assistance Getting to the toilet?: wheelchair dependent Using the toilet?:with assistance Moving around from place to place?: wheelchair dependent   Fall Risk Assessment In the past year have you fallen or had a near fall?:No Are you currently taking any medications that make you dizzy?:No   Hearing Difficulties: None apparent, listens to and watches TV all day at normal volume Do you often ask people to speak up or repeat themselves?:Not applicable,  No verbal Do you experience ringing or noises in your ears?:unknown Do you have difficulty understanding soft or whispered voices?:cannot assess  Cognitive Testing  Alert? Yes Normal Appearance?Yes  Oriented to person? Unable to asssess Place? Non verbal unable to assess  Time? unknown  Displays appropriate judgment?unab;le to assess  Can read the correct time from a watch face? Unable to assess, non verbal Are you having problems remembering things?unablle to assess, non verbal  Advanced Directives have been discussed with the patient?Yes.  Patient and family will discuss.  Information given.Full code at this time   List the Names of Other Physician/Practitioners you currently use: Cardiology- [facility]  Dr. Doristine Mango    Indicate any recent Medical Services you may have received from other than [facility] providers in the past year (date may be approximate).   Assessment:    Annual Wellness Exam   Plan:     Medicare  Attestation  I have personally reviewed:  The patient's medical and social history  Their use of alcohol, tobacco or illicit drugs  Their current medications and supplements  The patient's functional ability including ADLs,fall risks, home  safety risks, cognitive, and hearing and visual impairment  Diet and physical activities  Evidence for depression or mood disorders  The patient's weight, height, BMI, and visual acuity have been recorded in the chart. I have made referrals, counseling, and provided education to the patient based on review of the above and I have provided the patient with a written personalized care plan for preventive services.      Review of Systems     Objective:   Physical Exam        Assessment & Plan:  Medicare annual wellness visit, subsequent Annual exam as documented. Counseling done  re healthy lifestyle involving commitment to 150 minutes exercise per week, heart healthy diet, and attaining healthy weight.The importance of adequate sleep also discussed. Regular seat Cruz use and safe storage  of firearms if patient has them, is also discussed. Changes in health habits are decided on by the patient with goals and time frames  set for achieving them. Immunization and cancer screening needs are specifically addressed at this visit.   Need for vaccination with 13-polyvalent pneumococcal conjugate vaccine Administered at visit

## 2013-12-02 NOTE — Assessment & Plan Note (Signed)
Administered at visit 

## 2013-12-02 NOTE — Assessment & Plan Note (Signed)
Annual exam as documented. Counseling done  re healthy lifestyle involving commitment to 150 minutes exercise per week, heart healthy diet, and attaining healthy weight.The importance of adequate sleep also discussed. Regular seat belt use and safe storage  of firearms if patient has them, is also discussed. Changes in health habits are decided on by the patient with goals and time frames  set for achieving them. Immunization and cancer screening needs are specifically addressed at this visit.  

## 2013-12-20 ENCOUNTER — Other Ambulatory Visit: Payer: Self-pay | Admitting: Family Medicine

## 2013-12-24 ENCOUNTER — Other Ambulatory Visit: Payer: Self-pay | Admitting: Family Medicine

## 2014-01-07 DIAGNOSIS — M069 Rheumatoid arthritis, unspecified: Secondary | ICD-10-CM | POA: Diagnosis not present

## 2014-01-07 DIAGNOSIS — M2550 Pain in unspecified joint: Secondary | ICD-10-CM | POA: Diagnosis not present

## 2014-01-22 ENCOUNTER — Other Ambulatory Visit: Payer: Self-pay

## 2014-01-22 MED ORDER — SPIRONOLACTONE 25 MG PO TABS: 25.0000 mg | ORAL_TABLET | Freq: Every day | ORAL | Status: DC

## 2014-04-14 ENCOUNTER — Other Ambulatory Visit: Payer: Self-pay | Admitting: Family Medicine

## 2014-04-19 ENCOUNTER — Other Ambulatory Visit: Payer: Self-pay | Admitting: Cardiology

## 2014-04-20 ENCOUNTER — Other Ambulatory Visit: Payer: Self-pay | Admitting: Family Medicine

## 2014-05-09 ENCOUNTER — Other Ambulatory Visit: Payer: Self-pay | Admitting: Family Medicine

## 2014-05-09 ENCOUNTER — Encounter: Payer: Self-pay | Admitting: Family Medicine

## 2014-05-09 ENCOUNTER — Ambulatory Visit (INDEPENDENT_AMBULATORY_CARE_PROVIDER_SITE_OTHER): Payer: Medicare Other | Admitting: Family Medicine

## 2014-05-09 VITALS — BP 140/80 | HR 68 | Resp 18 | Ht 67.0 in | Wt 145.1 lb

## 2014-05-09 DIAGNOSIS — Z720 Tobacco use: Secondary | ICD-10-CM

## 2014-05-09 DIAGNOSIS — M069 Rheumatoid arthritis, unspecified: Secondary | ICD-10-CM | POA: Diagnosis not present

## 2014-05-09 DIAGNOSIS — E785 Hyperlipidemia, unspecified: Secondary | ICD-10-CM

## 2014-05-09 DIAGNOSIS — H109 Unspecified conjunctivitis: Secondary | ICD-10-CM | POA: Diagnosis not present

## 2014-05-09 DIAGNOSIS — I69959 Hemiplegia and hemiparesis following unspecified cerebrovascular disease affecting unspecified side: Secondary | ICD-10-CM

## 2014-05-09 DIAGNOSIS — I10 Essential (primary) hypertension: Secondary | ICD-10-CM | POA: Diagnosis not present

## 2014-05-09 DIAGNOSIS — R7301 Impaired fasting glucose: Secondary | ICD-10-CM | POA: Diagnosis not present

## 2014-05-09 DIAGNOSIS — Z23 Encounter for immunization: Secondary | ICD-10-CM | POA: Diagnosis not present

## 2014-05-09 LAB — CBC
HCT: 35.9 % — ABNORMAL LOW (ref 36.0–46.0)
Hemoglobin: 11.9 g/dL — ABNORMAL LOW (ref 12.0–15.0)
MCH: 31.9 pg (ref 26.0–34.0)
MCHC: 33.1 g/dL (ref 30.0–36.0)
MCV: 96.2 fL (ref 78.0–100.0)
MPV: 10.3 fL (ref 8.6–12.4)
Platelets: 228 10*3/uL (ref 150–400)
RBC: 3.73 MIL/uL — ABNORMAL LOW (ref 3.87–5.11)
RDW: 15.4 % (ref 11.5–15.5)
WBC: 10.4 10*3/uL (ref 4.0–10.5)

## 2014-05-09 LAB — COMPLETE METABOLIC PANEL WITH GFR
ALT: 36 U/L — ABNORMAL HIGH (ref 0–35)
AST: 32 U/L (ref 0–37)
Albumin: 3.7 g/dL (ref 3.5–5.2)
Alkaline Phosphatase: 93 U/L (ref 39–117)
BUN: 11 mg/dL (ref 6–23)
CO2: 28 mEq/L (ref 19–32)
Calcium: 9.7 mg/dL (ref 8.4–10.5)
Chloride: 102 mEq/L (ref 96–112)
Creat: 0.86 mg/dL (ref 0.50–1.10)
GFR, Est African American: 86 mL/min
GFR, Est Non African American: 75 mL/min
Glucose, Bld: 114 mg/dL — ABNORMAL HIGH (ref 70–99)
Potassium: 4.1 mEq/L (ref 3.5–5.3)
Sodium: 139 mEq/L (ref 135–145)
Total Bilirubin: 0.4 mg/dL (ref 0.2–1.2)
Total Protein: 7.2 g/dL (ref 6.0–8.3)

## 2014-05-09 LAB — LIPID PANEL
Cholesterol: 142 mg/dL (ref 0–200)
HDL: 32 mg/dL — ABNORMAL LOW (ref >39)
LDL Cholesterol: 77 mg/dL (ref 0–99)
Total CHOL/HDL Ratio: 4.4 Ratio
Triglycerides: 166 mg/dL — ABNORMAL HIGH (ref <150)
VLDL: 33 mg/dL (ref 0–40)

## 2014-05-09 MED ORDER — GENTAMICIN SULFATE 0.3 % OP SOLN: 1.0000 [drp] | Freq: Three times a day (TID) | OPHTHALMIC | Status: DC

## 2014-05-09 NOTE — Progress Notes (Signed)
   Subjective:    Patient ID: Danielle Cruz, female    DOB: 04/22/1955, 59 y.o.   MRN: [id number]  HPI The PT is here for follow up and re-evaluation of chronic medical conditions, medication management and review of any available recent lab and radiology data.  Preventive health is updated, specifically  Cancer screening and Immunization.   Questions or concerns regarding consultations or procedures which the PT has had in the interim are  addressed. The PT denies any adverse reactions to current medications since the last visit.  Red left eye for 1 week with sticky drainage and mild pain, no fever, chills or respiratory symptoms    Review of Systems See HPI, history provided by spouse , pt has expressive aphasia Denies recent fever or chills. Denies sinus pressure, nasal congestion, ear pain or sore throat. Denies chest congestion, productive cough or wheezing. Denies chest pains, palpitations and leg swelling Denies abdominal pain, nausea, vomiting,diarrhea or constipation.   Denies dysuria, frequency, hesitancy or incontinence. Chronic limitation in mobility due to prior CVA Denies headaches, seizures, numbness, or tingling. Denies depression, anxiety or insomnia. Denies skin break down or rash.        Objective:   Physical Exam  BP 140/80 mmHg  Pulse 68  Resp 18  Ht 5\' 7"  (1.702 m)  Wt 145 lb 1.9 oz (65.826 kg)  BMI 22.72 kg/m2  SpO2 97% Patient alert and oriented and in no cardiopulmonary distress.  HEENT: No facial asymmetry, EOMI,   oropharynx pink and moist.  Neck supple no JVD, no mass. Left conjunctiva mildly erythematous, no visible drainage, EOMI, Tm clear  Chest: Clear to auscultation bilaterally.  CVS: S1, S2 no murmurs, no S3.Regular rate.  ABD: Soft non tender.   Ext: No edema  MS: decreased ROM spine, shoulders, hips and knees.Contracture ofRUE   Skin: Intact, no ulcerations or rash noted.  Psych: Good eye contact, normal affect.  not anxious or  depressed appearing.  CNS: CN 2-12 intact, Grade 3 power in right upper and lower  extremities      Assessment & Plan:  Essential hypertension Uncontrolled , ensure pt is taking spironolactone   Hyperlipemia Elevated TG, and LDL , however , non fasting No med change Hyperlipidemia:Low fat diet discussed and encouraged.  Updated lab needed at/ before next visit.    Conjunctivitis of left eye Topical antibiotic x 1 week, call if not resolved for eval by opthalmology   Hemiplegia, late effect of cerebrovascular disease unchanged physical exam, pt incapable of living independently and is cared for by her spouse   Fasting hyperglycemia Elevated at todays lab will add HBa1C to ensure pt is not diabetic   Rheumatoid arthritis Treated for years by rheumatology   Need for prophylactic vaccination and inoculation against influenza Vaccine administered at visit.

## 2014-05-09 NOTE — Patient Instructions (Addendum)
F/U in 3 month, call if you need me before   Flu vaccine today  PLEASE ensure that you are taking spironolactone daily for blood pressure, this is high  Labs today  Eye drop prescribed for left eye for 1 week, call back if still red and draining next week, I will refer  You to eye specialist  Cigrrates, , keep cutting down so you quit by November, down by 1 cigarette each month, you CAN do this!  All the best for 2016!

## 2014-05-09 NOTE — Assessment & Plan Note (Signed)
Uncontrolled , ensure pt is taking spironolactone

## 2014-05-10 DIAGNOSIS — Z23 Encounter for immunization: Secondary | ICD-10-CM | POA: Insufficient documentation

## 2014-05-10 LAB — TSH: TSH: 1.175 u[IU]/mL (ref 0.350–4.500)

## 2014-05-10 NOTE — Assessment & Plan Note (Signed)
Elevated at todays lab will add HBa1C to ensure pt is not diabetic

## 2014-05-10 NOTE — Assessment & Plan Note (Signed)
Treated for years by rheumatology

## 2014-05-10 NOTE — Assessment & Plan Note (Signed)
Elevated TG, and LDL , however , non fasting No med change Hyperlipidemia:Low fat diet discussed and encouraged.  Updated lab needed at/ before next visit.

## 2014-05-10 NOTE — Assessment & Plan Note (Addendum)
unchanged physical exam, pt incapable of living independently and is cared for by her spouse

## 2014-05-10 NOTE — Assessment & Plan Note (Signed)
Topical antibiotic x 1 week, call if not resolved for eval by opthalmology

## 2014-05-10 NOTE — Assessment & Plan Note (Signed)
Vaccine administered at visit.  

## 2014-05-14 LAB — HEMOGLOBIN A1C
Hgb A1c MFr Bld: 5.3 % (ref <5.7)
Mean Plasma Glucose: 105 mg/dL (ref <117)

## 2014-05-14 LAB — IRON AND TIBC
%SAT: 31 % (ref 20–55)
Iron: 67 ug/dL (ref 42–145)
TIBC: 215 ug/dL — ABNORMAL LOW (ref 250–470)
UIBC: 148 ug/dL (ref 125–400)

## 2014-05-14 LAB — FERRITIN: Ferritin: 1238 ng/mL — ABNORMAL HIGH (ref 10–291)

## 2014-05-19 ENCOUNTER — Other Ambulatory Visit: Payer: Self-pay | Admitting: Family Medicine

## 2014-05-22 DIAGNOSIS — M0579 Rheumatoid arthritis with rheumatoid factor of multiple sites without organ or systems involvement: Secondary | ICD-10-CM | POA: Diagnosis not present

## 2014-05-22 DIAGNOSIS — Z79899 Other long term (current) drug therapy: Secondary | ICD-10-CM | POA: Diagnosis not present

## 2014-06-08 ENCOUNTER — Other Ambulatory Visit: Payer: Self-pay | Admitting: Family Medicine

## 2014-06-26 DIAGNOSIS — Z79891 Long term (current) use of opiate analgesic: Secondary | ICD-10-CM | POA: Diagnosis not present

## 2014-07-25 ENCOUNTER — Ambulatory Visit (INDEPENDENT_AMBULATORY_CARE_PROVIDER_SITE_OTHER): Payer: Medicare Other | Admitting: Family Medicine

## 2014-07-25 ENCOUNTER — Encounter: Payer: Self-pay | Admitting: Family Medicine

## 2014-07-25 VITALS — BP 130/80 | HR 83 | Resp 18 | Ht 67.0 in | Wt 146.1 lb

## 2014-07-25 DIAGNOSIS — Z720 Tobacco use: Secondary | ICD-10-CM

## 2014-07-25 DIAGNOSIS — M069 Rheumatoid arthritis, unspecified: Secondary | ICD-10-CM

## 2014-07-25 DIAGNOSIS — I10 Essential (primary) hypertension: Secondary | ICD-10-CM

## 2014-07-25 DIAGNOSIS — E785 Hyperlipidemia, unspecified: Secondary | ICD-10-CM | POA: Diagnosis not present

## 2014-07-25 DIAGNOSIS — I69959 Hemiplegia and hemiparesis following unspecified cerebrovascular disease affecting unspecified side: Secondary | ICD-10-CM

## 2014-07-25 NOTE — Patient Instructions (Addendum)
Annual physical exam in 4 .5 month, call if to you need me before  Now that you are on 5 cigarettes daily, CONGRATS, by the time I next see you , hopefully ,  You would have quit!  Fasting liipid, cmp in 4.5 month  Pls schedule and keep mammogram

## 2014-08-14 ENCOUNTER — Other Ambulatory Visit: Payer: Self-pay | Admitting: Cardiology

## 2014-08-14 ENCOUNTER — Telehealth: Payer: Self-pay | Admitting: Cardiovascular Disease

## 2014-08-14 MED ORDER — SPIRONOLACTONE 25 MG PO TABS: 25.0000 mg | ORAL_TABLET | Freq: Every day | ORAL | Status: DC

## 2014-08-14 NOTE — Telephone Encounter (Signed)
Received fax refill request  Rx # [id number] Medication:  SPIRONOLACTONE 25MG  TABLETS Qty 30 Sig:  Take 1 tablet by mouth daily Physician:  Kate Sable

## 2014-08-14 NOTE — Telephone Encounter (Signed)
Dr branch is pts cardiologist

## 2014-08-14 NOTE — Telephone Encounter (Signed)
Received fax refill request  Rx # [id number] Medication:  Spironolactone 25 mg tablets Qty 30 Sig:  Take one tablet by mouth daily Physician:  Bronson Ing

## 2014-09-13 ENCOUNTER — Other Ambulatory Visit: Payer: Self-pay

## 2014-09-13 MED ORDER — ATORVASTATIN CALCIUM 80 MG PO TABS: 80.0000 mg | ORAL_TABLET | Freq: Every day | ORAL | Status: DC

## 2014-09-26 DIAGNOSIS — M0579 Rheumatoid arthritis with rheumatoid factor of multiple sites without organ or systems involvement: Secondary | ICD-10-CM | POA: Diagnosis not present

## 2014-09-29 NOTE — Assessment & Plan Note (Signed)
Followed and treated by rheumatology, denies joint pain or limitation in function from arthritis

## 2014-09-29 NOTE — Assessment & Plan Note (Signed)
Improved no change in medication Hyperlipidemia:Low fat diet discussed and encouraged.TG slightly elevated   Lipid Panel  Lab Results  Component Value Date   CHOL 142 05/09/2014   HDL 32* 05/09/2014   LDLCALC 77 05/09/2014   TRIG 166* 05/09/2014   CHOLHDL 4.4 05/09/2014

## 2014-09-29 NOTE — Assessment & Plan Note (Signed)
Controlled, no change in medication  

## 2014-09-29 NOTE — Assessment & Plan Note (Signed)
Has a reasonable amount of independence despite hemiparesis, no fall history, fall prevention reviewed with pt and her spouse who is her main caregiver

## 2014-09-29 NOTE — Assessment & Plan Note (Signed)
Improving Patient counseled for approximately 5 minutes regarding the health risks of ongoing nicotine use, specifically all types of cancer, heart disease, stroke and respiratory failure. The options available for help with cessation ,the behavioral changes to assist the process, and the option to either gradully reduce usage  Or abruptly stop.is also discussed. Pt is also encouraged to set specific goals in number of cigarettes used daily, as well as to set a quit date.  Number of cigarettes/cigars currently smoking daily: 5

## 2014-09-29 NOTE — Progress Notes (Signed)
   Subjective:    Patient ID: Danielle Cruz, female    DOB: 03-11-1956, 59 y.o.   MRN: [id number]  HPI The PT is here for follow up and re-evaluation of chronic medical conditions, medication management and review of any available recent lab and radiology data.  History is from her spouse as she has expressive aphasia Preventive health is updated, specifically  Cancer screening and Immunization.   Questions or concerns regarding consultations or procedures which the PT has had in the interim are  addressed. The PT denies any adverse reactions to current medications since the last visit.  There are no new concerns.  There are no specific complaints       Review of Systems See HPI Denies recent fever or chills. Denies sinus pressure, nasal congestion, ear pain or sore throat. Denies chest congestion, productive cough or wheezing. Denies chest pains, PND, orthopnea and leg swelling Denies abdominal pain, nausea, vomiting,diarrhea or constipation.   Denies dysuria, frequency, hesitancy or incontinence. . Denies headaches, seizures, numbness, or tingling. Denies depression, anxiety or insomnia. Denies skin break down or rash.        Objective:   Physical Exam  BP 130/80 mmHg  Pulse 83  Resp 18  Ht 5\' 7"  (1.702 m)  Wt 146 lb 1.3 oz (66.261 kg)  BMI 22.87 kg/m2  SpO2 98% Patient alert  and in no cardiopulmonary distress.  HEENT: No facial asymmetry, EOMI,   oropharynx pink and moist.  Neck supple no JVD, no mass.  Chest: Clear to auscultation bilaterally.  CVS: S1, S2 no murmurs, no S3.Regular rate.  ABD: Soft non tender.   Ext: No edema  MS: Adequate ROM spine, shoulders, hips and knees.  Skin: Intact, no ulcerations or rash noted.  Psych: Good eye contact, normal affect.  not anxious or depressed appearing.  CNS: Hemiparesis and aphasia both are chronic and unchnaged       Assessment & Plan:  Essential hypertension Controlled, no change in  medication   Cardiovascular disease Stable, asymptomatic   Hyperlipemia Improved no change in medication Hyperlipidemia:Low fat diet discussed and encouraged.TG slightly elevated   Lipid Panel  Lab Results  Component Value Date   CHOL 142 05/09/2014   HDL 32* 05/09/2014   LDLCALC 77 05/09/2014   TRIG 166* 05/09/2014   CHOLHDL 4.4 05/09/2014        Tobacco abuse Improving Patient counseled for approximately 5 minutes regarding the health risks of ongoing nicotine use, specifically all types of cancer, heart disease, stroke and respiratory failure. The options available for help with cessation ,the behavioral changes to assist the process, and the option to either gradully reduce usage  Or abruptly stop.is also discussed. Pt is also encouraged to set specific goals in number of cigarettes used daily, as well as to set a quit date.  Number of cigarettes/cigars currently smoking daily: 5   Rheumatoid arthritis Followed and treated by rheumatology, denies joint pain or limitation in function from arthritis  Hemiplegia, late effect of cerebrovascular disease Has a reasonable amount of independence despite hemiparesis, no fall history, fall prevention reviewed with pt and her spouse who is her main caregiver

## 2014-09-29 NOTE — Assessment & Plan Note (Signed)
Stable, asymptomatic. 

## 2014-10-04 ENCOUNTER — Other Ambulatory Visit: Payer: Self-pay

## 2014-10-04 MED ORDER — OXYBUTYNIN CHLORIDE 5 MG PO TABS: 2.5000 mg | ORAL_TABLET | Freq: Two times a day (BID) | ORAL | Status: DC

## 2014-10-18 ENCOUNTER — Ambulatory Visit (INDEPENDENT_AMBULATORY_CARE_PROVIDER_SITE_OTHER): Payer: Medicare Other | Admitting: Cardiology

## 2014-10-18 ENCOUNTER — Encounter: Payer: Self-pay | Admitting: Cardiology

## 2014-10-18 VITALS — BP 134/80 | HR 84 | Ht 69.0 in | Wt 145.0 lb

## 2014-10-18 DIAGNOSIS — R011 Cardiac murmur, unspecified: Secondary | ICD-10-CM | POA: Diagnosis not present

## 2014-10-18 DIAGNOSIS — I10 Essential (primary) hypertension: Secondary | ICD-10-CM

## 2014-10-18 DIAGNOSIS — E785 Hyperlipidemia, unspecified: Secondary | ICD-10-CM | POA: Diagnosis not present

## 2014-10-18 DIAGNOSIS — Z720 Tobacco use: Secondary | ICD-10-CM

## 2014-10-18 DIAGNOSIS — I2510 Atherosclerotic heart disease of native coronary artery without angina pectoris: Secondary | ICD-10-CM

## 2014-10-18 MED ORDER — LISINOPRIL 5 MG PO TABS: 5.0000 mg | ORAL_TABLET | Freq: Every day | ORAL | Status: DC

## 2014-10-18 NOTE — Progress Notes (Signed)
Clinical Summary Danielle Cruz is a 58 y.o.female seen today for follow up of the following medical problems.   1. CAD - prior stenting in 2005 to LAD and OM - echo 08/2008 LVEF 55-65%, no WMAs  - no chest pain, no SOB or DOE - compliant with meds  2. Hx of CVA - aphasic, on ASA and statin for secondary prevention  3. Hyperlipidemia - lipid panel 1.2016 TC 142 TG 166 HDL 32 LDL 77 - compliant with high dose staitn  4. HTN - does not check at home - compliant with meds   5. Tobacco - still smoking   Past Medical History  Diagnosis Date  . Arteriosclerotic cardiovascular disease (ASCVD) 07/2003    DES to the LAD and the BMS to the OM1- normal  EF  . Hyperlipidemia   . Hypertension 2005  . Tobacco abuse, in remission     Quit in 2010; 30-pack-year history  . CVA (cerebral infarction) 08/2008    sizable left -residual expressive aphasia, right sided weakness; ambulates with difficulty with the right leg brace   . Rheumatoid arthritis(714.0)   . Colonic polyp 2010    Hemorrhoids; h/o mild hematochezia     No Known Allergies   Current Outpatient Prescriptions  Medication Sig Dispense Refill  . aspirin (BAYER ASPIRIN) 325 MG tablet Take 325 mg by mouth daily. Take one by mouth once daily     . atorvastatin (LIPITOR) 80 MG tablet Take 1 tablet (80 mg total) by mouth daily. 30 tablet 5  . chlorthalidone (HYGROTON) 25 MG tablet TAKE ONE-HALF TABLET BY MOUTH EVERY DAY 60 tablet 9  . Cholecalciferol (VITAMIN D) 400 UNITS capsule Take 400 Units by mouth daily. Take 1 tablet by mouth daily     . FERROUS GLUCONATE IRON PO Take 325 mg by mouth daily.     Marland Kitchen HYDROcodone-acetaminophen (NORCO) 10-325 MG per tablet   0  . leflunomide (ARAVA) 20 MG tablet Take 20 mg by mouth daily.      . metoprolol succinate (TOPROL-XL) 25 MG 24 hr tablet TAKE 1 TABLET BY MOUTH DAILY 30 tablet 11  . oxybutynin (DITROPAN) 5 MG tablet Take 0.5 tablets (2.5 mg total) by mouth 2 (two) times daily. 30  tablet 3  . potassium chloride SA (K-DUR,KLOR-CON) 20 MEQ tablet TAKE 1 TABLET BY MOUTH EVERY DAY 30 tablet 6  . spironolactone (ALDACTONE) 25 MG tablet Take 1 tablet (25 mg total) by mouth daily. 30 tablet 6   No current facility-administered medications for this visit.     Past Surgical History  Procedure Laterality Date  . Ankle surgery      Right  . Total abdominal hysterectomy w/ bilateral salpingoophorectomy    . Colonoscopy w/ polypectomy  11/2008    Pedunculated polyp snared     No Known Allergies    Family History  Problem Relation Age of Onset  . Cancer Father     colon  . Cancer Sister     brain   . Cancer Brother      Social History Danielle Cruz reports that she has been smoking Cigarettes.  She does not have any smokeless tobacco history on file. Danielle Cruz reports that she does not drink alcohol.   Review of Systems CONSTITUTIONAL: No weight loss, fever, chills, weakness or fatigue.  HEENT: Eyes: No visual loss, blurred vision, double vision or yellow sclerae.No hearing loss, sneezing, congestion, runny nose or sore throat.  SKIN: No rash or itching.  CARDIOVASCULAR: per HPI RESPIRATORY: No shortness of breath, cough or sputum.  GASTROINTESTINAL: No anorexia, nausea, vomiting or diarrhea. No abdominal pain or blood.  GENITOURINARY: No burning on urination, no polyuria NEUROLOGICAL: chronic leg and arm weakness MUSCULOSKELETAL: No muscle, back pain, joint pain or stiffness.  LYMPHATICS: No enlarged nodes. No history of splenectomy.  PSYCHIATRIC: No history of depression or anxiety.  ENDOCRINOLOGIC: No reports of sweating, cold or heat intolerance. No polyuria or polydipsia.  Marland Kitchen   Physical Examination Filed Vitals:   10/18/14 1005  BP: 134/80  Pulse: 84   Filed Vitals:   10/18/14 1005  Height: 5\' 9"  (1.753 m)  Weight: 145 lb (65.772 kg)     Gen: resting comfortably, no acute distress HEENT: no scleral icterus, pupils equal round and reactive,  no palptable cervical adenopathy,  CV: RRR, 2/6 systolic murmur RUSB Resp: Clear to auscultation bilaterally GI: abdomen is soft, non-tender, non-distended, normal bowel sounds, no hepatosplenomegaly MSK: extremities are warm, no edema.  Skin: warm, no rash Neuro:  Right arm and leg weakness, aphasic Psych: appropriate affect   Diagnostic Studies  07/2003 Cath RESULTS:  HEMODYNAMICS:  1. Left ventricular pressure 145/12.  2. Aortic pressure 150/82.  3. There was no aortic valve gradient.  LEFT VENTRICULOGRAM: Wall motion is normal. Ejection fraction estimated at  greater than or equal to 65%. There is no mitral regurgitation.  CORONARY ARTERIOGRAPHY (CO-DOMINANT):  1. Left main is normal.  2. Left anterior descending artery has a tubular 20% stenosis in the  proximal to mid vessel. The distal LAD has a tubular 80% stenosis. The  apical LAD has a tubular 80% stenosis. The LAD gives rise to a small  diagonal Danielle Cruz.  3. The left circumflex was a large co-dominant vessel. It gives rise to a  large first obtuse marginal Danielle Cruz. There is a 90% stenosis in the mid  portion of the first obtuse marginal Danielle Cruz. There is haziness  associated with this lesion, and it has the appearance of a positive  ruptured plaque. There is a normal sized second obtuse marginal Danielle Cruz,  which has a 50% stenosis proximally and a 60% stenosis distally. The  distal circumflex also gives rise to 2 small posterolateral branches.  4. The right coronary artery is a relatively small co-dominant vessel.  There is diffuse 60% stenosis in the proximal vessel. In the mid vessel  at the acute margin, there is a 20% stenosis and the distal vessel has a  20% stenosis. There is a small to normal sized acute marginal Danielle Cruz,  which has a 70% at its origin. The distal right coronary artery also  gives rise to a small posterior descending coronary artery and a small  posterolateral Danielle Cruz.   IMPRESSION:  1. Normal left ventricular systolic function.  2. Three-vessel coronary artery disease, as described. The culprit lesion  appears to be the 90% stenosis in the first obtuse marginal Danielle Cruz.  There is also significant disease in the distal LAD and moderate, but  nonobstructive, disease in the small right coronary artery.  PLAN: Percutaneous intervention of the obtuse marginal and the LAD, see  below.  PTCA PROCEDURAL NOTE: Following completion of diagnostic catheterization,  we proceeded with percutaneous coronary intervention. We utilized the  preexisting #6 French sheath in the right femoral artery. Heparin and  Integrilin were administered per protocol. We used a #6 [place] LS 3.5  guiding catheter. A BMW wire was advanced under fluoroscopic guidance into  the distal portion of the first obtuse  marginal Tamaiya Bump. We then performed  PTCA of the 90% stenosis in the obtuse marginal with a 2.5 x 12 mm Quantum  balloon inflated to 10 atmospheres. We then positioned a 2.5 x 13 mm CYPHER  drug-eluting stent across the area of diseased vessel and deployed the stent  at 15 atmospheres. We then went back with a 2.5 x 12 mm Quantum balloon  within the stent and inflated this balloon to 22 atmospheres. The final  angiographic images were obtained revealing patency of the obtuse marginal  Erisha Paugh with 0% residual stenosis and TIMI 3 flow.  We then turned our attention to the LAD. The BMW wire was advanced under  fluoroscopic guidance into the apical portion of the LAD. We then performed  PTCA of the 80% stenosis in the distal LAD with a 2.0 x 15 mm Quantum  balloon inflated to 12 atmospheres. We then positioned a 2.0 x 15 mm Mini  Vision bare metal stent across the area of segment of disease and deployed  the stent at 12 atmospheres. We then went back with a 2.25 x 12 mm Quantum  balloon and positioning this within the stent, we inflated this balloon to  14  atmospheres in the proximal and distal edges of the stent. Final  angiographic images are obtained revealing patency of the LAD at the stent  site and with 0% residual stenosis and TIMI 3 flow.  COMPLICATIONS: None.  RESULTS:  1. Successful PTCA with placement of a drug-eluting stent in the first  obtuse marginal Symphoni Helbling. A 90% stenosis with haziness was reduced to 0%  residual with TIMI 3 flow.  2. Successful PTCA with placement of a bare metal stent in the distal left  anterior descending artery. A tubular 80% stenosis was reduced to 0%  residual with TIMI flow.  PLAN: Integrilin Cruz be continued for 18 hours. It is recommended that  the patient Cruz be treated with Plavix for a minimum of 6-9 months. She  also needs aggressive risk factor modification. Regarding the residual  disease in the apical LAD, at this point, would recommend continue medical  therapy. If she has recurrent chest pain, which is refractor to medical  therapy, percutaneous coronary intervention of the apical LAD could be  considered.  08/2008 Echo Study Conclusions  1. Left ventricle: The cavity size was normal. Systolic function was normal. The estimated ejection fraction was in the range of 55% to 65%. Wall motion was normal; there were no regional wall motion abnormalities. 2. Aortic valve: Trivial regurgitation. 3. Mitral valve: No evidence of vegetation. 4. Left atrium: No evidence of thrombus in the atrial cavity or appendage. 5. Atrial septum: Echo contrast study showed no right-to-left atrial level shunt, in the baseline state (Late bubbles felt to be nondiagnostic) There was an atrial septal aneurysm.      Assessment and Plan  1. CAD - no current symptoms, continue risk factor modification and secondary prevention - start lisionpril 5mg  daily for secondary prevention  2. Hx of CVA - continue ASA and statin for secondary prevention - start lisionpril 5mg  daily for secondary  prevention. Check BMET in 2 weeks.   3. Hyperlipidemia - LDL at goal. Counseled on dietary changes to lower TGs   4. HTN - at goal, continue current meds  5. Tobacco - counseled on health benefits of cessation and advised to quit,   6. Heart murmur - obtain echo  F/u 1 year    Arnoldo Lenis, M.D.

## 2014-10-18 NOTE — Patient Instructions (Signed)
Your physician wants you to follow-up in: 1 year with Dr.Branch You will receive a reminder letter in the mail two months in advance. If you don't receive a letter, please call our office to schedule the follow-up appointment.   START Lisinopril 5 mg daily, in 2 weeks get lab work : BMET   Your physician has requested that you have an echocardiogram. Echocardiography is a painless test that uses sound waves to create images of your heart. It provides your doctor with information about the size and shape of your heart and how well your heart's chambers and valves are working. This procedure takes approximately one hour. There are no restrictions for this procedure.    Thank you for choosing [facility] !

## 2014-10-26 ENCOUNTER — Other Ambulatory Visit: Payer: Self-pay | Admitting: Adult Health

## 2014-11-07 ENCOUNTER — Other Ambulatory Visit: Payer: Self-pay | Admitting: Cardiology

## 2014-11-08 ENCOUNTER — Ambulatory Visit (HOSPITAL_COMMUNITY): Admission: RE | Admit: 2014-11-08 | Payer: Medicare Other | Source: Ambulatory Visit

## 2014-11-13 ENCOUNTER — Telehealth: Payer: Self-pay | Admitting: *Deleted

## 2014-11-13 NOTE — Telephone Encounter (Signed)
Pt is scheduled for Monday at 1:00, female called for pt stating pt has a bad cough and it is so bad she throws up, pt has been unable to eat or anything. She would like to know if there is anything she can do until her appt to control the coughing.

## 2014-11-14 NOTE — Telephone Encounter (Signed)
Patient's caretaker called concerned with patients nagging cough. Has been taking robitussin and coughdrops with no relief. No signs of sickness or mucus production. Caregiver concerned that she has coughed so long >1 month and its starting to affect her eating. Appt Monday. I do believe its ACE related. Want to wait until appt or go ahead and stop lisinopril. Please advise

## 2014-11-14 NOTE — Telephone Encounter (Signed)
No med change will f/u on Monday

## 2014-11-15 NOTE — Telephone Encounter (Signed)
Message left for [place]

## 2014-11-18 ENCOUNTER — Ambulatory Visit (INDEPENDENT_AMBULATORY_CARE_PROVIDER_SITE_OTHER): Payer: Medicare Other | Admitting: Family Medicine

## 2014-11-18 ENCOUNTER — Encounter: Payer: Self-pay | Admitting: Family Medicine

## 2014-11-18 ENCOUNTER — Other Ambulatory Visit: Payer: Self-pay | Admitting: Family Medicine

## 2014-11-18 VITALS — BP 104/62 | HR 87 | Temp 98.1°F | Resp 18 | Ht 67.0 in | Wt 140.0 lb

## 2014-11-18 DIAGNOSIS — F17210 Nicotine dependence, cigarettes, uncomplicated: Secondary | ICD-10-CM | POA: Diagnosis not present

## 2014-11-18 DIAGNOSIS — I69959 Hemiplegia and hemiparesis following unspecified cerebrovascular disease affecting unspecified side: Secondary | ICD-10-CM

## 2014-11-18 DIAGNOSIS — Z1211 Encounter for screening for malignant neoplasm of colon: Secondary | ICD-10-CM | POA: Diagnosis not present

## 2014-11-18 DIAGNOSIS — R05 Cough: Secondary | ICD-10-CM

## 2014-11-18 DIAGNOSIS — E785 Hyperlipidemia, unspecified: Secondary | ICD-10-CM

## 2014-11-18 DIAGNOSIS — R7301 Impaired fasting glucose: Secondary | ICD-10-CM

## 2014-11-18 DIAGNOSIS — M069 Rheumatoid arthritis, unspecified: Secondary | ICD-10-CM

## 2014-11-18 DIAGNOSIS — D539 Nutritional anemia, unspecified: Secondary | ICD-10-CM | POA: Diagnosis not present

## 2014-11-18 DIAGNOSIS — Z1231 Encounter for screening mammogram for malignant neoplasm of breast: Secondary | ICD-10-CM

## 2014-11-18 DIAGNOSIS — R059 Cough, unspecified: Secondary | ICD-10-CM

## 2014-11-18 DIAGNOSIS — D649 Anemia, unspecified: Secondary | ICD-10-CM | POA: Diagnosis not present

## 2014-11-18 DIAGNOSIS — R194 Change in bowel habit: Secondary | ICD-10-CM

## 2014-11-18 DIAGNOSIS — R740 Nonspecific elevation of levels of transaminase and lactic acid dehydrogenase [LDH]: Secondary | ICD-10-CM

## 2014-11-18 DIAGNOSIS — I10 Essential (primary) hypertension: Secondary | ICD-10-CM | POA: Diagnosis not present

## 2014-11-18 DIAGNOSIS — R7401 Elevation of levels of liver transaminase levels: Secondary | ICD-10-CM

## 2014-11-18 DIAGNOSIS — Z720 Tobacco use: Secondary | ICD-10-CM

## 2014-11-18 DIAGNOSIS — R748 Abnormal levels of other serum enzymes: Secondary | ICD-10-CM | POA: Diagnosis not present

## 2014-11-18 DIAGNOSIS — I2510 Atherosclerotic heart disease of native coronary artery without angina pectoris: Secondary | ICD-10-CM

## 2014-11-18 LAB — POC HEMOCCULT BLD/STL (OFFICE/1-CARD/DIAGNOSTIC): Fecal Occult Blood, POC: NEGATIVE

## 2014-11-18 NOTE — Patient Instructions (Addendum)
F/u with pelvic and pap in 4 month, call if you need me sooner  Stool has no hidden blood  STOP lisinopril, the new BP medication, I believe this is the cause of your cough, I will send a message to Dr Harl Bowie also   Now at 10 cigarettes daily, need to keep cutting down, hope to get to 5 per day in next 4 month  For diarrhea, need to take stool samples to lab for testing if this continues into next week  I will  send in medication to use for the next 3 o 5 days in the hope that it stops, lomotil  Start benefiber/ fibercon every day to attempt to get stool more firm and less watery  Labs today lipid, cmp and EGFR, CBC and diff     You are referred for a mammogram  Hope you feel better soon  Thanks for choosing [facility], we consider it a privelige to serve you.

## 2014-11-19 LAB — LIPID PANEL
Cholesterol: 128 mg/dL (ref 125–200)
HDL: 26 mg/dL — ABNORMAL LOW (ref >=46)
LDL Cholesterol: 56 mg/dL (ref <130)
Total CHOL/HDL Ratio: 4.9 Ratio (ref <=5.0)
Triglycerides: 231 mg/dL — ABNORMAL HIGH (ref <150)
VLDL: 46 mg/dL — ABNORMAL HIGH (ref <30)

## 2014-11-19 LAB — CBC WITH DIFFERENTIAL/PLATELET
Basophils Absolute: 0 10*3/uL (ref 0.0–0.1)
Basophils Relative: 0 % (ref 0–1)
Eosinophils Absolute: 0.1 10*3/uL (ref 0.0–0.7)
Eosinophils Relative: 1 % (ref 0–5)
HCT: 31.8 % — ABNORMAL LOW (ref 36.0–46.0)
Hemoglobin: 10.3 g/dL — ABNORMAL LOW (ref 12.0–15.0)
Lymphocytes Relative: 25 % (ref 12–46)
Lymphs Abs: 1.6 10*3/uL (ref 0.7–4.0)
MCH: 31.5 pg (ref 26.0–34.0)
MCHC: 32.4 g/dL (ref 30.0–36.0)
MCV: 97.2 fL (ref 78.0–100.0)
MPV: 10.5 fL (ref 8.6–12.4)
Monocytes Absolute: 1 10*3/uL (ref 0.1–1.0)
Monocytes Relative: 15 % — ABNORMAL HIGH (ref 3–12)
Neutro Abs: 3.8 10*3/uL (ref 1.7–7.7)
Neutrophils Relative %: 59 % (ref 43–77)
Platelets: 211 10*3/uL (ref 150–400)
RBC: 3.27 MIL/uL — ABNORMAL LOW (ref 3.87–5.11)
RDW: 14.8 % (ref 11.5–15.5)
WBC: 6.5 10*3/uL (ref 4.0–10.5)

## 2014-11-19 LAB — COMPLETE METABOLIC PANEL WITH GFR
ALT: 57 U/L — ABNORMAL HIGH (ref 6–29)
AST: 43 U/L — ABNORMAL HIGH (ref 10–35)
Albumin: 3.7 g/dL (ref 3.6–5.1)
Alkaline Phosphatase: 86 U/L (ref 33–130)
BUN: 14 mg/dL (ref 7–25)
CO2: 21 mmol/L (ref 20–31)
Calcium: 9.3 mg/dL (ref 8.6–10.4)
Chloride: 113 mmol/L — ABNORMAL HIGH (ref 98–110)
Creat: 0.94 mg/dL (ref 0.50–1.05)
GFR, Est African American: 77 mL/min (ref >=60)
GFR, Est Non African American: 67 mL/min (ref >=60)
Glucose, Bld: 104 mg/dL — ABNORMAL HIGH (ref 65–99)
Potassium: 4.9 mmol/L (ref 3.5–5.3)
Sodium: 140 mmol/L (ref 135–146)
Total Bilirubin: 0.4 mg/dL (ref 0.2–1.2)
Total Protein: 6.7 g/dL (ref 6.1–8.1)

## 2014-11-21 LAB — VITAMIN B12: Vitamin B-12: 535 pg/mL (ref 211–911)

## 2014-11-21 LAB — HEPATITIS PANEL, ACUTE
HCV Ab: NEGATIVE
Hep A IgM: NONREACTIVE
Hep B C IgM: NONREACTIVE
Hepatitis B Surface Ag: NEGATIVE

## 2014-11-21 LAB — IRON: Iron: 113 ug/dL (ref 42–145)

## 2014-11-21 LAB — HEMOGLOBIN A1C
Hgb A1c MFr Bld: 5.6 % (ref <5.7)
Mean Plasma Glucose: 114 mg/dL (ref <117)

## 2014-11-21 LAB — FERRITIN: Ferritin: 1280 ng/mL — ABNORMAL HIGH (ref 10–291)

## 2014-12-07 DIAGNOSIS — I2510 Atherosclerotic heart disease of native coronary artery without angina pectoris: Secondary | ICD-10-CM | POA: Insufficient documentation

## 2014-12-07 DIAGNOSIS — R05 Cough: Secondary | ICD-10-CM | POA: Insufficient documentation

## 2014-12-07 DIAGNOSIS — R059 Cough, unspecified: Secondary | ICD-10-CM | POA: Insufficient documentation

## 2014-12-07 DIAGNOSIS — R194 Change in bowel habit: Secondary | ICD-10-CM | POA: Insufficient documentation

## 2014-12-07 DIAGNOSIS — Z955 Presence of coronary angioplasty implant and graft: Secondary | ICD-10-CM | POA: Insufficient documentation

## 2014-12-07 NOTE — Assessment & Plan Note (Signed)
New dry cough following addition of ACR, advised pt to d/c same and will let cardiologist know Still smoking though cutting back, asymptomatic from CV standpount

## 2014-12-07 NOTE — Assessment & Plan Note (Signed)
deterioration  With recent ;lab draw, stool is heme negative, will refer to GI

## 2014-12-07 NOTE — Assessment & Plan Note (Signed)
New onset loose stool, linked to CE addition, which is not expected, short course of lomoitil prescribed. If this continues, pt is to have stool tested for infection

## 2014-12-07 NOTE — Assessment & Plan Note (Signed)

## 2014-12-07 NOTE — Assessment & Plan Note (Signed)
Deterioration with recent lab draw, refer rept [place] and to GI

## 2014-12-07 NOTE — Assessment & Plan Note (Signed)
Stable and unchnaged, pt is able to stand and anmbulate however high fall risk

## 2014-12-07 NOTE — Assessment & Plan Note (Signed)
Controlled, no change in medication  

## 2014-12-07 NOTE — Assessment & Plan Note (Signed)
Controlled, no change in medication Hyperlipidemia:Low fat diet discussed and encouraged.   Lipid Panel  Lab Results  Component Value Date   CHOL 128 11/18/2014   HDL 26* 11/18/2014   LDLCALC 56 11/18/2014   TRIG 231* 11/18/2014   CHOLHDL 4.9 11/18/2014

## 2014-12-07 NOTE — Assessment & Plan Note (Signed)
Stable, managed by rheumatologist

## 2014-12-07 NOTE — Assessment & Plan Note (Signed)
New onset dry hacking cough, likley due to ACE, ptto d/c same and call back if persists

## 2014-12-07 NOTE — Assessment & Plan Note (Signed)
rept H Ba1C is normal, pt advised to reduce carb and sugar intake to prevent development of diabetes

## 2014-12-07 NOTE — Progress Notes (Signed)
Danielle Cruz     MRN: [id number]      DOB: 03/14/1956   HPI Danielle Cruz is here for follow up and re-evaluation of chronic medical conditions, medication management and review of any available recent lab and radiology data.  Preventive health is updated, specifically  Cancer screening and Immunization.   Questions or concerns regarding consultations or procedures which the PT has had in the interim are  addressed. C/o new dry cough since starting lisinopril, cough is very persistent, no fever or chills or sputum C/o loose frequent stool in past several weeks, no visible blood or ,mucus, felt to be aggravated by new medication a;lso   ROS Denies recent fever or chills. Denies sinus pressure, nasal congestion, ear pain or sore throat. Denies chest congestion, productive cough or wheezing. Denies chest pains, palpitations and leg swelling  Denies dysuria, frequency, hesitancy or incontinence. Denies headaches, seizures, numbness, or tingling. Denies depression, anxiety or insomnia. Denies skin break down or rash.   PE  BP 104/62 mmHg  Pulse 87  Temp(Src) 98.1 F (36.7 C)  Resp 18  Ht 5\' 7"  (1.702 m)  Wt 140 lb 0.6 oz (63.522 kg)  BMI 21.93 kg/m2  SpO2 95%  Patient alert  and in no cardiopulmonary distress.  HEENT: No facial asymmetry, EOMI,   oropharynx pink and moist.  Neck supple no JVD, no mass.  Chest: Clear to auscultation bilaterally.decreased air entry  CVS: S1, S2 no murmurs, no S3.Regular rate.  ABD: Soft non tender. No organomegaly or mass, normal BS Rectal :P no mass, heme negative stool  Ext: No edema  MS: Adequate though reduced  ROM spine, shoulders, hips and knees.  Skin: Intact, no ulcerations or rash noted.  Psych: Good eye contact, normal affect. Memory intact not anxious or depressed appearing.  CNS: CN 2-12 intact, grade 3  Power in right upper an lower extremities, expressive ahasia  Assessment & Plan   **CAD in native artery New dry cough  following addition of ACR, advised pt to d/c same and will let cardiologist know Still smoking though cutting back, asymptomatic from CV standpount  Essential hypertension Controlled, no change in medication   Hyperlipemia Controlled, no change in medication Hyperlipidemia:Low fat diet discussed and encouraged.   Lipid Panel  Lab Results  Component Value Date   CHOL 128 11/18/2014   HDL 26* 11/18/2014   LDLCALC 56 11/18/2014   TRIG 231* 11/18/2014   CHOLHDL 4.9 11/18/2014        Hemiplegia, late effect of cerebrovascular disease Stable and unchnaged, pt is able to stand and anmbulate however high fall risk  Tobacco abuse Patient counseled for approximately 5 minutes regarding the health risks of ongoing nicotine use, specifically all types of cancer, heart disease, stroke and respiratory failure. The options available for help with cessation ,the behavioral changes to assist the process, and the option to either gradully reduce usage  Or abruptly stop.is also discussed. Pt is also encouraged to set specific goals in number of cigarettes used daily, as well as to set a quit date.  Number of cigarettes/cigars currently smoking daily: 10   Anemia deterioration  With recent ;lab draw, stool is heme negative, will refer to GI  Elevated transaminase level Deterioration with recent lab draw, refer rept [place] and to GI  Rheumatoid arthritis Stable, managed by rheumatologist  Fasting hyperglycemia rept H Ba1C is normal, pt advised to reduce carb and sugar intake to prevent development of diabetes  Bowel habit changes  New onset loose stool, linked to CE addition, which is not expected, short course of lomoitil prescribed. If this continues, pt is to have stool tested for infection  Cough New onset dry hacking cough, likley due to ACE, ptto d/c same and call back if persists

## 2014-12-09 ENCOUNTER — Encounter: Payer: Self-pay | Admitting: Internal Medicine

## 2014-12-10 ENCOUNTER — Ambulatory Visit (HOSPITAL_COMMUNITY): Admission: RE | Admit: 2014-12-10 | Payer: Medicare Other | Source: Ambulatory Visit

## 2014-12-12 ENCOUNTER — Encounter: Payer: Medicare Other | Admitting: Family Medicine

## 2014-12-18 ENCOUNTER — Other Ambulatory Visit: Payer: Self-pay | Admitting: Cardiology

## 2014-12-20 ENCOUNTER — Encounter: Payer: Self-pay | Admitting: Gastroenterology

## 2014-12-20 ENCOUNTER — Telehealth: Payer: Self-pay | Admitting: Gastroenterology

## 2014-12-20 ENCOUNTER — Ambulatory Visit: Payer: Medicare Other | Admitting: Gastroenterology

## 2014-12-20 NOTE — Telephone Encounter (Signed)
PATIENT WAS A NO SHOW AND LETTER SENT  °

## 2015-01-08 ENCOUNTER — Encounter: Payer: Self-pay | Admitting: Gastroenterology

## 2015-01-08 ENCOUNTER — Ambulatory Visit (INDEPENDENT_AMBULATORY_CARE_PROVIDER_SITE_OTHER): Payer: Medicare Other | Admitting: Gastroenterology

## 2015-01-08 VITALS — BP 136/78 | HR 78 | Temp 98.4°F | Ht 66.0 in | Wt 143.0 lb

## 2015-01-08 DIAGNOSIS — I2510 Atherosclerotic heart disease of native coronary artery without angina pectoris: Secondary | ICD-10-CM

## 2015-01-08 DIAGNOSIS — R7989 Other specified abnormal findings of blood chemistry: Secondary | ICD-10-CM | POA: Diagnosis not present

## 2015-01-08 DIAGNOSIS — D649 Anemia, unspecified: Secondary | ICD-10-CM | POA: Diagnosis not present

## 2015-01-08 DIAGNOSIS — R740 Nonspecific elevation of levels of transaminase and lactic acid dehydrogenase [LDH]: Secondary | ICD-10-CM

## 2015-01-08 DIAGNOSIS — R7401 Elevation of levels of liver transaminase levels: Secondary | ICD-10-CM

## 2015-01-08 DIAGNOSIS — R198 Other specified symptoms and signs involving the digestive system and abdomen: Secondary | ICD-10-CM | POA: Diagnosis not present

## 2015-01-08 NOTE — Progress Notes (Signed)
CC'D TO PCP °

## 2015-01-08 NOTE — Progress Notes (Addendum)
Primary Care Physician:  Tula Nakayama, MD  Primary Gastroenterologist:  Garfield Cornea, MD   Chief Complaint  Patient presents with  . Anemia    HPI:  Danielle Cruz is a 59 y.o. female here for further evaluation of anemia and elevated LFTs. Patient last seen by our practice in April 2011 for hematochezia felt to be related to benign polyp which was removed from the colon. Last colonoscopy in August 2010, she had left-sided diverticula, pedunculated polyp in the mid sigmoid colon, pathology revealed no adenomatous changes. At the time it was reported that her father had died from colon cancer in his 68s but her sister confirms that was actually prostate cancer. There is a family history of colon cancer in a maternal aunt greater than the age of 22.  Patient is feeling well. She denies any abdominal pain, constipation, diarrhea, melena, rectal bleeding, heartburn, dysphagia, nausea or vomiting. Appetite is good.  Recently noted to have slight decline in her hemoglobin hemoglobin 11.98 months ago down to 10.3 currently.She has been on chronic oral iron therapy. She had one negative Hemoccult card recently. Iron was 113. Ferritin over 1200 both recently and 8 months ago. B12 level normal. She has a history of mild transaminitis dating back to at least October 2014. Hepatitis B and C serologies negative. Abdominal ultrasound in April 2015 suggestive of fatty liver.    Current Outpatient Prescriptions  Medication Sig Dispense Refill  . aspirin EC 81 MG tablet Take 81 mg by mouth daily.    Marland Kitchen atorvastatin (LIPITOR) 80 MG tablet Take 1 tablet (80 mg total) by mouth daily. 30 tablet 5  . chlorthalidone (HYGROTON) 25 MG tablet TAKE 1/2 TABLET BY MOUTH EVERY DAY 60 tablet 3  . Cholecalciferol (VITAMIN D) 400 UNITS capsule Take 400 Units by mouth daily. Take 1 tablet by mouth daily     . FERROUS GLUCONATE IRON PO Take 325 mg by mouth daily.     Marland Kitchen HYDROcodone-acetaminophen (NORCO) 10-325 MG per tablet    0  . leflunomide (ARAVA) 20 MG tablet Take 20 mg by mouth daily.      . metoprolol succinate (TOPROL-XL) 25 MG 24 hr tablet TAKE 1 TABLET BY MOUTH EVERY DAY 30 tablet 11  . oxybutynin (DITROPAN) 5 MG tablet Take 0.5 tablets (2.5 mg total) by mouth 2 (two) times daily. 30 tablet 3  . potassium chloride SA (K-DUR,KLOR-CON) 20 MEQ tablet TAKE 1 TABLET BY MOUTH EVERY DAY 30 tablet 6  . spironolactone (ALDACTONE) 25 MG tablet Take 1 tablet (25 mg total) by mouth daily. 30 tablet 6   No current facility-administered medications for this visit.    Allergies as of 01/08/2015 - Review Complete 01/08/2015  Allergen Reaction Noted  . Ace inhibitors Cough 11/18/2014    Past Medical History  Diagnosis Date  . Arteriosclerotic cardiovascular disease (ASCVD) 07/2003    DES to the LAD and the BMS to the OM1- normal  EF  . Hyperlipidemia   . Hypertension 2005  . Tobacco abuse, in remission     Quit in 2010; 30-pack-year history  . CVA (cerebral infarction) 08/2008    sizable left -residual expressive aphasia, right sided weakness; ambulates with difficulty with the right leg brace   . Rheumatoid arthritis(714.0)   . Colonic polyp 2010    Hemorrhoids; h/o mild hematochezia    Past Surgical History  Procedure Laterality Date  . Ankle surgery      Right  . Oophorectomy    . Colonoscopy  w/ polypectomy  11/2008    Dr. Gala Romney. Left-sided diverticula, Pedunculated polyp snared (no adenomatous changes)    Family History  Problem Relation Age of Onset  . Cancer Father     prostate, deceased 61s  . Breast cancer Sister     Deceased 54s  . Cancer Brother     lymphoma  . Heart attack Mother     deceased 47, during childbirth  . Colon cancer Maternal Aunt     older than age 37  . Liver disease Neg Hx     Social History   Social History  . Marital Status: Married    Spouse Name: N/A  . Number of Children: 3  . Years of Education: N/A   Occupational History  . disabilty x 9years      secondary to arthritis    Social History Main Topics  . Smoking status: Current Some Day Smoker    Types: Cigarettes  . Smokeless tobacco: Not on file  . Alcohol Use: No     Comment: quit 6 years ago   . Drug Use: No  . Sexual Activity: Not on file   Other Topics Concern  . Not on file   Social History Narrative   Pt gave birth to 4 children, 1 deceased at 52 weeks was a premature baby, 3 are living ages range 22 to 52 all healthy       ROS:  General: Negative for anorexia, weight loss, fever, chills, fatigue, weakness. +expressive aphasia. Eyes: Negative for vision changes.  ENT: Negative for hoarseness, difficulty swallowing , nasal congestion. CV: Negative for chest pain, angina, palpitations, dyspnea on exertion, peripheral edema.  Respiratory: Negative for dyspnea at rest, dyspnea on exertion, cough, sputum, wheezing.  GI: See history of present illness. GU:  Negative for dysuria, hematuria, urinary incontinence, urinary frequency, nocturnal urination.  MS: Negative for joint pain, low back pain.  Derm: Negative for rash or itching.  Neuro: Negative for seizure, frequent headaches, memory loss, confusion. +right sided weakness Psych: Negative for anxiety, depression, suicidal ideation, hallucinations.  Endo: Negative for unusual weight change.  Heme: Negative for bruising or bleeding. Allergy: Negative for rash or hives.    Physical Examination:  BP 136/78 mmHg  Pulse 78  Temp(Src) 98.4 F (36.9 C) (Oral)  Ht 5\' 6"  (1.676 m)  Wt 143 lb (64.864 kg)  BMI 23.09 kg/m2   General: Well-nourished, well-developed in no acute distress.  Head: Normocephalic, atraumatic.   Eyes: Conjunctiva pink, no icterus. Mouth: Oropharyngeal mucosa moist and pink , no lesions erythema or exudate. Neck: Supple without thyromegaly, masses, or lymphadenopathy.  Lungs: Clear to auscultation bilaterally.  Heart: Regular rate and rhythm, no murmurs rubs or gallops.  Abdomen: Bowel sounds  are normal, nontender, nondistended, no hepatosplenomegaly, no abdominal bruits or    hernia , no rebound or guarding.  Fullness noted in LUQ, nontender.  Rectal: not performed Extremities: No lower extremity edema. No clubbing or deformities.  Neuro: Alert and oriented x 4 , grossly normal neurologically.  Skin: Warm and dry, no rash or jaundice.   Psych: Alert and cooperative, normal mood and affect.  Labs: Heme Negative X 1 11/18/14  Lab Results  Component Value Date   WBC 6.5 11/18/2014   HGB 10.3* 11/18/2014   HCT 31.8* 11/18/2014   MCV 97.2 11/18/2014   PLT 211 11/18/2014   Lab Results  Component Value Date   IRON 113 11/18/2014         FERRITIN 1280*  11/18/2014   Lab Results  Component Value Date   OJJKKXFG18 299 11/18/2014   No results found for: FOLATE Lab Results  Component Value Date   CREATININE 0.94 11/18/2014   BUN 14 11/18/2014   NA 140 11/18/2014   K 4.9 11/18/2014   CL 113* 11/18/2014   CO2 21 11/18/2014   Lab Results  Component Value Date   ALT 57* 11/18/2014   AST 43* 11/18/2014   ALKPHOS 86 11/18/2014   BILITOT 0.4 11/18/2014  11/18/14 Hep B surface Ag Neg HCV Ab NEG Hep B C IgM NR Hep A IgM NR  Imaging Studies: Abd U/S from 07/2013 Probable fatty liver

## 2015-01-08 NOTE — Assessment & Plan Note (Signed)
Mild transaminitis in the setting of Lipitor and Arava. Significantly elevated ferritin on 2 separate occasions. Possibly acute phase reactant however need to exclude hemochromatosis. Also check autoimmune serologies. Viral markers have been negative. Based on labs may consider further imaging of left upper quadrant fullness versus endoscopy as next step. Further recommendations to follow.  Patient requested that we relayed messages with her sister Octavia Heir due to patient's expressive aphasia.

## 2015-01-08 NOTE — Assessment & Plan Note (Signed)
Mild decline in hemoglobin of the past 9 months. Heme negative 1. No overt GI bleeding. Last colonoscopy 6 years ago. No prior upper endoscopy. Cannot exclude anemia of chronic disease. Iron normal.  Recheck for occult GI bleed with I FOBT. Check iron/TIBC, repeat ferritin. Further recommendations to follow.

## 2015-01-08 NOTE — Patient Instructions (Signed)
1. Please have your labs done.  2. Please collect stool specimen and return to our office.

## 2015-01-13 ENCOUNTER — Ambulatory Visit (INDEPENDENT_AMBULATORY_CARE_PROVIDER_SITE_OTHER): Payer: Medicare Other

## 2015-01-13 DIAGNOSIS — D649 Anemia, unspecified: Secondary | ICD-10-CM | POA: Diagnosis not present

## 2015-01-13 LAB — IFOBT (OCCULT BLOOD): IFOBT: NEGATIVE

## 2015-01-13 NOTE — Progress Notes (Signed)
Pt returned IFOBT test and it was NEGATIVE 

## 2015-01-14 DIAGNOSIS — D649 Anemia, unspecified: Secondary | ICD-10-CM | POA: Diagnosis not present

## 2015-01-14 DIAGNOSIS — R7989 Other specified abnormal findings of blood chemistry: Secondary | ICD-10-CM | POA: Diagnosis not present

## 2015-01-14 DIAGNOSIS — R740 Nonspecific elevation of levels of transaminase and lactic acid dehydrogenase [LDH]: Secondary | ICD-10-CM | POA: Diagnosis not present

## 2015-01-14 DIAGNOSIS — R198 Other specified symptoms and signs involving the digestive system and abdomen: Secondary | ICD-10-CM | POA: Diagnosis not present

## 2015-01-14 DIAGNOSIS — R799 Abnormal finding of blood chemistry, unspecified: Secondary | ICD-10-CM | POA: Diagnosis not present

## 2015-01-15 LAB — IGG, IGA, IGM
IgA: 560 mg/dL — ABNORMAL HIGH (ref 69–380)
IgG (Immunoglobin G), Serum: 1230 mg/dL (ref 690–1700)
IgM, Serum: 64 mg/dL (ref 52–322)

## 2015-01-15 LAB — IRON AND TIBC
%SAT: 54 % — ABNORMAL HIGH (ref 11–50)
Iron: 101 ug/dL (ref 45–160)
TIBC: 188 ug/dL — ABNORMAL LOW (ref 250–450)
UIBC: 87 ug/dL — ABNORMAL LOW (ref 125–400)

## 2015-01-15 LAB — CREATININE, SERUM: Creat: 0.68 mg/dL (ref 0.50–1.05)

## 2015-01-15 LAB — FERRITIN: Ferritin: 1123 ng/mL — ABNORMAL HIGH (ref 10–291)

## 2015-01-15 NOTE — Progress Notes (Signed)
Quick Note:  Heme negative. Await final lab results. ______

## 2015-01-16 LAB — ANTI-NUCLEAR AB-TITER (ANA TITER): ANA Titer 1: 1:40 {titer} — ABNORMAL HIGH

## 2015-01-16 LAB — ANA: Anti Nuclear Antibody(ANA): POSITIVE — AB

## 2015-01-17 LAB — MITOCHONDRIAL/SMOOTH MUSCLE AB PNL
Mitochondrial M2 Ab, IgG: 0.21 (ref <0.91)
Smooth Muscle Ab: 22 U — ABNORMAL HIGH (ref <20)

## 2015-01-18 LAB — HEMOCHROMATOSIS DNA-PCR(C282Y,H63D)

## 2015-01-20 NOTE — Progress Notes (Signed)
Quick Note:  Mildly elevated ANA, smooth muscle antibody unclear significance at this point.  Ferritin remains significantly elevated but hemochromatosis markers negative.   Needs CT A/P with contrast as next step Due to LUQ mass, elevated LFTs, elevated ferritin, anemia ______

## 2015-01-23 ENCOUNTER — Other Ambulatory Visit: Payer: Self-pay

## 2015-01-23 DIAGNOSIS — R945 Abnormal results of liver function studies: Secondary | ICD-10-CM

## 2015-01-23 DIAGNOSIS — R7989 Other specified abnormal findings of blood chemistry: Secondary | ICD-10-CM

## 2015-01-23 DIAGNOSIS — D649 Anemia, unspecified: Secondary | ICD-10-CM

## 2015-01-23 DIAGNOSIS — R1902 Left upper quadrant abdominal swelling, mass and lump: Secondary | ICD-10-CM

## 2015-01-30 ENCOUNTER — Other Ambulatory Visit: Payer: Self-pay | Admitting: Family Medicine

## 2015-01-31 ENCOUNTER — Ambulatory Visit (HOSPITAL_COMMUNITY)
Admission: RE | Admit: 2015-01-31 | Discharge: 2015-01-31 | Disposition: A | Discharge status: Home / Self Care | Payer: Medicare Other | Source: Ambulatory Visit | Attending: Gastroenterology | Admitting: Gastroenterology

## 2015-01-31 DIAGNOSIS — R7989 Other specified abnormal findings of blood chemistry: Secondary | ICD-10-CM | POA: Insufficient documentation

## 2015-01-31 DIAGNOSIS — D649 Anemia, unspecified: Secondary | ICD-10-CM | POA: Insufficient documentation

## 2015-01-31 DIAGNOSIS — R945 Abnormal results of liver function studies: Secondary | ICD-10-CM

## 2015-01-31 DIAGNOSIS — I2510 Atherosclerotic heart disease of native coronary artery without angina pectoris: Secondary | ICD-10-CM | POA: Insufficient documentation

## 2015-01-31 DIAGNOSIS — R1902 Left upper quadrant abdominal swelling, mass and lump: Secondary | ICD-10-CM | POA: Diagnosis not present

## 2015-01-31 DIAGNOSIS — R1904 Left lower quadrant abdominal swelling, mass and lump: Secondary | ICD-10-CM | POA: Insufficient documentation

## 2015-01-31 DIAGNOSIS — K760 Fatty (change of) liver, not elsewhere classified: Secondary | ICD-10-CM | POA: Diagnosis not present

## 2015-01-31 MED ORDER — IOHEXOL 300 MG/ML  SOLN
100.0000 mL | Freq: Once | INTRAMUSCULAR | Status: AC | PRN
  Administered 2015-01-31: 100 mL via INTRAVENOUS

## 2015-01-31 MED ORDER — SODIUM CHLORIDE 0.9 % IJ SOLN
INTRAMUSCULAR | Status: AC
  Filled 2015-01-31: qty 45

## 2015-02-04 ENCOUNTER — Other Ambulatory Visit: Payer: Self-pay | Admitting: Family Medicine

## 2015-02-06 ENCOUNTER — Ambulatory Visit (HOSPITAL_COMMUNITY)
Admission: RE | Admit: 2015-02-06 | Discharge: 2015-02-06 | Disposition: A | Discharge status: Home / Self Care | Payer: Medicare Other | Source: Ambulatory Visit | Attending: Family Medicine | Admitting: Family Medicine

## 2015-02-06 ENCOUNTER — Telehealth: Payer: Self-pay | Admitting: Internal Medicine

## 2015-02-06 DIAGNOSIS — Z1231 Encounter for screening mammogram for malignant neoplasm of breast: Secondary | ICD-10-CM | POA: Insufficient documentation

## 2015-02-06 NOTE — Telephone Encounter (Signed)
Routing to [facility] for results.  

## 2015-02-06 NOTE — Telephone Encounter (Signed)
[phone]  Pt sister judy came in inquiring about ct results

## 2015-02-10 NOTE — Progress Notes (Signed)
Quick Note:  Pt's sister, [name], came by the office and was informed of the results. ______

## 2015-02-10 NOTE — Telephone Encounter (Signed)
See result note for CT. 

## 2015-02-10 NOTE — Progress Notes (Signed)
Quick Note:  Please let patient know her CT showed fatty liver and coronary artery calcifications in setting of known CAD.  I suspect her positive ANA and elevated ferritin due to her Rheumatoid Arthritis.  Smooth Muscle Ab: weakly positive, likely insignificant.  Recommend return OV with RMR (for anemia, elevated ferritin and +ANA/smooth muscle ab in setting of RA). Needs CBC, LFTs just prior to OV.  Please notify sister, Octavia Heir due to patient's expressive aphasia ______

## 2015-02-11 ENCOUNTER — Encounter: Payer: Self-pay | Admitting: Internal Medicine

## 2015-02-11 NOTE — Progress Notes (Signed)
APPT MADE AND LETTER SENT  °

## 2015-02-13 ENCOUNTER — Other Ambulatory Visit: Payer: Self-pay | Admitting: Gastroenterology

## 2015-02-13 DIAGNOSIS — D649 Anemia, unspecified: Secondary | ICD-10-CM

## 2015-02-13 DIAGNOSIS — R7401 Elevation of levels of liver transaminase levels: Secondary | ICD-10-CM

## 2015-02-13 DIAGNOSIS — R740 Nonspecific elevation of levels of transaminase and lactic acid dehydrogenase [LDH]: Secondary | ICD-10-CM

## 2015-02-19 DIAGNOSIS — Z79899 Other long term (current) drug therapy: Secondary | ICD-10-CM | POA: Diagnosis not present

## 2015-02-19 DIAGNOSIS — Z79891 Long term (current) use of opiate analgesic: Secondary | ICD-10-CM | POA: Diagnosis not present

## 2015-02-19 DIAGNOSIS — M0579 Rheumatoid arthritis with rheumatoid factor of multiple sites without organ or systems involvement: Secondary | ICD-10-CM | POA: Diagnosis not present

## 2015-02-24 ENCOUNTER — Other Ambulatory Visit: Payer: Self-pay

## 2015-02-24 ENCOUNTER — Telehealth: Payer: Self-pay

## 2015-02-24 DIAGNOSIS — D649 Anemia, unspecified: Secondary | ICD-10-CM

## 2015-02-24 DIAGNOSIS — R740 Nonspecific elevation of levels of transaminase and lactic acid dehydrogenase [LDH]: Secondary | ICD-10-CM

## 2015-02-24 DIAGNOSIS — R7401 Elevation of levels of liver transaminase levels: Secondary | ICD-10-CM

## 2015-02-24 NOTE — Telephone Encounter (Signed)
Opened in error

## 2015-03-05 ENCOUNTER — Other Ambulatory Visit: Payer: Self-pay | Admitting: Cardiology

## 2015-03-05 ENCOUNTER — Other Ambulatory Visit: Payer: Self-pay | Admitting: Family Medicine

## 2015-03-12 DIAGNOSIS — D649 Anemia, unspecified: Secondary | ICD-10-CM | POA: Diagnosis not present

## 2015-03-12 DIAGNOSIS — R740 Nonspecific elevation of levels of transaminase and lactic acid dehydrogenase [LDH]: Secondary | ICD-10-CM | POA: Diagnosis not present

## 2015-03-13 LAB — HEPATIC FUNCTION PANEL
ALT: 31 U/L — ABNORMAL HIGH (ref 6–29)
AST: 33 U/L (ref 10–35)
Albumin: 3.5 g/dL — ABNORMAL LOW (ref 3.6–5.1)
Alkaline Phosphatase: 84 U/L (ref 33–130)
Bilirubin, Direct: 0.1 mg/dL (ref <=0.2)
Indirect Bilirubin: 0.2 mg/dL (ref 0.2–1.2)
Total Bilirubin: 0.3 mg/dL (ref 0.2–1.2)
Total Protein: 7 g/dL (ref 6.1–8.1)

## 2015-03-13 LAB — CBC WITH DIFFERENTIAL/PLATELET
Basophils Absolute: 0 10*3/uL (ref 0.0–0.1)
Basophils Relative: 0 % (ref 0–1)
Eosinophils Absolute: 0.1 10*3/uL (ref 0.0–0.7)
Eosinophils Relative: 1 % (ref 0–5)
HCT: 36.5 % (ref 36.0–46.0)
Hemoglobin: 11.8 g/dL — ABNORMAL LOW (ref 12.0–15.0)
Lymphocytes Relative: 28 % (ref 12–46)
Lymphs Abs: 2.1 10*3/uL (ref 0.7–4.0)
MCH: 32.3 pg (ref 26.0–34.0)
MCHC: 32.3 g/dL (ref 30.0–36.0)
MCV: 100 fL (ref 78.0–100.0)
MPV: 10.7 fL (ref 8.6–12.4)
Monocytes Absolute: 0.9 10*3/uL (ref 0.1–1.0)
Monocytes Relative: 12 % (ref 3–12)
Neutro Abs: 4.4 10*3/uL (ref 1.7–7.7)
Neutrophils Relative %: 59 % (ref 43–77)
Platelets: 232 10*3/uL (ref 150–400)
RBC: 3.65 MIL/uL — ABNORMAL LOW (ref 3.87–5.11)
RDW: 14.5 % (ref 11.5–15.5)
WBC: 7.5 10*3/uL (ref 4.0–10.5)

## 2015-03-15 NOTE — Progress Notes (Signed)
Quick Note:  Hgb slightly better. ALT slightly elevated better to stable. Keep upcoming OV with RMR. ______

## 2015-03-18 ENCOUNTER — Encounter: Payer: Self-pay | Admitting: Internal Medicine

## 2015-03-18 ENCOUNTER — Ambulatory Visit (INDEPENDENT_AMBULATORY_CARE_PROVIDER_SITE_OTHER): Payer: Medicare Other | Admitting: Internal Medicine

## 2015-03-18 VITALS — BP 142/76 | HR 80 | Temp 97.6°F | Ht 65.0 in | Wt 149.4 lb

## 2015-03-18 DIAGNOSIS — R7989 Other specified abnormal findings of blood chemistry: Secondary | ICD-10-CM | POA: Diagnosis not present

## 2015-03-18 DIAGNOSIS — D62 Acute posthemorrhagic anemia: Secondary | ICD-10-CM

## 2015-03-18 DIAGNOSIS — I2510 Atherosclerotic heart disease of native coronary artery without angina pectoris: Secondary | ICD-10-CM

## 2015-03-18 DIAGNOSIS — R748 Abnormal levels of other serum enzymes: Secondary | ICD-10-CM | POA: Diagnosis not present

## 2015-03-18 NOTE — Patient Instructions (Signed)
Stop iron supplemement  Repeat Iron, IBC, ferritin, cbc, lfts in 3 months  Office visit 3 months just after labs done

## 2015-03-18 NOTE — Progress Notes (Signed)
Primary Care Physician:  Tula Nakayama, MD Primary Gastroenterologist:  Dr. Gala Romney  Pre-Procedure History & Physical: HPI:  Danielle Cruz is a 59 y.o. female here for followup of mildly elevated aminotransferases. Mildly elevated autoimmune markers,  markedly elevated ferritin just over a  thousand and elevated serum iron. She's been on iron supplementation for some time.  She continues to take his medication.   Most recent labs demonstrated only minimal elevation - improved. H&H almost normal ( hemoglobin of 11.8.).  Left upper quadrant fullness appreciated on physical exam last visit followed up with CT  -  revealed chronic finding of coronary artery disease but no abnormality in the left upper quadrant. Fatty-appearing liver. Continues to take statin therapy.  Past Medical History  Diagnosis Date  . Arteriosclerotic cardiovascular disease (ASCVD) 07/2003    DES to the LAD and the BMS to the OM1- normal  EF  . Hyperlipidemia   . Hypertension 2005  . Tobacco abuse, in remission     Quit in 2010; 30-pack-year history  . CVA (cerebral infarction) 08/2008    sizable left -residual expressive aphasia, right sided weakness; ambulates with difficulty with the right leg brace   . Rheumatoid arthritis(714.0)   . Colonic polyp 2010    Hemorrhoids; h/o mild hematochezia    Past Surgical History  Procedure Laterality Date  . Ankle surgery      Right  . Oophorectomy    . Colonoscopy w/ polypectomy  11/2008    Dr. Gala Romney. Left-sided diverticula, Pedunculated polyp snared (no adenomatous changes)    Prior to Admission medications   Medication Sig Start Date End Date Taking? Authorizing Provider  aspirin EC 81 MG tablet Take 81 mg by mouth daily.   Yes Historical Provider, MD  atorvastatin (LIPITOR) 80 MG tablet TAKE 1 TABLET(80 MG) BY MOUTH DAILY 03/06/15  Yes Fayrene Helper, MD  chlorthalidone (HYGROTON) 25 MG tablet TAKE 1/2 TABLET BY MOUTH EVERY DAY 12/18/14  Yes Arnoldo Lenis, MD    Cholecalciferol (VITAMIN D) 400 UNITS capsule Take 400 Units by mouth daily. Take 1 tablet by mouth daily    Yes Historical Provider, MD  FERROUS GLUCONATE IRON PO Take 325 mg by mouth daily.    Yes Historical Provider, MD  HYDROcodone-acetaminophen [facility]) 10-325 MG per tablet  07/10/14  Yes Historical Provider, MD  leflunomide (ARAVA) 20 MG tablet Take 20 mg by mouth daily.     Yes Historical Provider, MD  metoprolol succinate (TOPROL-XL) 25 MG 24 hr tablet TAKE 1 TABLET BY MOUTH EVERY DAY 10/28/14  Yes Arnoldo Lenis, MD  oxybutynin (DITROPAN) 5 MG tablet TAKE 1/2 TABLET(2.5 MG) BY MOUTH TWICE DAILY 01/30/15  Yes Fayrene Helper, MD  potassium chloride SA (K-DUR,KLOR-CON) 20 MEQ tablet TAKE 1 TABLET BY MOUTH EVERY DAY 11/07/14  Yes Arnoldo Lenis, MD  spironolactone (ALDACTONE) 25 MG tablet TAKE 1 TABLET(25 MG) BY MOUTH DAILY 03/06/15  Yes Arnoldo Lenis, MD  atorvastatin (LIPITOR) 80 MG tablet TAKE 1 TABLET(80 MG) BY MOUTH DAILY Patient not taking: Reported on 03/18/2015 02/04/15   Fayrene Helper, MD    Allergies as of 03/18/2015 - Review Complete 03/18/2015  Allergen Reaction Noted  . Ace inhibitors Cough 11/18/2014    Family History  Problem Relation Age of Onset  . Cancer Father     prostate, deceased 55s  . Breast cancer Sister     Deceased 82s  . Cancer Brother     lymphoma  .  Heart attack Mother     deceased 34, during childbirth  . Colon cancer Maternal Aunt     older than age 37  . Liver disease Neg Hx     Social History   Social History  . Marital Status: Married    Spouse Name: N/A  . Number of Children: 3  . Years of Education: N/A   Occupational History  . disabilty x 9years     secondary to arthritis    Social History Main Topics  . Smoking status: Current Some Day Smoker    Types: Cigarettes  . Smokeless tobacco: Not on file  . Alcohol Use: No     Comment: quit 6 years ago   . Drug Use: No  . Sexual Activity: Not on file   Other  Topics Concern  . Not on file   Social History Narrative   Pt gave birth to 4 children, 1 deceased at 18 weeks was a premature baby, 3 are living ages range 83 to 56 all healthy     Review of Systems: See HPI, otherwise negative ROS  Physical Exam: BP 142/76 mmHg  Pulse 80  Temp(Src) 97.6 F (36.4 C) (Oral)  Ht 5\' 5"  (1.651 m)  Wt 149 lb 6.4 oz (67.767 kg)  BMI 24.86 kg/m2 General:   Alert,  Well-developed, well-nourished, pleasant and cooperative in NAD. Examined in wheelchair because of decreased mobility. Skin:  Intact without significant lesions or rashes. Eyes:  Sclera clear, no icterus.   Conjunctiva pink. Ears:  Normal auditory acuity. Nose:  No deformity, discharge,  or lesions. Mouth:  No deformity or lesions. Neck:  Supple; no masses or thyromegaly. No significant cervical adenopathy. Lungs:  Clear throughout to auscultation.   No wheezes, crackles, or rhonchi. No acute distress. Heart:  Regular rate and rhythm; no murmurs, clicks, rubs,  or gallops. Abdomen: Non-distended, normal bowel sounds.  Soft and nontender without appreciable mass or hepatosplenomegaly.  Pulses:  Normal pulses noted. Extremities:  Without clubbing or edema.  Impression:  59 year old lady with a mildly elevated aminotransferases- much improved recently.  H&H also improved. Elevated ferritin and iron saturation. Likely has anemia of chronic disease. She has been taking iron supplementation for some time. This may have a major contributing factor to the ferritin and the iron levels. Mildly elevated aminotransferases nonspecific-could easily be explained by statin effect( this would not be a reason to withhold statin therapy, however). Hemoccult negative.  Recommendations:   Stop iron supplemement  Repeat Iron, IBC, ferritin, cbc, lfts in 3 months  Office visit 3 months just after labs done       Notice: This dictation was prepared with Dragon dictation along with smaller phrase technology.  Any transcriptional errors that result from this process are unintentional and may not be corrected upon review.

## 2015-03-20 ENCOUNTER — Other Ambulatory Visit: Payer: Self-pay | Admitting: Family Medicine

## 2015-03-20 ENCOUNTER — Ambulatory Visit: Payer: Medicare Other | Admitting: Family Medicine

## 2015-03-25 ENCOUNTER — Other Ambulatory Visit: Payer: Self-pay

## 2015-03-25 DIAGNOSIS — D649 Anemia, unspecified: Secondary | ICD-10-CM

## 2015-04-24 ENCOUNTER — Other Ambulatory Visit: Payer: Self-pay

## 2015-04-24 DIAGNOSIS — D649 Anemia, unspecified: Secondary | ICD-10-CM

## 2015-05-10 ENCOUNTER — Other Ambulatory Visit: Payer: Self-pay | Admitting: Cardiology

## 2015-05-13 ENCOUNTER — Encounter: Payer: Self-pay | Admitting: Internal Medicine

## 2015-05-15 DIAGNOSIS — Z79891 Long term (current) use of opiate analgesic: Secondary | ICD-10-CM | POA: Diagnosis not present

## 2015-05-15 DIAGNOSIS — Z79899 Other long term (current) drug therapy: Secondary | ICD-10-CM | POA: Diagnosis not present

## 2015-05-15 DIAGNOSIS — M0579 Rheumatoid arthritis with rheumatoid factor of multiple sites without organ or systems involvement: Secondary | ICD-10-CM | POA: Diagnosis not present

## 2015-07-02 ENCOUNTER — Other Ambulatory Visit: Payer: Self-pay | Admitting: Family Medicine

## 2015-07-23 ENCOUNTER — Other Ambulatory Visit: Payer: Self-pay | Admitting: Cardiology

## 2015-08-14 DIAGNOSIS — Z79891 Long term (current) use of opiate analgesic: Secondary | ICD-10-CM | POA: Diagnosis not present

## 2015-08-14 DIAGNOSIS — R609 Edema, unspecified: Secondary | ICD-10-CM | POA: Diagnosis not present

## 2015-08-14 DIAGNOSIS — M0579 Rheumatoid arthritis with rheumatoid factor of multiple sites without organ or systems involvement: Secondary | ICD-10-CM | POA: Diagnosis not present

## 2015-08-14 DIAGNOSIS — Z79899 Other long term (current) drug therapy: Secondary | ICD-10-CM | POA: Diagnosis not present

## 2015-09-29 ENCOUNTER — Other Ambulatory Visit: Payer: Self-pay | Admitting: Cardiology

## 2015-10-10 ENCOUNTER — Encounter: Payer: Self-pay | Admitting: Cardiology

## 2015-10-27 ENCOUNTER — Other Ambulatory Visit: Payer: Self-pay | Admitting: Family Medicine

## 2015-11-04 ENCOUNTER — Ambulatory Visit: Payer: Medicare Other | Admitting: Cardiology

## 2015-11-10 ENCOUNTER — Encounter (INDEPENDENT_AMBULATORY_CARE_PROVIDER_SITE_OTHER): Payer: Self-pay

## 2015-11-10 ENCOUNTER — Encounter: Payer: Self-pay | Admitting: Cardiology

## 2015-11-10 ENCOUNTER — Ambulatory Visit (INDEPENDENT_AMBULATORY_CARE_PROVIDER_SITE_OTHER): Payer: Medicare Other | Admitting: Cardiology

## 2015-11-10 VITALS — BP 116/68 | HR 56 | Ht 67.0 in | Wt 153.0 lb

## 2015-11-10 DIAGNOSIS — Z79899 Other long term (current) drug therapy: Secondary | ICD-10-CM | POA: Diagnosis not present

## 2015-11-10 DIAGNOSIS — I2510 Atherosclerotic heart disease of native coronary artery without angina pectoris: Secondary | ICD-10-CM

## 2015-11-10 DIAGNOSIS — I10 Essential (primary) hypertension: Secondary | ICD-10-CM | POA: Diagnosis not present

## 2015-11-10 DIAGNOSIS — E785 Hyperlipidemia, unspecified: Secondary | ICD-10-CM

## 2015-11-10 DIAGNOSIS — R011 Cardiac murmur, unspecified: Secondary | ICD-10-CM

## 2015-11-10 DIAGNOSIS — R600 Localized edema: Secondary | ICD-10-CM

## 2015-11-10 MED ORDER — FUROSEMIDE 40 MG PO TABS: 40.0000 mg | ORAL_TABLET | Freq: Every day | ORAL | 2 refills | Status: DC

## 2015-11-10 NOTE — Patient Instructions (Addendum)
Medication Instructions:  Start lasix 40 mg daily for next 3 days and then take only AS NEEDED FOR SWELLING   Labwork: Your physician recommends that you return for lab work in: [place]  Testing/Procedures: Your physician has requested that you have an echocardiogram. Echocardiography is a painless test that uses sound waves to create images of your heart. It provides your doctor with information about the size and shape of your heart and how well your heart's chambers and valves are working. This procedure takes approximately one hour. There are no restrictions for this procedure.    Follow-Up: Your physician recommends that you schedule a follow-up appointment in: 1 MONTH    Any Other Special Instructions Will Be Listed Below (If Applicable).     If you need a refill on your cardiac medications before your next appointment, please call your pharmacy.

## 2015-11-10 NOTE — Progress Notes (Addendum)
Clinical Summary Ms. Preast is a 60 y.o.female seen today for follow up of the following medical problems.   1. CAD - prior stenting in 2005 to LAD and OM - echo 08/2008 LVEF 55-65%, no WMAs  - no chest pain, no SOB or DOE since last visit.  - compliant with meds  2. Hx of CVA - aphasic, on ASA and statin for secondary prevention  3. Hyperlipidemia - lipid panel 1.2016 TC 142 TG 166 HDL 32 LDL 77 - mildly elevated LFTs followed by GI, continued on statin.   4. HTN - does not check at home - compliant with meds   5. Heart murmur - she did not complete her echo since last visit   Past Medical History:  Diagnosis Date  . 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 Colonic polyp 2010   Hemorrhoids; h/o mild hematochezia  . CVA (cerebral infarction) 08/2008   sizable left -residual expressive aphasia, right sided weakness; ambulates with difficulty with the right leg brace   . Hyperlipidemia   . Hypertension 2005  . Rheumatoid arthritis(714.0)   . Tobacco abuse, in remission    Quit in 2010; 30-pack-year history     Allergies  Allergen Reactions  . Ace Inhibitors Cough    New daily cough since starting ACE     Current Outpatient Prescriptions  Medication Sig Dispense Refill  . aspirin EC 81 MG tablet Take 81 mg by mouth daily.    Marland Kitchen atorvastatin (LIPITOR) 80 MG tablet TAKE 1 TABLET(80 MG) BY MOUTH DAILY (Patient not taking: Reported on 03/18/2015) 90 tablet 1  . atorvastatin (LIPITOR) 80 MG tablet TAKE 1 TABLET(80 MG) BY MOUTH DAILY 30 tablet 3  . chlorthalidone (HYGROTON) 25 MG tablet TAKE 1/2 TABLET BY MOUTH EVERY DAY 60 tablet 3  . Cholecalciferol (VITAMIN D) 400 UNITS capsule Take 400 Units by mouth daily. Take 1 tablet by mouth daily     . FERROUS GLUCONATE IRON PO Take 325 mg by mouth daily.     Marland Kitchen HYDROcodone-acetaminophen (NORCO) 10-325 MG per tablet   0  . leflunomide (ARAVA) 20 MG  tablet Take 20 mg by mouth daily.      . metoprolol succinate (TOPROL-XL) 25 MG 24 hr tablet TAKE 1 TABLET BY MOUTH EVERY DAY 30 tablet 6  . oxybutynin (DITROPAN) 5 MG tablet TAKE 1/2 TABLET(2.5 MG) BY MOUTH TWICE DAILY 90 tablet 1  . potassium chloride SA (K-DUR,KLOR-CON) 20 MEQ tablet TAKE 1 TABLET BY MOUTH EVERY DAY 90 tablet 3  . spironolactone (ALDACTONE) 25 MG tablet TAKE 1 TABLET(25 MG) BY MOUTH DAILY 30 tablet 6   No current facility-administered medications for this visit.      Past Surgical History:  Procedure Laterality Date  . ANKLE SURGERY     Right  . COLONOSCOPY W/ POLYPECTOMY  11/2008   Dr. Gala Romney. Left-sided diverticula, Pedunculated polyp snared (no adenomatous changes)  . OOPHORECTOMY       Allergies  Allergen Reactions  . Ace Inhibitors Cough    New daily cough since starting ACE      Family History  Problem Relation Age of Onset  . Cancer Father     prostate, deceased 42s  . Breast cancer Sister     Deceased 86s  . Cancer Brother     lymphoma  . Heart attack Mother     deceased 35, during childbirth  .  Colon cancer Maternal Aunt     older than age 13  . Liver disease Neg Hx      Social History Ms. Lartigue reports that she has been smoking Cigarettes.  She does not have any smokeless tobacco history on file. Ms. Civello reports that she does not drink alcohol.   Review of Systems CONSTITUTIONAL: No weight loss, fever, chills, weakness or fatigue.  HEENT: Eyes: No visual loss, blurred vision, double vision or yellow sclerae.No hearing loss, sneezing, congestion, runny nose or sore throat.  SKIN: No rash or itching.  CARDIOVASCULAR: per HPI RESPIRATORY: No shortness of breath, cough or sputum.  GASTROINTESTINAL: No anorexia, nausea, vomiting or diarrhea. No abdominal pain or blood.  GENITOURINARY: No burning on urination, no polyuria NEUROLOGICAL: No headache, dizziness, syncope, paralysis, ataxia, numbness or tingling in the extremities. No change  in bowel or bladder control.  MUSCULOSKELETAL: No muscle, back pain, joint pain or stiffness.  LYMPHATICS: No enlarged nodes. No history of splenectomy.  PSYCHIATRIC: No history of depression or anxiety.  ENDOCRINOLOGIC: No reports of sweating, cold or heat intolerance. No polyuria or polydipsia.  Marland Kitchen   Physical Examination Vitals:   11/10/15 1120  BP: 116/68  Pulse: (!) 56   Vitals:   11/10/15 1120  Weight: 153 lb (69.4 kg)  Height: 5\' 7"  (1.702 m)    Gen: resting comfortably, no acute distress HEENT: no scleral icterus, pupils equal round and reactive, no palptable cervical adenopathy,  CV: RRR, 2/6 systolic murmur RUSB, no jvd Resp: Clear to auscultation bilaterally GI: abdomen is soft, non-tender, non-distended, normal bowel sounds, no hepatosplenomegaly MSK: extremities are warm, 1-2+ bilateral LE edema Skin: warm, no rash Neuro:  no focal deficits Psych: appropriate affect   Diagnostic Studies 07/2003 Cath RESULTS:  HEMODYNAMICS:  1. Left ventricular pressure 145/12.  2. Aortic pressure 150/82.  3. There was no aortic valve gradient.  LEFT VENTRICULOGRAM: Wall motion is normal. Ejection fraction estimated at  greater than or equal to 65%. There is no mitral regurgitation.  CORONARY ARTERIOGRAPHY (CO-DOMINANT):  1. Left main is normal.  2. Left anterior descending artery has a tubular 20% stenosis in the  proximal to mid vessel. The distal LAD has a tubular 80% stenosis. The  apical LAD has a tubular 80% stenosis. The LAD gives rise to a small  diagonal Rickard Kennerly.  3. The left circumflex was a large co-dominant vessel. It gives rise to a  large first obtuse marginal Matthe Sloane. There is a 90% stenosis in the mid  portion of the first obtuse marginal Jerimy Johanson. There is haziness  associated with this lesion, and it has the appearance of a positive  ruptured plaque. There is a normal sized second obtuse marginal Dasia Guerrier,  which has a 50% stenosis proximally  and a 60% stenosis distally. The  distal circumflex also gives rise to 2 small posterolateral branches.  4. The right coronary artery is a relatively small co-dominant vessel.  There is diffuse 60% stenosis in the proximal vessel. In the mid vessel  at the acute margin, there is a 20% stenosis and the distal vessel has a  20% stenosis. There is a small to normal sized acute marginal Krysta Bloomfield,  which has a 70% at its origin. The distal right coronary artery also  gives rise to a small posterior descending coronary artery and a small  posterolateral Meryl Ponder.  IMPRESSION:  1. Normal left ventricular systolic function.  2. Three-vessel coronary artery disease, as described. The culprit lesion  appears to be  the 90% stenosis in the first obtuse marginal Deniece Rankin.  There is also significant disease in the distal LAD and moderate, but  nonobstructive, disease in the small right coronary artery.  PLAN: Percutaneous intervention of the obtuse marginal and the LAD, see  below.  PTCA PROCEDURAL NOTE: Following completion of diagnostic catheterization,  we proceeded with percutaneous coronary intervention. We utilized the  preexisting #6 French sheath in the right femoral artery. Heparin and  Integrilin were administered per protocol. We used a #6 [place] LS 3.5  guiding catheter. A BMW wire was advanced under fluoroscopic guidance into  the distal portion of the first obtuse marginal Boone Gear. We then performed  PTCA of the 90% stenosis in the obtuse marginal with a 2.5 x 12 mm Quantum  balloon inflated to 10 atmospheres. We then positioned a 2.5 x 13 mm CYPHER  drug-eluting stent across the area of diseased vessel and deployed the stent  at 15 atmospheres. We then went back with a 2.5 x 12 mm Quantum balloon  within the stent and inflated this balloon to 22 atmospheres. The final  angiographic images were obtained revealing patency of the obtuse marginal  Memory Heinrichs with 0%  residual stenosis and TIMI 3 flow.  We then turned our attention to the LAD. The BMW wire was advanced under  fluoroscopic guidance into the apical portion of the LAD. We then performed  PTCA of the 80% stenosis in the distal LAD with a 2.0 x 15 mm Quantum  balloon inflated to 12 atmospheres. We then positioned a 2.0 x 15 mm Mini  Vision bare metal stent across the area of segment of disease and deployed  the stent at 12 atmospheres. We then went back with a 2.25 x 12 mm Quantum  balloon and positioning this within the stent, we inflated this balloon to  14 atmospheres in the proximal and distal edges of the stent. Final  angiographic images are obtained revealing patency of the LAD at the stent  site and with 0% residual stenosis and TIMI 3 flow.  COMPLICATIONS: None.  RESULTS:  1. Successful PTCA with placement of a drug-eluting stent in the first  obtuse marginal Luther Newhouse. A 90% stenosis with haziness was reduced to 0%  residual with TIMI 3 flow.  2. Successful PTCA with placement of a bare metal stent in the distal left  anterior descending artery. A tubular 80% stenosis was reduced to 0%  residual with TIMI flow.  PLAN: Integrilin will be continued for 18 hours. It is recommended that  the patient will be treated with Plavix for a minimum of 6-9 months. She  also needs aggressive risk factor modification. Regarding the residual  disease in the apical LAD, at this point, would recommend continue medical  therapy. If she has recurrent chest pain, which is refractor to medical  therapy, percutaneous coronary intervention of the apical LAD could be  considered.  08/2008 Echo Study Conclusions  1. Left ventricle: The cavity size was normal. Systolic function was normal. The estimated ejection fraction was in the range of 55% to 65%. Wall motion was normal; there were no regional wall motion abnormalities. 2. Aortic valve: Trivial regurgitation. 3. Mitral  valve: No evidence of vegetation. 4. Left atrium: No evidence of thrombus in the atrial cavity or appendage. 5. Atrial septum: Echo contrast study showed no right-to-left atrial level shunt, in the baseline state (Late bubbles felt to be nondiagnostic) There was an atrial septal aneurysm.       Assessment and  Plan   1. CAD - no current symptoms, continue current meds - EKG in clinic NSR, no ischemic changes  2. Hx of CVA - continue ASA and statin for secondary prevention  3. Hyperlipidemia - continue statin, she is on atorva 80mg  daily.    4. HTN - at goal, she will continue current meds  5. Heart murmur - we will reorder echo  6. LE edema - start lasix 40mg  daily x 3 days then prn daily. Check BMET and Mg in 2 weeks.  - f/u echo   Arnoldo Lenis, M.D.

## 2015-11-13 ENCOUNTER — Other Ambulatory Visit: Payer: Self-pay | Admitting: Family Medicine

## 2015-11-13 DIAGNOSIS — M0579 Rheumatoid arthritis with rheumatoid factor of multiple sites without organ or systems involvement: Secondary | ICD-10-CM | POA: Diagnosis not present

## 2015-11-13 DIAGNOSIS — Z79891 Long term (current) use of opiate analgesic: Secondary | ICD-10-CM | POA: Diagnosis not present

## 2015-11-13 DIAGNOSIS — R609 Edema, unspecified: Secondary | ICD-10-CM | POA: Diagnosis not present

## 2015-11-13 DIAGNOSIS — Z79899 Other long term (current) drug therapy: Secondary | ICD-10-CM | POA: Diagnosis not present

## 2015-11-18 ENCOUNTER — Ambulatory Visit (HOSPITAL_COMMUNITY): Payer: Medicare Other

## 2015-11-21 ENCOUNTER — Telehealth: Payer: Self-pay | Admitting: Family Medicine

## 2015-11-21 DIAGNOSIS — I10 Essential (primary) hypertension: Secondary | ICD-10-CM

## 2015-11-21 DIAGNOSIS — E785 Hyperlipidemia, unspecified: Secondary | ICD-10-CM

## 2015-11-21 DIAGNOSIS — R7301 Impaired fasting glucose: Secondary | ICD-10-CM

## 2015-11-21 NOTE — Telephone Encounter (Signed)
Danielle Cruz is scheduled for 12/02/15 and her son is aware she needs fasting labs before/ Brandi or Loma Sousa would you please make sure lab orders are placed/Thanks

## 2015-11-21 NOTE — Telephone Encounter (Signed)
All and schedule an appt for physical exam/pelvic and breast, has not been in for 1 year  Will also need fasting labs iwould prefer her to get prior appt, will flag nurse

## 2015-11-23 NOTE — Telephone Encounter (Signed)
Contact made with pt and appt scheduled and labs ordered

## 2015-11-24 NOTE — Telephone Encounter (Signed)
Labs ordered.  Will mail order to address on file.

## 2015-11-25 ENCOUNTER — Other Ambulatory Visit: Payer: Self-pay | Admitting: Family Medicine

## 2015-11-25 ENCOUNTER — Other Ambulatory Visit (HOSPITAL_COMMUNITY): Payer: Medicare Other

## 2015-11-25 DIAGNOSIS — R7301 Impaired fasting glucose: Secondary | ICD-10-CM | POA: Diagnosis not present

## 2015-11-25 DIAGNOSIS — D649 Anemia, unspecified: Secondary | ICD-10-CM | POA: Diagnosis not present

## 2015-11-25 DIAGNOSIS — E785 Hyperlipidemia, unspecified: Secondary | ICD-10-CM | POA: Diagnosis not present

## 2015-11-25 DIAGNOSIS — I10 Essential (primary) hypertension: Secondary | ICD-10-CM | POA: Diagnosis not present

## 2015-11-25 NOTE — Addendum Note (Signed)
Addended by: Denman George B on: 11/25/2015 01:50 PM   Modules accepted: Orders

## 2015-11-26 LAB — CBC
HCT: 34.6 % — ABNORMAL LOW (ref 35.0–45.0)
Hemoglobin: 11.2 g/dL — ABNORMAL LOW (ref 11.7–15.5)
MCH: 31.9 pg (ref 27.0–33.0)
MCHC: 32.4 g/dL (ref 32.0–36.0)
MCV: 98.6 fL (ref 80.0–100.0)
MPV: 11.1 fL (ref 7.5–12.5)
Platelets: 258 10*3/uL (ref 140–400)
RBC: 3.51 MIL/uL — ABNORMAL LOW (ref 3.80–5.10)
RDW: 15.9 % — ABNORMAL HIGH (ref 11.0–15.0)
WBC: 6.1 10*3/uL (ref 3.8–10.8)

## 2015-11-26 LAB — TSH: TSH: 1.77 mIU/L

## 2015-11-26 LAB — LIPID PANEL
Cholesterol: 143 mg/dL (ref 125–200)
HDL: 24 mg/dL — ABNORMAL LOW (ref >=46)
LDL Cholesterol: 64 mg/dL (ref <130)
Total CHOL/HDL Ratio: 6 Ratio — ABNORMAL HIGH (ref <=5.0)
Triglycerides: 273 mg/dL — ABNORMAL HIGH (ref <150)
VLDL: 55 mg/dL — ABNORMAL HIGH (ref <30)

## 2015-11-26 LAB — BASIC METABOLIC PANEL
BUN: 12 mg/dL (ref 7–25)
CO2: 25 mmol/L (ref 20–31)
Calcium: 9.1 mg/dL (ref 8.6–10.4)
Chloride: 104 mmol/L (ref 98–110)
Creat: 0.84 mg/dL (ref 0.50–0.99)
Glucose, Bld: 96 mg/dL (ref 65–99)
Potassium: 4.3 mmol/L (ref 3.5–5.3)
Sodium: 138 mmol/L (ref 135–146)

## 2015-11-26 LAB — MAGNESIUM: Magnesium: 1.7 mg/dL (ref 1.5–2.5)

## 2015-11-26 LAB — HEMOGLOBIN A1C
Hgb A1c MFr Bld: 5.7 % — ABNORMAL HIGH (ref <5.7)
Mean Plasma Glucose: 117 mg/dL

## 2015-11-27 LAB — FOLATE: Folate: >24 ng/mL (ref >5.4)

## 2015-11-27 LAB — HEPATIC FUNCTION PANEL
ALT: 31 U/L — ABNORMAL HIGH (ref 6–29)
AST: 36 U/L — ABNORMAL HIGH (ref 10–35)
Albumin: 3.8 g/dL (ref 3.6–5.1)
Alkaline Phosphatase: 91 U/L (ref 33–130)
Bilirubin, Direct: 0.1 mg/dL (ref <=0.2)
Indirect Bilirubin: 0.3 mg/dL (ref 0.2–1.2)
Total Bilirubin: 0.4 mg/dL (ref 0.2–1.2)
Total Protein: 7.1 g/dL (ref 6.1–8.1)

## 2015-11-27 LAB — FERRITIN: Ferritin: 1242 ng/mL — ABNORMAL HIGH (ref 20–288)

## 2015-11-27 LAB — IRON AND TIBC
%SAT: 47 % (ref 11–50)
Iron: 94 ug/dL (ref 45–160)
TIBC: 202 ug/dL — ABNORMAL LOW (ref 250–450)
UIBC: 108 ug/dL — ABNORMAL LOW (ref 125–400)

## 2015-11-27 LAB — VITAMIN D 25 HYDROXY (VIT D DEFICIENCY, FRACTURES): Vit D, 25-Hydroxy: 41 ng/mL (ref 30–100)

## 2015-11-27 LAB — VITAMIN B12: Vitamin B-12: 556 pg/mL (ref 200–1100)

## 2015-12-02 ENCOUNTER — Encounter: Payer: Self-pay | Admitting: Family Medicine

## 2015-12-02 ENCOUNTER — Ambulatory Visit (INDEPENDENT_AMBULATORY_CARE_PROVIDER_SITE_OTHER): Payer: Medicare Other | Admitting: Family Medicine

## 2015-12-02 VITALS — BP 150/78 | HR 90 | Resp 16 | Ht 67.0 in | Wt 152.0 lb

## 2015-12-02 DIAGNOSIS — Z0000 Encounter for general adult medical examination without abnormal findings: Secondary | ICD-10-CM | POA: Diagnosis not present

## 2015-12-02 DIAGNOSIS — I10 Essential (primary) hypertension: Secondary | ICD-10-CM

## 2015-12-02 DIAGNOSIS — Z720 Tobacco use: Secondary | ICD-10-CM

## 2015-12-02 DIAGNOSIS — E785 Hyperlipidemia, unspecified: Secondary | ICD-10-CM

## 2015-12-02 DIAGNOSIS — D649 Anemia, unspecified: Secondary | ICD-10-CM

## 2015-12-02 DIAGNOSIS — Z114 Encounter for screening for human immunodeficiency virus [HIV]: Secondary | ICD-10-CM

## 2015-12-02 NOTE — Progress Notes (Signed)
Preventive Screening-Counseling & Management   Patient present here today for a Medicare annual wellness visit.   Current Problems (verified)   Medications Prior to Visit Allergies (verified)   PAST HISTORY  Family History (updated)  Social History Currently disabled from a CVA. Husband is her caregiver, smokes occasionally. Wheelchair bound    Risk Factors  Current exercise habits:  Does chair exercises as able and stretching per husband   Dietary issues discussed: heart healthy, restrict fried fatty foods    Cardiac risk factors: nicotine use, CVA, vascular disease   Depression Screen  (Note: if answer to either of the following is "Yes", a more complete depression screening is indicated)   Over the past two weeks, have you felt down, depressed or hopeless? No  Over the past two weeks, have you felt little interest or pleasure in doing things? No  Have you lost interest or pleasure in daily life? No  Do you often feel hopeless? No  Do you cry easily over simple problems? No   Activities of Daily Living  In your present state of health, do you have any difficulty performing the following activities?  Driving?: yes, unable due to paralysis from CVA Managing money?: yes, by husband Feeding yourself?:No Getting from bed to chair?:yes due to hemiparesis Climbing a flight of stairs?:unable to do Preparing food and eating?:yes, unable  Bathing or showering?:needs assistance Getting dressed?:with assistance Getting to the toilet?:with assistance Using the toilet?:with assistance Moving around from place to place?: wheelchair bound   Fall Risk Assessment In the past year have you fallen or had a near fall?:No Are you currently taking any medications that make you dizziness?:No   Hearing Difficulties: No Do you often ask people to speak up or repeat themselves?:No Do you experience ringing or noises in your ears?:No Do you have difficulty understanding soft or whispered  voices?:No  Cognitive Testing  Alert? Yes Normal Appearance?Yes  Oriented to person? Non verbal due to CVA Place? Yes  Time? Non verbal  Displays appropriate judgment?unable to assess Can read the correct time from a watch face? unknown Are you having problems remembering things? Non verbal. Unable to assess  Advanced Directives have been discussed with the patient?Yes, brochure given    List the Names of Other Physician/Practitioners you currently use: updated   Indicate any recent Medical Services you may have received from other than [facility] providers in the past year (date may be approximate).     Medicare Attestation  I have personally reviewed:  The patient's medical and social history  Their use of alcohol, tobacco or illicit drugs  Their current medications and supplements  The patient's functional ability including ADLs,fall risks, home safety risks, cognitive, and hearing and visual impairment  Diet and physical activities  Evidence for depression or mood disorders  The patient's weight, height, BMI, and visual acuity have been recorded in the chart. I have made referrals, counseling, and provided education to the patient based on review of the above and I have provided the patient with a written personalized care plan for preventive services.    Physical Exam BP (!) 150/78   Pulse 90   Resp 16   Ht 5\' 7"  (1.702 m)   Wt 152 lb (68.9 kg)   SpO2 95%   BMI 23.81 kg/m    Assessment & Plan:  Medicare annual wellness visit, subsequent Annual exam as documented. Counseling done  re healthy lifestyle involving commitment to 150 minutes exercise per week, heart healthy diet, and  attaining healthy weight.The importance of adequate sleep also discussed. Regular seat belt use and home safety, is also discussed. Changes in health habits are decided on by the patient with goals and time frames  set for achieving them. Immunization and cancer screening needs are specifically  addressed at this visit.   Tobacco abuse Patient counseled for approximately 5 minutes regarding the health risks of ongoing nicotine use, specifically all types of cancer, heart disease, stroke and respiratory failure. The options available for help with cessation ,the behavioral changes to assist the process, and the option to either gradully reduce usage  Or abruptly stop.is also discussed. Pt is also encouraged to set specific goals in number of cigarettes used daily, as well as to set a quit date.     Hyperlipemia Uncontrolled Hyperlipidemia:Low fat diet discussed and encouraged.   Lipid Panel  Lab Results  Component Value Date   CHOL 143 11/25/2015   HDL 24 (L) 11/25/2015   LDLCALC 64 11/25/2015   TRIG 273 (H) 11/25/2015   CHOLHDL 6.0 (H) 11/25/2015     Updated lab needed at/ before next visit.   Anemia Persistent anemia for over 5 years, no specific deficient in iron , vit B or folate, refer to hematology, normal platelet and WBC

## 2015-12-02 NOTE — Patient Instructions (Addendum)
Pelvic and breast in early December   You are referred to hematologist due to mild anemia for years   PLEASE eat moe vegetable and fruit and beans, LESS fATTY fOODS  Please commit to exercise / dance to music in chair twice daily   PLS get shingles vaccine at pharmaccine  Thank you  for choosing [facility]. We consider it a privelige to serve you.  Delivering excellent health care in a caring and  compassionate way is our goal.  Partnering with you,  so that together we can achieve this goal is our strategy.  Fasting lipid, cmp and EGFR 1 week before appt

## 2015-12-02 NOTE — Assessment & Plan Note (Signed)
Uncontrolled Hyperlipidemia:Low fat diet discussed and encouraged.   Lipid Panel  Lab Results  Component Value Date   CHOL 143 11/25/2015   HDL 24 (L) 11/25/2015   LDLCALC 64 11/25/2015   TRIG 273 (H) 11/25/2015   CHOLHDL 6.0 (H) 11/25/2015     Updated lab needed at/ before next visit.

## 2015-12-02 NOTE — Assessment & Plan Note (Signed)

## 2015-12-02 NOTE — Assessment & Plan Note (Signed)
Persistent anemia for over 5 years, no specific deficient in iron , vit B or folate, refer to hematology, normal platelet and WBC

## 2015-12-02 NOTE — Assessment & Plan Note (Signed)

## 2015-12-05 ENCOUNTER — Other Ambulatory Visit (HOSPITAL_COMMUNITY): Payer: Medicare Other

## 2015-12-12 ENCOUNTER — Other Ambulatory Visit (HOSPITAL_COMMUNITY): Payer: Medicare Other

## 2015-12-17 ENCOUNTER — Ambulatory Visit (HOSPITAL_COMMUNITY)
Admission: RE | Admit: 2015-12-17 | Discharge: 2015-12-17 | Disposition: A | Discharge status: Home / Self Care | Payer: Medicare Other | Source: Ambulatory Visit | Attending: Cardiology | Admitting: Cardiology

## 2015-12-17 DIAGNOSIS — E785 Hyperlipidemia, unspecified: Secondary | ICD-10-CM | POA: Diagnosis not present

## 2015-12-17 DIAGNOSIS — I119 Hypertensive heart disease without heart failure: Secondary | ICD-10-CM | POA: Diagnosis not present

## 2015-12-17 DIAGNOSIS — R011 Cardiac murmur, unspecified: Secondary | ICD-10-CM | POA: Diagnosis not present

## 2015-12-17 DIAGNOSIS — Z8673 Personal history of transient ischemic attack (TIA), and cerebral infarction without residual deficits: Secondary | ICD-10-CM | POA: Diagnosis not present

## 2015-12-17 DIAGNOSIS — Z720 Tobacco use: Secondary | ICD-10-CM | POA: Insufficient documentation

## 2015-12-17 DIAGNOSIS — I2510 Atherosclerotic heart disease of native coronary artery without angina pectoris: Secondary | ICD-10-CM | POA: Insufficient documentation

## 2015-12-17 NOTE — Progress Notes (Signed)
*  PRELIMINARY RESULTS* Echocardiogram 2D Echocardiogram has been performed.  Danielle Cruz 12/17/2015, 9:24 AM

## 2015-12-19 ENCOUNTER — Telehealth: Payer: Self-pay

## 2015-12-19 NOTE — Telephone Encounter (Signed)
Called pt.. Voicemail full- not able to leave message. Will mail letter.

## 2015-12-19 NOTE — Telephone Encounter (Signed)
-----   Message from Arnoldo Lenis, MD sent at 12/19/2015  9:34 AM EDT ----- Echo looks good, heart function is overall normal  Zandra Abts MD

## 2015-12-23 ENCOUNTER — Encounter: Payer: Self-pay | Admitting: Cardiology

## 2015-12-23 ENCOUNTER — Ambulatory Visit (HOSPITAL_COMMUNITY): Payer: Medicare Other

## 2015-12-23 ENCOUNTER — Ambulatory Visit (INDEPENDENT_AMBULATORY_CARE_PROVIDER_SITE_OTHER): Payer: Medicare Other | Admitting: Cardiology

## 2015-12-23 VITALS — BP 152/70 | HR 84 | Ht 67.0 in | Wt 152.0 lb

## 2015-12-23 DIAGNOSIS — I10 Essential (primary) hypertension: Secondary | ICD-10-CM

## 2015-12-23 DIAGNOSIS — E785 Hyperlipidemia, unspecified: Secondary | ICD-10-CM

## 2015-12-23 DIAGNOSIS — I2510 Atherosclerotic heart disease of native coronary artery without angina pectoris: Secondary | ICD-10-CM | POA: Diagnosis not present

## 2015-12-23 NOTE — Progress Notes (Signed)
Clinical Summary Danielle Cruz is a 60 y.o.female seen today for follow up of the following medical problems.   1. CAD - prior stenting in 2005 to LAD and OM - echo 08/2008 LVEF 55-65%, no WMAs  - no chest pain, no SOB or DOE since last visit.  - compliant with meds  2. Hx of CVA - aphasic, has been on ASA and statin for secondary prevention  3. Hyperlipidemia - lipid panel 1.2016 TC 142 TG 166 HDL 32 LDL 77 - compliant with statin   4. HTN - does not check at home - compliant with meds  -ACE-I recently stopped by pcp for cough  5. LE edema - improved with lasix  Past Medical History:  Diagnosis Date  . 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 Colonic polyp 2010   Hemorrhoids; h/o mild hematochezia  . CVA (cerebral infarction) 08/2008   sizable left -residual expressive aphasia, right sided weakness; ambulates with difficulty with the right leg brace   . Hyperlipidemia   . Hypertension 2005  . Rheumatoid arthritis(714.0)   . Tobacco abuse, in remission    Quit in 2010; 30-pack-year history     Allergies  Allergen Reactions  . Ace Inhibitors Cough    New daily cough since starting ACE     Current Outpatient Prescriptions  Medication Sig Dispense Refill  . aspirin EC 81 MG tablet Take 81 mg by mouth daily.    Marland Kitchen atorvastatin (LIPITOR) 80 MG tablet Take 80 mg by mouth daily.    . chlorthalidone (HYGROTON) 25 MG tablet TAKE 1/2 TABLET BY MOUTH EVERY DAY 60 tablet 3  . Cholecalciferol (VITAMIN D) 400 UNITS capsule Take 400 Units by mouth daily. Take 1 tablet by mouth daily     . FERROUS GLUCONATE IRON PO Take 325 mg by mouth daily.     . furosemide (LASIX) 40 MG tablet Take 1 tablet (40 mg total) by mouth daily. 30 tablet 2  . HYDROcodone-acetaminophen (NORCO) 10-325 MG per tablet   0  . leflunomide (ARAVA) 20 MG tablet Take 20 mg by mouth daily.      . metoprolol succinate (TOPROL-XL) 25  MG 24 hr tablet TAKE 1 TABLET BY MOUTH EVERY DAY 30 tablet 6  . oxybutynin (DITROPAN) 5 MG tablet TAKE 1/2 TABLET(2.5 MG) BY MOUTH TWICE DAILY 90 tablet 2  . potassium chloride SA (K-DUR,KLOR-CON) 20 MEQ tablet TAKE 1 TABLET BY MOUTH EVERY DAY 90 tablet 3  . spironolactone (ALDACTONE) 25 MG tablet TAKE 1 TABLET(25 MG) BY MOUTH DAILY 30 tablet 6   No current facility-administered medications for this visit.      Past Surgical History:  Procedure Laterality Date  . ANKLE SURGERY     Right  . COLONOSCOPY W/ POLYPECTOMY  11/2008   Dr. Gala Romney. Left-sided diverticula, Pedunculated polyp snared (no adenomatous changes)  . OOPHORECTOMY       Allergies  Allergen Reactions  . Ace Inhibitors Cough    New daily cough since starting ACE      Family History  Problem Relation Age of Onset  . Cancer Father     prostate, deceased 42s  . Breast cancer Sister     Deceased 28s  . Heart attack Mother     deceased 52, during childbirth  . Cancer Brother     lymphoma  . Colon cancer Maternal Aunt     older  than age 50  . Liver disease Neg Hx      Social History Ms. Sumida reports that she has been smoking Cigarettes.  She has never used smokeless tobacco. Ms. Kalin reports that she does not drink alcohol.   Review of Systems CONSTITUTIONAL: No weight loss, fever, chills, weakness or fatigue.  HEENT: Eyes: No visual loss, blurred vision, double vision or yellow sclerae.No hearing loss, sneezing, congestion, runny nose or sore throat.  SKIN: No rash or itching.  CARDIOVASCULAR: per HPI RESPIRATORY: No shortness of breath, cough or sputum.  GASTROINTESTINAL: No anorexia, nausea, vomiting or diarrhea. No abdominal pain or blood.  GENITOURINARY: No burning on urination, no polyuria NEUROLOGICAL: No headache, dizziness, syncope, paralysis, ataxia, numbness or tingling in the extremities. No change in bowel or bladder control.  MUSCULOSKELETAL: No muscle, back pain, joint pain or stiffness.    LYMPHATICS: No enlarged nodes. No history of splenectomy.  PSYCHIATRIC: No history of depression or anxiety.  ENDOCRINOLOGIC: No reports of sweating, cold or heat intolerance. No polyuria or polydipsia.  Marland Kitchen   Physical Examination Vitals:   12/23/15 0825  BP: (!) 152/70  Pulse: 84   Vitals:   12/23/15 0825  Weight: 152 lb (68.9 kg)  Height: 5\' 7"  (1.702 m)    Gen: resting comfortably, no acute distress HEENT: no scleral icterus, pupils equal round and reactive, no palptable cervical adenopathy,  CV: RRR, no m/r/g no jvd Resp: Clear to auscultation bilaterally GI: abdomen is soft, non-tender, non-distended, normal bowel sounds, no hepatosplenomegaly MSK: extremities are warm, 1+ bilateral LE edema Skin: warm, no rash Psych: appropriate affect   Diagnostic Studies 07/2003 Cath RESULTS:  HEMODYNAMICS:  1. Left ventricular pressure 145/12.  2. Aortic pressure 150/82.  3. There was no aortic valve gradient.  LEFT VENTRICULOGRAM: Wall motion is normal. Ejection fraction estimated at  greater than or equal to 65%. There is no mitral regurgitation.  CORONARY ARTERIOGRAPHY (CO-DOMINANT):  1. Left main is normal.  2. Left anterior descending artery has a tubular 20% stenosis in the  proximal to mid vessel. The distal LAD has a tubular 80% stenosis. The  apical LAD has a tubular 80% stenosis. The LAD gives rise to a small  diagonal Jaylean Buenaventura.  3. The left circumflex was a large co-dominant vessel. It gives rise to a  large first obtuse marginal Davidmichael Zarazua. There is a 90% stenosis in the mid  portion of the first obtuse marginal Barney Russomanno. There is haziness  associated with this lesion, and it has the appearance of a positive  ruptured plaque. There is a normal sized second obtuse marginal Jazalynn Mireles,  which has a 50% stenosis proximally and a 60% stenosis distally. The  distal circumflex also gives rise to 2 small posterolateral branches.  4. The right coronary artery is a  relatively small co-dominant vessel.  There is diffuse 60% stenosis in the proximal vessel. In the mid vessel  at the acute margin, there is a 20% stenosis and the distal vessel has a  20% stenosis. There is a small to normal sized acute marginal Cincere Deprey,  which has a 70% at its origin. The distal right coronary artery also  gives rise to a small posterior descending coronary artery and a small  posterolateral Vendetta Pittinger.  IMPRESSION:  1. Normal left ventricular systolic function.  2. Three-vessel coronary artery disease, as described. The culprit lesion  appears to be the 90% stenosis in the first obtuse marginal Kera Deacon.  There is also significant disease in the distal LAD  and moderate, but  nonobstructive, disease in the small right coronary artery.  PLAN: Percutaneous intervention of the obtuse marginal and the LAD, see  below.  PTCA PROCEDURAL NOTE: Following completion of diagnostic catheterization,  we proceeded with percutaneous coronary intervention. We utilized the  preexisting #6 French sheath in the right femoral artery. Heparin and  Integrilin were administered per protocol. We used a #6 [place] LS 3.5  guiding catheter. A BMW wire was advanced under fluoroscopic guidance into  the distal portion of the first obtuse marginal Jermesha Sottile. We then performed  PTCA of the 90% stenosis in the obtuse marginal with a 2.5 x 12 mm Quantum  balloon inflated to 10 atmospheres. We then positioned a 2.5 x 13 mm CYPHER  drug-eluting stent across the area of diseased vessel and deployed the stent  at 15 atmospheres. We then went back with a 2.5 x 12 mm Quantum balloon  within the stent and inflated this balloon to 22 atmospheres. The final  angiographic images were obtained revealing patency of the obtuse marginal  Evanne Matsunaga with 0% residual stenosis and TIMI 3 flow.  We then turned our attention to the LAD. The BMW wire was advanced under  fluoroscopic guidance into the  apical portion of the LAD. We then performed  PTCA of the 80% stenosis in the distal LAD with a 2.0 x 15 mm Quantum  balloon inflated to 12 atmospheres. We then positioned a 2.0 x 15 mm Mini  Vision bare metal stent across the area of segment of disease and deployed  the stent at 12 atmospheres. We then went back with a 2.25 x 12 mm Quantum  balloon and positioning this within the stent, we inflated this balloon to  14 atmospheres in the proximal and distal edges of the stent. Final  angiographic images are obtained revealing patency of the LAD at the stent  site and with 0% residual stenosis and TIMI 3 flow.  COMPLICATIONS: None.  RESULTS:  1. Successful PTCA with placement of a drug-eluting stent in the first  obtuse marginal Renn Stille. A 90% stenosis with haziness was reduced to 0%  residual with TIMI 3 flow.  2. Successful PTCA with placement of a bare metal stent in the distal left  anterior descending artery. A tubular 80% stenosis was reduced to 0%  residual with TIMI flow.  PLAN: Integrilin will be continued for 18 hours. It is recommended that  the patient will be treated with Plavix for a minimum of 6-9 months. She  also needs aggressive risk factor modification. Regarding the residual  disease in the apical LAD, at this point, would recommend continue medical  therapy. If she has recurrent chest pain, which is refractor to medical  therapy, percutaneous coronary intervention of the apical LAD could be  considered.  08/2008 Echo Study Conclusions  1. Left ventricle: The cavity size was normal. Systolic function was normal. The estimated ejection fraction was in the range of 55% to 65%. Wall motion was normal; there were no regional wall motion abnormalities. 2. Aortic valve: Trivial regurgitation. 3. Mitral valve: No evidence of vegetation. 4. Left atrium: No evidence of thrombus in the atrial cavity or appendage. 5. Atrial septum: Echo contrast study  showed no right-to-left atrial level shunt, in the baseline state (Late bubbles felt to be nondiagnostic) There was an atrial septal aneurysm.  06/2015 echo Study Conclusions  - Left ventricle: The cavity size was normal. Wall thickness was   increased in a pattern of mild LVH. Systolic  function was normal.   The estimated ejection fraction was in the range of 60% to 65%.   Wall motion was normal; there were no regional wall motion   abnormalities. Doppler parameters are consistent with abnormal   left ventricular relaxation (grade 1 diastolic dysfunction). - Atrial septum: No defect or patent foramen ovale was identified.   Assessment and Plan  1. CAD - no current symptoms, continue current meds  2. Hx of CVA - continue ASA and statin for secondary prevention  3. Hyperlipidemia - continue statin  4. HTN -elevated n clinic, follow as she continues to diurese  5. LE edema -improving with lasix, we will continue      Arnoldo Lenis, M.D.

## 2015-12-23 NOTE — Patient Instructions (Signed)

## 2016-01-14 ENCOUNTER — Ambulatory Visit (HOSPITAL_COMMUNITY): Payer: Medicare Other | Admitting: Hematology

## 2016-02-05 ENCOUNTER — Other Ambulatory Visit: Payer: Self-pay | Admitting: Cardiology

## 2016-02-12 DIAGNOSIS — M0579 Rheumatoid arthritis with rheumatoid factor of multiple sites without organ or systems involvement: Secondary | ICD-10-CM | POA: Diagnosis not present

## 2016-02-12 DIAGNOSIS — Z79899 Other long term (current) drug therapy: Secondary | ICD-10-CM | POA: Diagnosis not present

## 2016-02-12 DIAGNOSIS — R609 Edema, unspecified: Secondary | ICD-10-CM | POA: Diagnosis not present

## 2016-02-12 DIAGNOSIS — Z5181 Encounter for therapeutic drug level monitoring: Secondary | ICD-10-CM | POA: Diagnosis not present

## 2016-02-12 DIAGNOSIS — Z79891 Long term (current) use of opiate analgesic: Secondary | ICD-10-CM | POA: Diagnosis not present

## 2016-02-22 ENCOUNTER — Other Ambulatory Visit: Payer: Self-pay | Admitting: Family Medicine

## 2016-03-06 ENCOUNTER — Other Ambulatory Visit: Payer: Self-pay | Admitting: Cardiology

## 2016-03-22 ENCOUNTER — Encounter: Payer: Medicare Other | Admitting: Family Medicine

## 2016-04-20 ENCOUNTER — Other Ambulatory Visit: Payer: Self-pay | Admitting: Adult Health

## 2016-04-26 ENCOUNTER — Other Ambulatory Visit: Payer: Self-pay | Admitting: Cardiology

## 2016-05-04 ENCOUNTER — Ambulatory Visit (INDEPENDENT_AMBULATORY_CARE_PROVIDER_SITE_OTHER): Payer: Medicare Other | Admitting: Family Medicine

## 2016-05-04 ENCOUNTER — Other Ambulatory Visit (HOSPITAL_COMMUNITY)
Admission: RE | Admit: 2016-05-04 | Discharge: 2016-05-04 | Disposition: A | Discharge status: Home / Self Care | Payer: Medicare Other | Source: Ambulatory Visit | Attending: Family Medicine | Admitting: Family Medicine

## 2016-05-04 ENCOUNTER — Encounter: Payer: Self-pay | Admitting: Family Medicine

## 2016-05-04 VITALS — BP 130/74 | HR 94 | Resp 16 | Ht 67.0 in | Wt 150.0 lb

## 2016-05-04 DIAGNOSIS — Z01419 Encounter for gynecological examination (general) (routine) without abnormal findings: Secondary | ICD-10-CM

## 2016-05-04 DIAGNOSIS — Z23 Encounter for immunization: Secondary | ICD-10-CM

## 2016-05-04 DIAGNOSIS — Z124 Encounter for screening for malignant neoplasm of cervix: Secondary | ICD-10-CM

## 2016-05-04 DIAGNOSIS — Z1211 Encounter for screening for malignant neoplasm of colon: Secondary | ICD-10-CM

## 2016-05-04 DIAGNOSIS — E785 Hyperlipidemia, unspecified: Secondary | ICD-10-CM | POA: Diagnosis not present

## 2016-05-04 DIAGNOSIS — I10 Essential (primary) hypertension: Secondary | ICD-10-CM | POA: Diagnosis not present

## 2016-05-04 DIAGNOSIS — Z720 Tobacco use: Secondary | ICD-10-CM | POA: Diagnosis not present

## 2016-05-04 DIAGNOSIS — Z114 Encounter for screening for human immunodeficiency virus [HIV]: Secondary | ICD-10-CM | POA: Diagnosis not present

## 2016-05-04 DIAGNOSIS — Z1151 Encounter for screening for human papillomavirus (HPV): Secondary | ICD-10-CM | POA: Diagnosis not present

## 2016-05-04 LAB — POC HEMOCCULT BLD/STL (OFFICE/1-CARD/DIAGNOSTIC): Fecal Occult Blood, POC: NEGATIVE

## 2016-05-04 NOTE — Patient Instructions (Signed)
F/u in 4 month, call if you need me sooner  Flu vaccine today  Labs needed, past due , lipid, cmp, Vit D  Please work on quitting smoking entirely, this will benefit your health greatly!  Thank you  for choosing [facility]. We consider it a privelige to serve you.  Delivering excellent health care in a caring and  compassionate way is our goal.  Partnering with you,  so that together we can achieve this goal is our strategy.

## 2016-05-05 LAB — COMPLETE METABOLIC PANEL WITH GFR
ALT: 39 U/L — ABNORMAL HIGH (ref 6–29)
AST: 39 U/L — ABNORMAL HIGH (ref 10–35)
Albumin: 3.5 g/dL — ABNORMAL LOW (ref 3.6–5.1)
Alkaline Phosphatase: 92 U/L (ref 33–130)
BUN: 11 mg/dL (ref 7–25)
CO2: 25 mmol/L (ref 20–31)
Calcium: 9.4 mg/dL (ref 8.6–10.4)
Chloride: 106 mmol/L (ref 98–110)
Creat: 0.79 mg/dL (ref 0.50–0.99)
GFR, Est African American: >89 mL/min (ref >=60)
GFR, Est Non African American: 82 mL/min (ref >=60)
Glucose, Bld: 93 mg/dL (ref 65–99)
Potassium: 3.9 mmol/L (ref 3.5–5.3)
Sodium: 140 mmol/L (ref 135–146)
Total Bilirubin: 0.3 mg/dL (ref 0.2–1.2)
Total Protein: 7 g/dL (ref 6.1–8.1)

## 2016-05-05 LAB — LIPID PANEL
Cholesterol: 144 mg/dL (ref <200)
HDL: 28 mg/dL — ABNORMAL LOW (ref >50)
LDL Cholesterol: 74 mg/dL (ref <100)
Total CHOL/HDL Ratio: 5.1 Ratio — ABNORMAL HIGH (ref <5.0)
Triglycerides: 210 mg/dL — ABNORMAL HIGH (ref <150)
VLDL: 42 mg/dL — ABNORMAL HIGH (ref <30)

## 2016-05-05 LAB — HIV ANTIBODY (ROUTINE TESTING W REFLEX): HIV 1&2 Ab, 4th Generation: NONREACTIVE

## 2016-05-06 DIAGNOSIS — Z1211 Encounter for screening for malignant neoplasm of colon: Secondary | ICD-10-CM | POA: Insufficient documentation

## 2016-05-06 DIAGNOSIS — Z01419 Encounter for gynecological examination (general) (routine) without abnormal findings: Secondary | ICD-10-CM | POA: Insufficient documentation

## 2016-05-06 NOTE — Progress Notes (Signed)
    Danielle Cruz     MRN: [id number]      DOB: February 15, 1956  HPI: Patient is in for annual physical exam. No other health concerns are expressed or addressed at the visit. Recent labs, if available are reviewed. Immunization is reviewed , and  updated if needed.   PE: BP 130/74   Pulse 94   Resp 16   Ht 5\' 7"  (1.702 m)   Wt 150 lb (68 kg)   SpO2 98%   BMI 23.49 kg/m   Pleasant  female, alert , in no cardio-pulmonary distress. Afebrile.  Breast: No asymetry,no masses or lumps. No tenderness. No nipple discharge or inversion. No axillary or supraclavicular adenopathy  Rectal:  Normal sphincter tone. No rectal mass. Guaiac negative stool.  GU: External genitalia normal female genitalia , normal female distribution of hair. No lesions. Urethral meatus normal in size, no  Prolapse, no lesions visibly  Present. Bladder non tender. Vagina pink and moist , with no visible lesions , discharge present . Adequate pelvic support no  cystocele or rectocele noted Cervix pink and appears healthy, no lesions or ulcerations noted, no discharge noted from os Uterus normal size, no adnexal masses, no cervical motion or adnexal tenderness.     Assessment & Plan:  Encounter for well woman exam with routine gynecological exam Exam as documented, pelvic and breast exam done as documented. Pap sent  Special screening for malignant neoplasms, colon Heme negative stool, no rectal mass  Tobacco abuse Patient counseled for approximately 5 minutes regarding the health risks of ongoing nicotine use, specifically all types of cancer, heart disease, stroke and respiratory failure. The options available for help with cessation ,the behavioral changes to assist the process, and the option to either gradully reduce usage  Or abruptly stop.is also discussed. Pt is also encouraged to set specific goals in number of cigarettes used daily, as well as to set a quit date.     Need for prophylactic  vaccination and inoculation against influenza After obtaining informed consent, the vaccine is  administered by LPN.

## 2016-05-06 NOTE — Assessment & Plan Note (Signed)

## 2016-05-06 NOTE — Assessment & Plan Note (Signed)
Heme negative stool , no rectal mass 

## 2016-05-06 NOTE — Assessment & Plan Note (Signed)
Exam as documented, pelvic and breast exam done as documented. Pap sent

## 2016-05-06 NOTE — Assessment & Plan Note (Signed)
After obtaining informed consent, the vaccine is  administered by LPN.  

## 2016-05-07 DIAGNOSIS — Z23 Encounter for immunization: Secondary | ICD-10-CM | POA: Diagnosis not present

## 2016-05-07 LAB — CYTOLOGY - PAP
Adequacy: ABSENT
Diagnosis: NEGATIVE
HPV: NOT DETECTED

## 2016-05-11 ENCOUNTER — Telehealth: Payer: Self-pay

## 2016-05-11 DIAGNOSIS — M899 Disorder of bone, unspecified: Secondary | ICD-10-CM

## 2016-05-11 DIAGNOSIS — M949 Disorder of cartilage, unspecified: Secondary | ICD-10-CM

## 2016-05-11 DIAGNOSIS — M0579 Rheumatoid arthritis with rheumatoid factor of multiple sites without organ or systems involvement: Secondary | ICD-10-CM | POA: Diagnosis not present

## 2016-05-11 DIAGNOSIS — E785 Hyperlipidemia, unspecified: Secondary | ICD-10-CM

## 2016-05-11 DIAGNOSIS — Z79899 Other long term (current) drug therapy: Secondary | ICD-10-CM | POA: Diagnosis not present

## 2016-05-11 DIAGNOSIS — M0589 Other rheumatoid arthritis with rheumatoid factor of multiple sites: Secondary | ICD-10-CM | POA: Diagnosis not present

## 2016-05-11 DIAGNOSIS — M25579 Pain in unspecified ankle and joints of unspecified foot: Secondary | ICD-10-CM | POA: Diagnosis not present

## 2016-05-11 DIAGNOSIS — Z79891 Long term (current) use of opiate analgesic: Secondary | ICD-10-CM | POA: Diagnosis not present

## 2016-05-11 DIAGNOSIS — M06872 Other specified rheumatoid arthritis, left ankle and foot: Secondary | ICD-10-CM | POA: Diagnosis not present

## 2016-05-11 DIAGNOSIS — R609 Edema, unspecified: Secondary | ICD-10-CM | POA: Diagnosis not present

## 2016-05-11 DIAGNOSIS — I10 Essential (primary) hypertension: Secondary | ICD-10-CM

## 2016-05-11 DIAGNOSIS — M7989 Other specified soft tissue disorders: Secondary | ICD-10-CM | POA: Diagnosis not present

## 2016-05-11 DIAGNOSIS — M25571 Pain in right ankle and joints of right foot: Secondary | ICD-10-CM | POA: Diagnosis not present

## 2016-05-11 NOTE — Telephone Encounter (Signed)
-----   Message from Fayrene Helper, MD sent at 05/08/2016  1:45 PM EST ----- pls advise eating too much fried and fatty food, TG still high tho better than last check, cut back on cheese, butter , egg yolk , red meat and oil Needs fasting lipid, cmp , and vit D for May visit , pls order

## 2016-05-17 ENCOUNTER — Other Ambulatory Visit: Payer: Self-pay | Admitting: Adult Health

## 2016-05-24 ENCOUNTER — Other Ambulatory Visit: Payer: Self-pay | Admitting: Cardiology

## 2016-06-18 ENCOUNTER — Other Ambulatory Visit: Payer: Self-pay | Admitting: Family Medicine

## 2016-07-13 DIAGNOSIS — Z5181 Encounter for therapeutic drug level monitoring: Secondary | ICD-10-CM | POA: Diagnosis not present

## 2016-07-23 ENCOUNTER — Other Ambulatory Visit: Payer: Self-pay | Admitting: Cardiology

## 2016-09-01 ENCOUNTER — Encounter: Payer: Self-pay | Admitting: Family Medicine

## 2016-09-01 ENCOUNTER — Ambulatory Visit: Payer: Medicare Other | Admitting: Family Medicine

## 2016-09-27 ENCOUNTER — Encounter: Payer: Self-pay | Admitting: Family Medicine

## 2016-09-27 ENCOUNTER — Ambulatory Visit (INDEPENDENT_AMBULATORY_CARE_PROVIDER_SITE_OTHER): Payer: Medicare Other | Admitting: Family Medicine

## 2016-09-27 VITALS — BP 138/70 | HR 80 | Temp 97.4°F | Resp 16 | Ht 67.0 in | Wt 148.1 lb

## 2016-09-27 DIAGNOSIS — I10 Essential (primary) hypertension: Secondary | ICD-10-CM | POA: Diagnosis not present

## 2016-09-27 DIAGNOSIS — I2510 Atherosclerotic heart disease of native coronary artery without angina pectoris: Secondary | ICD-10-CM

## 2016-09-27 DIAGNOSIS — Z720 Tobacco use: Secondary | ICD-10-CM

## 2016-09-27 DIAGNOSIS — E785 Hyperlipidemia, unspecified: Secondary | ICD-10-CM | POA: Diagnosis not present

## 2016-09-27 DIAGNOSIS — F17210 Nicotine dependence, cigarettes, uncomplicated: Secondary | ICD-10-CM

## 2016-09-27 NOTE — Assessment & Plan Note (Signed)
Controlled, no change in medication DASH diet and commitment to daily physical activity for a minimum of 30 minutes discussed and encouraged, as a part of hypertension management. The importance of attaining a healthy weight is also discussed.  BP/Weight 09/27/2016 05/04/2016 12/23/2015 12/02/2015 11/10/2015 03/18/2015 05/07/1476  Systolic BP 295 621 308 657 [phone]  Diastolic BP 70 74 70 78 68 76 78  Wt. (Lbs) 148.12 150 152 152 153 149.4 143  BMI 23.2 23.49 23.81 23.81 23.96 24.86 23.09

## 2016-09-27 NOTE — Assessment & Plan Note (Signed)
Denies new chest pain, PND, orthopnea or leg swelling Limited activity

## 2016-09-27 NOTE — Assessment & Plan Note (Signed)
Patient is asked and  confirms current  Nicotine use.  Five to seven minutes of time is spent in counseling the patient of the need to quit smoking  Advice to quit is delivered clearly specifically in reducing the risk of developing heart disease, having a stroke, or of developing all types of cancer, especially lung and oral cancer. Improvement in breathing and exercise tolerance and quality of life is also discussed, as is the economic benefit.  Assessment of willingness to quit or to make an attempt to quit is made and documented  Assistance in quit attempt is made with several and varied options presented, based on patient's desire and need. These include  literature, local classes available, 1800 QUIT NOW number, OTC and prescription medication.  The GOAL to be NICOTINE FREE is re emphasized.  The patient has set a personal goal of either reduction to 10 cigarettes daily in next 4 months, current use is 15 /day f/u is in 16 weeks.

## 2016-09-27 NOTE — Patient Instructions (Addendum)
Wellness visit in 4 months with nurse and MD visit the same day, call if you need me sooner  :Lipid, cmp today   Need to stop smoking   Steps to Quit Smoking Smoking tobacco can be bad for your health. It can also affect almost every organ in your body. Smoking puts you and people around you at risk for many serious long-lasting (chronic) diseases. Quitting smoking is hard, but it is one of the best things that you can do for your health. It is never too late to quit. What are the benefits of quitting smoking? When you quit smoking, you lower your risk for getting serious diseases and conditions. They can include:  Lung cancer or lung disease.  Heart disease.  Stroke.  Heart attack.  Not being able to have children (infertility).  Weak bones (osteoporosis) and broken bones (fractures).  If you have coughing, wheezing, and shortness of breath, those symptoms may get better when you quit. You may also get sick less often. If you are pregnant, quitting smoking can help to lower your chances of having a baby of low birth weight. What can I do to help me quit smoking? Talk with your doctor about what can help you quit smoking. Some things you can do (strategies) include:  Quitting smoking totally, instead of slowly cutting back how much you smoke over a period of time.  Going to in-person counseling. You are more likely to quit if you go to many counseling sessions.  Using resources and support systems, such as: ? Database administrator with a Social worker. ? Phone quitlines. ? Careers information officer. ? Support groups or group counseling. ? Text messaging programs. ? Mobile phone apps or applications.  Taking medicines. Some of these medicines may have nicotine in them. If you are pregnant or breastfeeding, do not take any medicines to quit smoking unless your doctor says it is okay. Talk with your doctor about counseling or other things that can help you.  Talk with your doctor about  using more than one strategy at the same time, such as taking medicines while you are also going to in-person counseling. This can help make quitting easier. What things can I do to make it easier to quit? Quitting smoking might feel very hard at first, but there is a lot that you can do to make it easier. Take these steps:  Talk to your family and friends. Ask them to support and encourage you.  Call phone quitlines, reach out to support groups, or work with a Social worker.  Ask people who smoke to not smoke around you.  Avoid places that make you want (trigger) to smoke, such as: ? Bars. ? Parties. ? Smoke-break areas at work.  Spend time with people who do not smoke.  Lower the stress in your life. Stress can make you want to smoke. Try these things to help your stress: ? Getting regular exercise. ? Deep-breathing exercises. ? Yoga. ? Meditating. ? Doing a body scan. To do this, close your eyes, focus on one area of your body at a time from head to toe, and notice which parts of your body are tense. Try to relax the muscles in those areas.  Download or buy apps on your mobile phone or tablet that can help you stick to your quit plan. There are many free apps, such as QuitGuide from the [employer] Office manager for Disease Control and Prevention). You can find more support from smokefree.gov and other websites.  This information  is not intended to replace advice given to you by your health care provider. Make sure you discuss any questions you have with your health care provider. Document Released: 01/30/2009 Document Revised: 12/02/2015 Document Reviewed: 08/20/2014 Elsevier Interactive Patient Education  2018 [employer].

## 2016-09-27 NOTE — Progress Notes (Signed)
Danielle Cruz     MRN: [id number]      DOB: 12/14/1955   HPI Danielle Cruz is here for follow up and re-evaluation of chronic medical conditions, medication management and review of any available recent lab and radiology data.  Preventive health is updated, specifically  Cancer screening and Immunization.   Questions or concerns regarding consultations or procedures which the PT has had in the interim are  addressed. The PT denies any adverse reactions to current medications since the last visit.  There are no new concerns.  There are no specific complaints   ROS Denies recent fever or chills. Denies sinus pressure, nasal congestion, ear pain or sore throat. Denies chest congestion, productive cough or wheezing. Denies chest pains, palpitations and leg swelling Denies abdominal pain, nausea, vomiting,diarrhea or constipation.   Denies dysuria, frequency, hesitancy or incontinence. Denies joint pain, swelling and limitation in mobility. Denies headaches, seizures, numbness, or tingling. Denies depression, anxiety or insomnia. Denies skin break down or rash.   PE  BP 138/70 (BP Location: Left Arm, Patient Position: Sitting, Cuff Size: Normal)   Pulse 80   Temp 97.4 F (36.3 C) (Temporal)   Resp 16   Ht 5\' 7"  (1.702 m)   Wt 148 lb 1.9 oz (67.2 kg)   SpO2 97%   BMI 23.20 kg/m   Patient alert and oriented and in no cardiopulmonary distress.  HEENT: No facial asymmetry, EOMI,   oropharynx pink and moist.  Neck supple no JVD, no mass.  Chest: Clear to auscultation bilaterally.decreased though adequate air entry  CVS: S1, S2 no murmurs, no S3.Regular rate.  ABD: Soft non tender.   Ext: No edema  MS: decreased  ROM spine, shoulders, hips and knees.  Skin: Intact, no ulcerations or rash noted.  Psych: Good eye contact, normal affect. Memory intact not anxious or depressed appearing.  CNS: CN 2-12 intact, grade 2 power in RUE   Assessment & Plan  Essential  hypertension Controlled, no change in medication DASH diet and commitment to daily physical activity for a minimum of 30 minutes discussed and encouraged, as a part of hypertension management. The importance of attaining a healthy weight is also discussed.  BP/Weight 09/27/2016 05/04/2016 12/23/2015 12/02/2015 11/10/2015 03/18/2015 9/62/9528  Systolic BP 413 244 010 272 536 644 034  Diastolic BP 70 74 70 78 68 76 78  Wt. (Lbs) 148.12 150 152 152 153 149.4 143  BMI 23.2 23.49 23.81 23.81 23.96 24.86 23.09       Hyperlipemia Uncontrolled Hyperlipidemia:Low fat diet discussed and encouraged.   Lipid Panel  Lab Results  Component Value Date   CHOL 144 05/04/2016   HDL 28 (L) 05/04/2016   LDLCALC 74 05/04/2016   TRIG 210 (H) 05/04/2016   CHOLHDL 5.1 (H) 05/04/2016     Updated lab needed at/ before next visit.   Tobacco abuse Patient is asked and  confirms current  Nicotine use.  Five to seven minutes of time is spent in counseling the patient of the need to quit smoking  Advice to quit is delivered clearly specifically in reducing the risk of developing heart disease, having a stroke, or of developing all types of cancer, especially lung and oral cancer. Improvement in breathing and exercise tolerance and quality of life is also discussed, as is the economic benefit.  Assessment of willingness to quit or to make an attempt to quit is made and documented  Assistance in quit attempt is made with several and varied  options presented, based on patient's desire and need. These include  literature, local classes available, 1800 QUIT NOW number, OTC and prescription medication.  The GOAL to be NICOTINE FREE is re emphasized.  The patient has set a personal goal of either reduction to 10 cigarettes daily in next 4 months, current use is 15 /day f/u is in 16 weeks.    CAD in native artery Denies new chest pain, PND, orthopnea or leg swelling Limited activity

## 2016-09-27 NOTE — Assessment & Plan Note (Signed)
Uncontrolled Hyperlipidemia:Low fat diet discussed and encouraged.   Lipid Panel  Lab Results  Component Value Date   CHOL 144 05/04/2016   HDL 28 (L) 05/04/2016   LDLCALC 74 05/04/2016   TRIG 210 (H) 05/04/2016   CHOLHDL 5.1 (H) 05/04/2016     Updated lab needed at/ before next visit.

## 2016-10-29 ENCOUNTER — Other Ambulatory Visit: Payer: Self-pay | Admitting: Family Medicine

## 2016-11-10 DIAGNOSIS — Z79891 Long term (current) use of opiate analgesic: Secondary | ICD-10-CM | POA: Diagnosis not present

## 2016-11-10 DIAGNOSIS — Z79899 Other long term (current) drug therapy: Secondary | ICD-10-CM | POA: Diagnosis not present

## 2016-11-10 DIAGNOSIS — M0579 Rheumatoid arthritis with rheumatoid factor of multiple sites without organ or systems involvement: Secondary | ICD-10-CM | POA: Diagnosis not present

## 2016-11-10 DIAGNOSIS — Z5181 Encounter for therapeutic drug level monitoring: Secondary | ICD-10-CM | POA: Diagnosis not present

## 2016-11-10 DIAGNOSIS — R609 Edema, unspecified: Secondary | ICD-10-CM | POA: Diagnosis not present

## 2016-12-10 DIAGNOSIS — R609 Edema, unspecified: Secondary | ICD-10-CM | POA: Diagnosis not present

## 2016-12-10 DIAGNOSIS — Z79899 Other long term (current) drug therapy: Secondary | ICD-10-CM | POA: Diagnosis not present

## 2016-12-10 DIAGNOSIS — Z79891 Long term (current) use of opiate analgesic: Secondary | ICD-10-CM | POA: Diagnosis not present

## 2016-12-10 DIAGNOSIS — M0579 Rheumatoid arthritis with rheumatoid factor of multiple sites without organ or systems involvement: Secondary | ICD-10-CM | POA: Diagnosis not present

## 2016-12-13 ENCOUNTER — Other Ambulatory Visit: Payer: Self-pay | Admitting: Cardiology

## 2017-01-10 ENCOUNTER — Other Ambulatory Visit: Payer: Self-pay | Admitting: Cardiology

## 2017-01-14 ENCOUNTER — Telehealth: Payer: Self-pay | Admitting: Family Medicine

## 2017-01-14 NOTE — Telephone Encounter (Signed)
Danielle Cruz (pt's husband) came by requesting that we send script to [place] for a new wheelchair.  She is eligible for an upgrade.  The current wheelchair does not stay charged and is in need of costly repair.    cb  [phone]

## 2017-01-17 NOTE — Telephone Encounter (Signed)
Will address at 10/15 appt let him know will document need etc

## 2017-01-17 NOTE — Telephone Encounter (Signed)
Called patient. Sounded like someone picked up, but no response.

## 2017-01-18 DIAGNOSIS — Z79899 Other long term (current) drug therapy: Secondary | ICD-10-CM | POA: Diagnosis not present

## 2017-01-18 DIAGNOSIS — Z5181 Encounter for therapeutic drug level monitoring: Secondary | ICD-10-CM | POA: Diagnosis not present

## 2017-01-28 ENCOUNTER — Other Ambulatory Visit: Payer: Self-pay | Admitting: Family Medicine

## 2017-01-31 ENCOUNTER — Ambulatory Visit: Payer: Medicare Other

## 2017-01-31 ENCOUNTER — Encounter: Payer: Self-pay | Admitting: Family Medicine

## 2017-01-31 ENCOUNTER — Ambulatory Visit (INDEPENDENT_AMBULATORY_CARE_PROVIDER_SITE_OTHER): Payer: Medicare Other | Admitting: Family Medicine

## 2017-01-31 VITALS — HR 74 | Temp 98.1°F | Resp 16 | Ht 67.0 in | Wt 139.0 lb

## 2017-01-31 DIAGNOSIS — E785 Hyperlipidemia, unspecified: Secondary | ICD-10-CM | POA: Diagnosis not present

## 2017-01-31 DIAGNOSIS — Z23 Encounter for immunization: Secondary | ICD-10-CM

## 2017-01-31 DIAGNOSIS — Z9181 History of falling: Secondary | ICD-10-CM | POA: Diagnosis not present

## 2017-01-31 DIAGNOSIS — I69051 Hemiplegia and hemiparesis following nontraumatic subarachnoid hemorrhage affecting right dominant side: Secondary | ICD-10-CM | POA: Diagnosis not present

## 2017-01-31 DIAGNOSIS — Z1231 Encounter for screening mammogram for malignant neoplasm of breast: Secondary | ICD-10-CM

## 2017-01-31 DIAGNOSIS — Z720 Tobacco use: Secondary | ICD-10-CM | POA: Diagnosis not present

## 2017-01-31 DIAGNOSIS — I10 Essential (primary) hypertension: Secondary | ICD-10-CM

## 2017-01-31 DIAGNOSIS — I2510 Atherosclerotic heart disease of native coronary artery without angina pectoris: Secondary | ICD-10-CM | POA: Diagnosis not present

## 2017-01-31 LAB — CBC
HCT: 32.3 % — ABNORMAL LOW (ref 35.0–45.0)
Hemoglobin: 10.9 g/dL — ABNORMAL LOW (ref 11.7–15.5)
MCH: 33 pg (ref 27.0–33.0)
MCHC: 33.7 g/dL (ref 32.0–36.0)
MCV: 97.9 fL (ref 80.0–100.0)
MPV: 10.9 fL (ref 7.5–12.5)
Platelets: 214 10*3/uL (ref 140–400)
RBC: 3.3 10*6/uL — ABNORMAL LOW (ref 3.80–5.10)
RDW: 15.5 % — ABNORMAL HIGH (ref 11.0–15.0)
WBC: 6.6 10*3/uL (ref 3.8–10.8)

## 2017-01-31 LAB — LIPID PANEL
Cholesterol: 142 mg/dL (ref <200)
HDL: 30 mg/dL — ABNORMAL LOW (ref >50)
LDL Cholesterol (Calc): 84 mg/dL (calc)
Non-HDL Cholesterol (Calc): 112 mg/dL (calc) (ref <130)
Total CHOL/HDL Ratio: 4.7 (calc) (ref <5.0)
Triglycerides: 187 mg/dL — ABNORMAL HIGH (ref <150)

## 2017-01-31 LAB — HEPATIC FUNCTION PANEL
AG Ratio: 1.3 (calc) (ref 1.0–2.5)
ALT: 33 U/L — ABNORMAL HIGH (ref 6–29)
AST: 36 U/L — ABNORMAL HIGH (ref 10–35)
Albumin: 3.9 g/dL (ref 3.6–5.1)
Alkaline phosphatase (APISO): 80 U/L (ref 33–130)
Bilirubin, Direct: 0.1 mg/dL (ref 0.0–0.2)
Globulin: 3.1 g/dL (calc) (ref 1.9–3.7)
Indirect Bilirubin: 0.3 mg/dL (calc) (ref 0.2–1.2)
Total Bilirubin: 0.4 mg/dL (ref 0.2–1.2)
Total Protein: 7 g/dL (ref 6.1–8.1)

## 2017-01-31 LAB — TSH: TSH: 2.08 mIU/L (ref 0.40–4.50)

## 2017-01-31 MED ORDER — METOPROLOL SUCCINATE ER 25 MG PO TB24: 25.0000 mg | ORAL_TABLET | Freq: Every day | ORAL | 1 refills | Status: DC

## 2017-01-31 MED ORDER — MONTELUKAST SODIUM 10 MG PO TABS: 10.0000 mg | ORAL_TABLET | Freq: Every day | ORAL | 3 refills | Status: DC

## 2017-01-31 NOTE — Assessment & Plan Note (Signed)
Right hemiparesis with expressive ahasia, limited mobility lifelong need for power wheelchair due to both iupper and lower extremity weakness. Current chair is 61 years old and cannot be repaired , a new power wheelchair is required for safe , independent function in her home enviromnment

## 2017-01-31 NOTE — Assessment & Plan Note (Signed)
Patient is asked and  confirms current  Nicotine use.  Five to seven minutes of time is spent in counseling the patient of the need to quit smoking  Advice to quit is delivered clearly specifically in reducing the risk of developing heart disease, having a stroke, or of developing all types of cancer, especially lung and oral cancer. Improvement in breathing and exercise tolerance and quality of life is also discussed, as is the economic benefit.  Assessment of willingness to quit or to make an attempt to quit is made and documented  Assistance in quit attempt is made with several and varied options presented, based on patient's desire and need. These include  literature, local classes available, 1800 QUIT NOW number, OTC and prescription medication.  The GOAL to be NICOTINE FREE is re emphasized.  The patient has set a personal goal of either reduction or discontinuation and follow up is arranged between 6 an 16 weeks. Quiot date is April 19, 2017

## 2017-01-31 NOTE — Progress Notes (Signed)
Danielle Cruz     MRN: [id number]      DOB: [birth year]   HPI Danielle Cruz is here because of a need for a new power wheelchair. The one she has no longer works , as the on she currently has even with new batteries in it will not stay charged and work. She has had it since 2009. Patient had a stroke in 2009 and remains hemiparetic on the right with expressive aphasia. 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 Preventive health is updated, specifically  Cancer screening and Immunization.   C/o excess clear nasal drainage, no fever or chills  ROS Denies recent fever or chills. Denies sinus pressure, ear pain or sore throat. Denies chest congestion, productive cough or wheezing. Denies chest pains, palpitations and leg swelling Denies abdominal pain, nausea, vomiting,diarrhea or constipation.   Denies dysuria, frequency, hesitancy or incontinence. Denies joint pain, swelling Denies headaches, seizures, numbness, or tingling. Denies depression, anxiety or insomnia. Denies skin break down or rash.   PE  Pulse 74   Temp 98.1 F (36.7 C) (Other (Comment))   Resp 16   Ht 5\' 7"  (1.702 m)   Wt 139 lb (63 kg)   SpO2 98%   BMI 21.77 kg/m   Patient alert , she has expressive aphasia and is  in no cardiopulmonary distress.  HEENT: No facial asymmetry, EOMI,   oropharynx pink and moist.  Neck supple no JVD, no mass.  Chest: Clear to auscultation bilaterally.  CVS: S1, S2 no murmurs, no S3.Regular rate.  ABD: Soft non tender.   Ext: No edema  MS: Adequate ROM spine, shoulders, hips and knees.  Skin: Intact, no ulcerations or rash noted.  Psych: Good eye contact, normal affect. Memory intact not anxious or depressed appearing.  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 Current chair is 61 years old and cannot be repaired , a new power wheelchair is required for safe , independent function in her home enviromnment  Tobacco abuse Patient is asked and  confirms current  Nicotine use.  Five to seven minutes of time is spent in counseling the patient of the need to quit smoking  Advice to quit is delivered clearly specifically in reducing the risk of developing heart disease, having a stroke, or of developing all types of cancer, especially lung and oral cancer. Improvement in breathing and exercise tolerance and quality of life is also discussed, as is the economic benefit.  Assessment of willingness to quit or to make an attempt to quit is made and documented  Assistance in quit attempt is made with several and varied options presented, based on patient's desire and need. These include  literature, local classes available, 1800 QUIT NOW number, OTC and prescription medication.  The GOAL to be NICOTINE FREE is re emphasized.  The patient has set a personal goal of either reduction or discontinuation and follow up is arranged between 6 an 16  weeks. Quiot date is April 19, 2017    Essential hypertension Controlled, no change in medication   Cardiovascular disease Asymptomatic, howvere, needs to schedule and keep appt for cardiology folow up, spouse is aware  At high risk for falls Pt at high fall risk due to right hemiplegia from cVA, she requires mobility device at home for safety and independence, mpotorized as she has established weakness in both upper and lower extremites. Current pWC is 61 years old and cannot  be repaired, a new PWC is needed General home safety is also dscussed

## 2017-01-31 NOTE — Assessment & Plan Note (Signed)
Asymptomatic, howvere, needs to schedule and keep appt for cardiology folow up, spouse is aware

## 2017-01-31 NOTE — Telephone Encounter (Signed)
Seen 10 15 18

## 2017-01-31 NOTE — Assessment & Plan Note (Signed)
Controlled, no change in medication  

## 2017-01-31 NOTE — Patient Instructions (Signed)
Wellness with nurse is past due, needs this before e end of the year  Please schedule mammogram at checkout  F/U with MD  with rectal in 4.5 months  New medication for allergies singulair sent to your pharmacy today  Metoprolol is refilled today by MD  Flu Vaccine  today  Need to quit smoking now at 12 per day, goal is QUIT by Jan 1 , 2019, Rhianna is a part of this, high five done!   Nivea lotion for hands  CBC, lipid, hepatic, tsh and vit D today

## 2017-01-31 NOTE — Assessment & Plan Note (Addendum)
Pt at high fall risk due to right hemiplegia from cVA, she requires mobility device at home for safety and independence, mpotorized as she has established weakness in both upper and lower extremites. Current pWC is 61 years old and cannot be repaired, a new PWC is needed General home safety is also dscussed

## 2017-02-04 ENCOUNTER — Other Ambulatory Visit: Payer: Self-pay | Admitting: Family Medicine

## 2017-02-04 NOTE — Telephone Encounter (Signed)
Seen 10 15 18

## 2017-02-07 ENCOUNTER — Encounter: Payer: Self-pay | Admitting: Family Medicine

## 2017-02-10 DIAGNOSIS — R609 Edema, unspecified: Secondary | ICD-10-CM | POA: Diagnosis not present

## 2017-02-10 DIAGNOSIS — Z79899 Other long term (current) drug therapy: Secondary | ICD-10-CM | POA: Diagnosis not present

## 2017-02-10 DIAGNOSIS — M25579 Pain in unspecified ankle and joints of unspecified foot: Secondary | ICD-10-CM | POA: Diagnosis not present

## 2017-02-10 DIAGNOSIS — M0579 Rheumatoid arthritis with rheumatoid factor of multiple sites without organ or systems involvement: Secondary | ICD-10-CM | POA: Diagnosis not present

## 2017-02-10 DIAGNOSIS — Z5181 Encounter for therapeutic drug level monitoring: Secondary | ICD-10-CM | POA: Diagnosis not present

## 2017-02-10 DIAGNOSIS — Z79891 Long term (current) use of opiate analgesic: Secondary | ICD-10-CM | POA: Diagnosis not present

## 2017-02-11 ENCOUNTER — Telehealth: Payer: Self-pay | Admitting: Family Medicine

## 2017-02-11 NOTE — Telephone Encounter (Signed)
Danielle Cruz (husband) calling about power wheelchair.  Will you send paper work to [address]?  Algis Greenhouse Is the contact person  [phone]

## 2017-02-14 NOTE — Telephone Encounter (Signed)
Message left for [facility] to be faxed over, pls fill in as able , I will complete e the rest, the paper work has been completed

## 2017-02-18 ENCOUNTER — Telehealth: Payer: Self-pay | Admitting: Family Medicine

## 2017-02-18 ENCOUNTER — Ambulatory Visit (HOSPITAL_COMMUNITY): Payer: Medicare Other

## 2017-02-18 DIAGNOSIS — M0579 Rheumatoid arthritis with rheumatoid factor of multiple sites without organ or systems involvement: Secondary | ICD-10-CM | POA: Diagnosis not present

## 2017-02-18 NOTE — Telephone Encounter (Signed)
Left vm to r/s Monday awv to later that day or wed am

## 2017-02-21 ENCOUNTER — Ambulatory Visit: Payer: Medicare Other

## 2017-03-03 ENCOUNTER — Telehealth: Payer: Self-pay | Admitting: Cardiology

## 2017-03-03 DIAGNOSIS — E785 Hyperlipidemia, unspecified: Secondary | ICD-10-CM

## 2017-03-03 DIAGNOSIS — Z1231 Encounter for screening mammogram for malignant neoplasm of breast: Secondary | ICD-10-CM

## 2017-03-03 DIAGNOSIS — I10 Essential (primary) hypertension: Secondary | ICD-10-CM

## 2017-03-03 MED ORDER — METOPROLOL SUCCINATE ER 25 MG PO TB24: 25.0000 mg | ORAL_TABLET | Freq: Every day | ORAL | 0 refills | Status: DC

## 2017-03-03 NOTE — Telephone Encounter (Signed)
°*  STAT* If patient is at the pharmacy, call can be transferred to refill team.   1. Which medications need to be refilled? (please list name of each medication and dose if known) metoprolol succinate (TOPROL-XL) 25 MG 24 hr tablet [[id number]]   2. Which pharmacy/location (including street and city if local pharmacy) is medication to be sent to? [facility] [place]   3. Do they need a 30 day or 90 day supply? 90 day  Pt has apt w/ JB on 05/19/17

## 2017-03-03 NOTE — Addendum Note (Signed)
Addended by: Levonne Hubert on: 03/03/2017 10:12 AM   Modules accepted: Orders

## 2017-03-21 ENCOUNTER — Ambulatory Visit: Payer: Medicare Other

## 2017-03-21 VITALS — BP 128/80 | HR 80 | Temp 97.5°F | Resp 16 | Ht 67.0 in | Wt 152.1 lb

## 2017-03-21 DIAGNOSIS — Z0000 Encounter for general adult medical examination without abnormal findings: Secondary | ICD-10-CM

## 2017-03-21 NOTE — Progress Notes (Addendum)
Patient ID: Danielle Cruz, female    DOB: 12/09/1955, 61 y.o.   MRN: [id number]   Danielle Cruz , Thank you for taking time to come for your Medicare Wellness Visit. I appreciate your ongoing commitment to your health goals. Please review the following plan we discussed and let me know if I can assist you in the future.   Screening recommendations/referrals: Colonoscopy: done Mammogram: ordered Bone Density: discuss with pcp Recommended yearly ophthalmology/optometry visit for glaucoma screening and checkup Recommended yearly dental visit for hygiene and checkup  Vaccinations: Influenza vaccine: done  Pneumococcal vaccine: done Tdap vaccine: done Shingles vaccine: discuss with pcp        Next appointment: yearly  Preventive Care 40-64 Years, Female Preventive care refers to lifestyle choices and visits with your health care provider that can promote health and wellness. What does preventive care include?  A yearly physical exam. This is also called an annual well check.  Dental exams once or twice a year.  Routine eye exams. Ask your health care provider how often you should have your eyes checked.  Personal lifestyle choices, including:  Daily care of your teeth and gums.  Regular physical activity.  Eating a healthy diet.  Avoiding tobacco and drug use.  Limiting alcohol use.  Practicing safe sex.  Taking low-dose aspirin daily starting at age 21.  Taking vitamin and mineral supplements as recommended by your health care provider. What happens during an annual well check? The services and screenings done by your health care provider during your annual well check will depend on your age, overall health, lifestyle risk factors, and family history of disease. Counseling  Your health care provider may ask you questions about your:  Alcohol use.  Tobacco use.  Drug use.  Emotional well-being.  Home and relationship well-being.  Sexual activity.  Eating  habits.  Work and work Statistician.  Method of birth control.  Menstrual cycle.  Pregnancy history. Screening  You may have the following tests or measurements:  Height, weight, and BMI.  Blood pressure.  Lipid and cholesterol levels. These may be checked every 5 years, or more frequently if you are over 53 years old.  Skin check.  Lung cancer screening. You may have this screening every year starting at age 72 if you have a 30-pack-year history of smoking and currently smoke or have quit within the past 15 years.  Fecal occult blood test (FOBT) of the stool. You may have this test every year starting at age 66.  Flexible sigmoidoscopy or colonoscopy. You may have a sigmoidoscopy every 5 years or a colonoscopy every 10 years starting at age 74.  Hepatitis C blood test.  Hepatitis B blood test.  Sexually transmitted disease (STD) testing.  Diabetes screening. This is done by checking your blood sugar (glucose) after you have not eaten for a while (fasting). You may have this done every 1-3 years.  Mammogram. This may be done every 1-2 years. Talk to your health care provider about when you should start having regular mammograms. This may depend on whether you have a family history of breast cancer.  BRCA-related cancer screening. This may be done if you have a family history of breast, ovarian, tubal, or peritoneal cancers.  Pelvic exam and Pap test. This may be done every 3 years starting at age 23. Starting at age 83, this may be done every 5 years if you have a Pap test in combination with an HPV test.  Bone  density scan. This is done to screen for osteoporosis. You may have this scan if you are at high risk for osteoporosis. Discuss your test results, treatment options, and if necessary, the need for more tests with your health care provider. Vaccines  Your health care provider may recommend certain vaccines, such as:  Influenza vaccine. This is recommended every  year.  Tetanus, diphtheria, and acellular pertussis (Tdap, Td) vaccine. You may need a Td booster every 10 years.  Zoster vaccine. You may need this after age 78.  Pneumococcal 13-valent conjugate (PCV13) vaccine. You may need this if you have certain conditions and were not previously vaccinated.  Pneumococcal polysaccharide (PPSV23) vaccine. You may need one or two doses if you smoke cigarettes or if you have certain conditions. Talk to your health care provider about which screenings and vaccines you need and how often you need them. This information is not intended to replace advice given to you by your health care provider. Make sure you discuss any questions you have with your health care provider. Document Released: 05/02/2015 Document Revised: 12/24/2015 Document Reviewed: 02/04/2015 Elsevier Interactive Patient Education  2017 [place] Prevention in the Home Falls can cause injuries. They can happen to people of all ages. There are many things you can do to make your home safe and to help prevent falls. What can I do on the outside of my home?  Regularly fix the edges of walkways and driveways and fix any cracks.  Remove anything that might make you trip as you walk through a door, such as a raised step or threshold.  Trim any bushes or trees on the path to your home.  Use bright outdoor lighting.  Clear any walking paths of anything that might make someone trip, such as rocks or tools.  Regularly check to see if handrails are loose or broken. Make sure that both sides of any steps have handrails.  Any raised decks and porches should have guardrails on the edges.  Have any leaves, snow, or ice cleared regularly.  Use sand or salt on walking paths during winter.  Clean up any spills in your garage right away. This includes oil or grease spills. What can I do in the bathroom?  Use night lights.  Install grab bars by the toilet and in the tub and shower.  Do not use towel bars as grab bars.  Use non-skid mats or decals in the tub or shower.  If you need to sit down in the shower, use a plastic, non-slip stool.  Keep the floor dry. Clean up any water that spills on the floor as soon as it happens.  Remove soap buildup in the tub or shower regularly.  Attach bath mats securely with double-sided non-slip rug tape.  Do not have throw rugs and other things on the floor that can make you trip. What can I do in the bedroom?  Use night lights.  Make sure that you have a light by your bed that is easy to reach.  Do not use any sheets or blankets that are too big for your bed. They should not hang down onto the floor.  Have a firm chair that has side arms. You can use this for support while you get dressed.  Do not have throw rugs and other things on the floor that can make you trip. What can I do in the kitchen?  Clean up any spills right away.  Avoid walking on wet  floors.  Keep items that you use a lot in easy-to-reach places.  If you need to reach something above you, use a strong step stool that has a grab bar.  Keep electrical cords out of the way.  Do not use floor polish or wax that makes floors slippery. If you must use wax, use non-skid floor wax.  Do not have throw rugs and other things on the floor that can make you trip. What can I do with my stairs?  Do not leave any items on the stairs.  Make sure that there are handrails on both sides of the stairs and use them. Fix handrails that are broken or loose. Make sure that handrails are as long as the stairways.  Check any carpeting to make sure that it is firmly attached to the stairs. Fix any carpet that is loose or worn.  Avoid having throw rugs at the top or bottom of the stairs. If you do have throw rugs, attach them to the floor with carpet tape.  Make sure that you have a light switch at the top of the stairs and the bottom of the stairs. If you do not have them,  ask someone to add them for you. What else can I do to help prevent falls?  Wear shoes that:  Do not have high heels.  Have rubber bottoms.  Are comfortable and fit you well.  Are closed at the toe. Do not wear sandals.  If you use a stepladder:  Make sure that it is fully opened. Do not climb a closed stepladder.  Make sure that both sides of the stepladder are locked into place.  Ask someone to hold it for you, if possible.  Clearly mark and make sure that you can see:  Any grab bars or handrails.  First and last steps.  Where the edge of each step is.  Use tools that help you move around (mobility aids) if they are needed. These include:  Canes.  Walkers.  Scooters.  Crutches.  Turn on the lights when you go into a dark area. Replace any light bulbs as soon as they burn out.  Set up your furniture so you have a clear path. Avoid moving your furniture around.  If any of your floors are uneven, fix them.  If there are any pets around you, be aware of where they are.  Review your medicines with your doctor. Some medicines can make you feel dizzy. This can increase your chance of falling. Ask your doctor what other things that you can do to help prevent falls. This information is not intended to replace advice given to you by your health care provider. Make sure you discuss any questions you have with your health care provider. Document Released: 01/30/2009 Document Revised: 09/11/2015 Document Reviewed: 05/10/2014 Elsevier Interactive Patient Education  2017 [place] for Adults  A healthy lifestyle and preventive care can promote health and wellness. Preventive health guidelines for adults include the following key practices.  . A routine yearly physical is a good way to check with your health care provider about your health and preventive screening. It is a chance to share any concerns and updates on your health and to receive a  thorough exam.  . Visit your dentist for a routine exam and preventive care every 6 months. Brush your teeth twice a day and floss once a day. Good oral hygiene prevents tooth decay and gum disease.  . The frequency of  eye exams is based on your age, health, family medical history, use  of contact lenses, and other factors. Follow your health care provider's ecommendations for frequency of eye exams.  . Eat a healthy diet. Foods like vegetables, fruits, whole grains, low-fat dairy products, and lean protein foods contain the nutrients you need without too many calories. Decrease your intake of foods high in solid fats, added sugars, and salt. Eat the right amount of calories for you. Get information about a proper diet from your health care provider, if necessary.  . Regular physical exercise is one of the most important things you can do for your health. Most adults should get at least 150 minutes of moderate-intensity exercise (any activity that increases your heart rate and causes you to sweat) each week. In addition, most adults need muscle-strengthening exercises on 2 or more days a week.  Silver Sneakers may be a benefit available to you. To determine eligibility, you may visit the website: www.silversneakers.com or contact program at 970-430-7900 Mon-Fri between 8AM-8PM.   . Maintain a healthy weight. The body mass index (BMI) is a screening tool to identify possible weight problems. It provides an estimate of body fat based on height and weight. Your health care provider can find your BMI and can help you achieve or maintain a healthy weight.   For adults 20 years and older: ? A BMI below 18.5 is considered underweight. ? A BMI of 18.5 to 24.9 is normal. ? A BMI of 25 to 29.9 is considered overweight. ? A BMI of 30 and above is considered obese.   . Maintain normal blood lipids and cholesterol levels by exercising and minimizing your intake of saturated fat. Eat a balanced diet with  plenty of fruit and vegetables. Blood tests for lipids and cholesterol should begin at age 66 and be repeated every 5 years. If your lipid or cholesterol levels are high, you are over 50, or you are at high risk for heart disease, you may need your cholesterol levels checked more frequently. Ongoing high lipid and cholesterol levels should be treated with medicines if diet and exercise are not working.  . If you smoke, find out from your health care provider how to quit. If you do not use tobacco, please do not start.  . If you choose to drink alcohol, please do not consume more than 2 drinks per day. One drink is considered to be 12 ounces (355 mL) of beer, 5 ounces (148 mL) of wine, or 1.5 ounces (44 mL) of liquor.  . If you are 77-48 years old, ask your health care provider if you should take aspirin to prevent strokes.  . Use sunscreen. Apply sunscreen liberally and repeatedly throughout the day. You should seek shade when your shadow is shorter than you. Protect yourself by wearing long sleeves, pants, a wide-brimmed hat, and sunglasses year round, whenever you are outdoors.  . Once a month, do a whole body skin exam, using a mirror to look at the skin on your back. Tell your health care provider of new moles, moles that have irregular borders, moles that are larger than a pencil eraser, or moles that have changed in shape or color.    Allergies Ace inhibitors  Subjective:   Danielle Cruz is a 61 y.o. female who presents [facility].  HPI HPI  Past Medical History:  Diagnosis Date  . Anemia   . 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 Arthritis   . Colonic polyp 2010   Hemorrhoids; h/o mild hematochezia  . CVA (cerebral infarction) 08/2008   sizable left -residual expressive aphasia, right sided weakness; ambulates with difficulty with the right leg brace   . Hyperlipidemia   . Hypertension  2005  . Rheumatoid arthritis(714.0)   . Stroke ([place])   . Tobacco abuse, in remission    Quit in 2010; 30-pack-year history    Past Surgical History:  Procedure Laterality Date  . ANKLE SURGERY     Right  . CAROTID STENT INSERTION    . COLONOSCOPY W/ POLYPECTOMY  11/2008   Dr. Gala Romney. Left-sided diverticula, Pedunculated polyp snared (no adenomatous changes)  . OOPHORECTOMY      Family History  Problem Relation Age of Onset  . Cancer Father        prostate, deceased 11s  . Breast cancer Sister        Deceased 72s  . Heart attack Mother        deceased 9, during childbirth  . Cancer Brother        lymphoma  . Colon cancer Maternal Aunt        older than age 68  . Liver disease Neg Hx      Social History   Socioeconomic History  . Marital status: Married    Spouse name: None  . Number of children: 3  . Years of education: 11th grade  . Highest education level: 11th grade  Social Needs  . Financial resource strain: Not very hard  . Food insecurity - worry: Sometimes true  . Food insecurity - inability: Never true  . Transportation needs - medical: No  . Transportation needs - non-medical: No  Occupational History  . Occupation: disabilty x 9years    Comment: secondary to arthritis   Tobacco Use  . Smoking status: Current Every Day Smoker    Packs/day: 0.50    Types: Cigarettes  . Smokeless tobacco: Never Used  . Tobacco comment: current 15  Substance and Sexual Activity  . Alcohol use: No    Alcohol/week: 0.0 oz    Comment: quit 6 years ago   . Drug use: No  . Sexual activity: None  Other Topics Concern  . None  Social History Narrative   Pt gave birth to 4 children, 1 deceased at 40 weeks was a premature baby, 3 are living ages range 77 to 82 all healthy     Review of Systems   Objective:   BP 128/80 (BP Location: Left Arm, Patient Position: Sitting, Cuff Size: Normal)   Pulse 80   Temp (!) 97.5 F (36.4 C) (Temporal)   Resp 16   Ht '5\' 7"'$  (1.702  m)   Wt 152 lb 1.9 oz (69 kg)   SpO2 100%   BMI 23.83 kg/m   Physical Exam   Assessment and Plan   There are no diagnoses linked to this encounter.   No Follow-up on file. Ova Freshwater, LPN [id number]/3009     Subjective:   Danielle Cruz is a 61 y.o. female who presents for an Initial Medicare Annual Wellness Visit.  Review of Systems      Cardiac Risk Factors include: sedentary lifestyle;hypertension;smoking/ tobacco exposure     Objective:    Today's Vitals   03/21/17 0830  BP: 128/80  Pulse: 80  Resp: 16  Temp: (!) 97.5 F (36.4 C)  TempSrc: Temporal  SpO2: 100%  Weight: 152 lb 1.9 oz (69 kg)  Height: '5\' 7"'$  (1.702 m)  PainSc: 0-No pain   Body mass index is 23.83 kg/m.  Advanced Directives 03/21/2017 11/28/2013  Does Patient Have a Medical Advance Directive? No No  Would patient like information on creating a medical advance directive? No - Patient declined Yes - Educational materials given    Current Medications (verified) Outpatient Encounter Medications as of 03/21/2017  Medication Sig  . aspirin EC 81 MG tablet Take 81 mg by mouth daily.  Marland Kitchen atorvastatin (LIPITOR) 80 MG tablet TAKE 1 TABLET(80 MG) BY MOUTH DAILY  . chlorthalidone (HYGROTON) 25 MG tablet TAKE 1/2 TABLET BY MOUTH EVERY DAY  . Cholecalciferol (VITAMIN D) 400 UNITS capsule Take 400 Units by mouth daily. Take 1 tablet by mouth daily   . FERROUS GLUCONATE IRON PO Take 325 mg by mouth daily.   . furosemide (LASIX) 40 MG tablet TAKE 1 TABLET BY MOUTH DAILY  . leflunomide (ARAVA) 20 MG tablet Take 20 mg by mouth daily.    . metoprolol succinate (TOPROL-XL) 25 MG 24 hr tablet TAKE 1 TABLET BY MOUTH EVERY DAY  . metoprolol succinate (TOPROL-XL) 25 MG 24 hr tablet Take 1 tablet (25 mg total) daily by mouth.  . montelukast (SINGULAIR) 10 MG tablet Take 1 tablet (10 mg total) by mouth at bedtime.  Marland Kitchen oxybutynin (DITROPAN) 5 MG tablet TAKE 1/2 TABLET BY MOUTH TWICE DAILY  . potassium chloride SA  (K-DUR,KLOR-CON) 20 MEQ tablet TAKE 1 TABLET BY MOUTH EVERY DAY  . spironolactone (ALDACTONE) 25 MG tablet TAKE 1 TABLET(25 MG) BY MOUTH DAILY   No facility-administered encounter medications on file as of 03/21/2017.     Allergies (verified) Ace inhibitors   History: Past Medical History:  Diagnosis Date  . Anemia   . 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 Arthritis   . Colonic polyp 2010   Hemorrhoids; h/o mild hematochezia  . CVA (cerebral infarction) 08/2008   sizable left -residual expressive aphasia, right sided weakness; ambulates with difficulty with the right leg brace   . Hyperlipidemia   . Hypertension 2005  . Rheumatoid arthritis(714.0)   . Stroke ([place])   . Tobacco abuse, in remission    Quit in 2010; 30-pack-year history   Past Surgical History:  Procedure Laterality Date  . ANKLE SURGERY     Right  . CAROTID STENT INSERTION    . COLONOSCOPY W/ POLYPECTOMY  11/2008   Dr. Gala Romney. Left-sided diverticula, Pedunculated polyp snared (no adenomatous changes)  . OOPHORECTOMY     Family History  Problem Relation Age of Onset  . Cancer Father        prostate, deceased 10s  . Breast cancer Sister        Deceased 51s  . Heart attack Mother        deceased 18, during childbirth  . Cancer Brother        lymphoma  . Colon cancer Maternal Aunt        older than age 68  . Liver disease Neg Hx    Social History   Socioeconomic History  . Marital status: Married    Spouse name: None  . Number of children: 3  . Years of education: 11th grade  . Highest education level: 11th grade  Social Needs  . Financial resource strain: Not very hard  . Food insecurity - worry:  Sometimes true  . Food insecurity - inability: Never true  . Transportation needs - medical: No  . Transportation needs - non-medical: No  Occupational History  . Occupation: disabilty x 9years    Comment: secondary to arthritis     Tobacco Use  . Smoking status: Current Every Day Smoker    Packs/day: 0.50    Types: Cigarettes  . Smokeless tobacco: Never Used  . Tobacco comment: current 15  Substance and Sexual Activity  . Alcohol use: No    Alcohol/week: 0.0 oz    Comment: quit 6 years ago   . Drug use: No  . Sexual activity: None  Other Topics Concern  . None  Social History Narrative   Pt gave birth to 4 children, 1 deceased at 71 weeks was a premature baby, 3 are living ages range 36 to 62 all healthy     Tobacco Counseling Ready to quit: No Counseling given: Yes Comment: current 15   Clinical Intake:     Pain : No/denies pain Pain Score: 0-No pain     Diabetes: No  Activities of Daily Living: (!) Needs Assist Feeding: Needs assistance Dressing/Grooming: Needs assistance Bathing: Needs assistance Toileting: Needs assistance Transfer: Needs assistance Ambulation: Needs Assistance Medication Administration: Needs assistance (comment) Is this a change from baseline?: Pre-admission baseline Home Management: Needs assistance (comment)     Do you feel unsafe in your current relationship?: No Do you feel physically threatened by others?: No Anyone hurting you at home, work, or school?: No Unable to ask?: No Information provided on Community resources: No  How often do you need to have someone help you when you read instructions, pamphlets, or other written materials from your doctor or pharmacy?: 1 - Never What is the last grade level you completed in school?: 11th grade  Interpreter Needed?: No      Activities of Daily Living In your present state of health, do you have any difficulty performing the following activities: 03/21/2017  Hearing? N  Vision? N  Difficulty concentrating or making decisions? N  Walking or climbing stairs? Y  Dressing or bathing? Y  Doing errands, shopping? Y  Preparing Food and eating ? N  Using the Toilet? N  In the past six months, have you  accidently leaked urine? N  Do you have problems with loss of bowel control? N  Managing your Medications? N  Managing your Finances? N  Housekeeping or managing your Housekeeping? N  Some recent data might be hidden      Immunizations and Health Maintenance Immunization History  Administered Date(s) Administered  . Influenza Whole 01/01/2010, 03/09/2010, 01/11/2011  . Influenza,inj,Quad PF,6+ Mos 02/09/2013, 05/09/2014, 05/07/2016, 01/31/2017  . Pneumococcal Conjugate-13 11/28/2013  . Tdap 01/11/2011   Health Maintenance Due  Topic Date Due  . MAMMOGRAM  02/05/2017    Patient Care Team: Fayrene Helper, MD as PCP - Hardin Negus, MD (Internal Medicine) Gala Romney Cristopher Estimable, MD as Consulting Physician (Gastroenterology) Harl Bowie Alphonse Guild, MD as Consulting Physician (Cardiology)  Indicate any recent Medical Services you may have received from other than [facility] providers in the past year (date may be approximate).     Assessment:   This is a routine wellness examination for Ismael.   Hearing/Vision screen No exam data present  Dietary issues and exercise activities discussed: Exercise limited by: Other - see comments(stroke)  Goals    None     Depression Screen [facility] 2/9 Scores 03/21/2017 09/27/2016 11/28/2013 06/28/2013  PHQ -  2 Score 0 0 0 0    Fall Risk Fall Risk  03/21/2017 12/02/2015 11/28/2013 06/28/2013  Falls in the past year? No No No No  Risk for fall due to : - - - Impaired mobility    Is the patient's home free of loose throw rugs in walkways, pet beds, electrical cords, etc?   yes      Grab bars in the bathroom? no      Handrails on the stairs?   n/a      Adequate lighting?   yes  Cognitive Function:     6CIT Screen 03/21/2017  What Year? 0 points  What month? 0 points  What time? 0 points  Count back from 20 0 points  Months in reverse 0 points  Repeat phrase 0 points  Total Score 0    Screening Tests Health Maintenance  Topic Date  Due  . MAMMOGRAM  02/05/2017  . COLONOSCOPY  11/30/2018  . PAP SMEAR  05/05/2019  . TETANUS/TDAP  01/10/2021  . INFLUENZA VACCINE  Completed  . Hepatitis C Screening  Completed  . HIV Screening  Completed    Cancer Screenings: Lung:  Low Dose CT Chest recommended if Age 5-80 years, 30 pack-year currently smoking OR have quit w/in 15years. Patient does qualify. Breast:  Up to date on Mammogram? yes  Up to date of Bone Density/Dexa? no   Additional Screenings: Hepatitis B/HIV/Syphillis:done Hepatitis C Screening: done     Plan:     I have personally reviewed and noted the following in the patient's chart:   . Medical and social history . Use of alcohol, tobacco or illicit drugs  . Current medications and supplements . Functional ability and status . Nutritional status . Physical activity . Advanced directives . List of other physicians . Hospitalizations, surgeries, and ER visits in previous 12 months . Vitals . Screenings to include cognitive, depression, and falls . Referrals and appointments  In addition, I have reviewed and discussed with patient certain preventive protocols, quality metrics, and best practice recommendations. A written personalized care plan for preventive services as well as general preventive health recommendations were provided to patient.     Ova Freshwater, LPN   [id number]

## 2017-04-28 ENCOUNTER — Other Ambulatory Visit: Payer: Self-pay | Admitting: Family Medicine

## 2017-05-16 DIAGNOSIS — M2550 Pain in unspecified joint: Secondary | ICD-10-CM | POA: Diagnosis not present

## 2017-05-16 DIAGNOSIS — G8929 Other chronic pain: Secondary | ICD-10-CM | POA: Diagnosis not present

## 2017-05-16 DIAGNOSIS — M25579 Pain in unspecified ankle and joints of unspecified foot: Secondary | ICD-10-CM | POA: Diagnosis not present

## 2017-05-16 DIAGNOSIS — M0579 Rheumatoid arthritis with rheumatoid factor of multiple sites without organ or systems involvement: Secondary | ICD-10-CM | POA: Diagnosis not present

## 2017-05-16 DIAGNOSIS — R609 Edema, unspecified: Secondary | ICD-10-CM | POA: Diagnosis not present

## 2017-05-16 DIAGNOSIS — R748 Abnormal levels of other serum enzymes: Secondary | ICD-10-CM | POA: Diagnosis not present

## 2017-05-16 DIAGNOSIS — Z79899 Other long term (current) drug therapy: Secondary | ICD-10-CM | POA: Diagnosis not present

## 2017-05-19 ENCOUNTER — Ambulatory Visit: Payer: Medicare Other | Admitting: Cardiology

## 2017-05-31 ENCOUNTER — Other Ambulatory Visit: Payer: Self-pay | Admitting: Family Medicine

## 2017-05-31 DIAGNOSIS — E785 Hyperlipidemia, unspecified: Secondary | ICD-10-CM

## 2017-05-31 DIAGNOSIS — Z1231 Encounter for screening mammogram for malignant neoplasm of breast: Secondary | ICD-10-CM

## 2017-05-31 DIAGNOSIS — I10 Essential (primary) hypertension: Secondary | ICD-10-CM

## 2017-06-02 ENCOUNTER — Other Ambulatory Visit: Payer: Self-pay

## 2017-06-02 MED ORDER — SPIRONOLACTONE 25 MG PO TABS: ORAL_TABLET | ORAL | 0 refills | Status: DC

## 2017-06-02 NOTE — Telephone Encounter (Signed)
Pt needed f/u apt in March 2018, has not been seen, requests refill.#30 spironolactone refilled. Message front office staff to schedule apt

## 2017-06-21 ENCOUNTER — Other Ambulatory Visit: Payer: Self-pay | Admitting: Cardiology

## 2017-06-22 ENCOUNTER — Ambulatory Visit: Payer: Medicare Other | Admitting: Family Medicine

## 2017-06-27 ENCOUNTER — Other Ambulatory Visit: Payer: Self-pay

## 2017-06-27 DIAGNOSIS — Z1231 Encounter for screening mammogram for malignant neoplasm of breast: Secondary | ICD-10-CM

## 2017-06-27 DIAGNOSIS — I10 Essential (primary) hypertension: Secondary | ICD-10-CM

## 2017-06-27 DIAGNOSIS — E785 Hyperlipidemia, unspecified: Secondary | ICD-10-CM

## 2017-06-27 MED ORDER — METOPROLOL SUCCINATE ER 25 MG PO TB24: 25.0000 mg | ORAL_TABLET | Freq: Every day | ORAL | 3 refills | Status: DC

## 2017-06-27 NOTE — Telephone Encounter (Signed)
Refilled toprol

## 2017-06-28 ENCOUNTER — Other Ambulatory Visit: Payer: Self-pay | Admitting: Family Medicine

## 2017-06-28 DIAGNOSIS — Z1231 Encounter for screening mammogram for malignant neoplasm of breast: Secondary | ICD-10-CM

## 2017-06-28 DIAGNOSIS — E785 Hyperlipidemia, unspecified: Secondary | ICD-10-CM

## 2017-06-28 DIAGNOSIS — I10 Essential (primary) hypertension: Secondary | ICD-10-CM

## 2017-06-30 ENCOUNTER — Telehealth: Payer: Self-pay

## 2017-06-30 ENCOUNTER — Encounter: Payer: Self-pay | Admitting: Cardiology

## 2017-06-30 ENCOUNTER — Ambulatory Visit (INDEPENDENT_AMBULATORY_CARE_PROVIDER_SITE_OTHER): Payer: Medicare Other | Admitting: Cardiology

## 2017-06-30 VITALS — BP 138/78 | HR 88 | Ht 67.0 in | Wt 155.0 lb

## 2017-06-30 DIAGNOSIS — E782 Mixed hyperlipidemia: Secondary | ICD-10-CM

## 2017-06-30 DIAGNOSIS — I2510 Atherosclerotic heart disease of native coronary artery without angina pectoris: Secondary | ICD-10-CM

## 2017-06-30 DIAGNOSIS — Z8673 Personal history of transient ischemic attack (TIA), and cerebral infarction without residual deficits: Secondary | ICD-10-CM | POA: Diagnosis not present

## 2017-06-30 DIAGNOSIS — R600 Localized edema: Secondary | ICD-10-CM | POA: Diagnosis not present

## 2017-06-30 MED ORDER — ATORVASTATIN CALCIUM 40 MG PO TABS: 40.0000 mg | ORAL_TABLET | Freq: Every day | ORAL | 3 refills | Status: DC

## 2017-06-30 NOTE — Telephone Encounter (Signed)
Please advise dr simpsons response below

## 2017-06-30 NOTE — Telephone Encounter (Signed)
Needs to go to eD

## 2017-06-30 NOTE — Telephone Encounter (Signed)
Called back and no answer and voicemail had not been set up yet

## 2017-06-30 NOTE — Progress Notes (Signed)
Clinical Summary Danielle Cruz is a 62 y.o.female seen today for follow up of the following medical problems.   1. CAD - prior stenting in 2005 to LAD and OM - echo 08/2008 LVEF 55-65%, no WMAs   - no recent chest pain - compliant with meds  2. Hx of CVA - aphasic, has been on ASA and statin for secondary prevention  3. Hyperlipidemia  - 01/2017 TC 142 HDL 30 TG 187 LDL 84 - compliant with meds  4. HTN - has not taken meds yet, but overall compliant  5. LE edema - improved with lasix   Past Medical History:  Diagnosis Date  . Anemia   . 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 Arthritis   . Colonic polyp 2010   Hemorrhoids; h/o mild hematochezia  . CVA (cerebral infarction) 08/2008   sizable left -residual expressive aphasia, right sided weakness; ambulates with difficulty with the right leg brace   . Hyperlipidemia   . Hypertension 2005  . Rheumatoid arthritis(714.0)   . Stroke ([place])   . Tobacco abuse, in remission    Quit in 2010; 30-pack-year history     Allergies  Allergen Reactions  . Ace Inhibitors Cough    New daily cough since starting ACE     Current Outpatient Medications  Medication Sig Dispense Refill  . aspirin EC 81 MG tablet Take 81 mg by mouth daily.    Marland Kitchen atorvastatin (LIPITOR) 80 MG tablet TAKE 1 TABLET(80 MG) BY MOUTH DAILY 90 tablet 1  . chlorthalidone (HYGROTON) 25 MG tablet TAKE 1/2 TABLET BY MOUTH EVERY DAY 60 tablet 6  . Cholecalciferol (VITAMIN D) 400 UNITS capsule Take 400 Units by mouth daily. Take 1 tablet by mouth daily     . FERROUS GLUCONATE IRON PO Take 325 mg by mouth daily.     . furosemide (LASIX) 40 MG tablet TAKE 1 TABLET BY MOUTH DAILY 30 tablet 11  . leflunomide (ARAVA) 20 MG tablet Take 20 mg by mouth daily.      . metoprolol succinate (TOPROL-XL) 25 MG 24 hr tablet Take 1 tablet (25 mg total) daily by mouth. 90 tablet 0  . metoprolol  succinate (TOPROL-XL) 25 MG 24 hr tablet Take 1 tablet (25 mg total) by mouth daily. 30 tablet 3  . montelukast (SINGULAIR) 10 MG tablet TAKE 1 TABLET(10 MG) BY MOUTH AT BEDTIME 30 tablet 0  . oxybutynin (DITROPAN) 5 MG tablet TAKE 1/2 TABLET BY MOUTH TWICE DAILY 90 tablet 0  . potassium chloride SA (K-DUR,KLOR-CON) 20 MEQ tablet TAKE 1 TABLET BY MOUTH EVERY DAY 90 tablet 3  . spironolactone (ALDACTONE) 25 MG tablet TAKE 1 TABLET(25 MG) BY MOUTH DAILY 30 tablet 0   No current facility-administered medications for this visit.      Past Surgical History:  Procedure Laterality Date  . ANKLE SURGERY     Right  . CAROTID STENT INSERTION    . COLONOSCOPY W/ POLYPECTOMY  11/2008   Dr. Gala Romney. Left-sided diverticula, Pedunculated polyp snared (no adenomatous changes)  . OOPHORECTOMY       Allergies  Allergen Reactions  . Ace Inhibitors Cough    New daily cough since starting ACE      Family History  Problem Relation Age of Onset  . Cancer Father        prostate, deceased 17s  . Breast cancer Sister  Deceased 90s  . Heart attack Mother        deceased 21, during childbirth  . Cancer Brother        lymphoma  . Colon cancer Maternal Aunt        older than age 7  . Liver disease Neg Hx      Social History [name] reports that she has been smoking cigarettes.  She has been smoking about 0.50 packs per day. she has never used smokeless tobacco. [name] reports that she does not drink alcohol.   Review of Systems CONSTITUTIONAL: No weight loss, fever, chills, weakness or fatigue.  HEENT: Eyes: No visual loss, blurred vision, double vision or yellow sclerae.No hearing loss, sneezing, congestion, runny nose or sore throat.  SKIN: No rash or itching.  CARDIOVASCULAR: per hpi RESPIRATORY: No shortness of breath, cough or sputum.  GASTROINTESTINAL: No anorexia, nausea, vomiting or diarrhea. No abdominal pain or blood.  GENITOURINARY: No burning on urination, no  polyuria NEUROLOGICAL: chronic aphasia, weakness MUSCULOSKELETAL: No muscle, back pain, joint pain or stiffness.  LYMPHATICS: No enlarged nodes. No history of splenectomy.  PSYCHIATRIC: No history of depression or anxiety.  ENDOCRINOLOGIC: No reports of sweating, cold or heat intolerance. No polyuria or polydipsia.  Marland Kitchen   Physical Examination Vitals:   06/30/17 0840  BP: 138/78  Pulse: 88  SpO2: 96%   Vitals:   06/30/17 0840  Weight: 155 lb (70.3 kg)  Height: 5\' 7"  (1.702 m)    Gen: resting comfortably, no acute distress HEENT: no scleral icterus, pupils equal round and reactive, no palptable cervical adenopathy,  CV: RRR, no m//rg, no jvd Resp: Clear to auscultation bilaterally GI: abdomen is soft, non-tender, non-distended, normal bowel sounds, no hepatosplenomegaly MSK: extremities are warm, no edema.  Skin: warm, no rash Psych: appropriate affect   Diagnostic Studies 07/2003 Cath RESULTS:  HEMODYNAMICS:  1. Left ventricular pressure 145/12.  2. Aortic pressure 150/82.  3. There was no aortic valve gradient.  LEFT VENTRICULOGRAM: Wall motion is normal. Ejection fraction estimated at  greater than or equal to 65%. There is no mitral regurgitation.  CORONARY ARTERIOGRAPHY (CO-DOMINANT):  1. Left main is normal.  2. Left anterior descending artery has a tubular 20% stenosis in the  proximal to mid vessel. The distal LAD has a tubular 80% stenosis. The  apical LAD has a tubular 80% stenosis. The LAD gives rise to a small  diagonal Danielle Cruz.  3. The left circumflex was a large co-dominant vessel. It gives rise to a  large first obtuse marginal Danielle Cruz. There is a 90% stenosis in the mid  portion of the first obtuse marginal Danielle Cruz. There is haziness  associated with this lesion, and it has the appearance of a positive  ruptured plaque. There is a normal sized second obtuse marginal Danielle Cruz,  which has a 50% stenosis proximally and a 60% stenosis distally.  The  distal circumflex also gives rise to 2 small posterolateral branches.  4. The right coronary artery is a relatively small co-dominant vessel.  There is diffuse 60% stenosis in the proximal vessel. In the mid vessel  at the acute margin, there is a 20% stenosis and the distal vessel has a  20% stenosis. There is a small to normal sized acute marginal Bently Morath,  which has a 70% at its origin. The distal right coronary artery also  gives rise to a small posterior descending coronary artery and a small  posterolateral Bellany Elbaum.  IMPRESSION:  1. Normal left ventricular  systolic function.  2. Three-vessel coronary artery disease, as described. The culprit lesion  appears to be the 90% stenosis in the first obtuse marginal Denzell Colasanti.  There is also significant disease in the distal LAD and moderate, but  nonobstructive, disease in the small right coronary artery.  PLAN: Percutaneous intervention of the obtuse marginal and the LAD, see  below.  PTCA PROCEDURAL NOTE: Following completion of diagnostic catheterization,  we proceeded with percutaneous coronary intervention. We utilized the  preexisting #6 French sheath in the right femoral artery. Heparin and  Integrilin were administered per protocol. We used a #6 [place] LS 3.5  guiding catheter. A BMW wire was advanced under fluoroscopic guidance into  the distal portion of the first obtuse marginal Lilit Cinelli. We then performed  PTCA of the 90% stenosis in the obtuse marginal with a 2.5 x 12 mm Quantum  balloon inflated to 10 atmospheres. We then positioned a 2.5 x 13 mm CYPHER  drug-eluting stent across the area of diseased vessel and deployed the stent  at 15 atmospheres. We then went back with a 2.5 x 12 mm Quantum balloon  within the stent and inflated this balloon to 22 atmospheres. The final  angiographic images were obtained revealing patency of the obtuse marginal  Shanessa Hodak with 0% residual stenosis and TIMI 3 flow.   We then turned our attention to the LAD. The BMW wire was advanced under  fluoroscopic guidance into the apical portion of the LAD. We then performed  PTCA of the 80% stenosis in the distal LAD with a 2.0 x 15 mm Quantum  balloon inflated to 12 atmospheres. We then positioned a 2.0 x 15 mm Mini  Vision bare metal stent across the area of segment of disease and deployed  the stent at 12 atmospheres. We then went back with a 2.25 x 12 mm Quantum  balloon and positioning this within the stent, we inflated this balloon to  14 atmospheres in the proximal and distal edges of the stent. Final  angiographic images are obtained revealing patency of the LAD at the stent  site and with 0% residual stenosis and TIMI 3 flow.  COMPLICATIONS: None.  RESULTS:  1. Successful PTCA with placement of a drug-eluting stent in the first  obtuse marginal Larissa Pegg. A 90% stenosis with haziness was reduced to 0%  residual with TIMI 3 flow.  2. Successful PTCA with placement of a bare metal stent in the distal left  anterior descending artery. A tubular 80% stenosis was reduced to 0%  residual with TIMI flow.  PLAN: Integrilin will be continued for 18 hours. It is recommended that  the patient will be treated with Plavix for a minimum of 6-9 months. She  also needs aggressive risk factor modification. Regarding the residual  disease in the apical LAD, at this point, would recommend continue medical  therapy. If she has recurrent chest pain, which is refractor to medical  therapy, percutaneous coronary intervention of the apical LAD could be  considered.  08/2008 Echo Study Conclusions  1. Left ventricle: The cavity size was normal. Systolic function was normal. The estimated ejection fraction was in the range of 55% to 65%. Wall motion was normal; there were no regional wall motion abnormalities. 2. Aortic valve: Trivial regurgitation. 3. Mitral valve: No evidence of vegetation. 4.  Left atrium: No evidence of thrombus in the atrial cavity or appendage. 5. Atrial septum: Echo contrast study showed no right-to-left atrial level shunt, in the baseline state (Late bubbles felt  to be nondiagnostic) There was an atrial septal aneurysm.  06/2015 echo Study Conclusions  - Left ventricle: The cavity size was normal. Wall thickness was increased in a pattern of mild LVH. Systolic function was normal. The estimated ejection fraction was in the range of 60% to 65%. Wall motion was normal; there were no regional wall motion abnormalities. Doppler parameters are consistent with abnormal left ventricular relaxation (grade 1 diastolic dysfunction). - Atrial septum: No defect or patent foramen ovale was identified.     Assessment and Plan  1. CAD - no symptoms, continue current meds - EKG today shows SR, no ischemic changes   2. Hx of CVA - she will continue secondary prevention  3. Hyperlipidemia - not at goal. In setting of CAD and prior stroke, change simva to atorva 40mg  daily.   4. HTN -mildly elevated in clinic but has not taken meds yet today. Continue to monitor.   5. LE edema She will continue diuretic      Arnoldo Lenis, M.D.

## 2017-06-30 NOTE — Telephone Encounter (Signed)
Daughter called and said since yesterday her mom has been in the bed and sleeping a lot, feels weak and has a cough. 99.4 temp and vitals are stable. She was around her sick grandaughter and may have caught it. Wanted her to be seen or labs done. Please advise [phone]

## 2017-06-30 NOTE — Patient Instructions (Signed)
Medication Instructions:  Stop simvistatin  Start atorvastatin 40 mg daily   Take lasix 40 mg daily AS NEEDED FOR SWELLING  Labwork: NONE  Testing/Procedures: NONE  Follow-Up: Your physician wants you to follow-up in: 6 MONTHS.  You will receive a reminder letter in the mail two months in advance. If you don't receive a letter, please call our office to schedule the follow-up appointment.   Any Other Special Instructions Will Be Listed Below (If Applicable).     If you need a refill on your cardiac medications before your next appointment, please call your pharmacy.

## 2017-07-01 ENCOUNTER — Other Ambulatory Visit: Payer: Self-pay

## 2017-07-01 ENCOUNTER — Ambulatory Visit (INDEPENDENT_AMBULATORY_CARE_PROVIDER_SITE_OTHER): Payer: Medicare Other | Admitting: Family Medicine

## 2017-07-01 ENCOUNTER — Encounter: Payer: Self-pay | Admitting: Family Medicine

## 2017-07-01 VITALS — BP 150/70 | HR 78 | Temp 98.2°F | Resp 16 | Wt 148.2 lb

## 2017-07-01 DIAGNOSIS — R05 Cough: Secondary | ICD-10-CM | POA: Diagnosis not present

## 2017-07-01 DIAGNOSIS — J069 Acute upper respiratory infection, unspecified: Secondary | ICD-10-CM | POA: Diagnosis not present

## 2017-07-01 DIAGNOSIS — R059 Cough, unspecified: Secondary | ICD-10-CM

## 2017-07-01 MED ORDER — AZITHROMYCIN 250 MG PO TABS: ORAL_TABLET | ORAL | 0 refills | Status: DC

## 2017-07-01 MED ORDER — BENZONATATE 100 MG PO CAPS: 100.0000 mg | ORAL_CAPSULE | Freq: Two times a day (BID) | ORAL | 0 refills | Status: DC | PRN

## 2017-07-01 NOTE — Progress Notes (Signed)
Chief Complaint  Patient presents with  . Cough  . Fatigue   Patient is here for a sick visit.  Usual PCP is Tula Nakayama MD. Patient is brought in by her husband.  She is largely aphasic after a stroke with communication difficulties.  She can answer yes and no.  She seems to comprehend questions.  Her husband fills in the additional history. They had a grandchild visit last week.  He was symptomatic with cough and runny nose.  3 or 4 days ago Ladonne started having some nasal congestion, runny nose, postnasal drip.  She now has a harsh cough.  She is coughing up a little bit of phlegm.  She feels very weak and tired.  She is not eating for 2 days.  She is drinking liquids.  Denies any chest pain.  Denies any sweats chills or fever.  Denies any history of underlying asthma or COPD.  She is immune compromised with her treatment for rheumatoid arthritis.  No one else in the household at this time is sick.  She did get her flu shot, and has had her pneumonia vaccine.   Patient Active Problem List   Diagnosis Date Noted  . At high risk for falls 01/31/2017  . Disorder of bone and cartilage 05/11/2016  . Normocytic anemia 01/08/2015  . Elevated ferritin 01/08/2015  . LUQ fullness 01/08/2015  . CAD in native artery 12/07/2014  . Bowel habit changes 12/07/2014  . Cough 12/07/2014  . Fasting hyperglycemia 11/05/2011  . Elevated transaminase level 01/08/2011  . Tobacco abuse   . Rheumatoid arthritis ([place])   . Colonic polyp   . 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 Cardiovascular disease 01/14/2010  . Anemia 04/01/2009  . Hyperlipemia 11/14/2008  . Essential hypertension 11/14/2008    Outpatient Encounter Medications as of 07/01/2017  Medication Sig  . aspirin EC 81 MG tablet Take 81 mg by mouth daily.  Marland Kitchen atorvastatin (LIPITOR) 40 MG tablet Take 1 tablet (40 mg total) by mouth daily.  . baclofen (LIORESAL) 10 MG tablet Take 10 mg by mouth 2 (two)  times daily.  . Cholecalciferol (VITAMIN D) 400 UNITS capsule Take 400 Units by mouth daily. Take 1 tablet by mouth daily   . FERROUS GLUCONATE IRON PO Take 325 mg by mouth daily.   . folic acid (FOLVITE) 1 MG tablet TK 1 T PO QD  . furosemide (LASIX) 40 MG tablet TAKE 1 TABLET BY MOUTH DAILY (Patient taking differently: TAKE 1 TABLET DAILY AS NEEDED FOR SWELLING)  . hydrochlorothiazide (HYDRODIURIL) 25 MG tablet Take 25 mg by mouth daily.  Marland Kitchen HYDROcodone-acetaminophen (NORCO/VICODIN) 5-325 MG tablet TK 1 T PO ONCE A DAY PRN  . leflunomide (ARAVA) 20 MG tablet Take 20 mg by mouth daily.    . methotrexate (RHEUMATREX) 2.5 MG tablet Take 10 mg by mouth once a week. Caution:Chemotherapy. Protect from light.  . metoprolol succinate (TOPROL-XL) 25 MG 24 hr tablet Take 1 tablet (25 mg total) by mouth daily.  . montelukast (SINGULAIR) 10 MG tablet TAKE 1 TABLET(10 MG) BY MOUTH AT BEDTIME  . oxybutynin (DITROPAN) 5 MG tablet TAKE 1/2 TABLET BY MOUTH TWICE DAILY  . potassium chloride SA (K-DUR,KLOR-CON) 20 MEQ tablet TAKE 1 TABLET BY MOUTH EVERY DAY  . spironolactone (ALDACTONE) 25 MG tablet TAKE 1 TABLET(25 MG) BY MOUTH DAILY  . azithromycin (ZITHROMAX) 250 MG tablet tad  . benzonatate (TESSALON) 100 MG capsule Take 1 capsule (100 mg total) by  mouth 2 (two) times daily as needed for cough.   No facility-administered encounter medications on file as of 07/01/2017.     Allergies  Allergen Reactions  . Ace Inhibitors Cough    New daily cough since starting ACE    Review of Systems  Constitutional: Positive for activity change and fatigue. Negative for appetite change, chills, diaphoresis and fever.  HENT: Positive for congestion, postnasal drip, rhinorrhea and sinus pressure. Negative for sore throat.   Eyes: Negative for photophobia and visual disturbance.  Respiratory: Positive for cough and shortness of breath. Negative for wheezing.   Cardiovascular: Negative for chest pain and palpitations.    Gastrointestinal: Negative for diarrhea, nausea and vomiting.  Genitourinary: Negative for difficulty urinating and frequency.  Neurological: Positive for speech difficulty and weakness.       Focal weakness and aphasia since stroke    BP (!) 150/70 (BP Location: Left Arm, Patient Position: Sitting, Cuff Size: Normal)   Pulse 78   Temp 98.2 F (36.8 C) (Temporal)   Resp 16   Wt 148 lb 4 oz (67.2 kg)   SpO2 98%   BMI 23.22 kg/m   Physical Exam  Constitutional: She appears well-developed and well-nourished.  Ill-appearing.  Tired appearing.  In wheelchair.  Right hemiparesis.  Unintelligible speech.  HENT:  Head: Normocephalic and atraumatic.  Right Ear: External ear normal.  Left Ear: External ear normal.  Mouth/Throat: Oropharynx is clear and moist. No oropharyngeal exudate.  Posterior pharynx mildly injected.  Nasal membranes swollen with yellow crusting at nares  Eyes: Conjunctivae are normal. Pupils are equal, round, and reactive to light.  Neck: Normal range of motion.  Cardiovascular: Normal rate, regular rhythm and normal heart sounds.  Pulmonary/Chest: She has no wheezes.  Mild tachypnea at rest.  No wheezes or rales.  Rhonchi heard anteriorly.  Abdominal: Soft. Bowel sounds are normal.  Lymphadenopathy:    She has no cervical adenopathy.  Neurological: She is alert.  Psychiatric: She has a normal mood and affect. Her behavior is normal.    ASSESSMENT/PLAN:  1. Cough in adult patient   2. Upper respiratory tract infection, unspecified type Discussed that these are often caused by respiratory viruses.  Because of her physical debility, weakness and difficulty clearing secretions, and immune compromise I feel she should be covered with an antibiotic.  Case discussed with Tula Nakayama.  I recommended to husband that she go to the emergency room if she gets worse instead of better, spikes a fever, has difficulty breathing, or any trouble keeping down p.o. liquids  and medicines.   Patient Instructions  Make sure to drink plenty of fluids Take the antibiotic as directed Tessalon is a pill for coughing.  You may take twice a day Call if you are not feeling better in a few days   Raylene Everts, MD

## 2017-07-01 NOTE — Patient Instructions (Signed)
Make sure to drink plenty of fluids Take the antibiotic as directed Tessalon is a pill for coughing.  You may take twice a day Call if you are not feeling better in a few days

## 2017-07-01 NOTE — Telephone Encounter (Signed)
Called again, no answer. No option to leave message, voicemail not set up

## 2017-07-01 NOTE — Telephone Encounter (Signed)
appt scheduled

## 2017-07-04 ENCOUNTER — Encounter: Payer: Self-pay | Admitting: Cardiology

## 2017-07-07 ENCOUNTER — Ambulatory Visit: Payer: Medicare Other | Admitting: Family Medicine

## 2017-07-08 ENCOUNTER — Other Ambulatory Visit: Payer: Self-pay | Admitting: *Deleted

## 2017-07-08 MED ORDER — SPIRONOLACTONE 25 MG PO TABS: ORAL_TABLET | ORAL | 6 refills | Status: DC

## 2017-07-11 ENCOUNTER — Other Ambulatory Visit: Payer: Self-pay | Admitting: Cardiology

## 2017-07-14 ENCOUNTER — Encounter: Payer: Self-pay | Admitting: Family Medicine

## 2017-07-14 ENCOUNTER — Ambulatory Visit (INDEPENDENT_AMBULATORY_CARE_PROVIDER_SITE_OTHER): Payer: Medicare Other | Admitting: Family Medicine

## 2017-07-14 ENCOUNTER — Ambulatory Visit (HOSPITAL_COMMUNITY)
Admission: RE | Admit: 2017-07-14 | Discharge: 2017-07-14 | Disposition: A | Discharge status: Home / Self Care | Payer: Medicare Other | Source: Ambulatory Visit | Attending: Family Medicine | Admitting: Family Medicine

## 2017-07-14 VITALS — BP 124/70 | HR 72 | Temp 98.6°F | Resp 16 | Ht 67.0 in

## 2017-07-14 DIAGNOSIS — E785 Hyperlipidemia, unspecified: Secondary | ICD-10-CM | POA: Diagnosis not present

## 2017-07-14 DIAGNOSIS — Z720 Tobacco use: Secondary | ICD-10-CM | POA: Diagnosis not present

## 2017-07-14 DIAGNOSIS — R059 Cough, unspecified: Secondary | ICD-10-CM

## 2017-07-14 DIAGNOSIS — I2510 Atherosclerotic heart disease of native coronary artery without angina pectoris: Secondary | ICD-10-CM | POA: Diagnosis not present

## 2017-07-14 DIAGNOSIS — J209 Acute bronchitis, unspecified: Secondary | ICD-10-CM

## 2017-07-14 DIAGNOSIS — Z9181 History of falling: Secondary | ICD-10-CM | POA: Diagnosis not present

## 2017-07-14 DIAGNOSIS — I10 Essential (primary) hypertension: Secondary | ICD-10-CM | POA: Diagnosis not present

## 2017-07-14 DIAGNOSIS — Z1231 Encounter for screening mammogram for malignant neoplasm of breast: Secondary | ICD-10-CM | POA: Diagnosis not present

## 2017-07-14 DIAGNOSIS — F17210 Nicotine dependence, cigarettes, uncomplicated: Secondary | ICD-10-CM

## 2017-07-14 DIAGNOSIS — R05 Cough: Secondary | ICD-10-CM

## 2017-07-14 LAB — LIPID PANEL
Cholesterol: 143 mg/dL (ref <200)
HDL: 30 mg/dL — ABNORMAL LOW (ref >50)
LDL Cholesterol (Calc): 83 mg/dL (calc)
Non-HDL Cholesterol (Calc): 113 mg/dL (calc) (ref <130)
Total CHOL/HDL Ratio: 4.8 (calc) (ref <5.0)
Triglycerides: 199 mg/dL — ABNORMAL HIGH (ref <150)

## 2017-07-14 LAB — COMPLETE METABOLIC PANEL WITH GFR
AG Ratio: 1.1 (calc) (ref 1.0–2.5)
ALT: 36 U/L — ABNORMAL HIGH (ref 6–29)
AST: 36 U/L — ABNORMAL HIGH (ref 10–35)
Albumin: 3.6 g/dL (ref 3.6–5.1)
Alkaline phosphatase (APISO): 79 U/L (ref 33–130)
BUN: 10 mg/dL (ref 7–25)
CO2: 26 mmol/L (ref 20–32)
Calcium: 9.5 mg/dL (ref 8.6–10.4)
Chloride: 108 mmol/L (ref 98–110)
Creat: 0.81 mg/dL (ref 0.50–0.99)
GFR, Est African American: 91 mL/min/{1.73_m2} (ref >=60)
GFR, Est Non African American: 78 mL/min/{1.73_m2} (ref >=60)
Globulin: 3.2 g/dL (calc) (ref 1.9–3.7)
Glucose, Bld: 99 mg/dL (ref 65–99)
Potassium: 4.2 mmol/L (ref 3.5–5.3)
Sodium: 140 mmol/L (ref 135–146)
Total Bilirubin: 0.4 mg/dL (ref 0.2–1.2)
Total Protein: 6.8 g/dL (ref 6.1–8.1)

## 2017-07-14 MED ORDER — FLUTICASONE PROPIONATE 50 MCG/ACT NA SUSP: 1.0000 | Freq: Every day | NASAL | 6 refills | Status: DC

## 2017-07-14 NOTE — Patient Instructions (Addendum)
F/u in 4 months, call if you need me before  CXR today and labs   Lipid, cmp and EGFR today  Nasal spray sent for use for allergies  Need to stop smoking to protect heart and lungs and reduce cancer risk   Steps to Quit Smoking Smoking tobacco can be bad for your health. It can also affect almost every organ in your body. Smoking puts you and people around you at risk for many serious long-lasting (chronic) diseases. Quitting smoking is hard, but it is one of the best things that you can do for your health. It is never too late to quit. What are the benefits of quitting smoking? When you quit smoking, you lower your risk for getting serious diseases and conditions. They can include:  Lung cancer or lung disease.  Heart disease.  Stroke.  Heart attack.  Not being able to have children (infertility).  Weak bones (osteoporosis) and broken bones (fractures).  If you have coughing, wheezing, and shortness of breath, those symptoms may get better when you quit. You may also get sick less often. If you are pregnant, quitting smoking can help to lower your chances of having a baby of low birth weight. What can I do to help me quit smoking? Talk with your doctor about what can help you quit smoking. Some things you can do (strategies) include:  Quitting smoking totally, instead of slowly cutting back how much you smoke over a period of time.  Going to in-person counseling. You are more likely to quit if you go to many counseling sessions.  Using resources and support systems, such as: ? Database administrator with a Social worker. ? Phone quitlines. ? Careers information officer. ? Support groups or group counseling. ? Text messaging programs. ? Mobile phone apps or applications.  Taking medicines. Some of these medicines may have nicotine in them. If you are pregnant or breastfeeding, do not take any medicines to quit smoking unless your doctor says it is okay. Talk with your doctor about  counseling or other things that can help you.  Talk with your doctor about using more than one strategy at the same time, such as taking medicines while you are also going to in-person counseling. This can help make quitting easier. What things can I do to make it easier to quit? Quitting smoking might feel very hard at first, but there is a lot that you can do to make it easier. Take these steps:  Talk to your family and friends. Ask them to support and encourage you.  Call phone quitlines, reach out to support groups, or work with a Social worker.  Ask people who smoke to not smoke around you.  Avoid places that make you want (trigger) to smoke, such as: ? Bars. ? Parties. ? Smoke-break areas at work.  Spend time with people who do not smoke.  Lower the stress in your life. Stress can make you want to smoke. Try these things to help your stress: ? Getting regular exercise. ? Deep-breathing exercises. ? Yoga. ? Meditating. ? Doing a body scan. To do this, close your eyes, focus on one area of your body at a time from head to toe, and notice which parts of your body are tense. Try to relax the muscles in those areas.  Download or buy apps on your mobile phone or tablet that can help you stick to your quit plan. There are many free apps, such as QuitGuide from the [employer] Office manager for Disease Control  and Prevention). You can find more support from smokefree.gov and other websites.  This information is not intended to replace advice given to you by your health care provider. Make sure you discuss any questions you have with your health care provider. Document Released: 01/30/2009 Document Revised: 12/02/2015 Document Reviewed: 08/20/2014 Elsevier Interactive Patient Education  2018 Elsevier Inc.  

## 2017-07-15 NOTE — Progress Notes (Signed)
Results given to husband (DPR) w/ verbal understanding. Will also mail letter w/ results.

## 2017-07-18 ENCOUNTER — Encounter: Payer: Self-pay | Admitting: Family Medicine

## 2017-07-18 NOTE — Assessment & Plan Note (Signed)
Controlled, no change in medication  

## 2017-07-18 NOTE — Assessment & Plan Note (Signed)
3 week h/o cough with sputum, likely allergy based currently, needs CXR as long h/o nicotine use also

## 2017-07-18 NOTE — Assessment & Plan Note (Addendum)
Hyperlipidemia:Low fat diet discussed and encouraged.   Lipid Panel  Lab Results  Component Value Date   CHOL 143 07/14/2017   HDL 30 (L) 07/14/2017   LDLCALC 83 07/14/2017   TRIG 199 (H) 07/14/2017   CHOLHDL 4.8 07/14/2017  uncontrolled with elevated TG , needs to reduce fat intake, also needs lower LDL due to CAD

## 2017-07-18 NOTE — Assessment & Plan Note (Signed)
Asked: confirms current cigarettes smoking Advised: needs to quit , already has booth CAD and CVA, needs to reduce recurrence risk and Cancer risk Assess: unwilling to set quit date but reducing use Assist: written info provided, counseled for 5 mins on quit smoking tips Arrange: f/u in 4 months

## 2017-07-18 NOTE — Assessment & Plan Note (Signed)
Home safety reviewed with patient and her spouse

## 2017-07-18 NOTE — Progress Notes (Signed)
   Autry C Brodbeck     MRN: [id number]      DOB: 1955-05-14   HPI Ms. Overdorf is here for follow up and re-evaluation of chronic medical conditions, medication management and review of any available recent lab and radiology data.  Preventive health is updated, specifically  Cancer screening and Immunization.   Questions or concerns regarding consultations or procedures which the PT has had in the interim are  addressed. The PT denies any adverse reactions to current medications since the last visit.  C/o 3 week h/o cough with white sputum, recently completed antibiotic course, improved, but not fully recovered   ROS Denies recent fever or chills. Denies sinus pressure, nasal congestion, ear pain or sore throat. . Denies chest pains, palpitations and leg swelling Denies abdominal pain, nausea, vomiting,diarrhea or constipation.   Denies dysuria, frequency, hesitancy or incontinence.  Denies headaches, seizures, numbness, or tingling. Denies depression, anxiety or insomnia. Denies skin break down or rash.   PE  BP 124/70   Pulse 72   Temp 98.6 F (37 C) (Oral)   Resp 16   SpO2 96%   Patient alert and oriented and in no cardiopulmonary distress.  HEENT: No facial asymmetry, EOMI,   oropharynx pink and moist.  Neck supple no JVD, no mass.  Chest: Decreased air entry, scattered crackles and few wheezes  CVS: S1, S2 no murmurs, no S3.Regular rate.  ABD: Soft non tender.   Ext: No edema  MS: decreased  ROM spine, shoulders, hips and knees.  Skin: Intact, no ulcerations or rash noted.  Psych: Good eye contact, normal affect. Memory intact not anxious or depressed appearing.  CNS: CN 2-12 intact, power,  normal throughout.no focal deficits noted.   Assessment & Plan  Cough 3 week h/o cough with sputum, likely allergy based currently, needs CXR as long h/o nicotine use also  At high risk for falls Home safety reviewed with patient and her spouse  Essential  hypertension Controlled, no change in medication   Tobacco abuse Asked: confirms current cigarettes smoking Advised: needs to quit , already has booth CAD and CVA, needs to reduce recurrence risk and Cancer risk Assess: unwilling to set quit date but reducing use Assist: written info provided, counseled for 5 mins on quit smoking tips Arrange: f/u in 4 months  Hyperlipemia Hyperlipidemia:Low fat diet discussed and encouraged.   Lipid Panel  Lab Results  Component Value Date   CHOL 143 07/14/2017   HDL 30 (L) 07/14/2017   LDLCALC 83 07/14/2017   TRIG 199 (H) 07/14/2017   CHOLHDL 4.8 07/14/2017  uncontrolled with elevated LDL, dose adjustment needed

## 2017-07-21 ENCOUNTER — Ambulatory Visit (HOSPITAL_COMMUNITY): Payer: Medicare Other

## 2017-07-28 ENCOUNTER — Other Ambulatory Visit: Payer: Self-pay | Admitting: Family Medicine

## 2017-07-28 DIAGNOSIS — I10 Essential (primary) hypertension: Secondary | ICD-10-CM

## 2017-07-28 DIAGNOSIS — Z1231 Encounter for screening mammogram for malignant neoplasm of breast: Secondary | ICD-10-CM

## 2017-07-28 DIAGNOSIS — E785 Hyperlipidemia, unspecified: Secondary | ICD-10-CM

## 2017-07-29 ENCOUNTER — Ambulatory Visit (HOSPITAL_COMMUNITY): Payer: Medicare Other

## 2017-07-30 ENCOUNTER — Other Ambulatory Visit: Payer: Self-pay | Admitting: Family Medicine

## 2017-08-05 ENCOUNTER — Ambulatory Visit (HOSPITAL_COMMUNITY): Payer: Medicare Other

## 2017-08-11 ENCOUNTER — Ambulatory Visit (HOSPITAL_COMMUNITY): Payer: Medicare Other

## 2017-08-16 DIAGNOSIS — M2550 Pain in unspecified joint: Secondary | ICD-10-CM | POA: Diagnosis not present

## 2017-08-16 DIAGNOSIS — M25579 Pain in unspecified ankle and joints of unspecified foot: Secondary | ICD-10-CM | POA: Diagnosis not present

## 2017-08-16 DIAGNOSIS — D638 Anemia in other chronic diseases classified elsewhere: Secondary | ICD-10-CM | POA: Diagnosis not present

## 2017-08-16 DIAGNOSIS — M791 Myalgia, unspecified site: Secondary | ICD-10-CM | POA: Diagnosis not present

## 2017-08-16 DIAGNOSIS — M7581 Other shoulder lesions, right shoulder: Secondary | ICD-10-CM | POA: Diagnosis not present

## 2017-08-16 DIAGNOSIS — G8929 Other chronic pain: Secondary | ICD-10-CM | POA: Diagnosis not present

## 2017-08-16 DIAGNOSIS — R748 Abnormal levels of other serum enzymes: Secondary | ICD-10-CM | POA: Diagnosis not present

## 2017-08-16 DIAGNOSIS — M25511 Pain in right shoulder: Secondary | ICD-10-CM | POA: Diagnosis not present

## 2017-08-16 DIAGNOSIS — Z79899 Other long term (current) drug therapy: Secondary | ICD-10-CM | POA: Diagnosis not present

## 2017-08-16 DIAGNOSIS — M0579 Rheumatoid arthritis with rheumatoid factor of multiple sites without organ or systems involvement: Secondary | ICD-10-CM | POA: Diagnosis not present

## 2017-08-19 ENCOUNTER — Ambulatory Visit (HOSPITAL_COMMUNITY): Payer: Medicare Other

## 2017-08-22 ENCOUNTER — Other Ambulatory Visit: Payer: Self-pay | Admitting: Family Medicine

## 2017-08-22 DIAGNOSIS — Z1231 Encounter for screening mammogram for malignant neoplasm of breast: Secondary | ICD-10-CM

## 2017-08-26 ENCOUNTER — Other Ambulatory Visit: Payer: Self-pay | Admitting: Family Medicine

## 2017-08-26 DIAGNOSIS — I10 Essential (primary) hypertension: Secondary | ICD-10-CM

## 2017-08-26 DIAGNOSIS — E785 Hyperlipidemia, unspecified: Secondary | ICD-10-CM

## 2017-08-26 DIAGNOSIS — Z1231 Encounter for screening mammogram for malignant neoplasm of breast: Secondary | ICD-10-CM

## 2017-09-02 ENCOUNTER — Ambulatory Visit (HOSPITAL_COMMUNITY)
Admission: RE | Admit: 2017-09-02 | Discharge: 2017-09-02 | Disposition: A | Discharge status: Home / Self Care | Payer: Medicare Other | Source: Ambulatory Visit | Attending: Family Medicine | Admitting: Family Medicine

## 2017-09-02 ENCOUNTER — Encounter (HOSPITAL_COMMUNITY): Payer: Self-pay

## 2017-09-02 DIAGNOSIS — Z1231 Encounter for screening mammogram for malignant neoplasm of breast: Secondary | ICD-10-CM | POA: Insufficient documentation

## 2017-09-13 ENCOUNTER — Telehealth: Payer: Self-pay | Admitting: Family Medicine

## 2017-09-13 NOTE — Telephone Encounter (Signed)
CB # [id number]  PT needs a letter to excuse from [employer]

## 2017-09-15 NOTE — Telephone Encounter (Signed)
Note provided

## 2017-09-22 ENCOUNTER — Inpatient Hospital Stay (HOSPITAL_COMMUNITY)
Admission: EM | Admit: 2017-09-22 | Discharge: 2017-09-26 | DRG: 481 | Disposition: A | Discharge status: Home / Self Care | Payer: Medicare Other | Attending: Internal Medicine | Admitting: Internal Medicine

## 2017-09-22 ENCOUNTER — Telehealth: Payer: Self-pay | Admitting: Family Medicine

## 2017-09-22 ENCOUNTER — Other Ambulatory Visit: Payer: Self-pay

## 2017-09-22 ENCOUNTER — Emergency Department (HOSPITAL_COMMUNITY): Payer: Medicare Other

## 2017-09-22 ENCOUNTER — Encounter (HOSPITAL_COMMUNITY): Payer: Self-pay | Admitting: Emergency Medicine

## 2017-09-22 DIAGNOSIS — Y92009 Unspecified place in unspecified non-institutional (private) residence as the place of occurrence of the external cause: Secondary | ICD-10-CM

## 2017-09-22 DIAGNOSIS — I69959 Hemiplegia and hemiparesis following unspecified cerebrovascular disease affecting unspecified side: Secondary | ICD-10-CM

## 2017-09-22 DIAGNOSIS — I4892 Unspecified atrial flutter: Secondary | ICD-10-CM | POA: Diagnosis not present

## 2017-09-22 DIAGNOSIS — I69320 Aphasia following cerebral infarction: Secondary | ICD-10-CM

## 2017-09-22 DIAGNOSIS — E860 Dehydration: Secondary | ICD-10-CM | POA: Diagnosis present

## 2017-09-22 DIAGNOSIS — I440 Atrioventricular block, first degree: Secondary | ICD-10-CM | POA: Diagnosis not present

## 2017-09-22 DIAGNOSIS — E875 Hyperkalemia: Secondary | ICD-10-CM | POA: Diagnosis not present

## 2017-09-22 DIAGNOSIS — Z888 Allergy status to other drugs, medicaments and biological substances status: Secondary | ICD-10-CM | POA: Diagnosis not present

## 2017-09-22 DIAGNOSIS — W010XXA Fall on same level from slipping, tripping and stumbling without subsequent striking against object, initial encounter: Secondary | ICD-10-CM | POA: Diagnosis present

## 2017-09-22 DIAGNOSIS — I69351 Hemiplegia and hemiparesis following cerebral infarction affecting right dominant side: Secondary | ICD-10-CM | POA: Diagnosis not present

## 2017-09-22 DIAGNOSIS — M069 Rheumatoid arthritis, unspecified: Secondary | ICD-10-CM | POA: Diagnosis present

## 2017-09-22 DIAGNOSIS — Z7982 Long term (current) use of aspirin: Secondary | ICD-10-CM

## 2017-09-22 DIAGNOSIS — I69051 Hemiplegia and hemiparesis following nontraumatic subarachnoid hemorrhage affecting right dominant side: Secondary | ICD-10-CM | POA: Diagnosis not present

## 2017-09-22 DIAGNOSIS — E785 Hyperlipidemia, unspecified: Secondary | ICD-10-CM | POA: Diagnosis not present

## 2017-09-22 DIAGNOSIS — N179 Acute kidney failure, unspecified: Secondary | ICD-10-CM

## 2017-09-22 DIAGNOSIS — F17210 Nicotine dependence, cigarettes, uncomplicated: Secondary | ICD-10-CM | POA: Diagnosis not present

## 2017-09-22 DIAGNOSIS — R51 Headache: Secondary | ICD-10-CM | POA: Diagnosis not present

## 2017-09-22 DIAGNOSIS — I10 Essential (primary) hypertension: Secondary | ICD-10-CM | POA: Diagnosis present

## 2017-09-22 DIAGNOSIS — S72001A Fracture of unspecified part of neck of right femur, initial encounter for closed fracture: Secondary | ICD-10-CM | POA: Diagnosis not present

## 2017-09-22 DIAGNOSIS — R402441 Other coma, without documented Glasgow coma scale score, or with partial score reported, in the field [EMT or ambulance]: Secondary | ICD-10-CM | POA: Diagnosis not present

## 2017-09-22 DIAGNOSIS — Z79899 Other long term (current) drug therapy: Secondary | ICD-10-CM

## 2017-09-22 DIAGNOSIS — R531 Weakness: Secondary | ICD-10-CM | POA: Diagnosis not present

## 2017-09-22 DIAGNOSIS — S72141A Displaced intertrochanteric fracture of right femur, initial encounter for closed fracture: Principal | ICD-10-CM | POA: Diagnosis present

## 2017-09-22 DIAGNOSIS — D62 Acute posthemorrhagic anemia: Secondary | ICD-10-CM | POA: Diagnosis not present

## 2017-09-22 DIAGNOSIS — Z419 Encounter for procedure for purposes other than remedying health state, unspecified: Secondary | ICD-10-CM

## 2017-09-22 DIAGNOSIS — I2510 Atherosclerotic heart disease of native coronary artery without angina pectoris: Secondary | ICD-10-CM | POA: Diagnosis not present

## 2017-09-22 DIAGNOSIS — R609 Edema, unspecified: Secondary | ICD-10-CM | POA: Diagnosis not present

## 2017-09-22 DIAGNOSIS — F909 Attention-deficit hyperactivity disorder, unspecified type: Secondary | ICD-10-CM | POA: Diagnosis not present

## 2017-09-22 DIAGNOSIS — R52 Pain, unspecified: Secondary | ICD-10-CM | POA: Diagnosis not present

## 2017-09-22 DIAGNOSIS — D649 Anemia, unspecified: Secondary | ICD-10-CM | POA: Diagnosis not present

## 2017-09-22 DIAGNOSIS — Z955 Presence of coronary angioplasty implant and graft: Secondary | ICD-10-CM | POA: Diagnosis not present

## 2017-09-22 DIAGNOSIS — S72009A Fracture of unspecified part of neck of unspecified femur, initial encounter for closed fracture: Secondary | ICD-10-CM

## 2017-09-22 HISTORY — DX: Acute kidney failure, unspecified: N17.9

## 2017-09-22 LAB — CBC WITH DIFFERENTIAL/PLATELET
Basophils Absolute: 0 10*3/uL (ref 0.0–0.1)
Basophils Relative: 0 %
Eosinophils Absolute: 0 10*3/uL (ref 0.0–0.7)
Eosinophils Relative: 0 %
HCT: 30.5 % — ABNORMAL LOW (ref 36.0–46.0)
Hemoglobin: 9.9 g/dL — ABNORMAL LOW (ref 12.0–15.0)
Lymphocytes Relative: 9 %
Lymphs Abs: 0.9 10*3/uL (ref 0.7–4.0)
MCH: 32.4 pg (ref 26.0–34.0)
MCHC: 32.5 g/dL (ref 30.0–36.0)
MCV: 99.7 fL (ref 78.0–100.0)
Monocytes Absolute: 0.9 10*3/uL (ref 0.1–1.0)
Monocytes Relative: 9 %
Neutro Abs: 8.7 10*3/uL — ABNORMAL HIGH (ref 1.7–7.7)
Neutrophils Relative %: 82 %
Platelets: 186 10*3/uL (ref 150–400)
RBC: 3.06 MIL/uL — ABNORMAL LOW (ref 3.87–5.11)
RDW: 16.5 % — ABNORMAL HIGH (ref 11.5–15.5)
WBC: 10.5 10*3/uL (ref 4.0–10.5)

## 2017-09-22 LAB — COMPREHENSIVE METABOLIC PANEL
ALT: 32 U/L (ref 14–54)
AST: 33 U/L (ref 15–41)
Albumin: 3.2 g/dL — ABNORMAL LOW (ref 3.5–5.0)
Alkaline Phosphatase: 74 U/L (ref 38–126)
Anion gap: 9 (ref 5–15)
BUN: 10 mg/dL (ref 6–20)
CO2: 25 mmol/L (ref 22–32)
Calcium: 8.8 mg/dL — ABNORMAL LOW (ref 8.9–10.3)
Chloride: 103 mmol/L (ref 101–111)
Creatinine, Ser: 1.15 mg/dL — ABNORMAL HIGH (ref 0.44–1.00)
GFR calc Af Amer: 58 mL/min — ABNORMAL LOW (ref >60)
GFR calc non Af Amer: 50 mL/min — ABNORMAL LOW (ref >60)
Glucose, Bld: 101 mg/dL — ABNORMAL HIGH (ref 65–99)
Potassium: 3.8 mmol/L (ref 3.5–5.1)
Sodium: 137 mmol/L (ref 135–145)
Total Bilirubin: 0.6 mg/dL (ref 0.3–1.2)
Total Protein: 6.7 g/dL (ref 6.5–8.1)

## 2017-09-22 MED ORDER — ONDANSETRON HCL 4 MG/2ML IJ SOLN: 4.0000 mg | Freq: Four times a day (QID) | INTRAMUSCULAR | Status: DC | PRN

## 2017-09-22 MED ORDER — ACETAMINOPHEN 325 MG PO TABS
650.0000 mg | ORAL_TABLET | Freq: Four times a day (QID) | ORAL | Status: DC | PRN
  Administered 2017-09-26: 650 mg via ORAL
  Filled 2017-09-22: qty 2

## 2017-09-22 MED ORDER — LACTATED RINGERS IV SOLN
INTRAVENOUS | Status: AC
  Administered 2017-09-22: 16:00:00 via INTRAVENOUS

## 2017-09-22 MED ORDER — OXYBUTYNIN CHLORIDE 5 MG PO TABS
2.5000 mg | ORAL_TABLET | Freq: Two times a day (BID) | ORAL | Status: DC
  Administered 2017-09-22 – 2017-09-26 (×8): 2.5 mg via ORAL
  Filled 2017-09-22 (×2): qty 1
  Filled 2017-09-22: qty 0.5
  Filled 2017-09-22 (×2): qty 1
  Filled 2017-09-22: qty 0.5
  Filled 2017-09-22 (×4): qty 1

## 2017-09-22 MED ORDER — ADULT MULTIVITAMIN W/MINERALS CH
1.0000 | ORAL_TABLET | Freq: Every day | ORAL | Status: DC
  Administered 2017-09-24 – 2017-09-26 (×3): 1 via ORAL
  Filled 2017-09-22 (×4): qty 1

## 2017-09-22 MED ORDER — ASPIRIN EC 81 MG PO TBEC
81.0000 mg | DELAYED_RELEASE_TABLET | Freq: Every day | ORAL | Status: DC
  Administered 2017-09-24 – 2017-09-26 (×3): 81 mg via ORAL
  Filled 2017-09-22 (×4): qty 1

## 2017-09-22 MED ORDER — BACLOFEN 10 MG PO TABS
10.0000 mg | ORAL_TABLET | Freq: Two times a day (BID) | ORAL | Status: DC
  Administered 2017-09-22 – 2017-09-26 (×7): 10 mg via ORAL
  Filled 2017-09-22 (×8): qty 1

## 2017-09-22 MED ORDER — METOPROLOL SUCCINATE ER 25 MG PO TB24
25.0000 mg | ORAL_TABLET | Freq: Every day | ORAL | Status: DC
  Administered 2017-09-23 – 2017-09-26 (×4): 25 mg via ORAL
  Filled 2017-09-22 (×5): qty 1

## 2017-09-22 MED ORDER — ACETAMINOPHEN 650 MG RE SUPP: 650.0000 mg | Freq: Four times a day (QID) | RECTAL | Status: DC | PRN

## 2017-09-22 MED ORDER — ONDANSETRON HCL 4 MG PO TABS: 4.0000 mg | ORAL_TABLET | Freq: Four times a day (QID) | ORAL | Status: DC | PRN

## 2017-09-22 MED ORDER — HYDRALAZINE HCL 20 MG/ML IJ SOLN
10.0000 mg | INTRAMUSCULAR | Status: DC | PRN
  Administered 2017-09-26: 10 mg via INTRAVENOUS
  Filled 2017-09-22: qty 1

## 2017-09-22 MED ORDER — MONTELUKAST SODIUM 10 MG PO TABS
10.0000 mg | ORAL_TABLET | Freq: Every day | ORAL | Status: DC
  Administered 2017-09-22 – 2017-09-25 (×4): 10 mg via ORAL
  Filled 2017-09-22 (×4): qty 1

## 2017-09-22 MED ORDER — HYDROCODONE-ACETAMINOPHEN 5-325 MG PO TABS
1.0000 | ORAL_TABLET | ORAL | Status: DC | PRN
  Administered 2017-09-22 – 2017-09-26 (×6): 1 via ORAL
  Filled 2017-09-22 (×7): qty 1

## 2017-09-22 MED ORDER — HYDROMORPHONE HCL 1 MG/ML IJ SOLN
0.5000 mg | INTRAMUSCULAR | Status: DC | PRN
  Administered 2017-09-22: 0.5 mg via INTRAVENOUS
  Filled 2017-09-22: qty 1

## 2017-09-22 MED ORDER — HEPARIN SODIUM (PORCINE) 5000 UNIT/ML IJ SOLN
5000.0000 [IU] | Freq: Three times a day (TID) | INTRAMUSCULAR | Status: DC
  Administered 2017-09-22 – 2017-09-23 (×2): 5000 [IU] via SUBCUTANEOUS
  Filled 2017-09-22 (×2): qty 1

## 2017-09-22 MED ORDER — LEFLUNOMIDE 20 MG PO TABS
20.0000 mg | ORAL_TABLET | Freq: Every day | ORAL | Status: DC
  Administered 2017-09-24 – 2017-09-26 (×3): 20 mg via ORAL
  Filled 2017-09-22 (×4): qty 1

## 2017-09-22 MED ORDER — HYDROMORPHONE HCL 2 MG/ML IJ SOLN
0.5000 mg | INTRAMUSCULAR | Status: DC | PRN
  Administered 2017-09-24 – 2017-09-25 (×6): 0.5 mg via INTRAVENOUS
  Filled 2017-09-22 (×9): qty 1

## 2017-09-22 MED ORDER — FOLIC ACID 1 MG PO TABS
1.0000 mg | ORAL_TABLET | Freq: Every day | ORAL | Status: DC
  Administered 2017-09-24 – 2017-09-26 (×3): 1 mg via ORAL
  Filled 2017-09-22 (×4): qty 1

## 2017-09-22 MED ORDER — ATORVASTATIN CALCIUM 80 MG PO TABS
80.0000 mg | ORAL_TABLET | Freq: Every day | ORAL | Status: DC
  Administered 2017-09-23 – 2017-09-25 (×3): 80 mg via ORAL
  Filled 2017-09-22 (×3): qty 1

## 2017-09-22 MED ORDER — FLUTICASONE PROPIONATE 50 MCG/ACT NA SUSP
1.0000 | Freq: Every day | NASAL | Status: DC
  Administered 2017-09-24 – 2017-09-26 (×3): 1 via NASAL
  Filled 2017-09-22 (×2): qty 16

## 2017-09-22 NOTE — H&P (Signed)
History and Physical    Danielle Cruz [id number] DOB: May 13, 1955 DOA: 09/22/2017  PCP: Fayrene Helper, MD   Patient coming from: Home  Chief Complaint: Fall with hip pain  HPI: Danielle Cruz is a 62 y.o. female with medical history significant for hypertension, dyslipidemia, rheumatoid arthritis, tobacco abuse, CAD with prior stents, and prior CVA with right-sided hemiplegia who ambulates with a quad cane and uses a wheelchair at home.  She had a fall earlier today as she was walking out of her bathroom.  According to the family member at the bedside her leg just got caught in the doorway and she fell onto her right hip and developed excruciating pain.  She states that the pain is worse with movement but is otherwise rated 0 out of 10 at rest.  She denies any radiation the pain.  The patient can understand everything that is being asked and nods to questions, but is otherwise nonverbal.   ED Course: Vital signs are stable and EKG demonstrates motion artifact, but she is currently in normal sinus rhythm.  Right hip x-ray with right intertrochanteric femoral neck fracture and varus angulation noted.  Laboratory data with some mild creatinine elevation of 1.15 with baseline of 0.81 noted previously.  She is also noted to have stable anemia.  No overt bleeding identified.  ED physician has spoken with Dr. Doreatha Martin of orthopedics who recommends transfer to [facility] for fixation.  Review of Systems: Cannot be fully obtained on account of patient's condition.  Past Medical History:  Diagnosis Date  . AKI (acute kidney injury) ([place]) 09/22/2017  . Anemia   . 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 Arthritis   . Colonic polyp 2010   Hemorrhoids; h/o mild hematochezia  . CVA (cerebral infarction) 08/2008   sizable left -residual expressive aphasia, right sided weakness; ambulates with difficulty with the right leg brace   .  Hyperlipidemia   . Hypertension 2005  . Rheumatoid arthritis(714.0)   . Stroke ([place])   . Tobacco abuse, in remission    Quit in 2010; 30-pack-year history    Past Surgical History:  Procedure Laterality Date  . ANKLE SURGERY     Right  . CAROTID STENT INSERTION    . COLONOSCOPY W/ POLYPECTOMY  11/2008   Dr. Gala Romney. Left-sided diverticula, Pedunculated polyp snared (no adenomatous changes)  . OOPHORECTOMY       reports that she has been smoking cigarettes.  She has been smoking about 0.50 packs per day. She has never used smokeless tobacco. She reports that she does not drink alcohol or use drugs.  Allergies  Allergen Reactions  . Ace Inhibitors Cough    New daily cough since starting ACE    Family History  Problem Relation Age of Onset  . Cancer Father        prostate, deceased 46s  . Breast cancer Sister        Deceased 27s  . Heart attack Mother        deceased 64, during childbirth  . Cancer Brother        lymphoma  . Colon cancer Maternal Aunt        older than age 38  . Liver disease Neg Hx     Prior to Admission medications   Medication Sig Start Date End Date Taking? Authorizing Provider  aspirin EC 81 MG tablet Take 81 mg by  mouth daily.   Yes [provider]  atorvastatin (LIPITOR) 80 MG tablet TAKE 1 TABLET(80 MG) BY MOUTH DAILY 08/01/17  Yes Fayrene Helper, MD  baclofen (LIORESAL) 10 MG tablet Take 10 mg by mouth 2 (two) times daily.   Yes [provider]  fluticasone (FLONASE) 50 MCG/ACT nasal spray Place 1 spray into both nostrils daily. 07/14/17  Yes Fayrene Helper, MD  folic acid (FOLVITE) 1 MG tablet TK 1 T PO QD 04/14/17  Yes [provider]  furosemide (LASIX) 40 MG tablet TAKE 1 TABLET BY MOUTH DAILY Patient taking differently: TAKE 1 TABLET DAILY AS NEEDED FOR SWELLING 02/05/16  Yes Branch, Alphonse Guild, MD  hydrochlorothiazide (HYDRODIURIL) 25 MG tablet Take 25 mg by mouth daily.   Yes [provider]    HYDROcodone-acetaminophen (NORCO/VICODIN) 5-325 MG tablet TK 1 T PO ONCE A DAY PRN 06/16/17  Yes [provider]  leflunomide (ARAVA) 20 MG tablet Take 20 mg by mouth daily.     Yes [provider]  metoprolol succinate (TOPROL-XL) 25 MG 24 hr tablet Take 1 tablet (25 mg total) by mouth daily. 06/27/17  Yes Arnoldo Lenis, MD  montelukast (SINGULAIR) 10 MG tablet TAKE 1 TABLET(10 MG) BY MOUTH AT BEDTIME 08/29/17  Yes Fayrene Helper, MD  Multiple Vitamin (MULTIVITAMIN WITH MINERALS) TABS tablet Take 1 tablet by mouth daily.   Yes [provider]  oxybutynin (DITROPAN) 5 MG tablet TAKE 1/2 TABLET BY MOUTH TWICE DAILY 08/01/17  Yes Fayrene Helper, MD  potassium chloride SA (K-DUR,KLOR-CON) 20 MEQ tablet TAKE 1 TABLET BY MOUTH EVERY DAY 07/11/17  Yes Arnoldo Lenis, MD  spironolactone (ALDACTONE) 25 MG tablet TAKE 1 TABLET(25 MG) BY MOUTH DAILY 07/08/17  Yes Branch, Alphonse Guild, MD  atorvastatin (LIPITOR) 40 MG tablet Take 1 tablet (40 mg total) by mouth daily. 06/30/17 09/28/17  Arnoldo Lenis, MD    Physical Exam: Vitals:   09/22/17 1220 09/22/17 1221 09/22/17 1400  BP: (!) 174/71  (!) 110/44  Pulse: 83  82  Resp: 18  (!) 22  Temp: 98.1 F (36.7 C)    TempSrc: Oral    SpO2: 100%  98%  Weight:  67.1 kg (148 lb)   Height:  5\' 8"  (1.727 m)     Constitutional: NAD, calm, comfortable Vitals:   09/22/17 1220 09/22/17 1221 09/22/17 1400  BP: (!) 174/71  (!) 110/44  Pulse: 83  82  Resp: 18  (!) 22  Temp: 98.1 F (36.7 C)    TempSrc: Oral    SpO2: 100%  98%  Weight:  67.1 kg (148 lb)   Height:  5\' 8"  (1.727 m)    Eyes: lids and conjunctivae normal ENMT: Mucous membranes are moist.  Neck: normal, supple Respiratory: clear to auscultation bilaterally. Normal respiratory effort. No accessory muscle use.  Cardiovascular: Regular rate and rhythm, no murmurs.  Mild bilateral nonpitting extremity edema. Abdomen: no tenderness, no distention. Bowel  sounds positive.  Musculoskeletal: Right hip tender to palpation and painful range of motion. Skin: no rashes, lesions, ulcers.   Labs on Admission: I have personally reviewed following labs and imaging studies  CBC: Recent Labs  Lab 09/22/17 1412  WBC 10.5  NEUTROABS 8.7*  HGB 9.9*  HCT 30.5*  MCV 99.7  PLT 536   Basic Metabolic Panel: Recent Labs  Lab 09/22/17 1412  NA 137  K 3.8  CL 103  CO2 25  GLUCOSE 101*  BUN 10  CREATININE 1.15*  CALCIUM 8.8*   GFR: Estimated Creatinine Clearance: 51.2 mL/min (A) (by C-G formula based on SCr of 1.15 mg/dL (H)). Liver Function Tests: Recent Labs  Lab 09/22/17 1412  AST 33  ALT 32  ALKPHOS 74  BILITOT 0.6  PROT 6.7  ALBUMIN 3.2*   No results for input(s): LIPASE, AMYLASE in the last 168 hours. No results for input(s): AMMONIA in the last 168 hours. Coagulation Profile: No results for input(s): INR, PROTIME in the last 168 hours. Cardiac Enzymes: No results for input(s): CKTOTAL, CKMB, CKMBINDEX, TROPONINI in the last 168 hours. BNP (last 3 results) No results for input(s): PROBNP in the last 8760 hours. HbA1C: No results for input(s): HGBA1C in the last 72 hours. CBG: No results for input(s): GLUCAP in the last 168 hours. Lipid Profile: No results for input(s): CHOL, HDL, LDLCALC, TRIG, CHOLHDL, LDLDIRECT in the last 72 hours. Thyroid Function Tests: No results for input(s): TSH, T4TOTAL, FREET4, T3FREE, THYROIDAB in the last 72 hours. Anemia Panel: No results for input(s): VITAMINB12, FOLATE, FERRITIN, TIBC, IRON, RETICCTPCT in the last 72 hours. Urine analysis:    Component Value Date/Time   COLORURINE YELLOW 04/28/2009 0825   APPEARANCEUR CLEAR 04/28/2009 0825   LABSPEC 1.020 04/28/2009 0825   PHURINE 6.5 04/28/2009 0825   GLUCOSEU NEGATIVE 04/28/2009 0825   HGBUR NEGATIVE 04/28/2009 0825   BILIRUBINUR NEGATIVE 04/28/2009 0825   KETONESUR NEGATIVE 04/28/2009 0825   PROTEINUR NEGATIVE 04/28/2009 0825    UROBILINOGEN 0.2 04/28/2009 0825   NITRITE NEGATIVE 04/28/2009 0825   LEUKOCYTESUR  04/28/2009 0825    NEGATIVE MICROSCOPIC NOT DONE ON URINES WITH NEGATIVE PROTEIN, BLOOD, LEUKOCYTES, NITRITE, OR GLUCOSE <1000 mg/dL.    Radiological Exams on Admission: Dg Hip Unilat  With Pelvis 2-3 Views Right  Result Date: 09/22/2017 CLINICAL DATA:  Fall.  Hip pain. EXAM: DG HIP (WITH OR WITHOUT PELVIS) 2-3V RIGHT COMPARISON:  None. FINDINGS: Bones are diffusely demineralized. Intertrochanteric right femoral neck fracture evident with varus angulation. Degenerative changes are seen at the symphysis pubis. IMPRESSION: Right intertrochanteric femoral neck fracture with varus angulation. Electronically Signed   By: Misty Stanley M.D.   On: 09/22/2017 12:50    EKG: Independently reviewed. Extensive motion artifact in some leads that appear to be atrial flutter, but in NSR at 96bpm; confirmed on tele.  Assessment/Plan Principal Problem:   Hip fracture (HCC) Active Problems:   Hyperlipemia   Essential hypertension   Hemiplegia, late effect of cerebrovascular disease (HCC)   Rheumatoid arthritis ([place])   CAD in native artery   AKI (acute kidney injury) ([place])    1. Right intertrochanteric femoral neck fracture status post mechanical fall.  Transfer to [facility] for further evaluation per orthopedics.  N.p.o. after midnight.  Dilaudid as needed for pain management. 2. AKI.  Likely due to mild dehydration.  Maintain on some gentle IV fluid and avoid nephrotoxic agents to include her home diuretics. 3. Prior CVA with right-sided hemiparesis.  Continue aspirin and statin. 4. Hypertension.  Continue home Toprol-XL and avoid diuretics as noted above.  Hydralazine as needed. 5. Dyslipidemia.  Continue atorvastatin. 6. CAD.  Continue statin and aspirin as well as metoprolol. 7. Rheumatoid arthritis.  Continue leflunomide.   DVT prophylaxis: Heparin Code Status: Full Family Communication: Sister and  granddaughter at bedside Disposition Plan:To [facility] for Ortho evaluation Consults called:Ortho Dr. Lorenso Quarry by EDP Admission status: Inpatient, [place] [facility] Pager [phone]  If 7PM-7AM, please contact night-coverage [email or web address]  Password [email or web address]  09/22/2017, 2:56 PM

## 2017-09-22 NOTE — ED Notes (Signed)
Pt waiting on bed placement at [facility]. Pt alert and no pain at present time. Pt family at bedside.

## 2017-09-22 NOTE — ED Notes (Signed)
Bladder scan volume approx 200 CC. Pt refuses urinary cath. purewick remains in place. Family at bedside.

## 2017-09-22 NOTE — Telephone Encounter (Signed)
Danielle Cruz  is calling in as Pt fell this AM, and she is only complaining of pain when she stands, with her Right hip and knee, Danielle Cruz is calling in to see what he needs to do.   Transferred to [facility]

## 2017-09-22 NOTE — ED Provider Notes (Signed)
[facility] EMERGENCY DEPARTMENT Provider Note   CSN: [id number] Arrival date & time: 09/22/17  1212     History   Chief Complaint Chief Complaint  Patient presents with  . Hip Pain    HPI Danielle Cruz is a 62 y.o. female.  Patient has a history of stroke and walks with a cane for weakness on her right side.  She fell today and hurt her right hip.  No loss consciousness  The history is provided by the patient and a relative. No language interpreter was used.  Hip Pain  This is a new problem. The current episode started 1 to 2 hours ago. The problem occurs constantly. The problem has not changed since onset.Pertinent negatives include no chest pain, no abdominal pain and no headaches. Exacerbated by: Movement. Nothing relieves the symptoms.    Past Medical History:  Diagnosis Date  . Anemia   . 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 Arthritis   . Colonic polyp 2010   Hemorrhoids; h/o mild hematochezia  . CVA (cerebral infarction) 08/2008   sizable left -residual expressive aphasia, right sided weakness; ambulates with difficulty with the right leg brace   . Hyperlipidemia   . Hypertension 2005  . Rheumatoid arthritis(714.0)   . Stroke ([place])   . Tobacco abuse, in remission    Quit in 2010; 30-pack-year history    Patient Active Problem List   Diagnosis Date Noted  . Hip fracture ([place]) 09/22/2017  . At high risk for falls 01/31/2017  . Disorder of bone and cartilage 05/11/2016  . Normocytic anemia 01/08/2015  . Elevated ferritin 01/08/2015  . LUQ fullness 01/08/2015  . CAD in native artery 12/07/2014  . Bowel habit changes 12/07/2014  . Cough 12/07/2014  . Fasting hyperglycemia 11/05/2011  . Elevated transaminase level 01/08/2011  . Tobacco abuse   . Rheumatoid arthritis ([place])   . Colonic polyp   . Hemiplegia, late effect of cerebrovascular disease ([place]) 03/16/2010  . Cardiovascular disease  01/14/2010  . Anemia 04/01/2009  . Hyperlipemia 11/14/2008  . Essential hypertension 11/14/2008    Past Surgical History:  Procedure Laterality Date  . ANKLE SURGERY     Right  . CAROTID STENT INSERTION    . COLONOSCOPY W/ POLYPECTOMY  11/2008   Dr. Gala Romney. Left-sided diverticula, Pedunculated polyp snared (no adenomatous changes)  . OOPHORECTOMY       OB History   None      Home Medications    Prior to Admission medications   Medication Sig Start Date End Date Taking? Authorizing Provider  aspirin EC 81 MG tablet Take 81 mg by mouth daily.   Yes [provider]  atorvastatin (LIPITOR) 80 MG tablet TAKE 1 TABLET(80 MG) BY MOUTH DAILY 08/01/17  Yes Fayrene Helper, MD  baclofen (LIORESAL) 10 MG tablet Take 10 mg by mouth 2 (two) times daily.   Yes [provider]  fluticasone (FLONASE) 50 MCG/ACT nasal spray Place 1 spray into both nostrils daily. 07/14/17  Yes Fayrene Helper, MD  folic acid (FOLVITE) 1 MG tablet TK 1 T PO QD 04/14/17  Yes [provider]  furosemide (LASIX) 40 MG tablet TAKE 1 TABLET BY MOUTH DAILY Patient taking differently: TAKE 1 TABLET DAILY AS NEEDED FOR SWELLING 02/05/16  Yes Branch, Alphonse Guild, MD  hydrochlorothiazide (HYDRODIURIL) 25 MG tablet Take 25 mg by mouth daily.   Yes [provider]  HYDROcodone-acetaminophen (NORCO/VICODIN) 5-325 MG tablet TK 1 T PO ONCE A DAY PRN 06/16/17  Yes [provider]  leflunomide (ARAVA) 20 MG tablet Take 20 mg by mouth daily.     Yes [provider]  metoprolol succinate (TOPROL-XL) 25 MG 24 hr tablet Take 1 tablet (25 mg total) by mouth daily. 06/27/17  Yes Arnoldo Lenis, MD  montelukast (SINGULAIR) 10 MG tablet TAKE 1 TABLET(10 MG) BY MOUTH AT BEDTIME 08/29/17  Yes Fayrene Helper, MD  Multiple Vitamin (MULTIVITAMIN WITH MINERALS) TABS tablet Take 1 tablet by mouth daily.   Yes [provider]  oxybutynin (DITROPAN) 5 MG tablet TAKE 1/2  TABLET BY MOUTH TWICE DAILY 08/01/17  Yes Fayrene Helper, MD  potassium chloride SA (K-DUR,KLOR-CON) 20 MEQ tablet TAKE 1 TABLET BY MOUTH EVERY DAY 07/11/17  Yes Arnoldo Lenis, MD  spironolactone (ALDACTONE) 25 MG tablet TAKE 1 TABLET(25 MG) BY MOUTH DAILY 07/08/17  Yes Branch, Alphonse Guild, MD  atorvastatin (LIPITOR) 40 MG tablet Take 1 tablet (40 mg total) by mouth daily. 06/30/17 09/28/17  Arnoldo Lenis, MD    Family History Family History  Problem Relation Age of Onset  . Cancer Father        prostate, deceased 31s  . Breast cancer Sister        Deceased 64s  . Heart attack Mother        deceased 12, during childbirth  . Cancer Brother        lymphoma  . Colon cancer Maternal Aunt        older than age 90  . Liver disease Neg Hx     Social History Social History   Tobacco Use  . Smoking status: Current Every Day Smoker    Packs/day: 0.50    Types: Cigarettes  . Smokeless tobacco: Never Used  . Tobacco comment: current 15  Substance Use Topics  . Alcohol use: No    Alcohol/week: 0.0 oz    Comment: quit 6 years ago   . Drug use: No     Allergies   Ace inhibitors   Review of Systems Review of Systems  Constitutional: Negative for appetite change and fatigue.  HENT: Negative for congestion, ear discharge and sinus pressure.   Eyes: Negative for discharge.  Respiratory: Negative for cough.   Cardiovascular: Negative for chest pain.  Gastrointestinal: Negative for abdominal pain and diarrhea.  Genitourinary: Negative for frequency and hematuria.  Musculoskeletal: Negative for back pain.       Right hip pain  Skin: Negative for rash.  Neurological: Negative for seizures and headaches.  Psychiatric/Behavioral: Negative for hallucinations.     Physical Exam Updated Vital Signs BP (!) 174/71 (BP Location: Left Arm)   Pulse 83   Temp 98.1 F (36.7 C) (Oral)   Resp 18   Ht 5\' 8"  (1.727 m)   Wt 67.1 kg (148 lb)   SpO2 100%   BMI 22.50 kg/m    Physical Exam  Constitutional: She is oriented to person, place, and time. She appears well-developed.  HENT:  Head: Normocephalic.  Eyes: Conjunctivae and EOM are normal. No scleral icterus.  Neck: Neck supple. No thyromegaly present.  Cardiovascular: Normal rate and regular rhythm. Exam reveals no gallop and no friction rub.  No murmur heard. Pulmonary/Chest: No stridor. She has no wheezes. She has no rales. She exhibits no tenderness.  Abdominal: She exhibits no distension. There is no tenderness. There is no rebound.  Musculoskeletal:  She exhibits no edema.  Tender right hip with external rotation  Lymphadenopathy:    She has no cervical adenopathy.  Neurological: She is oriented to person, place, and time. She exhibits normal muscle tone. Coordination normal.  Skin: No rash noted. No erythema.  Psychiatric: She has a normal mood and affect. Her behavior is normal.     ED Treatments / Results  Labs (all labs ordered are listed, but only abnormal results are displayed) Labs Reviewed  CBC WITH DIFFERENTIAL/PLATELET - Abnormal; Notable for the following components:      Result Value   RBC 3.06 (*)    Hemoglobin 9.9 (*)    HCT 30.5 (*)    RDW 16.5 (*)    Neutro Abs 8.7 (*)    All other components within normal limits  COMPREHENSIVE METABOLIC PANEL    EKG None  Radiology Dg Hip Unilat  With Pelvis 2-3 Views Right  Result Date: 09/22/2017 CLINICAL DATA:  Fall.  Hip pain. EXAM: DG HIP (WITH OR WITHOUT PELVIS) 2-3V RIGHT COMPARISON:  None. FINDINGS: Bones are diffusely demineralized. Intertrochanteric right femoral neck fracture evident with varus angulation. Degenerative changes are seen at the symphysis pubis. IMPRESSION: Right intertrochanteric femoral neck fracture with varus angulation. Electronically Signed   By: Misty Stanley M.D.   On: 09/22/2017 12:50    Procedures Procedures (including critical care time)  Medications Ordered in ED Medications - No data to  display   Initial Impression / Assessment and Plan / ED Course  I have reviewed the triage vital signs and the nursing notes.  Pertinent labs & imaging results that were available during my care of the patient were reviewed by me and considered in my medical decision making (see chart for details).     Patient is a fractured hip.  She will be transferred to [facility] and Dr.Haddix will fix the hip  Final Clinical Impressions(s) / ED Diagnoses   Final diagnoses:  Closed fracture dislocation of right hip joint, initial encounter [facility])    ED Discharge Orders    None       Milton Ferguson, MD 09/22/17 1443

## 2017-09-22 NOTE — ED Notes (Addendum)
Report given to [address] RN

## 2017-09-22 NOTE — Progress Notes (Signed)
Pt arrived to 5[address] rm 6. Pt IV is occluded will remove. Pt skin is intact. Pt has states she has pain at her right hip. Pt's speech is hard to understand due to h/o of stroke.

## 2017-09-22 NOTE — ED Triage Notes (Signed)
Pt states leg gave out and she fell. Hurting to right hip area. Pt has stroke couple years ago that effects her speech.

## 2017-09-22 NOTE — Telephone Encounter (Signed)
Advised to take her to the ER for evaluation

## 2017-09-23 ENCOUNTER — Inpatient Hospital Stay (HOSPITAL_COMMUNITY): Payer: Medicare Other | Admitting: Certified Registered"

## 2017-09-23 ENCOUNTER — Encounter (HOSPITAL_COMMUNITY): Payer: Self-pay | Admitting: Certified Registered"

## 2017-09-23 ENCOUNTER — Inpatient Hospital Stay (HOSPITAL_COMMUNITY): Payer: Medicare Other

## 2017-09-23 ENCOUNTER — Encounter (HOSPITAL_COMMUNITY)
Admission: EM | Disposition: A | Discharge status: Home / Self Care | Payer: Self-pay | Source: Home / Self Care | Attending: Internal Medicine

## 2017-09-23 HISTORY — PX: FEMUR IM NAIL: SHX1597

## 2017-09-23 LAB — CBC
HCT: 28.6 % — ABNORMAL LOW (ref 36.0–46.0)
Hemoglobin: 9.4 g/dL — ABNORMAL LOW (ref 12.0–15.0)
MCH: 31.9 pg (ref 26.0–34.0)
MCHC: 32.9 g/dL (ref 30.0–36.0)
MCV: 96.9 fL (ref 78.0–100.0)
Platelets: 163 10*3/uL (ref 150–400)
RBC: 2.95 MIL/uL — ABNORMAL LOW (ref 3.87–5.11)
RDW: 16.9 % — ABNORMAL HIGH (ref 11.5–15.5)
WBC: 7.6 10*3/uL (ref 4.0–10.5)

## 2017-09-23 LAB — MAGNESIUM: Magnesium: 1.6 mg/dL — ABNORMAL LOW (ref 1.7–2.4)

## 2017-09-23 LAB — CREATININE, SERUM
Creatinine, Ser: 0.94 mg/dL (ref 0.44–1.00)
GFR calc Af Amer: >60 mL/min (ref >60)
GFR calc non Af Amer: >60 mL/min (ref >60)

## 2017-09-23 LAB — SURGICAL PCR SCREEN
MRSA, PCR: NEGATIVE
Staphylococcus aureus: NEGATIVE

## 2017-09-23 LAB — HIV ANTIBODY (ROUTINE TESTING W REFLEX): HIV Screen 4th Generation wRfx: NONREACTIVE

## 2017-09-23 SURGERY — INSERTION, INTRAMEDULLARY ROD, FEMUR
Anesthesia: General | Site: Hip | Laterality: Right

## 2017-09-23 MED ORDER — PHENYLEPHRINE HCL 10 MG/ML IJ SOLN
INTRAMUSCULAR | Status: DC | PRN
  Administered 2017-09-23 (×2): 200 ug via INTRAVENOUS

## 2017-09-23 MED ORDER — BISACODYL 10 MG RE SUPP: 10.0000 mg | Freq: Every day | RECTAL | Status: DC | PRN

## 2017-09-23 MED ORDER — OXYCODONE HCL 5 MG PO TABS: 5.0000 mg | ORAL_TABLET | Freq: Once | ORAL | Status: DC | PRN

## 2017-09-23 MED ORDER — ACETAMINOPHEN 325 MG PO TABS: 325.0000 mg | ORAL_TABLET | ORAL | Status: DC | PRN

## 2017-09-23 MED ORDER — FENTANYL CITRATE (PF) 100 MCG/2ML IJ SOLN
INTRAMUSCULAR | Status: AC
  Administered 2017-09-23: 50 ug via INTRAVENOUS
  Filled 2017-09-23: qty 2

## 2017-09-23 MED ORDER — CEFAZOLIN SODIUM-DEXTROSE 2-4 GM/100ML-% IV SOLN
2.0000 g | Freq: Four times a day (QID) | INTRAVENOUS | Status: AC
  Administered 2017-09-23 – 2017-09-24 (×2): 2 g via INTRAVENOUS
  Filled 2017-09-23 (×2): qty 100

## 2017-09-23 MED ORDER — DEXAMETHASONE SODIUM PHOSPHATE 10 MG/ML IJ SOLN
INTRAMUSCULAR | Status: DC | PRN
  Administered 2017-09-23: 10 mg via INTRAVENOUS

## 2017-09-23 MED ORDER — ENOXAPARIN SODIUM 40 MG/0.4ML ~~LOC~~ SOLN
40.0000 mg | SUBCUTANEOUS | Status: DC
  Administered 2017-09-24 – 2017-09-26 (×3): 40 mg via SUBCUTANEOUS
  Filled 2017-09-23 (×3): qty 0.4

## 2017-09-23 MED ORDER — ONDANSETRON HCL 4 MG/2ML IJ SOLN
INTRAMUSCULAR | Status: AC
  Filled 2017-09-23: qty 2

## 2017-09-23 MED ORDER — FLEET ENEMA 7-19 GM/118ML RE ENEM: 1.0000 | ENEMA | Freq: Once | RECTAL | Status: DC | PRN

## 2017-09-23 MED ORDER — FENTANYL CITRATE (PF) 250 MCG/5ML IJ SOLN
INTRAMUSCULAR | Status: AC
  Filled 2017-09-23: qty 5

## 2017-09-23 MED ORDER — MAGNESIUM SULFATE 2 GM/50ML IV SOLN
2.0000 g | Freq: Once | INTRAVENOUS | Status: AC
  Administered 2017-09-23: 2 g via INTRAVENOUS
  Filled 2017-09-23: qty 50

## 2017-09-23 MED ORDER — HYDROCODONE-ACETAMINOPHEN 5-325 MG PO TABS: 1.0000 | ORAL_TABLET | Freq: Four times a day (QID) | ORAL | 0 refills | Status: AC | PRN

## 2017-09-23 MED ORDER — KETOROLAC TROMETHAMINE 15 MG/ML IJ SOLN
7.5000 mg | Freq: Four times a day (QID) | INTRAMUSCULAR | Status: DC
  Administered 2017-09-23 – 2017-09-24 (×3): 7.5 mg via INTRAVENOUS
  Filled 2017-09-23 (×3): qty 1

## 2017-09-23 MED ORDER — SODIUM CHLORIDE 0.9 % IV SOLN
INTRAVENOUS | Status: DC
  Administered 2017-09-23 – 2017-09-24 (×4): via INTRAVENOUS

## 2017-09-23 MED ORDER — OXYCODONE HCL 5 MG/5ML PO SOLN: 5.0000 mg | Freq: Once | ORAL | Status: DC | PRN

## 2017-09-23 MED ORDER — FENTANYL CITRATE (PF) 100 MCG/2ML IJ SOLN
INTRAMUSCULAR | Status: AC
  Filled 2017-09-23: qty 2

## 2017-09-23 MED ORDER — MEPERIDINE HCL 50 MG/ML IJ SOLN: 6.2500 mg | INTRAMUSCULAR | Status: DC | PRN

## 2017-09-23 MED ORDER — LIDOCAINE 2% (20 MG/ML) 5 ML SYRINGE
INTRAMUSCULAR | Status: AC
  Filled 2017-09-23: qty 5

## 2017-09-23 MED ORDER — FENTANYL CITRATE (PF) 100 MCG/2ML IJ SOLN
50.0000 ug | Freq: Once | INTRAMUSCULAR | Status: AC
  Administered 2017-09-23: 50 ug via INTRAVENOUS

## 2017-09-23 MED ORDER — METOCLOPRAMIDE HCL 5 MG PO TABS: 5.0000 mg | ORAL_TABLET | Freq: Three times a day (TID) | ORAL | Status: DC | PRN

## 2017-09-23 MED ORDER — ONDANSETRON HCL 4 MG/2ML IJ SOLN: 4.0000 mg | Freq: Four times a day (QID) | INTRAMUSCULAR | Status: DC | PRN

## 2017-09-23 MED ORDER — ENOXAPARIN SODIUM 40 MG/0.4ML ~~LOC~~ SOLN: 40.0000 mg | SUBCUTANEOUS | 0 refills | Status: DC

## 2017-09-23 MED ORDER — POVIDONE-IODINE 10 % EX SWAB: 2.0000 "application " | Freq: Once | CUTANEOUS | Status: DC

## 2017-09-23 MED ORDER — DEXAMETHASONE SODIUM PHOSPHATE 10 MG/ML IJ SOLN
INTRAMUSCULAR | Status: AC
  Filled 2017-09-23: qty 1

## 2017-09-23 MED ORDER — FENTANYL CITRATE (PF) 100 MCG/2ML IJ SOLN
25.0000 ug | INTRAMUSCULAR | Status: DC | PRN
  Administered 2017-09-23: 25 ug via INTRAVENOUS

## 2017-09-23 MED ORDER — LACTATED RINGERS IV SOLN
INTRAVENOUS | Status: DC
  Administered 2017-09-23: 14:00:00 via INTRAVENOUS

## 2017-09-23 MED ORDER — DOCUSATE SODIUM 100 MG PO CAPS
100.0000 mg | ORAL_CAPSULE | Freq: Two times a day (BID) | ORAL | Status: DC
  Administered 2017-09-23 – 2017-09-26 (×6): 100 mg via ORAL
  Filled 2017-09-23 (×6): qty 1

## 2017-09-23 MED ORDER — SUGAMMADEX SODIUM 200 MG/2ML IV SOLN
INTRAVENOUS | Status: DC | PRN
  Administered 2017-09-23: 140 mg via INTRAVENOUS

## 2017-09-23 MED ORDER — POLYETHYLENE GLYCOL 3350 17 G PO PACK: 17.0000 g | PACK | Freq: Every day | ORAL | Status: DC | PRN

## 2017-09-23 MED ORDER — ONDANSETRON HCL 4 MG/2ML IJ SOLN
INTRAMUSCULAR | Status: DC | PRN
  Administered 2017-09-23: 4 mg via INTRAVENOUS

## 2017-09-23 MED ORDER — METOCLOPRAMIDE HCL 5 MG/ML IJ SOLN: 5.0000 mg | Freq: Three times a day (TID) | INTRAMUSCULAR | Status: DC | PRN

## 2017-09-23 MED ORDER — CEFAZOLIN SODIUM-DEXTROSE 2-4 GM/100ML-% IV SOLN
2.0000 g | INTRAVENOUS | Status: AC
  Administered 2017-09-23: 2 g via INTRAVENOUS
  Filled 2017-09-23: qty 100

## 2017-09-23 MED ORDER — FENTANYL CITRATE (PF) 250 MCG/5ML IJ SOLN
INTRAMUSCULAR | Status: DC | PRN
  Administered 2017-09-23: 50 ug via INTRAVENOUS
  Administered 2017-09-23: 150 ug via INTRAVENOUS

## 2017-09-23 MED ORDER — PHENYLEPHRINE 40 MCG/ML (10ML) SYRINGE FOR IV PUSH (FOR BLOOD PRESSURE SUPPORT)
PREFILLED_SYRINGE | INTRAVENOUS | Status: AC
  Filled 2017-09-23: qty 10

## 2017-09-23 MED ORDER — PROPOFOL 10 MG/ML IV BOLUS
INTRAVENOUS | Status: DC | PRN
  Administered 2017-09-23: 70 mg via INTRAVENOUS

## 2017-09-23 MED ORDER — ACETAMINOPHEN 160 MG/5ML PO SOLN: 325.0000 mg | ORAL | Status: DC | PRN

## 2017-09-23 MED ORDER — CEFAZOLIN SODIUM-DEXTROSE 2-4 GM/100ML-% IV SOLN
INTRAVENOUS | Status: AC
  Filled 2017-09-23: qty 100

## 2017-09-23 MED ORDER — ONDANSETRON HCL 4 MG PO TABS: 4.0000 mg | ORAL_TABLET | Freq: Four times a day (QID) | ORAL | Status: DC | PRN

## 2017-09-23 MED ORDER — MIDAZOLAM HCL 2 MG/2ML IJ SOLN
INTRAMUSCULAR | Status: DC | PRN
  Administered 2017-09-23: 1 mg via INTRAVENOUS

## 2017-09-23 MED ORDER — ROCURONIUM BROMIDE 10 MG/ML (PF) SYRINGE
PREFILLED_SYRINGE | INTRAVENOUS | Status: AC
  Filled 2017-09-23: qty 5

## 2017-09-23 MED ORDER — MIDAZOLAM HCL 2 MG/2ML IJ SOLN
INTRAMUSCULAR | Status: AC
  Filled 2017-09-23: qty 2

## 2017-09-23 MED ORDER — 0.9 % SODIUM CHLORIDE (POUR BTL) OPTIME
TOPICAL | Status: DC | PRN
  Administered 2017-09-23: 1000 mL

## 2017-09-23 MED ORDER — LIDOCAINE HCL (CARDIAC) PF 100 MG/5ML IV SOSY
PREFILLED_SYRINGE | INTRAVENOUS | Status: DC | PRN
  Administered 2017-09-23: 100 mg via INTRATRACHEAL

## 2017-09-23 MED ORDER — PROPOFOL 10 MG/ML IV BOLUS
INTRAVENOUS | Status: AC
Start: 2017-09-23 — End: ?
  Filled 2017-09-23: qty 20

## 2017-09-23 MED ORDER — ONDANSETRON HCL 4 MG/2ML IJ SOLN: 4.0000 mg | Freq: Once | INTRAMUSCULAR | Status: DC | PRN

## 2017-09-23 MED ORDER — ROCURONIUM BROMIDE 100 MG/10ML IV SOLN
INTRAVENOUS | Status: DC | PRN
  Administered 2017-09-23: 50 mg via INTRAVENOUS

## 2017-09-23 MED ORDER — CHLORHEXIDINE GLUCONATE 4 % EX LIQD
60.0000 mL | Freq: Once | CUTANEOUS | Status: AC
  Administered 2017-09-23: 4 via TOPICAL
  Filled 2017-09-23: qty 45

## 2017-09-23 SURGICAL SUPPLY — 35 items
CLSR STERI-STRIP ANTIMIC 1/2X4 (GAUZE/BANDAGES/DRESSINGS) ×2 IMPLANT
COVER PERINEAL POST (MISCELLANEOUS) ×2 IMPLANT
COVER SURGICAL LIGHT HANDLE (MISCELLANEOUS) ×2 IMPLANT
DRAPE STERI IOBAN 125X83 (DRAPES) ×2 IMPLANT
DRSG MEPILEX BORDER 4X4 (GAUZE/BANDAGES/DRESSINGS) ×4 IMPLANT
DRSG MEPITEL 4X7.2 (GAUZE/BANDAGES/DRESSINGS) ×1 IMPLANT
DURAPREP 26ML APPLICATOR (WOUND CARE) ×2 IMPLANT
ELECT REM PT RETURN 9FT ADLT (ELECTROSURGICAL) ×2
ELECTRODE REM PT RTRN 9FT ADLT (ELECTROSURGICAL) ×1 IMPLANT
GAUZE SPONGE 4X4 12PLY STRL (GAUZE/BANDAGES/DRESSINGS) ×1 IMPLANT
GLOVE BIO SURGEON STRL SZ7.5 (GLOVE) ×4 IMPLANT
GLOVE BIOGEL PI IND STRL 8 (GLOVE) ×2 IMPLANT
GLOVE BIOGEL PI INDICATOR 8 (GLOVE) ×2
GOWN STRL REUS W/ TWL LRG LVL3 (GOWN DISPOSABLE) ×3 IMPLANT
GOWN STRL REUS W/TWL LRG LVL3 (GOWN DISPOSABLE) ×6
GUIDEROD T2 3X1000 (ROD) ×1 IMPLANT
K-WIRE  3.2X450M STR (WIRE) ×1
K-WIRE 3.2X450M STR (WIRE) ×1
KIT BASIN OR (CUSTOM PROCEDURE TRAY) ×2 IMPLANT
KIT NAIL LONG 10X380MMX125 (Nail) ×1 IMPLANT
KIT TURNOVER KIT B (KITS) ×2 IMPLANT
KWIRE 3.2X450M STR (WIRE) IMPLANT
MANIFOLD NEPTUNE II (INSTRUMENTS) ×2 IMPLANT
NS IRRIG 1000ML POUR BTL (IV SOLUTION) ×2 IMPLANT
PACK GENERAL/GYN (CUSTOM PROCEDURE TRAY) ×2 IMPLANT
PAD ARMBOARD 7.5X6 YLW CONV (MISCELLANEOUS) ×4 IMPLANT
SCREW LAG GAMMA 3 95MM (Screw) ×1 IMPLANT
STRIP CLOSURE SKIN 1/2X4 (GAUZE/BANDAGES/DRESSINGS) ×1 IMPLANT
SUT MNCRL AB 4-0 PS2 18 (SUTURE) ×1 IMPLANT
SUT MON AB 2-0 CT1 36 (SUTURE) ×1 IMPLANT
SUT VIC AB 0 CT1 27 (SUTURE) ×2
SUT VIC AB 0 CT1 27XBRD ANBCTR (SUTURE) ×1 IMPLANT
TOWEL OR 17X24 6PK STRL BLUE (TOWEL DISPOSABLE) ×2 IMPLANT
TOWEL OR 17X26 10 PK STRL BLUE (TOWEL DISPOSABLE) ×2 IMPLANT
WATER STERILE IRR 1000ML POUR (IV SOLUTION) ×2 IMPLANT

## 2017-09-23 NOTE — Interval H&P Note (Signed)
History and Physical Interval Note:  09/23/2017 12:45 PM  Danielle Cruz  has presented today for surgery, with the diagnosis of Right hip fx  The various methods of treatment have been discussed with the patient and family. After consideration of risks, benefits and other options for treatment, the patient has consented to  Procedure(s): INTRAMEDULLARY (IM) NAIL FEMORAL (Right) as a surgical intervention .  The patient's history has been reviewed, patient examined, no change in status, stable for surgery.  I have reviewed the patient's chart and labs.  Questions were answered to the patient's satisfaction.     [name]

## 2017-09-23 NOTE — Transfer of Care (Signed)
Immediate Anesthesia Transfer of Care Note  Patient: Danielle Cruz  Procedure(s) Performed: INTRAMEDULLARY (IM) NAIL FEMORAL (Right Hip)  Patient Location: PACU  Anesthesia Type:General  Level of Consciousness: awake and patient cooperative  Airway & Oxygen Therapy: Patient Spontanous Breathing  Post-op Assessment: Report given to RN and Post -op Vital signs reviewed and stable  Post vital signs: Reviewed and stable  Last Vitals:  Vitals Value Taken Time  BP 155/60 09/23/2017  3:36 PM  Temp 36.8 C 09/23/2017  3:36 PM  Pulse 84 09/23/2017  3:38 PM  Resp 15 09/23/2017  3:38 PM  SpO2 93 % 09/23/2017  3:38 PM  Vitals shown include unvalidated device data.  Last Pain:  Vitals:   09/23/17 0728  TempSrc:   PainSc: Asleep         Complications: No apparent anesthesia complications

## 2017-09-23 NOTE — Anesthesia Preprocedure Evaluation (Addendum)
Anesthesia Evaluation  Patient identified by MRN, date of birth, ID band Patient awake    Reviewed: Allergy & Precautions, H&P , NPO status , Patient's Chart, lab work & pertinent test results, reviewed documented beta blocker date and time   Airway Mallampati: II  TM Distance: >3 FB Neck ROM: full    Dental no notable dental hx.    Pulmonary neg pulmonary ROS, Current Smoker,    Pulmonary exam normal breath sounds clear to auscultation       Cardiovascular Exercise Tolerance: Good hypertension, Pt. on medications + CAD  negative cardio ROS   Rhythm:regular Rate:Normal  ECHO 8/16  Study Conclusions  - Left ventricle: The cavity size was normal. Wall thickness was   increased in a pattern of mild LVH. Systolic function was normal.   The estimated ejection fraction was in the range of 60% to 65%.   Wall motion was normal; there were no regional wall motion   abnormalities. Doppler parameters are consistent with abnormal   left ventricular relaxation (grade 1 diastolic dysfunction).   Neuro/Psych CT 09/23/17 IMPRESSION: 1. Some progression of the chronic left MCA infarct since the 2011 CT, but with a chronic appearance. 2. No acute cortically based infarct, acute intracranial hemorrhage, or other acute intracranial abnormality identified. CVA, Residual Symptoms negative psych ROS   GI/Hepatic negative GI ROS, Neg liver ROS,   Endo/Other  negative endocrine ROS  Renal/GU ARFRenal disease  negative genitourinary   Musculoskeletal  (+) Arthritis , Rheumatoid disorders,    Abdominal   Peds  (+) ADHD Hematology  (+) anemia ,   Anesthesia Other Findings   Reproductive/Obstetrics negative OB ROS                           Anesthesia Physical Anesthesia Plan  ASA: II  Anesthesia Plan: General   Post-op Pain Management:    Induction: Intravenous  PONV Risk Score and Plan:   Airway  Management Planned: LMA and Oral ETT  Additional Equipment:   Intra-op Plan:   Post-operative Plan: Extubation in OR  Informed Consent: I have reviewed the patients History and Physical, chart, labs and discussed the procedure including the risks, benefits and alternatives for the proposed anesthesia with the patient or authorized representative who has indicated his/her understanding and acceptance.   Dental Advisory Given  Plan Discussed with: CRNA, Anesthesiologist and Surgeon  Anesthesia Plan Comments: ( )       Anesthesia Quick Evaluation

## 2017-09-23 NOTE — Anesthesia Procedure Notes (Signed)

## 2017-09-23 NOTE — H&P (View-Only) (Signed)
Reason for Consult:Right hip fx  Referring Physician: Sharmon Cruz  Danielle Cruz is an 62 y.o. female.  HPI: Danielle Cruz was coming out of the bathroom at home when she tripped and fell onto her right side. She had immediate pain and could not get up. She was brought to the ED at [facility] where x-rays showed a right intertroch hip fx. She was transferred to [facility] for definitive care. She is nonverbal from a stroke so history provided by sister at bedside. However, she understands language and will follow all commands.  Past Medical History:  Diagnosis Date  . AKI (acute kidney injury) ([place]) 09/22/2017  . Anemia   . 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 Arthritis   . Colonic polyp 2010   Hemorrhoids; h/o mild hematochezia  . CVA (cerebral infarction) 08/2008   sizable left -residual expressive aphasia, right sided weakness; ambulates with difficulty with the right leg brace   . Hyperlipidemia   . Hypertension 2005  . Rheumatoid arthritis(714.0)   . Stroke ([place])   . Tobacco abuse, in remission    Quit in 2010; 30-pack-year history    Past Surgical History:  Procedure Laterality Date  . ANKLE SURGERY     Right  . CAROTID STENT INSERTION    . COLONOSCOPY W/ POLYPECTOMY  11/2008   Dr. Gala Romney. Left-sided diverticula, Pedunculated polyp snared (no adenomatous changes)  . OOPHORECTOMY      Family History  Problem Relation Age of Onset  . Cancer Father        prostate, deceased 48s  . Breast cancer Sister        Deceased 71s  . Heart attack Mother        deceased 49, during childbirth  . Cancer Brother        lymphoma  . Colon cancer Maternal Aunt        older than age 29  . Liver disease Neg Hx     Social History:  reports that she has been smoking cigarettes.  She has been smoking about 0.50 packs per day. She has never used smokeless tobacco. She reports that she does not drink alcohol or use drugs.  Allergies:   Allergies  Allergen Reactions  . Ace Inhibitors Cough    New daily cough since starting ACE    Medications: I have reviewed the patient's current medications.  Results for orders placed or performed during the hospital encounter of 09/22/17 (from the past 48 hour(s))  CBC with Differential/Platelet     Status: Abnormal   Collection Time: 09/22/17  2:12 PM  Result Value Ref Range   WBC 10.5 4.0 - 10.5 K/uL   RBC 3.06 (L) 3.87 - 5.11 MIL/uL   Hemoglobin 9.9 (L) 12.0 - 15.0 g/dL   HCT 30.5 (L) 36.0 - 46.0 %   MCV 99.7 78.0 - 100.0 fL   MCH 32.4 26.0 - 34.0 pg   MCHC 32.5 30.0 - 36.0 g/dL   RDW 16.5 (H) 11.5 - 15.5 %   Platelets 186 150 - 400 K/uL   Neutrophils Relative % 82 %   Neutro Abs 8.7 (H) 1.7 - 7.7 K/uL   Lymphocytes Relative 9 %   Lymphs Abs 0.9 0.7 - 4.0 K/uL   Monocytes Relative 9 %   Monocytes Absolute 0.9 0.1 - 1.0 K/uL   Eosinophils Relative 0 %   Eosinophils Absolute 0.0 0.0 - 0.7  K/uL   Basophils Relative 0 %   Basophils Absolute 0.0 0.0 - 0.1 K/uL    Comment: Performed at [facility], [address], [place], [place] [address]  Comprehensive metabolic panel     Status: Abnormal   Collection Time: 09/22/17  2:12 PM  Result Value Ref Range   Sodium 137 135 - 145 mmol/L   Potassium 3.8 3.5 - 5.1 mmol/L   Chloride 103 101 - 111 mmol/L   CO2 25 22 - 32 mmol/L   Glucose, Bld 101 (H) 65 - 99 mg/dL   BUN 10 6 - 20 mg/dL   Creatinine, Ser 1.15 (H) 0.44 - 1.00 mg/dL   Calcium 8.8 (L) 8.9 - 10.3 mg/dL   Total Protein 6.7 6.5 - 8.1 g/dL   Albumin 3.2 (L) 3.5 - 5.0 g/dL   AST 33 15 - 41 U/L   ALT 32 14 - 54 U/L   Alkaline Phosphatase 74 38 - 126 U/L   Total Bilirubin 0.6 0.3 - 1.2 mg/dL   GFR calc non Af Amer 50 (L) >60 mL/min   GFR calc Af Amer 58 (L) >60 mL/min    Comment: (NOTE) The eGFR has been calculated using the CKD EPI equation. This calculation has not been validated in all clinical situations. eGFR's persistently <60 mL/min signify possible Chronic  Kidney Disease.    Anion gap 9 5 - 15    Comment: Performed at [facility], [address] [place], [place] [address]  HIV antibody (Routine Testing)     Status: None   Collection Time: 09/22/17  3:09 PM  Result Value Ref Range   HIV Screen 4th Generation wRfx Non Reactive Non Reactive    Comment: (NOTE) Performed At: [facility] [place], [place] [id number] Rush Farmer MD [id number] Performed at [facility], [address] [place], [place] [address]     Dg Hip Unilat  With Pelvis 2-3 Views Right  Result Date: 09/22/2017 CLINICAL DATA:  Fall.  Hip pain. EXAM: DG HIP (WITH OR WITHOUT PELVIS) 2-3V RIGHT COMPARISON:  None. FINDINGS: Bones are diffusely demineralized. Intertrochanteric right femoral neck fracture evident with varus angulation. Degenerative changes are seen at the symphysis pubis. IMPRESSION: Right intertrochanteric femoral neck fracture with varus angulation. Electronically Signed   By: Misty Stanley M.D.   On: 09/22/2017 12:50    Review of Systems  Unable to perform ROS: Patient nonverbal   Blood pressure 139/89, pulse 81, temperature 98.6 F (37 C), temperature source Oral, resp. rate 17, height '5\' 8"'  (1.727 m), weight 67.1 kg (148 lb), SpO2 100 %. Physical Exam  Constitutional: She appears well-developed and well-nourished. No distress.  HENT:  Head: Normocephalic and atraumatic.  Eyes: Conjunctivae are normal. Right eye exhibits no discharge. Left eye exhibits no discharge. No scleral icterus.  Neck: Normal range of motion.  Cardiovascular: Normal rate and regular rhythm.  Respiratory: Effort normal. No respiratory distress.  Musculoskeletal:  RLE No traumatic wounds, ecchymosis, or rash  TTP hip  No knee or ankle effusion  Knee stable to varus/ valgus and anterior/posterior stress  Sens DPN, SPN, TN intact  Motor EHL, ext, flex, evers 5/5  DP 2+, PT 2+, 2+ NP edema  Neurological: She is alert.  Skin: Skin is warm and dry. She  is not diaphoretic.  Psychiatric: She has a normal mood and affect. Her behavior is normal.    Assessment/Plan: Fall Right hip fx -- For IMN this afternoon by Dr. Percell Miller. NPO until then.  Multiple medical problems -- per primary service    Lisette Abu, PA-C Orthopedic Surgery [phone] 09/23/2017, 8:52 AM

## 2017-09-23 NOTE — Progress Notes (Addendum)
PROGRESS NOTE    Danielle Cruz  ZDG:[id number] DOB: 1955/11/14 DOA: 09/22/2017 PCP: Fayrene Helper, MD    Brief Narrative:  Danielle Cruz is a 62 y.o. female with medical history significant for hypertension, dyslipidemia, rheumatoid arthritis, tobacco abuse, CAD with prior stents, and prior CVA with right-sided hemiplegia who ambulates with a quad cane and uses a wheelchair at home.  She had a fall earlier today as she was walking out of her bathroom.  According to the family member at the bedside her leg just got caught in the doorway and she fell onto her right hip and developed excruciating pain.  She states that the pain is worse with movement but is otherwise rated 0 out of 10 at rest.  She denies any radiation the pain.  The patient can understand everything that is being asked and nods to questions, but is otherwise nonverbal.   ED Course: Vital signs are stable and EKG demonstrates motion artifact, but she is currently in normal sinus rhythm.  Right hip x-ray with right intertrochanteric femoral neck fracture and varus angulation noted.  Laboratory data with some mild creatinine elevation of 1.15 with baseline of 0.81 noted previously.  She is also noted to have stable anemia.  No overt bleeding identified.  ED physician has spoken with Dr. Doreatha Martin of orthopedics who recommends transfer to [facility] for fixation.   Assessment & Plan:   Principal Problem:   Hip fracture ([place]) Active Problems:   Hyperlipemia   Essential hypertension   Hemiplegia, late effect of cerebrovascular disease (HCC)   Rheumatoid arthritis (HCC)   CAD in native artery   AKI (acute kidney injury) ([place])  Right intertrochanteric femoral neck fracture status post mechanical fall CT head done was negative for acute stroke, she has some chronic changes prior stroke. Discussed CT and case with neurology. CT finding appears to be chronic, no need to do MRI prior sx.  EKG no st elevation. Sinus. She denies chest pain  or dyspnea.  For sx today.   AKI; continue with IV fluids.  Stable.   Hypomagnesemia;  Replete IV   Prior CVA with right-sided hemiparesis.  Continue with aspirin and statins.   HTN; Continue with metoprolol.   CAD; statin, aspirin, metoprolol/  RA; Leflunomide.   EKG ? A flutter. monitor on telemetry. Will review EKG with cardiology    DVT prophylaxis: Heparin  Code Status:  Full code.  Family Communication: care discussed with sister.  Disposition Plan: OR today   Consultants:   Dr Percell Miller    Procedures:  none   Antimicrobials:  none   Subjective: She is alert, aphasia, chronic.  Chronic right side weakness. She doesn't know how she fell. Denies chest pain, dyspnea, lightheaded prior falling.  She is not able to say if she has any worsening right side weakness.  She denies chest pain or dyspnea.   Objective: Vitals:   09/22/17 2000 09/22/17 2030 09/22/17 2150 09/23/17 0507  BP: 140/72 (!) 151/66 (!) 164/75 139/89  Pulse: 77 74 78 81  Resp: (!) 25 (!) 24 17   Temp:    98.6 F (37 C)  TempSrc:    Oral  SpO2: 98% 99% 100% 100%  Weight:      Height:        Intake/Output Summary (Last 24 hours) at 09/23/2017 0726 Last data filed at 09/23/2017 0300 Gross per 24 hour  Intake 973.75 ml  Output -  Net 973.75 ml   [employer]  09/22/17 1221  Weight: 67.1 kg (148 lb)    Examination:  General exam: Appears calm and comfortable  Respiratory system: Clear to auscultation. Respiratory effort normal. Cardiovascular system: S1 & S2 heard, RRR. No JVD, murmurs, rubs, gallops or clicks. No pedal edema. Gastrointestinal system: Abdomen is nondistended, soft and nontender. No organomegaly or masses felt. Normal bowel sounds heard. Central nervous system: Alert and oriented. Chronic right side weakness, shorter right LE>  Extremities: Symmetric 5 x 5 power. Skin: No rashes, lesions or ulcers     Data Reviewed: I have personally reviewed following labs  and imaging studies  CBC: Recent Labs  Lab 09/22/17 1412  WBC 10.5  NEUTROABS 8.7*  HGB 9.9*  HCT 30.5*  MCV 99.7  PLT 161   Basic Metabolic Panel: Recent Labs  Lab 09/22/17 1412  NA 137  K 3.8  CL 103  CO2 25  GLUCOSE 101*  BUN 10  CREATININE 1.15*  CALCIUM 8.8*   GFR: Estimated Creatinine Clearance: 51.2 mL/min (A) (by C-G formula based on SCr of 1.15 mg/dL (H)). Liver Function Tests: Recent Labs  Lab 09/22/17 1412  AST 33  ALT 32  ALKPHOS 74  BILITOT 0.6  PROT 6.7  ALBUMIN 3.2*   No results for input(s): LIPASE, AMYLASE in the last 168 hours. No results for input(s): AMMONIA in the last 168 hours. Coagulation Profile: No results for input(s): INR, PROTIME in the last 168 hours. Cardiac Enzymes: No results for input(s): CKTOTAL, CKMB, CKMBINDEX, TROPONINI in the last 168 hours. BNP (last 3 results) No results for input(s): PROBNP in the last 8760 hours. HbA1C: No results for input(s): HGBA1C in the last 72 hours. CBG: No results for input(s): GLUCAP in the last 168 hours. Lipid Profile: No results for input(s): CHOL, HDL, LDLCALC, TRIG, CHOLHDL, LDLDIRECT in the last 72 hours. Thyroid Function Tests: No results for input(s): TSH, T4TOTAL, FREET4, T3FREE, THYROIDAB in the last 72 hours. Anemia Panel: No results for input(s): VITAMINB12, FOLATE, FERRITIN, TIBC, IRON, RETICCTPCT in the last 72 hours. Sepsis Labs: No results for input(s): PROCALCITON, LATICACIDVEN in the last 168 hours.  No results found for this or any previous visit (from the past 240 hour(s)).       Radiology Studies: Dg Hip Unilat  With Pelvis 2-3 Views Right  Result Date: 09/22/2017 CLINICAL DATA:  Fall.  Hip pain. EXAM: DG HIP (WITH OR WITHOUT PELVIS) 2-3V RIGHT COMPARISON:  None. FINDINGS: Bones are diffusely demineralized. Intertrochanteric right femoral neck fracture evident with varus angulation. Degenerative changes are seen at the symphysis pubis. IMPRESSION: Right  intertrochanteric femoral neck fracture with varus angulation. Electronically Signed   By: Misty Stanley M.D.   On: 09/22/2017 12:50        Scheduled Meds: . aspirin EC  81 mg Oral Daily  . atorvastatin  80 mg Oral q1800  . baclofen  10 mg Oral BID  . fluticasone  1 spray Each Nare Daily  . folic acid  1 mg Oral Daily  . heparin  5,000 Units Subcutaneous Q8H  . leflunomide  20 mg Oral Daily  . metoprolol succinate  25 mg Oral Daily  . montelukast  10 mg Oral QHS  . multivitamin with minerals  1 tablet Oral Daily  . oxybutynin  2.5 mg Oral BID   Continuous Infusions: . sodium chloride       LOS: 1 day    Time spent: 35 minutes.     Elmarie Shiley, MD [facility] Pager [phone]  If 7PM-7AM, please contact night-coverage [email or web address] [facility] 09/23/2017, 7:26 AM

## 2017-09-23 NOTE — Anesthesia Postprocedure Evaluation (Signed)
Anesthesia Post Note  Patient: Keana C Crail  Procedure(s) Performed: INTRAMEDULLARY (IM) NAIL FEMORAL (Right Hip)     Patient location during evaluation: PACU Anesthesia Type: General Level of consciousness: awake and alert Pain management: pain level controlled Vital Signs Assessment: post-procedure vital signs reviewed and stable Respiratory status: spontaneous breathing, nonlabored ventilation, respiratory function stable and patient connected to nasal cannula oxygen Cardiovascular status: blood pressure returned to baseline and stable Postop Assessment: no apparent nausea or vomiting Anesthetic complications: no    Last Vitals:  Vitals:   09/23/17 0507 09/23/17 1536  BP: 139/89 (!) 155/60  Pulse: 81 79  Resp:  19  Temp: 37 C 36.8 C  SpO2: 100% 97%    Last Pain:  Vitals:   09/23/17 1536  TempSrc:   PainSc: Asleep                 Kayan Blissett

## 2017-09-23 NOTE — Consult Note (Signed)
Reason for Consult:Right hip fx  Referring Physician: Sharmon Leyden  Danielle Cruz is an 62 y.o. female.  HPI: Danielle Cruz was coming out of the bathroom at home when she tripped and fell onto her right side. She had immediate pain and could not get up. She was brought to the ED at [facility] where x-rays showed a right intertroch hip fx. She was transferred to [facility] for definitive care. She is nonverbal from a stroke so history provided by sister at bedside. However, she understands language and will follow all commands.  Past Medical History:  Diagnosis Date  . AKI (acute kidney injury) ([place]) 09/22/2017  . Anemia   . 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 Arthritis   . Colonic polyp 2010   Hemorrhoids; h/o mild hematochezia  . CVA (cerebral infarction) 08/2008   sizable left -residual expressive aphasia, right sided weakness; ambulates with difficulty with the right leg brace   . Hyperlipidemia   . Hypertension 2005  . Rheumatoid arthritis(714.0)   . Stroke ([place])   . Tobacco abuse, in remission    Quit in 2010; 30-pack-year history    Past Surgical History:  Procedure Laterality Date  . ANKLE SURGERY     Right  . CAROTID STENT INSERTION    . COLONOSCOPY W/ POLYPECTOMY  11/2008   Dr. Gala Romney. Left-sided diverticula, Pedunculated polyp snared (no adenomatous changes)  . OOPHORECTOMY      Family History  Problem Relation Age of Onset  . Cancer Father        prostate, deceased 49s  . Breast cancer Sister        Deceased 89s  . Heart attack Mother        deceased 54, during childbirth  . Cancer Brother        lymphoma  . Colon cancer Maternal Aunt        older than age 53  . Liver disease Neg Hx     Social History:  reports that she has been smoking cigarettes.  She has been smoking about 0.50 packs per day. She has never used smokeless tobacco. She reports that she does not drink alcohol or use drugs.  Allergies:   Allergies  Allergen Reactions  . Ace Inhibitors Cough    New daily cough since starting ACE    Medications: I have reviewed the patient's current medications.  Results for orders placed or performed during the hospital encounter of 09/22/17 (from the past 48 hour(s))  CBC with Differential/Platelet     Status: Abnormal   Collection Time: 09/22/17  2:12 PM  Result Value Ref Range   WBC 10.5 4.0 - 10.5 K/uL   RBC 3.06 (L) 3.87 - 5.11 MIL/uL   Hemoglobin 9.9 (L) 12.0 - 15.0 g/dL   HCT 30.5 (L) 36.0 - 46.0 %   MCV 99.7 78.0 - 100.0 fL   MCH 32.4 26.0 - 34.0 pg   MCHC 32.5 30.0 - 36.0 g/dL   RDW 16.5 (H) 11.5 - 15.5 %   Platelets 186 150 - 400 K/uL   Neutrophils Relative % 82 %   Neutro Abs 8.7 (H) 1.7 - 7.7 K/uL   Lymphocytes Relative 9 %   Lymphs Abs 0.9 0.7 - 4.0 K/uL   Monocytes Relative 9 %   Monocytes Absolute 0.9 0.1 - 1.0 K/uL   Eosinophils Relative 0 %   Eosinophils Absolute 0.0 0.0 - 0.7  K/uL   Basophils Relative 0 %   Basophils Absolute 0.0 0.0 - 0.1 K/uL    Comment: Performed at [facility], [address] [place], [place] [address]  Comprehensive metabolic panel     Status: Abnormal   Collection Time: 09/22/17  2:12 PM  Result Value Ref Range   Sodium 137 135 - 145 mmol/L   Potassium 3.8 3.5 - 5.1 mmol/L   Chloride 103 101 - 111 mmol/L   CO2 25 22 - 32 mmol/L   Glucose, Bld 101 (H) 65 - 99 mg/dL   BUN 10 6 - 20 mg/dL   Creatinine, Ser 1.15 (H) 0.44 - 1.00 mg/dL   Calcium 8.8 (L) 8.9 - 10.3 mg/dL   Total Protein 6.7 6.5 - 8.1 g/dL   Albumin 3.2 (L) 3.5 - 5.0 g/dL   AST 33 15 - 41 U/L   ALT 32 14 - 54 U/L   Alkaline Phosphatase 74 38 - 126 U/L   Total Bilirubin 0.6 0.3 - 1.2 mg/dL   GFR calc non Af Amer 50 (L) >60 mL/min   GFR calc Af Amer 58 (L) >60 mL/min    Comment: (NOTE) The eGFR has been calculated using the CKD EPI equation. This calculation has not been validated in all clinical situations. eGFR's persistently <60 mL/min signify possible Chronic  Kidney Disease.    Anion gap 9 5 - 15    Comment: Performed at [facility], [address] [place], [place] [address]  HIV antibody (Routine Testing)     Status: None   Collection Time: 09/22/17  3:09 PM  Result Value Ref Range   HIV Screen 4th Generation wRfx Non Reactive Non Reactive    Comment: (NOTE) Performed At: [facility] [place], [place] [id number] Rush Farmer MD HW:[id number] Performed at [facility], [address], [place], [place] [address]     Dg Hip Unilat  With Pelvis 2-3 Views Right  Result Date: 09/22/2017 CLINICAL DATA:  Fall.  Hip pain. EXAM: DG HIP (WITH OR WITHOUT PELVIS) 2-3V RIGHT COMPARISON:  None. FINDINGS: Bones are diffusely demineralized. Intertrochanteric right femoral neck fracture evident with varus angulation. Degenerative changes are seen at the symphysis pubis. IMPRESSION: Right intertrochanteric femoral neck fracture with varus angulation. Electronically Signed   By: Misty Stanley M.D.   On: 09/22/2017 12:50    Review of Systems  Unable to perform ROS: Patient nonverbal   Blood pressure 139/89, pulse 81, temperature 98.6 F (37 C), temperature source Oral, resp. rate 17, height '5\' 8"'  (1.727 m), weight 67.1 kg (148 lb), SpO2 100 %. Physical Exam  Constitutional: She appears well-developed and well-nourished. No distress.  HENT:  Head: Normocephalic and atraumatic.  Eyes: Conjunctivae are normal. Right eye exhibits no discharge. Left eye exhibits no discharge. No scleral icterus.  Neck: Normal range of motion.  Cardiovascular: Normal rate and regular rhythm.  Respiratory: Effort normal. No respiratory distress.  Musculoskeletal:  RLE No traumatic wounds, ecchymosis, or rash  TTP hip  No knee or ankle effusion  Knee stable to varus/ valgus and anterior/posterior stress  Sens DPN, SPN, TN intact  Motor EHL, ext, flex, evers 5/5  DP 2+, PT 2+, 2+ NP edema  Neurological: She is alert.  Skin: Skin is warm and dry. She  is not diaphoretic.  Psychiatric: She has a normal mood and affect. Her behavior is normal.    Assessment/Plan: Fall Right hip fx -- For IMN this afternoon by Dr. Percell Miller. NPO until then.  Multiple medical problems -- per primary service    Lisette Abu, PA-C Orthopedic Surgery [phone] 09/23/2017, 8:52 AM

## 2017-09-23 NOTE — Op Note (Signed)
DATE OF SURGERY:  09/23/2017  TIME: 3:11 PM  PATIENT NAME:  Danielle Cruz  AGE: 62 y.o.  PRE-OPERATIVE DIAGNOSIS:  Right hip fx  POST-OPERATIVE DIAGNOSIS:  SAME  PROCEDURE:  INTRAMEDULLARY (IM) NAIL FEMORAL  SURGEON:  Bowden Boody D  ASSISTANT:  Roxan Hockey, PA-C, he was present and scrubbed throughout the case, critical for completion in a timely fashion, and for retraction, instrumentation, and closure.   OPERATIVE IMPLANTS: [employer]ker Gamma Nail  PREOPERATIVE INDICATIONS:  Danielle Cruz is a 62 y.o. year old who fell and suffered a hip fracture. She was brought into the ER and then admitted and optimized and then elected for surgical intervention.    The risks benefits and alternatives were discussed with the patient including but not limited to the risks of nonoperative treatment, versus surgical intervention including infection, bleeding, nerve injury, malunion, nonunion, hardware prominence, hardware failure, need for hardware removal, blood clots, cardiopulmonary complications, morbidity, mortality, among others, and they were willing to proceed.    OPERATIVE PROCEDURE:  The patient was brought to the operating room and placed in the supine position. General anesthesia was administered. She was placed on the fracture table.  Closed reduction was performed under C-arm guidance. Time out was then performed after sterile prep and drape. She received preoperative antibiotics.  Incision was made proximal to the greater trochanter. A guidewire was placed in the appropriate position. Confirmation was made on AP and lateral views. The above-named nail was opened. I opened the proximal femur with a reamer. I then placed the nail by hand easily down. I did not need to ream the femur.  Once the nail was completely seated, I placed a guidepin into the femoral head into the center center position. I measured the length, and then reamed the lateral cortex and up into the head. I then placed  the lag screw. Slight compression was applied. Anatomic fixation achieved. Bone quality was mediocre.  I then secured the proximal interlocking bolt, and took off a half a turn, and then removed the instruments, and took final C-arm pictures AP and lateral the entire length of the leg.    Anatomic reconstruction was achieved, and the wounds were irrigated copiously and closed with Vicryl followed by staples and sterile gauze for the skin. The patient was awakened and returned to PACU in stable and satisfactory condition. There no complications and the patient tolerated the procedure well.  She will be weightbearing as tolerated, and will be on chemical px  for a period of four weeks after discharge.   Danielle Cruz, M.D.

## 2017-09-24 LAB — CBC
HCT: 24.4 % — ABNORMAL LOW (ref 36.0–46.0)
Hemoglobin: 8 g/dL — ABNORMAL LOW (ref 12.0–15.0)
MCH: 32.5 pg (ref 26.0–34.0)
MCHC: 32.8 g/dL (ref 30.0–36.0)
MCV: 99.2 fL (ref 78.0–100.0)
Platelets: 155 10*3/uL (ref 150–400)
RBC: 2.46 MIL/uL — ABNORMAL LOW (ref 3.87–5.11)
RDW: 16.9 % — ABNORMAL HIGH (ref 11.5–15.5)
WBC: 9.6 10*3/uL (ref 4.0–10.5)

## 2017-09-24 LAB — BASIC METABOLIC PANEL
Anion gap: 15 (ref 5–15)
BUN: 13 mg/dL (ref 6–20)
CO2: 18 mmol/L — ABNORMAL LOW (ref 22–32)
Calcium: 7.8 mg/dL — ABNORMAL LOW (ref 8.9–10.3)
Chloride: 104 mmol/L (ref 101–111)
Creatinine, Ser: 1.03 mg/dL — ABNORMAL HIGH (ref 0.44–1.00)
GFR calc Af Amer: >60 mL/min (ref >60)
GFR calc non Af Amer: 57 mL/min — ABNORMAL LOW (ref >60)
Glucose, Bld: 182 mg/dL — ABNORMAL HIGH (ref 65–99)
Potassium: 4.4 mmol/L (ref 3.5–5.1)
Sodium: 137 mmol/L (ref 135–145)

## 2017-09-24 LAB — MAGNESIUM: Magnesium: 2 mg/dL (ref 1.7–2.4)

## 2017-09-24 MED ORDER — FERROUS SULFATE 325 (65 FE) MG PO TABS
325.0000 mg | ORAL_TABLET | Freq: Two times a day (BID) | ORAL | Status: DC
  Administered 2017-09-24 – 2017-09-26 (×4): 325 mg via ORAL
  Filled 2017-09-24 (×4): qty 1

## 2017-09-24 NOTE — Evaluation (Signed)
Occupational Therapy Evaluation Patient Details Name: Rosezetta C Azbill MRN: [id number] DOB: 28-Jan-1956 Today's Date: 09/24/2017    History of Present Illness Pt is a 62 y.o. female with medical history significant for hypertension, dyslipidemia, rheumatoid arthritis, tobacco abuse, CAD with prior stents, and prior CVA with right-sided hemiplegia who ambulates with a quad cane and uses a wheelchair at home.  She fell at home sustaining R hip fx and underwent IM nailing 09/23/17.    Clinical Impression   PTA, pt was living at home with her husband and children. Pt had a PCA who assisted with BADLs and was using RW for functional mobility. Pt currently requiring Mod A for UB ADLs, Max A for LB ADLs, and Mod A +2 for functional transfers with sara stedy. Pt presenting with significant fear of falling and requiring increased time and encouragement to participate in ADLs and mobility. Daughter verbalizing that they want to transition home and are prepared to increase supervision for safety at home. Pt will require further acute OT to increase safety and independence with ADLs and functional mobility. Pending pt progress, recommend dc to home with HHOT to optimize safety, facilitate return to PLOF, and decrease caregiver burden.    Follow Up Recommendations  Home health OT;Supervision/Assistance - 24 hour    Equipment Recommendations  3 in 1 bedside commode    Recommendations for Other Services PT consult     Precautions / Restrictions Precautions Precautions: Fall Required Braces or Orthoses: Other Brace/Splint Other Brace/Splint: Pt wears R wrist splint due to deficits from previous CVA. Restrictions Weight Bearing Restrictions: Yes RLE Weight Bearing: Weight bearing as tolerated      Mobility Bed Mobility Overal bed mobility: Needs Assistance Bed Mobility: Sit to Supine     Supine to sit: HOB elevated;Mod assist Sit to supine: Mod assist   General bed mobility comments: Mod A for daughter  to manage BLEs  Transfers Overall transfer level: Needs assistance   Transfers: Sit to/from Stand Sit to Stand: Mod assist;+2 physical assistance         General transfer comment: Mod A +2 to power up into standing with use of sara stedy. Pt requiring MIn A to stand from sara stedy seat. Pt very fearful and requiring increased cues    Balance Overall balance assessment: Needs assistance Sitting-balance support: No upper extremity supported;Feet supported Sitting balance-Leahy Scale: Good     Standing balance support: Bilateral upper extremity supported;During functional activity Standing balance-Leahy Scale: Poor Standing balance comment: reliant on UE support in stance                           ADL either performed or assessed with clinical judgement   ADL Overall ADL's : Needs assistance/impaired Eating/Feeding: Minimal assistance Eating/Feeding Details (indicate cue type and reason): Pt eating lunch with LUE only Grooming: Minimal assistance;Sitting;With caregiver independent assisting Grooming Details (indicate cue type and reason): Using LUE only Upper Body Bathing: Moderate assistance;Sitting   Lower Body Bathing: Maximal assistance;Sit to/from stand   Upper Body Dressing : Moderate assistance;Sitting   Lower Body Dressing: Maximal assistance;Sit to/from stand   Toilet Transfer: +2 for physical assistance;Stand-pivot;Moderate assistance(sara stedy)           Functional mobility during ADLs: +2 for physical assistance;Moderate assistance(sara stedy) General ADL Comments: Pt with significant fear of falling. Due to cognitive deficits, requiring increased time and encouragment to participate in functional transfer.      Vision  Perception     Praxis      Pertinent Vitals/Pain Pain Assessment: Faces Faces Pain Scale: Hurts even more Pain Location: R hip with mobility Pain Descriptors / Indicators: Grimacing;Guarding Pain  Intervention(s): Monitored during session;Repositioned;Limited activity within patient's tolerance     Hand Dominance Left(Right hemipeligia)   Extremity/Trunk Assessment Upper Extremity Assessment Upper Extremity Assessment: RUE deficits/detail RUE Deficits / Details: Hemiparesis on RUE with contractures at elbow, wrist, and hand. No AROM. 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   General Comments  Daughter present throughout session and very involved. (Daughter a NT for IP)    Exercises     Shoulder Instructions      Home Living Family/patient expects to be discharged to:: Private residence Living Arrangements: Children Available Help at Discharge: Family;Personal care attendant;Available 24 hours/day Type of Home: House Home Access: Ramped entrance     Home Layout: One level         Bathroom Toilet: Standard Bathroom Accessibility: No(not accessible with w/c)   Home Equipment: Cane - quad;Wheelchair - power          Prior Functioning/Environment Level of Independence: Needs assistance  Gait / Transfers Assistance Needed: modified independent using QC for short distances. Primarily utilizes power w/c. Independent with transfers.  ADL's / Homemaking Assistance  Needed: PCA assists with dressing and bathing (sponge bathes at sink). Daughter does cooking and housekeeping.            OT Problem List: Decreased strength;Decreased range of motion;Decreased activity tolerance;Impaired balance (sitting and/or standing);Decreased cognition;Decreased safety awareness;Decreased knowledge of use of DME or AE;Decreased knowledge of precautions;Pain;Impaired UE functional use      OT Treatment/Interventions: Self-care/ADL training;Therapeutic exercise;Energy conservation;DME and/or AE instruction;Therapeutic activities;Patient/family education    OT Goals(Current goals can be found in the care plan section) Acute Rehab OT Goals Patient Stated Goal: home per daughter OT Goal Formulation: With patient/family Time For Goal Achievement: 10/08/17 Potential to Achieve Goals: Good ADL Goals Pt Will Perform Upper Body Dressing: with min assist;sitting;with caregiver independent in assisting Pt Will Perform Lower Body Dressing: with min assist;sit to/from stand;with caregiver independent in assisting Pt Will Transfer to Toilet: with min assist;bedside commode;stand pivot transfer Pt Will Perform Toileting - Clothing Manipulation and hygiene: with min assist;with caregiver independent in assisting;sit to/from stand  OT Frequency: Min 3X/week   Barriers to D/C:            Co-evaluation              AM-PAC PT "6 Clicks" Daily Activity     Outcome Measure Help from another person eating meals?: A Little Help from another person taking care of personal grooming?: A Little Help from another person toileting, which includes using toliet, bedpan, or urinal?: A Lot Help  from another person bathing (including washing, rinsing, drying)?: A Lot Help from another person to put on and taking off regular upper body clothing?: A Lot Help from another person to put on and taking off regular lower body clothing?: A Lot 6 Click Score: 14   End of Session Equipment  Utilized During Treatment: Gait belt;Other (comment)(sara stedy) Nurse Communication: Mobility status  Activity Tolerance: Patient tolerated treatment well;Patient limited by pain Patient left: in bed;with call bell/phone within reach;with bed alarm set;with family/visitor present  OT Visit Diagnosis: Unsteadiness on feet (R26.81);Other abnormalities of gait and mobility (R26.89);Muscle weakness (generalized) (M62.81);Other symptoms and signs involving cognitive function;Pain Pain - Right/Left: Right Pain - part of body: Hip                Time: 4709-6283 OT Time Calculation (min): 27 min Charges:  OT General Charges $OT Visit: 1 Visit OT Evaluation $OT Eval Moderate Complexity: 1 Mod OT Treatments $Self Care/Home Management : 8-22 mins G-Codes:     [place], OTR/L Acute Rehab Pager: [phone] Office: [place] 09/24/2017, 1:16 PM

## 2017-09-24 NOTE — Progress Notes (Signed)
Orthopedic Trauma Service Progress Note Weekend Coverage   Patient ID: Danielle Cruz MRN: [id number] DOB/AGE: [birth year] 62 y.o.  Subjective:  Doing well Answers questions by nodding and hand gestures  Nonverbal since stroke in 2010 which has affected her R side. Ambulates with cane at home PTA Pain tolerable  No acute issues   Review of Systems  Constitutional: Negative for chills and fever.  Respiratory: Negative for shortness of breath.   Cardiovascular: Negative for chest pain.  Gastrointestinal: Negative for abdominal pain and nausea.    Objective:   VITALS:   Vitals:   09/23/17 1624 09/23/17 1959 09/23/17 2353 09/24/17 0346  BP: (!) 123/55 (!) 143/61 (!) 117/52 106/62  Pulse: 83 78 83 84  Resp: 16     Temp:  98.2 F (36.8 C) 98.4 F (36.9 C) 98.1 F (36.7 C)  TempSrc:  Oral Oral Oral  SpO2: 98% 96% 100% 99%  Weight:      Height:        Estimated body mass index is 22.5 kg/m as calculated from the following:   Height as of this encounter: 5\' 8"  (1.727 m).   Weight as of this encounter: 67.1 kg (148 lb).   Intake/Output      06/07 0701 - 06/08 0700 06/08 0701 - 06/09 0700   P.O. 477 240   I.V. (mL/kg) 1373.8 (20.5)    IV Piggyback 100    Total Intake(mL/kg) 1950.8 (29.1) 240 (3.6)   Urine (mL/kg/hr) 1050 (0.7)    Blood 20    Total Output 1070    Net +880.8 +240          LABS  Results for orders placed or performed during the hospital encounter of 09/22/17 (from the past 24 hour(s))  Magnesium     Status: Abnormal   Collection Time: 09/23/17 12:11 PM  Result Value Ref Range   Magnesium 1.6 (L) 1.7 - 2.4 mg/dL  CBC     Status: Abnormal   Collection Time: 09/23/17  5:04 PM  Result Value Ref Range   WBC 7.6 4.0 - 10.5 K/uL   RBC 2.95 (L) 3.87 - 5.11 MIL/uL   Hemoglobin 9.4 (L) 12.0 - 15.0 g/dL   HCT 28.6 (L) 36.0 - 46.0 %   MCV 96.9 78.0 - 100.0 fL   MCH 31.9 26.0 - 34.0 pg   MCHC 32.9 30.0 - 36.0 g/dL   RDW 16.9 (H)  11.5 - 15.5 %   Platelets 163 150 - 400 K/uL  Creatinine, serum     Status: None   Collection Time: 09/23/17  5:04 PM  Result Value Ref Range   Creatinine, Ser 0.94 0.44 - 1.00 mg/dL   GFR calc non Af Amer >60 >60 mL/min   GFR calc Af Amer >60 >60 mL/min  CBC     Status: Abnormal   Collection Time: 09/24/17  8:12 AM  Result Value Ref Range   WBC 9.6 4.0 - 10.5 K/uL   RBC 2.46 (L) 3.87 - 5.11 MIL/uL   Hemoglobin 8.0 (L) 12.0 - 15.0 g/dL   HCT 24.4 (L) 36.0 - 46.0 %   MCV 99.2 78.0 - 100.0 fL   MCH 32.5 26.0 - 34.0 pg   MCHC 32.8 30.0 - 36.0 g/dL   RDW 16.9 (H) 11.5 - 15.5 %   Platelets 155 150 - 400 K/uL  Basic metabolic panel     Status: Abnormal   Collection Time: 09/24/17  8:12 AM  Result Value Ref Range  Sodium 137 135 - 145 mmol/L   Potassium 4.4 3.5 - 5.1 mmol/L   Chloride 104 101 - 111 mmol/L   CO2 18 (L) 22 - 32 mmol/L   Glucose, Bld 182 (H) 65 - 99 mg/dL   BUN 13 6 - 20 mg/dL   Creatinine, Ser 1.03 (H) 0.44 - 1.00 mg/dL   Calcium 7.8 (L) 8.9 - 10.3 mg/dL   GFR calc non Af Amer 57 (L) >60 mL/min   GFR calc Af Amer >60 >60 mL/min   Anion gap 15 5 - 15  Magnesium     Status: None   Collection Time: 09/24/17  8:12 AM  Result Value Ref Range   Magnesium 2.0 1.7 - 2.4 mg/dL     PHYSICAL EXAM:   Gen: sitting in bedside chair, NAD, appears well  Ext:       Right Lower Extremity   Dressings to R hip are c/d/i  Ext warm   Mod swelling to leg   No dct   Motor and sensory functions at baseline     Assessment/Plan: 1 Day Post-Op   Principal Problem:   Hip fracture (HCC) Active Problems:   Hyperlipemia   Essential hypertension   Hemiplegia, late effect of cerebrovascular disease (HCC)   Rheumatoid arthritis (HCC)   CAD in native artery   AKI (acute kidney injury) ([place])   Anti-infectives (From admission, onward)   Start     Dose/Rate Route Frequency Ordered Stop   09/23/17 2000  ceFAZolin (ANCEF) IVPB 2g/100 mL premix     2 g 200 mL/hr over 30 Minutes  Intravenous Every 6 hours 09/23/17 1659 09/24/17 0150   09/23/17 1333  ceFAZolin (ANCEF) 2-4 GM/100ML-% IVPB    Note to Pharmacy:  Beverly Gust   : cabinet override      09/23/17 1333 09/23/17 1438   09/23/17 1000  ceFAZolin (ANCEF) IVPB 2g/100 mL premix     2 g 200 mL/hr over 30 Minutes Intravenous To [facility] 09/23/17 0942 09/23/17 1438    .  POD/HD#:1  62 y/o black female with baseline R sided deficit due to stroke with acute R intertrochanteric femur fracture   - R hip fracture s/p IMN  WBAT   ROM as tolerated  Dressing changes as needed  Ice prn   PT/OT  TED hose   - Pain management:  Continue with current regimen   - ABL anemia/Hemodynamics  H/h drifting down  Cbc in am   May need transfusion with PRBCs   - Medical issues   Per primary   - DVT/PE prophylaxis:  lovenox 40 mg sq daily x 30 days   - Dispo:  PT/OT   Pt appears to have adequate support at home to be able to dc home    Likely ready Monday     Jari Pigg, PA-C [facility] [phone] Levi Aland (C) 09/24/2017, 11:40 AM

## 2017-09-24 NOTE — Evaluation (Signed)
Physical Therapy Evaluation Patient Details Name: Danielle Cruz MRN: [id number] DOB: 06/01/1955 Today's Date: 09/24/2017   History of Present Illness  Pt is a 62 y.o. female with medical history significant for hypertension, dyslipidemia, rheumatoid arthritis, tobacco abuse, CAD with prior stents, and prior CVA with right-sided hemiplegia who ambulates with a quad cane and uses a wheelchair at home.  She fell at home sustaining R hip fx and underwent IM nailing 09/23/17.     Clinical Impression  Pt admitted with above diagnosis. Pt currently with functional limitations due to the deficits listed below (see PT Problem List). PTA pt lived at home with her daughter. She primarily utilized power w/c for mobility but was mod I ambulation with QC short distances in the house. Her bathroom is not w/c accessible. On eval, she required mod assist bed mobility and mod assist sit to stand. Bariatric stedy utilized for bed to recliner transfer due to pt unable to take pivot steps. Pt very anxious/fearful regarding mobility. Pt will benefit from skilled PT to increase their independence and safety with mobility to allow discharge to the venue listed below.       Follow Up Recommendations Home health PT;Supervision/Assistance - 24 hour    Equipment Recommendations  3in1 (PT)    Recommendations for Other Services       Precautions / Restrictions Precautions Precautions: Fall Required Braces or Orthoses: Other Brace/Splint Other Brace/Splint: Pt wears R wrist splint due to deficits from previous CVA. Restrictions Weight Bearing Restrictions: Yes RLE Weight Bearing: Weight bearing as tolerated      Mobility  Bed Mobility Overal bed mobility: Needs Assistance Bed Mobility: Supine to Sit     Supine to sit: HOB elevated;Mod assist     General bed mobility comments: cues for sequencing, use of bed pad to scoot to EOB  Transfers Overall transfer level: Needs assistance   Transfers: Sit to/from  Stand Sit to Stand: Mod assist         General transfer comment: Pt very fearful of mobility. Encouragement and comforting needed. Able to perform sit to stand bedside but unable to take pivot steps for transfer. Therefore, stedy utilized to perform bed to recliner transfer.   Ambulation/Gait             General Gait Details: unable to progress due to pain and anxiety   Stairs            Wheelchair Mobility    Modified Rankin (Stroke Patients Only)       Balance Overall balance assessment: Needs assistance Sitting-balance support: No upper extremity supported;Feet supported Sitting balance-Leahy Scale: Good     Standing balance support: Bilateral upper extremity supported;During functional activity Standing balance-Leahy Scale: Poor Standing balance comment: reliant on UE support in stance                             Pertinent Vitals/Pain Pain Assessment: Faces Faces Pain Scale: Hurts even more Pain Location: R hip with mobility Pain Descriptors / Indicators: Grimacing;Guarding Pain Intervention(s): Monitored during session;Limited activity within patient's tolerance;Repositioned    Home Living Family/patient expects to be discharged to:: Private residence Living Arrangements: Children Available Help at Discharge: Family;Personal care attendant;Available 24 hours/day Type of Home: House Home Access: Ramped entrance     Home Layout: One level Home Equipment: Cane - quad;Wheelchair - power      Prior Function Level of Independence: Needs assistance   Gait / Transfers  Assistance Needed: modified independent using QC for short distances. Primarily utilizes power w/c. Independent with transfers.   ADL's / Homemaking Assistance Needed: PCA assists with dressing and bathing. Daughter does cooking and housekeeping.        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      General Comments      Exercises     Assessment/Plan    PT Assessment Patient needs continued PT services  PT Problem List Decreased strength;Decreased mobility;Decreased activity tolerance;Pain;Decreased balance;Decreased knowledge of use of DME       PT Treatment Interventions DME instruction;Therapeutic activities;Gait training;Therapeutic exercise;Patient/family education;Balance training;Functional mobility training    PT Goals (Current goals can be found in the Care Plan section)  Acute Rehab PT Goals Patient Stated Goal: home per daughter PT Goal Formulation: With patient/family Time For Goal Achievement: 10/08/17 Potential to Achieve Goals: Good    Frequency Min 3X/week   Barriers to discharge        Co-evaluation               AM-PAC PT "6 Clicks" Daily Activity  Outcome Measure Difficulty turning over in bed (including adjusting bedclothes, sheets and blankets)?: Unable Difficulty moving from lying on back to sitting on the side of the bed? : Unable Difficulty sitting down on and standing up from a chair with arms (e.g., wheelchair, bedside commode, etc,.)?: Unable Help needed moving to and from a bed to chair (including a wheelchair)?: A Lot Help needed walking in hospital room?: Total Help needed climbing 3-5 steps with a railing? : Total 6 Click Score: 7    End of  Session Equipment Utilized During Treatment: Gait belt Activity Tolerance: Patient tolerated treatment well Patient left: in chair;with call bell/phone within reach;with family/visitor present Nurse Communication: Mobility status;Need for lift equipment PT Visit Diagnosis: Other abnormalities of gait and mobility (R26.89);Pain Pain - Right/Left: Right Pain - part of body: Hip    Time: 0454-0981 PT Time Calculation (min) (ACUTE ONLY): 36 min   Charges:   PT Evaluation $PT Eval Moderate Complexity: 1 Mod PT Treatments $Therapeutic Activity: 8-22 mins   PT G Codes:        Lorrin Goodell, PT  Office # [phone] Pager [phone]   Lorriane Shire 09/24/2017, 10:40 AM

## 2017-09-24 NOTE — Progress Notes (Addendum)
PROGRESS NOTE    Danielle Cruz  YTK:[id number] DOB: [birth year] DOA: 09/22/2017 PCP: Fayrene Helper, MD    Brief Narrative:  Danielle Cruz is a 62 y.o. female with medical history significant for hypertension, dyslipidemia, rheumatoid arthritis, tobacco abuse, CAD with prior stents, and prior CVA with right-sided hemiplegia who ambulates with a quad cane and uses a wheelchair at home.  She had a fall earlier today as she was walking out of her bathroom.  According to the family member at the bedside her leg just got caught in the doorway and she fell onto her right hip and developed excruciating pain.  She states that the pain is worse with movement but is otherwise rated 0 out of 10 at rest.  She denies any radiation the pain.  The patient can understand everything that is being asked and nods to questions, but is otherwise nonverbal.   ED Course: Vital signs are stable and EKG demonstrates motion artifact, but she is currently in normal sinus rhythm.  Right hip x-ray with right intertrochanteric femoral neck fracture and varus angulation noted.  Laboratory data with some mild creatinine elevation of 1.15 with baseline of 0.81 noted previously.  She is also noted to have stable anemia.  No overt bleeding identified.  ED physician has spoken with Dr. Doreatha Martin of orthopedics who recommends transfer to [facility] for fixation.   Assessment & Plan:   Principal Problem:   Hip fracture ([place]) Active Problems:   Hyperlipemia   Essential hypertension   Hemiplegia, late effect of cerebrovascular disease (HCC)   Rheumatoid arthritis (HCC)   CAD in native artery   AKI (acute kidney injury) ([place])  Right intertrochanteric femoral neck fracture status post mechanical fall CT head done was negative for acute stroke, she has some chronic changes prior stroke. Discussed CT and case with neurology. CT finding appears to be chronic, no need to do MRI prior sx.  EKG no st elevation. Sinus. She denies chest pain  or dyspnea.  Underwent Intramedullary Nail femoral 6-07 PT per ortho.  DVT prophylaxis per ortho.   AKI; continue with IV fluids.  Stable.   Acute Blood loss anemia;  Post op.  Check anemia panel.  Start ferrous sulfate.  Cbc in am.   Hypomagnesemia;  Resolved.    Prior CVA with right-sided hemiparesis.  Continue with aspirin and statins.  CT with chronic worsening of prior stroke. She will need to follow up with her neurologist.   HTN; Continue with metoprolol.   CAD; statin, aspirin, metoprolol/  RA; Leflunomide.   EKG ? A flutter. monitor on telemetry. Will review EKG with cardiology    DVT prophylaxis: Heparin  Code Status:  Full code.  Family Communication: care discussed with sister.  Disposition Plan: OR today   Consultants:   Dr Percell Miller    Procedures:  none   Antimicrobials:  none   Subjective: Alert, aphasic. Able to communicate by sign.  Report pain right leg, denies worsening weakness is more pain.   Objective: Vitals:   09/23/17 1624 09/23/17 1959 09/23/17 2353 09/24/17 0346  BP: (!) 123/55 (!) 143/61 (!) 117/52 106/62  Pulse: 83 78 83 84  Resp: 16     Temp:  98.2 F (36.8 C) 98.4 F (36.9 C) 98.1 F (36.7 C)  TempSrc:  Oral Oral Oral  SpO2: 98% 96% 100% 99%  Weight:      Height:        Intake/Output Summary (Last 24 hours) at 09/24/2017  [place] filed at 09/24/2017 0900 Gross per 24 hour  Intake 1919.5 ml  Output 200 ml  Net 1719.5 ml   Filed Weights   09/22/17 1221  Weight: 67.1 kg (148 lb)    Examination:  General exam: NAD Respiratory system: CTA Cardiovascular system: S 1, S 2 RRR Gastrointestinal system: BS present, soft, nt Central nervous system: alert, aphasic, chronic right side hemiparesis.  Extremities: no edema Skin: no rash, no lesions.      Data Reviewed: I have personally reviewed following labs and imaging studies  CBC: Recent Labs  Lab 09/22/17 1412 09/23/17 1704 09/24/17 0812  WBC  10.5 7.6 9.6  NEUTROABS 8.7*  --   --   HGB 9.9* 9.4* 8.0*  HCT 30.5* 28.6* 24.4*  MCV 99.7 96.9 99.2  PLT 186 163 656   Basic Metabolic Panel: Recent Labs  Lab 09/22/17 1412 09/23/17 1211 09/23/17 1704 09/24/17 0812  NA 137  --   --  137  K 3.8  --   --  4.4  CL 103  --   --  104  CO2 25  --   --  18*  GLUCOSE 101*  --   --  182*  BUN 10  --   --  13  CREATININE 1.15*  --  0.94 1.03*  CALCIUM 8.8*  --   --  7.8*  MG  --  1.6*  --  2.0   GFR: Estimated Creatinine Clearance: 57.1 mL/min (A) (by C-G formula based on SCr of 1.03 mg/dL (H)). Liver Function Tests: Recent Labs  Lab 09/22/17 1412  AST 33  ALT 32  ALKPHOS 74  BILITOT 0.6  PROT 6.7  ALBUMIN 3.2*   No results for input(s): LIPASE, AMYLASE in the last 168 hours. No results for input(s): AMMONIA in the last 168 hours. Coagulation Profile: No results for input(s): INR, PROTIME in the last 168 hours. Cardiac Enzymes: No results for input(s): CKTOTAL, CKMB, CKMBINDEX, TROPONINI in the last 168 hours. BNP (last 3 results) No results for input(s): PROBNP in the last 8760 hours. HbA1C: No results for input(s): HGBA1C in the last 72 hours. CBG: No results for input(s): GLUCAP in the last 168 hours. Lipid Profile: No results for input(s): CHOL, HDL, LDLCALC, TRIG, CHOLHDL, LDLDIRECT in the last 72 hours. Thyroid Function Tests: No results for input(s): TSH, T4TOTAL, FREET4, T3FREE, THYROIDAB in the last 72 hours. Anemia Panel: No results for input(s): VITAMINB12, FOLATE, FERRITIN, TIBC, IRON, RETICCTPCT in the last 72 hours. Sepsis Labs: No results for input(s): PROCALCITON, LATICACIDVEN in the last 168 hours.  Recent Results (from the past 240 hour(s))  Surgical pcr screen     Status: None   Collection Time: 09/23/17  9:57 AM  Result Value Ref Range Status   MRSA, PCR NEGATIVE NEGATIVE Final   Staphylococcus aureus NEGATIVE NEGATIVE Final    Comment: (NOTE) The Xpert SA Assay (FDA approved for NASAL  specimens in patients 47 years of age and older), is one component of a comprehensive surveillance program. It is not intended to diagnose infection nor to guide or monitor treatment. Performed at [facility], [place] [address] [place], [place] [address]          Radiology Studies: Ct Head Wo Contrast  Result Date: 09/23/2017 CLINICAL DATA:  62 year old female admitted after a fall. Increased headache. EXAM: CT HEAD WITHOUT CONTRAST TECHNIQUE: Contiguous axial images were obtained from the base of the skull through the vertex without intravenous contrast.  COMPARISON:  Head CT without contrast 04/28/2009, brain MRI 03/28/2009. FINDINGS: Brain: Chronic large left MCA territory infarct with encephalomalacia and gliosis. There is a small new area of encephalomalacia compared to the 2011 CT along the para landed cortex seen on series 3, image 22 today. Increased regional white matter gliosis at that level as well. However, these have a chronic appearance. No associated hemorrhage or mass effect. Stable mild ex vacuo enlargement of the left lateral ventricle. No acute cortically based infarct identified. Right hemisphere gray-white matter differentiation remains normal. Vascular: Calcified atherosclerosis at the skull base. No suspicious intracranial vascular hyperdensity. Skull: Stable.  No acute osseous abnormality identified. Sinuses/Orbits: Visualized paranasal sinuses and mastoids are stable and well pneumatized. Other: Visualized orbits and scalp soft tissues are within normal limits. IMPRESSION: 1. Some progression of the chronic left MCA infarct since the 2011 CT, but with a chronic appearance. 2. No acute cortically based infarct, acute intracranial hemorrhage, or other acute intracranial abnormality identified. 3. These results were communicated to Dr. Niel Hummer at 1322 hours on 6/7/2019by text page via the [facility] messaging system. Electronically Signed   By: Genevie Ann M.D.   On:  09/23/2017 13:27   Dg C-arm 1-60 Min  Result Date: 09/23/2017 CLINICAL DATA:  Right intramedullary nail placement for a right femoral intertrochanteric fracture. EXAM: RIGHT FEMUR 2 VIEWS; DG C-ARM 61-120 MIN COMPARISON:  Yesterday. FINDINGS: Four C-arm views of the right hip demonstrate placement of an intramedullary rod and compression screw bridging the previously demonstrated right intertrochanteric fracture. Normal alignment following hardware placement. No additional fractures are visualized. IMPRESSION: Hardware fixation of the previously demonstrated right intertrochanteric fracture. Electronically Signed   By: Claudie Revering M.D.   On: 09/23/2017 15:45   Dg Femur, Min 2 Views Right  Result Date: 09/23/2017 CLINICAL DATA:  Right intramedullary nail placement for a right femoral intertrochanteric fracture. EXAM: RIGHT FEMUR 2 VIEWS; DG C-ARM 61-120 MIN COMPARISON:  Yesterday. FINDINGS: Four C-arm views of the right hip demonstrate placement of an intramedullary rod and compression screw bridging the previously demonstrated right intertrochanteric fracture. Normal alignment following hardware placement. No additional fractures are visualized. IMPRESSION: Hardware fixation of the previously demonstrated right intertrochanteric fracture. Electronically Signed   By: Claudie Revering M.D.   On: 09/23/2017 15:45        Scheduled Meds: . aspirin EC  81 mg Oral Daily  . atorvastatin  80 mg Oral q1800  . baclofen  10 mg Oral BID  . docusate sodium  100 mg Oral BID  . enoxaparin (LOVENOX) injection  40 mg Subcutaneous Q24H  . ferrous sulfate  325 mg Oral BID WC  . fluticasone  1 spray Each Nare Daily  . folic acid  1 mg Oral Daily  . leflunomide  20 mg Oral Daily  . metoprolol succinate  25 mg Oral Daily  . montelukast  10 mg Oral QHS  . multivitamin with minerals  1 tablet Oral Daily  . oxybutynin  2.5 mg Oral BID   Continuous Infusions: . sodium chloride 75 mL/hr at 09/24/17 0914  . lactated  ringers Stopped (09/23/17 1534)     LOS: 2 days    Time spent: 35 minutes.     Elmarie Shiley, MD [facility] Pager [phone]  If 7PM-7AM, please contact night-coverage [email or web address] Password [email or web address] 09/24/2017, 3:30 PM

## 2017-09-24 NOTE — Plan of Care (Signed)
  Problem: Clinical Measurements: Goal: Ability to maintain clinical measurements within normal limits will improve Outcome: Progressing   Problem: Activity: Goal: Risk for activity intolerance will decrease Outcome: Progressing   Problem: Coping: Goal: Level of anxiety will decrease Outcome: Progressing   Problem: Pain Managment: Goal: General experience of comfort will improve Outcome: Progressing   

## 2017-09-25 ENCOUNTER — Other Ambulatory Visit: Payer: Self-pay | Admitting: Family Medicine

## 2017-09-25 ENCOUNTER — Encounter (HOSPITAL_COMMUNITY): Payer: Medicare Other

## 2017-09-25 DIAGNOSIS — E785 Hyperlipidemia, unspecified: Secondary | ICD-10-CM

## 2017-09-25 DIAGNOSIS — S72141A Displaced intertrochanteric fracture of right femur, initial encounter for closed fracture: Secondary | ICD-10-CM | POA: Diagnosis present

## 2017-09-25 DIAGNOSIS — I2510 Atherosclerotic heart disease of native coronary artery without angina pectoris: Secondary | ICD-10-CM

## 2017-09-25 DIAGNOSIS — I69051 Hemiplegia and hemiparesis following nontraumatic subarachnoid hemorrhage affecting right dominant side: Secondary | ICD-10-CM

## 2017-09-25 DIAGNOSIS — Z1231 Encounter for screening mammogram for malignant neoplasm of breast: Secondary | ICD-10-CM

## 2017-09-25 DIAGNOSIS — I10 Essential (primary) hypertension: Secondary | ICD-10-CM

## 2017-09-25 LAB — BASIC METABOLIC PANEL
Anion gap: 6 (ref 5–15)
Anion gap: 9 (ref 5–15)
BUN: 13 mg/dL (ref 6–20)
BUN: 17 mg/dL (ref 6–20)
CO2: 26 mmol/L (ref 22–32)
CO2: 25 mmol/L (ref 22–32)
Calcium: 8.2 mg/dL — ABNORMAL LOW (ref 8.9–10.3)
Calcium: 8 mg/dL — ABNORMAL LOW (ref 8.9–10.3)
Chloride: 109 mmol/L (ref 101–111)
Chloride: 103 mmol/L (ref 101–111)
Creatinine, Ser: 0.84 mg/dL (ref 0.44–1.00)
Creatinine, Ser: 0.97 mg/dL (ref 0.44–1.00)
GFR calc Af Amer: >60 mL/min (ref >60)
GFR calc Af Amer: >60 mL/min (ref >60)
GFR calc non Af Amer: >60 mL/min (ref >60)
GFR calc non Af Amer: >60 mL/min (ref >60)
Glucose, Bld: 110 mg/dL — ABNORMAL HIGH (ref 65–99)
Glucose, Bld: 85 mg/dL (ref 65–99)
Potassium: 5.4 mmol/L — ABNORMAL HIGH (ref 3.5–5.1)
Potassium: 3.8 mmol/L (ref 3.5–5.1)
Sodium: 141 mmol/L (ref 135–145)
Sodium: 137 mmol/L (ref 135–145)

## 2017-09-25 LAB — CBC
HCT: 22.1 % — ABNORMAL LOW (ref 36.0–46.0)
Hemoglobin: 6.8 g/dL — CL (ref 12.0–15.0)
MCH: 31.3 pg (ref 26.0–34.0)
MCHC: 30.8 g/dL (ref 30.0–36.0)
MCV: 101.8 fL — ABNORMAL HIGH (ref 78.0–100.0)
Platelets: 166 10*3/uL (ref 150–400)
RBC: 2.17 MIL/uL — ABNORMAL LOW (ref 3.87–5.11)
RDW: 17.2 % — ABNORMAL HIGH (ref 11.5–15.5)
WBC: 12.3 10*3/uL — ABNORMAL HIGH (ref 4.0–10.5)

## 2017-09-25 LAB — ABO/RH: ABO/RH(D): O POS

## 2017-09-25 LAB — RETICULOCYTES
RBC.: 2.17 MIL/uL — ABNORMAL LOW (ref 3.87–5.11)
Retic Count, Absolute: 47.7 10*3/uL (ref 19.0–186.0)
Retic Ct Pct: 2.2 % (ref 0.4–3.1)

## 2017-09-25 LAB — IRON AND TIBC
Iron: 54 ug/dL (ref 28–170)
Saturation Ratios: 32 % — ABNORMAL HIGH (ref 10.4–31.8)
TIBC: 169 ug/dL — ABNORMAL LOW (ref 250–450)
UIBC: 115 ug/dL

## 2017-09-25 LAB — PREPARE RBC (CROSSMATCH)

## 2017-09-25 LAB — VITAMIN B12: Vitamin B-12: 442 pg/mL (ref 180–914)

## 2017-09-25 LAB — FERRITIN: Ferritin: 636 ng/mL — ABNORMAL HIGH (ref 11–307)

## 2017-09-25 MED ORDER — FUROSEMIDE 10 MG/ML IJ SOLN
20.0000 mg | Freq: Once | INTRAMUSCULAR | Status: AC
  Administered 2017-09-25: 20 mg via INTRAVENOUS
  Filled 2017-09-25: qty 2

## 2017-09-25 MED ORDER — POLYETHYLENE GLYCOL 3350 17 G PO PACK
17.0000 g | PACK | Freq: Two times a day (BID) | ORAL | Status: DC
  Administered 2017-09-25 – 2017-09-26 (×3): 17 g via ORAL
  Filled 2017-09-25 (×3): qty 1

## 2017-09-25 MED ORDER — ACETAMINOPHEN 325 MG PO TABS
650.0000 mg | ORAL_TABLET | Freq: Once | ORAL | Status: AC
  Administered 2017-09-25: 650 mg via ORAL
  Filled 2017-09-25: qty 2

## 2017-09-25 MED ORDER — SENNA 8.6 MG PO TABS
1.0000 | ORAL_TABLET | Freq: Two times a day (BID) | ORAL | Status: DC
  Administered 2017-09-25 – 2017-09-26 (×3): 8.6 mg via ORAL
  Filled 2017-09-25 (×3): qty 1

## 2017-09-25 MED ORDER — SODIUM CHLORIDE 0.9 % IV SOLN: Freq: Once | INTRAVENOUS | Status: DC

## 2017-09-25 MED ORDER — DIPHENHYDRAMINE HCL 25 MG PO CAPS
25.0000 mg | ORAL_CAPSULE | Freq: Once | ORAL | Status: AC
  Administered 2017-09-25: 25 mg via ORAL
  Filled 2017-09-25: qty 1

## 2017-09-25 NOTE — Progress Notes (Signed)
PT Cancellation Note  Patient Details Name: Danielle Cruz MRN: [id number] DOB: 04/15/1956   Cancelled Treatment:    Reason Eval/Treat Not Completed: Medical issues which prohibited therapy. Hgb 6.8. Pt scheduled to receive 2 units PRBC.    Lorriane Shire 09/25/2017, 9:05 AM

## 2017-09-25 NOTE — Progress Notes (Signed)
CRITICAL VALUE ALERT  Critical Value:  6.8  Date & Time Notied:  09/25/17 0600   Provider Notified: Dr. Maudie Mercury   Orders Received/Actions taken: see managed orders.  Type and screen has to be drawn before blood is given RN of day shift is aware.

## 2017-09-25 NOTE — Progress Notes (Signed)
Orthopedic Trauma Service Progress Note weekend coverage   Patient ID: Danielle Cruz MRN: [id number] DOB/AGE: [birth year] 62 y.o.  Subjective:  Pt getting 2 units of PRBCs this am Doing well otherwise  No other complaints    ROS As above   Objective:   VITALS:   Vitals:   09/24/17 0346 09/24/17 1539 09/24/17 1931 09/25/17 0444  BP: 106/62 (!) 171/71 (!) 156/61 (!) 151/64  Pulse: 84 84 87 72  Resp:  16    Temp: 98.1 F (36.7 C) 98.5 F (36.9 C) 98.2 F (36.8 C) 97.9 F (36.6 C)  TempSrc: Oral Oral Oral Oral  SpO2: 99% 98% 100% 100%  Weight:      Height:        Estimated body mass index is 22.5 kg/m as calculated from the following:   Height as of this encounter: 5\' 8"  (1.727 m).   Weight as of this encounter: 67.1 kg (148 lb).   Intake/Output      06/08 0701 - 06/09 0700 06/09 0701 - 06/10 0700   P.O. 960    I.V. (mL/kg)     IV Piggyback     Total Intake(mL/kg) 960 (14.3)    Urine (mL/kg/hr) 1950 (1.2)    Blood     Total Output 1950    Net -990           LABS  Results for orders placed or performed during the hospital encounter of 09/22/17 (from the past 24 hour(s))  CBC     Status: Abnormal   Collection Time: 09/25/17  3:53 AM  Result Value Ref Range   WBC 12.3 (H) 4.0 - 10.5 K/uL   RBC 2.17 (L) 3.87 - 5.11 MIL/uL   Hemoglobin 6.8 (LL) 12.0 - 15.0 g/dL   HCT 22.1 (L) 36.0 - 46.0 %   MCV 101.8 (H) 78.0 - 100.0 fL   MCH 31.3 26.0 - 34.0 pg   MCHC 30.8 30.0 - 36.0 g/dL   RDW 17.2 (H) 11.5 - 15.5 %   Platelets 166 150 - 400 K/uL  Vitamin B12     Status: None   Collection Time: 09/25/17  3:53 AM  Result Value Ref Range   Vitamin B-12 442 180 - 914 pg/mL  Iron and TIBC     Status: Abnormal   Collection Time: 09/25/17  3:53 AM  Result Value Ref Range   Iron 54 28 - 170 ug/dL   TIBC 169 (L) 250 - 450 ug/dL   Saturation Ratios 32 (H) 10.4 - 31.8 %   UIBC 115 ug/dL  Ferritin     Status: Abnormal   Collection Time: 09/25/17   3:53 AM  Result Value Ref Range   Ferritin 636 (H) 11 - 307 ng/mL  Reticulocytes     Status: Abnormal   Collection Time: 09/25/17  3:53 AM  Result Value Ref Range   Retic Ct Pct 2.2 0.4 - 3.1 %   RBC. 2.17 (L) 3.87 - 5.11 MIL/uL   Retic Count, Absolute 47.7 19.0 - 186.0 K/uL  Basic metabolic panel     Status: Abnormal   Collection Time: 09/25/17  3:53 AM  Result Value Ref Range   Sodium 141 135 - 145 mmol/L   Potassium 5.4 (H) 3.5 - 5.1 mmol/L   Chloride 109 101 - 111 mmol/L   CO2 26 22 - 32 mmol/L   Glucose, Bld 110 (H) 65 - 99 mg/dL   BUN 13 6 - 20 mg/dL   Creatinine, Ser  0.84 0.44 - 1.00 mg/dL   Calcium 8.2 (L) 8.9 - 10.3 mg/dL   GFR calc non Af Amer >60 >60 mL/min   GFR calc Af Amer >60 >60 mL/min   Anion gap 6 5 - 15  Prepare RBC     Status: None   Collection Time: 09/25/17  7:51 AM  Result Value Ref Range   Order Confirmation      ORDER PROCESSED BY BLOOD BANK Performed at [facility], [place] [address] [place], [place] [address]   Type and screen [place]     Status: None (Preliminary result)   Collection Time: 09/25/17  7:51 AM  Result Value Ref Range   ABO/RH(D) O POS    Antibody Screen NEG    Sample Expiration 09/28/2017    Unit Number [id number]    Blood Component Type RED CELLS,LR    Unit division 00    Status of Unit ALLOCATED    Transfusion Status OK TO TRANSFUSE    Crossmatch Result Compatible    Unit Number [id number]    Blood Component Type RED CELLS,LR    Unit division 00    Status of Unit ISSUED    Transfusion Status OK TO TRANSFUSE    Crossmatch Result      Compatible Performed at [facility], [place] [address] [place], [place] [address]   ABO/Rh     Status: None   Collection Time: 09/25/17  7:51 AM  Result Value Ref Range   ABO/RH(D)      O POS Performed at [place] [address] [place], [place] [address]      PHYSICAL EXAM:   Gen: sitting up in bed, NAD, appears well  Ext:        Right Lower Extremity              Dressings to R hip are c/d/i             Ext warm              Mod swelling to leg              No dct              Motor and sensory functions at baseline (dysfunction due to previous stroke)                Assessment/Plan: 2 Days Post-Op   Principal Problem:   Hip fracture ([place]) Active Problems:   Hyperlipemia   Essential hypertension   Hemiplegia, late effect of cerebrovascular disease ([place])   Rheumatoid arthritis ([place])   CAD in native artery   AKI (acute kidney injury) ([place])   Anti-infectives (From admission, onward)   Start     Dose/Rate Route Frequency Ordered Stop   09/23/17 2000  ceFAZolin (ANCEF) IVPB 2g/100 mL premix     2 g 200 mL/hr over 30 Minutes Intravenous Every 6 hours 09/23/17 1659 09/24/17 0150   09/23/17 1333  ceFAZolin (ANCEF) 2-4 GM/100ML-% IVPB    Note to Pharmacy:  Beverly Gust   : cabinet override      09/23/17 1333 09/23/17 1438   09/23/17 1000  ceFAZolin (ANCEF) IVPB 2g/100 mL premix     2 g 200 mL/hr over 30 Minutes Intravenous To [facility] 09/23/17 0942 09/23/17 1438    .  POD/HD#: 2  62 y/o black female with baseline R sided deficit due to stroke with acute R intertrochanteric femur  fracture    - R hip fracture s/p IMN             WBAT              ROM as tolerated             Dressing changes as needed             Ice prn              PT/OT             TED hose    - Pain management:             Continue with current regimen    - ABL anemia/Hemodynamics             getting 2 units of PRBCs today   Cbc in am    - Medical issues              Per primary    - DVT/PE prophylaxis:             lovenox 40 mg sq daily x 30 days    - Dispo:             PT/OT              Pt appears to have adequate support at home to be able to dc home               possibly tomorrow  Operative team to resume care tomorrow     Follow up with Dr. Percell Miller in 10-14 days   Jari Pigg,  PA-C [facility] [phone] ([phone] Levi Aland (C) 09/25/2017, 9:56 AM

## 2017-09-25 NOTE — Progress Notes (Signed)
PROGRESS NOTE    Danielle Cruz  LFY:[id number] DOB: 1955-05-02 DOA: 09/22/2017 PCP: Fayrene Helper, MD    Brief Narrative:  Danielle Cruz is a 62 y.o. female with medical history significant for hypertension, dyslipidemia, rheumatoid arthritis, tobacco abuse, CAD with prior stents, and prior CVA with right-sided hemiplegia who ambulates with a quad cane and uses a wheelchair at home.  She had a fall earlier today as she was walking out of her bathroom.  According to the family member at the bedside her leg just got caught in the doorway and she fell onto her right hip and developed excruciating pain.  She states that the pain is worse with movement but is otherwise rated 0 out of 10 at rest.  She denies any radiation the pain.  The patient can understand everything that is being asked and nods to questions, but is otherwise nonverbal.   ED Course: Vital signs are stable and EKG demonstrates motion artifact, but she is currently in normal sinus rhythm.  Right hip x-ray with right intertrochanteric femoral neck fracture and varus angulation noted.  Laboratory data with some mild creatinine elevation of 1.15 with baseline of 0.81 noted previously.  She is also noted to have stable anemia.  No overt bleeding identified.  ED physician has spoken with Dr. Doreatha Martin of orthopedics who recommends transfer to [facility] for fixation.   Assessment & Plan:   Principal Problem:   Intertrochanteric fracture of right hip (HCC) Active Problems:   Hyperlipemia   Essential hypertension   Hemiplegia, late effect of cerebrovascular disease (HCC)   Rheumatoid arthritis (HCC)   CAD in native artery   Hip fracture (HCC)   AKI (acute kidney injury) ([place])  Right intertrochanteric femoral neck fracture status post mechanical fall CT head done was negative for acute stroke, she has some chronic changes prior stroke. Discussed CT and case with neurology. CT finding appears to be chronic, no need to do MRI prior sx.    EKG no st elevation. Sinus. She denies chest pain or dyspnea.  Underwent Intramedullary Nail femoral 6-07 PT per ortho.  DVT prophylaxis per ortho.   AKI; continue with IV fluids.  Stable.   Acute Blood loss anemia;  Post op.  Check anemia panel.  Started  ferrous sulfate.  Getting 2 units PRBC  Hypomagnesemia;  Resolved.    Prior CVA with right-sided hemiparesis.  Continue with aspirin and statins.  CT with chronic worsening of prior stroke. She will need to follow up with her neurologist.   HTN; Continue with metoprolol.   CAD; statin, aspirin, metoprolol/  RA; Leflunomide.   EKG ? A flutter. monitor on telemetry. Reviewed EKG with cardiologist, no evidence of A flutter.   Mild elevation of K; she will get lasix. Repeat b-met this afternoon.    DVT prophylaxis: Heparin  Code Status:  Full code.  Family Communication: care discussed with sister.  Disposition Plan: OR today   Consultants:   Dr Percell Miller    Procedures:  none   Antimicrobials:  none   Subjective: Aphasic.  Complaints of hip pain.    Objective: Vitals:   09/25/17 1020 09/25/17 1419 09/25/17 1448 09/25/17 1521  BP: (!) 148/74 (!) 174/74 (!) 161/74 (!) 173/70  Pulse: 74 72 74 72  Resp: '16 18 16 16  ' Temp: 98.3 F (36.8 C) 98.6 F (37 C) 98.2 F (36.8 C) 98.4 F (36.9 C)  TempSrc: Oral Oral Oral Oral  SpO2: 100% 100% 100% 100%  Weight:      Height:        Intake/Output Summary (Last 24 hours) at 09/25/2017 1629 Last data filed at 09/25/2017 1300 Gross per 24 hour  Intake 720 ml  Output 1450 ml  Net -730 ml   Filed Weights   09/22/17 1221  Weight: 67.1 kg (148 lb)    Examination:  General exam: NAD Respiratory system: CTA Cardiovascular system: S 1, S 2 RRR Gastrointestinal system: BS present, soft, nt Central nervous system: alert, aphasic, chronic right side hemiparesis.  Extremities: No edema.  Skin: No rash.      Data Reviewed: I have personally reviewed  following labs and imaging studies  CBC: Recent Labs  Lab 09/22/17 1412 09/23/17 1704 09/24/17 0812 09/25/17 0353  WBC 10.5 7.6 9.6 12.3*  NEUTROABS 8.7*  --   --   --   HGB 9.9* 9.4* 8.0* 6.8*  HCT 30.5* 28.6* 24.4* 22.1*  MCV 99.7 96.9 99.2 101.8*  PLT 186 163 155 712   Basic Metabolic Panel: Recent Labs  Lab 09/22/17 1412 09/23/17 1211 09/23/17 1704 09/24/17 0812 09/25/17 0353  NA 137  --   --  137 141  K 3.8  --   --  4.4 5.4*  CL 103  --   --  104 109  CO2 25  --   --  18* 26  GLUCOSE 101*  --   --  182* 110*  BUN 10  --   --  13 13  CREATININE 1.15*  --  0.94 1.03* 0.84  CALCIUM 8.8*  --   --  7.8* 8.2*  MG  --  1.6*  --  2.0  --    GFR: Estimated Creatinine Clearance: 70 mL/min (by C-G formula based on SCr of 0.84 mg/dL). Liver Function Tests: Recent Labs  Lab 09/22/17 1412  AST 33  ALT 32  ALKPHOS 74  BILITOT 0.6  PROT 6.7  ALBUMIN 3.2*   No results for input(s): LIPASE, AMYLASE in the last 168 hours. No results for input(s): AMMONIA in the last 168 hours. Coagulation Profile: No results for input(s): INR, PROTIME in the last 168 hours. Cardiac Enzymes: No results for input(s): CKTOTAL, CKMB, CKMBINDEX, TROPONINI in the last 168 hours. BNP (last 3 results) No results for input(s): PROBNP in the last 8760 hours. HbA1C: No results for input(s): HGBA1C in the last 72 hours. CBG: No results for input(s): GLUCAP in the last 168 hours. Lipid Profile: No results for input(s): CHOL, HDL, LDLCALC, TRIG, CHOLHDL, LDLDIRECT in the last 72 hours. Thyroid Function Tests: No results for input(s): TSH, T4TOTAL, FREET4, T3FREE, THYROIDAB in the last 72 hours. Anemia Panel: Recent Labs    09/25/17 0353  VITAMINB12 442  FERRITIN 636*  TIBC 169*  IRON 54  RETICCTPCT 2.2   Sepsis Labs: No results for input(s): PROCALCITON, LATICACIDVEN in the last 168 hours.  Recent Results (from the past 240 hour(s))  Surgical pcr screen     Status: None   Collection  Time: 09/23/17  9:57 AM  Result Value Ref Range Status   MRSA, PCR NEGATIVE NEGATIVE Final   Staphylococcus aureus NEGATIVE NEGATIVE Final    Comment: (NOTE) The Xpert SA Assay (FDA approved for NASAL specimens in patients 46 years of age and older), is one component of a comprehensive surveillance program. It is not intended to diagnose infection nor to guide or monitor treatment. Performed at [facility], [place] [address] [place], [place] [address]  Radiology Studies: No results found.      Scheduled Meds: . aspirin EC  81 mg Oral Daily  . atorvastatin  80 mg Oral q1800  . baclofen  10 mg Oral BID  . docusate sodium  100 mg Oral BID  . enoxaparin (LOVENOX) injection  40 mg Subcutaneous Q24H  . ferrous sulfate  325 mg Oral BID WC  . fluticasone  1 spray Each Nare Daily  . folic acid  1 mg Oral Daily  . leflunomide  20 mg Oral Daily  . metoprolol succinate  25 mg Oral Daily  . montelukast  10 mg Oral QHS  . multivitamin with minerals  1 tablet Oral Daily  . oxybutynin  2.5 mg Oral BID  . polyethylene glycol  17 g Oral BID  . senna  1 tablet Oral BID   Continuous Infusions: . sodium chloride    . lactated ringers Stopped (09/23/17 1534)     LOS: 3 days    Time spent: 35 minutes.     Elmarie Shiley, MD [facility] Pager [phone]  If 7PM-7AM, please contact night-coverage [email or web address] Password [facility] 09/25/2017, 4:29 PM

## 2017-09-26 ENCOUNTER — Encounter (HOSPITAL_COMMUNITY): Payer: Self-pay | Admitting: Orthopedic Surgery

## 2017-09-26 ENCOUNTER — Other Ambulatory Visit: Payer: Self-pay | Admitting: *Deleted

## 2017-09-26 ENCOUNTER — Inpatient Hospital Stay (HOSPITAL_COMMUNITY): Payer: Medicare Other

## 2017-09-26 DIAGNOSIS — I10 Essential (primary) hypertension: Secondary | ICD-10-CM

## 2017-09-26 DIAGNOSIS — N179 Acute kidney failure, unspecified: Secondary | ICD-10-CM

## 2017-09-26 DIAGNOSIS — R609 Edema, unspecified: Secondary | ICD-10-CM

## 2017-09-26 LAB — TYPE AND SCREEN
ABO/RH(D): O POS
Antibody Screen: NEGATIVE
Unit division: 0
Unit division: 0

## 2017-09-26 LAB — CBC
HCT: 34.5 % — ABNORMAL LOW (ref 36.0–46.0)
Hemoglobin: 11.4 g/dL — ABNORMAL LOW (ref 12.0–15.0)
MCH: 31.5 pg (ref 26.0–34.0)
MCHC: 33 g/dL (ref 30.0–36.0)
MCV: 95.3 fL (ref 78.0–100.0)
Platelets: 188 10*3/uL (ref 150–400)
RBC: 3.62 MIL/uL — ABNORMAL LOW (ref 3.87–5.11)
RDW: 17.9 % — ABNORMAL HIGH (ref 11.5–15.5)
WBC: 10.3 10*3/uL (ref 4.0–10.5)

## 2017-09-26 LAB — BPAM RBC
Blood Product Expiration Date: 201906252359
Blood Product Expiration Date: 201906252359
ISSUE DATE / TIME: 201906090936
ISSUE DATE / TIME: 201906091433
Unit Type and Rh: 5100
Unit Type and Rh: 5100

## 2017-09-26 MED ORDER — DOCUSATE SODIUM 100 MG PO CAPS: 100.0000 mg | ORAL_CAPSULE | Freq: Two times a day (BID) | ORAL | 0 refills | Status: DC

## 2017-09-26 MED ORDER — POLYETHYLENE GLYCOL 3350 17 G PO PACK: 17.0000 g | PACK | Freq: Two times a day (BID) | ORAL | 0 refills | Status: DC

## 2017-09-26 MED ORDER — SENNA 8.6 MG PO TABS: 1.0000 | ORAL_TABLET | Freq: Two times a day (BID) | ORAL | 0 refills | Status: DC

## 2017-09-26 MED ORDER — HYDROCHLOROTHIAZIDE 25 MG PO TABS
25.0000 mg | ORAL_TABLET | Freq: Every day | ORAL | Status: DC
  Administered 2017-09-26: 25 mg via ORAL
  Filled 2017-09-26: qty 1

## 2017-09-26 MED ORDER — FERROUS SULFATE 325 (65 FE) MG PO TABS: 325.0000 mg | ORAL_TABLET | Freq: Two times a day (BID) | ORAL | 3 refills | Status: DC

## 2017-09-26 MED ORDER — METOPROLOL SUCCINATE ER 25 MG PO TB24: 25.0000 mg | ORAL_TABLET | Freq: Every day | ORAL | 3 refills | Status: DC

## 2017-09-26 MED ORDER — SPIRONOLACTONE 25 MG PO TABS
25.0000 mg | ORAL_TABLET | Freq: Every day | ORAL | Status: DC
  Administered 2017-09-26: 25 mg via ORAL
  Filled 2017-09-26: qty 1

## 2017-09-26 MED ORDER — BISACODYL 10 MG RE SUPP: 10.0000 mg | Freq: Every day | RECTAL | 0 refills | Status: DC | PRN

## 2017-09-26 MED ORDER — BISACODYL 10 MG RE SUPP
10.0000 mg | Freq: Once | RECTAL | Status: AC
  Administered 2017-09-26: 10 mg via RECTAL
  Filled 2017-09-26: qty 1

## 2017-09-26 MED ORDER — ENOXAPARIN (LOVENOX) PATIENT EDUCATION KIT
PACK | Freq: Once | Status: DC
  Filled 2017-09-26: qty 1

## 2017-09-26 NOTE — Progress Notes (Signed)
Spoke with MD and made him aware of patient's high BP reading and that hydralazine was given. MD stated to recheck again and call back if BP still reading high. BP @ 0543 151/65. Will continue to monitor.

## 2017-09-26 NOTE — Discharge Summary (Signed)
Physician Discharge Summary  Danielle Cruz UUV:[id number] DOB: 01/18/1956 DOA: 09/22/2017  PCP: Fayrene Helper, MD  Admit date: 09/22/2017 Discharge date: 09/26/2017  Admitted From: Home  Disposition: Home   Recommendations for Outpatient Follow-up:  1. Follow up with PCP in 1-2 weeks 2. Please obtain BMP/CBC in one week 3. Follow up with Ortho post sx. 4. Follow up with neurology for chronic worsening CVA 5.   Home Health: yes   Discharge Condition: stable.  CODE STATUS: Full code.  Diet recommendation: Heart Healthy  Brief/Interim Summary:  Brief Narrative:  Danielle C Brownis a 62 y.o.femalewith medical history significant forhypertension, dyslipidemia, rheumatoid arthritis, tobacco abuse, CAD with prior stents,andprior CVA with right-sided hemiplegia who ambulates with a quad cane and uses a wheelchair at home. She had a fall earlier today as she was walking out of her bathroom. According to the family member at the bedside her leg just got caught in the doorway and she fell onto her right hip and developed excruciating pain. She states that the pain is worse with movement but is otherwise rated 0 out of 10 at rest. She denies any radiation the pain.The patient can understand everything that is being asked and nods to questions, but is otherwise nonverbal.  ED Course:Vital signs are stable and EKG demonstrates motion artifact, but she is currently in normal sinus rhythm. Right hip x-ray with right intertrochanteric femoral neck fracture and varus angulation noted. Laboratory data with some mild creatinine elevation of 1.15 with baseline of 0.81 noted previously. She is also noted to have stable anemia. No overt bleeding identified. ED physician has spoken with Dr. Doreatha Martin of orthopedics who recommends transfer to [facility] for fixation.   Assessment & Plan:   Principal Problem:   Intertrochanteric fracture of right hip (HCC) Active Problems:   Hyperlipemia    Essential hypertension   Hemiplegia, late effect of cerebrovascular disease (HCC)   Rheumatoid arthritis (HCC)   CAD in native artery   Hip fracture (HCC)   AKI (acute kidney injury) ([place])  Right intertrochanteric femoral neck fracture status post mechanical fall CT head done was negative for acute stroke, she has some chronic changes prior stroke. Discussed CT and case with neurology. CT finding appears to be chronic, no need to do MRI prior sx.  EKG no st elevation. Sinus. She denies chest pain or dyspnea.  Underwent Intramedullary Nail femoral 6-07 PT per ortho. Plan to go Home with [facility] DVT prophylaxis per ortho. Discharge on lovenox   AKI; continue with IV fluids.  Stable.   Acute Blood loss anemia;  Post op.  Check anemia panel.  Started  ferrous sulfate.  Getting 2 units PRBC HB stable today.   Hypomagnesemia;  Resolved.    Prior CVA with right-sided hemiparesis.  Continue with aspirin and statins.  CT with chronic worsening of prior stroke. She will need to follow up with her neurologist.   HTN; Continue with metoprolol.   CAD; statin, aspirin, metoprolol/  RA; Leflunomide.   EKG ? A flutter. monitor on telemetry. Reviewed EKG with cardiologist, no evidence of A flutter.   Mild elevation of potassium ; she will get lasix. Repeat b-met this afternoon.  resolved.   HTN; resume BP medications    Discharge Diagnoses:  Principal Problem:   Intertrochanteric fracture of right hip ([place]) Active Problems:   Hyperlipemia   Essential hypertension   Hemiplegia, late effect of cerebrovascular disease (HCC)   Rheumatoid arthritis ([place])   CAD in native artery  Hip fracture ([place])   AKI (acute kidney injury) [facility])    Discharge Instructions  Discharge Instructions    Diet - low sodium heart healthy   Complete by:  As directed    Increase activity slowly   Complete by:  As directed      Allergies as of 09/26/2017      Reactions   Ace Inhibitors  Cough   New daily cough since starting ACE      Medication List    TAKE these medications   aspirin EC 81 MG tablet Take 81 mg by mouth daily.   atorvastatin 40 MG tablet Commonly known as:  LIPITOR Take 1 tablet (40 mg total) by mouth daily. What changed:  Another medication with the same name was removed. Continue taking this medication, and follow the directions you see here.   baclofen 10 MG tablet Commonly known as:  LIORESAL Take 10 mg by mouth 2 (two) times daily.   bisacodyl 10 MG suppository Commonly known as:  DULCOLAX Place 1 suppository (10 mg total) rectally daily as needed for moderate constipation.   docusate sodium 100 MG capsule Commonly known as:  COLACE Take 1 capsule (100 mg total) by mouth 2 (two) times daily.   enoxaparin 40 MG/0.4ML injection Commonly known as:  LOVENOX Inject 0.4 mLs (40 mg total) into the skin daily for 30 doses. For 30 days post op for DVT prophylaxis   ferrous sulfate 325 (65 FE) MG tablet Take 1 tablet (325 mg total) by mouth 2 (two) times daily with a meal.   fluticasone 50 MCG/ACT nasal spray Commonly known as:  FLONASE Place 1 spray into both nostrils daily.   folic acid 1 MG tablet Commonly known as:  FOLVITE TK 1 T PO QD   furosemide 40 MG tablet Commonly known as:  LASIX TAKE 1 TABLET BY MOUTH DAILY What changed:    how much to take  how to take this  when to take this   hydrochlorothiazide 25 MG tablet Commonly known as:  HYDRODIURIL Take 25 mg by mouth daily.   HYDROcodone-acetaminophen 5-325 MG tablet Commonly known as:  NORCO Take 1 tablet by mouth every 6 (six) hours as needed for up to 7 days for moderate pain. What changed:  See the new instructions.   leflunomide 20 MG tablet Commonly known as:  ARAVA Take 20 mg by mouth daily.   metoprolol succinate 25 MG 24 hr tablet Commonly known as:  TOPROL-XL Take 1 tablet (25 mg total) by mouth daily.   montelukast 10 MG tablet Commonly known as:   SINGULAIR TAKE 1 TABLET(10 MG) BY MOUTH AT BEDTIME   multivitamin with minerals Tabs tablet Take 1 tablet by mouth daily.   oxybutynin 5 MG tablet Commonly known as:  DITROPAN TAKE 1/2 TABLET BY MOUTH TWICE DAILY   polyethylene glycol packet Commonly known as:  MIRALAX / GLYCOLAX Take 17 g by mouth 2 (two) times daily.   potassium chloride SA 20 MEQ tablet Commonly known as:  K-DUR,KLOR-CON TAKE 1 TABLET BY MOUTH EVERY DAY   senna 8.6 MG Tabs tablet Commonly known as:  SENOKOT Take 1 tablet (8.6 mg total) by mouth 2 (two) times daily.   spironolactone 25 MG tablet Commonly known as:  ALDACTONE TAKE 1 TABLET(25 MG) BY MOUTH DAILY            Durable Medical Equipment  (From admission, onward)        Start     Ordered  09/26/17 0957  For home use only DME 3 n 1  Once     09/26/17 3762     Follow-up Information    Renette Butters, MD Follow up in 2 week(s).   Specialty:  Orthopedic Surgery Contact information: [address] [place] [address] [phone]          Allergies  Allergen Reactions  . Ace Inhibitors Cough    New daily cough since starting ACE    Consultations:  Ortho    Procedures/Studies: Ct Head Wo Contrast  Result Date: 09/23/2017 CLINICAL DATA:  62 year old female admitted after a fall. Increased headache. EXAM: CT HEAD WITHOUT CONTRAST TECHNIQUE: Contiguous axial images were obtained from the base of the skull through the vertex without intravenous contrast. COMPARISON:  Head CT without contrast 04/28/2009, brain MRI 03/28/2009. FINDINGS: Brain: Chronic large left MCA territory infarct with encephalomalacia and gliosis. There is a small new area of encephalomalacia compared to the 2011 CT along the para landed cortex seen on series 3, image 22 today. Increased regional white matter gliosis at that level as well. However, these have a chronic appearance. No associated hemorrhage or mass effect. Stable mild ex vacuo  enlargement of the left lateral ventricle. No acute cortically based infarct identified. Right hemisphere gray-white matter differentiation remains normal. Vascular: Calcified atherosclerosis at the skull base. No suspicious intracranial vascular hyperdensity. Skull: Stable.  No acute osseous abnormality identified. Sinuses/Orbits: Visualized paranasal sinuses and mastoids are stable and well pneumatized. Other: Visualized orbits and scalp soft tissues are within normal limits. IMPRESSION: 1. Some progression of the chronic left MCA infarct since the 2011 CT, but with a chronic appearance. 2. No acute cortically based infarct, acute intracranial hemorrhage, or other acute intracranial abnormality identified. 3. These results were communicated to Dr. Niel Hummer at 1322 hours on 6/7/2019by text page via the [facility] messaging system. Electronically Signed   By: Genevie Ann M.D.   On: 09/23/2017 13:27   Dg C-arm 1-60 Min  Result Date: 09/23/2017 CLINICAL DATA:  Right intramedullary nail placement for a right femoral intertrochanteric fracture. EXAM: RIGHT FEMUR 2 VIEWS; DG C-ARM 61-120 MIN COMPARISON:  Yesterday. FINDINGS: Four C-arm views of the right hip demonstrate placement of an intramedullary rod and compression screw bridging the previously demonstrated right intertrochanteric fracture. Normal alignment following hardware placement. No additional fractures are visualized. IMPRESSION: Hardware fixation of the previously demonstrated right intertrochanteric fracture. Electronically Signed   By: Claudie Revering M.D.   On: 09/23/2017 15:45   Dg Hip Unilat  With Pelvis 2-3 Views Right  Result Date: 09/22/2017 CLINICAL DATA:  Fall.  Hip pain. EXAM: DG HIP (WITH OR WITHOUT PELVIS) 2-3V RIGHT COMPARISON:  None. FINDINGS: Bones are diffusely demineralized. Intertrochanteric right femoral neck fracture evident with varus angulation. Degenerative changes are seen at the symphysis pubis. IMPRESSION: Right intertrochanteric  femoral neck fracture with varus angulation. Electronically Signed   By: Misty Stanley M.D.   On: 09/22/2017 12:50   Dg Femur, Min 2 Views Right  Result Date: 09/23/2017 CLINICAL DATA:  Right intramedullary nail placement for a right femoral intertrochanteric fracture. EXAM: RIGHT FEMUR 2 VIEWS; DG C-ARM 61-120 MIN COMPARISON:  Yesterday. FINDINGS: Four C-arm views of the right hip demonstrate placement of an intramedullary rod and compression screw bridging the previously demonstrated right intertrochanteric fracture. Normal alignment following hardware placement. No additional fractures are visualized. IMPRESSION: Hardware fixation of the previously demonstrated right intertrochanteric fracture. Electronically Signed   By: Claudie Revering  M.D.   On: 09/23/2017 15:45       Subjective: Aphasic. Pain controlled.   Discharge Exam: Vitals:   09/26/17 0439 09/26/17 0542  BP: (!) 175/118 (!) 151/65  Pulse:  76  Resp:    Temp:    SpO2:     Vitals:   09/25/17 2200 09/26/17 0400 09/26/17 0439 09/26/17 0542  BP: (!) 191/79 (!) 175/118 (!) 175/118 (!) 151/65  Pulse: 72 (!) 132  76  Resp: (!) 22 (!) 24    Temp: 98.5 F (36.9 C) (!) 97.5 F (36.4 C)    TempSrc: Oral Oral    SpO2: 100%     Weight:      Height:        General: Pt is alert, awake, not in acute distress Cardiovascular: RRR, S1/S2 +, no rubs, no gallops Respiratory: CTA bilaterally, no wheezing, no rhonchi Abdominal: Soft, NT, ND, bowel sounds + Extremities: no edema, no cyanosis, Chronic hemiparesis on the right, aphasia.     The results of significant diagnostics from this hospitalization (including imaging, microbiology, ancillary and laboratory) are listed below for reference.     Microbiology: Recent Results (from the past 240 hour(s))  Surgical pcr screen     Status: None   Collection Time: 09/23/17  9:57 AM  Result Value Ref Range Status   MRSA, PCR NEGATIVE NEGATIVE Final   Staphylococcus aureus NEGATIVE  NEGATIVE Final    Comment: (NOTE) The Xpert SA Assay (FDA approved for NASAL specimens in patients 53 years of age and older), is one component of a comprehensive surveillance program. It is not intended to diagnose infection nor to guide or monitor treatment. Performed at [facility], [place] [address] [place], [place] [address]      Labs: BNP (last 3 results) No results for input(s): BNP in the last 8760 hours. Basic Metabolic Panel: Recent Labs  Lab 09/22/17 1412 09/23/17 1211 09/23/17 1704 09/24/17 0812 09/25/17 0353 09/25/17 2112  NA 137  --   --  137 141 137  K 3.8  --   --  4.4 5.4* 3.8  CL 103  --   --  104 109 103  CO2 25  --   --  18* 26 25  GLUCOSE 101*  --   --  182* 110* 85  BUN 10  --   --  _0 CREATININE 1.15*  --  0.94 1.03* 0.84 0.97  CALCIUM 8.8*  --   --  7.8* 8.2* 8.0*  MG  --  1.6*  --  2.0  --   --    Liver Function Tests: Recent Labs  Lab 09/22/17 1412  AST 33  ALT 32  ALKPHOS 74  BILITOT 0.6  PROT 6.7  ALBUMIN 3.2*   No results for input(s): LIPASE, AMYLASE in the last 168 hours. No results for input(s): AMMONIA in the last 168 hours. CBC: Recent Labs  Lab 09/22/17 1412 09/23/17 1704 09/24/17 0812 09/25/17 0353 09/26/17 0519  WBC 10.5 7.6 9.6 12.3* 10.3  NEUTROABS 8.7*  --   --   --   --   HGB 9.9* 9.4* 8.0* 6.8* 11.4*  HCT 30.5* 28.6* 24.4* 22.1* 34.5*  MCV 99.7 96.9 99.2 101.8* 95.3  PLT 186 163 155 166 188   Cardiac Enzymes: No results for input(s): CKTOTAL, CKMB, CKMBINDEX, TROPONINI in the last 168 hours. BNP: Invalid input(s): POCBNP CBG: No results for input(s): GLUCAP in the last 168 hours. D-Dimer No results  for input(s): DDIMER in the last 72 hours. Hgb A1c No results for input(s): HGBA1C in the last 72 hours. Lipid Profile No results for input(s): CHOL, HDL, LDLCALC, TRIG, CHOLHDL, LDLDIRECT in the last 72 hours. Thyroid function studies No results for input(s): TSH, T4TOTAL, T3FREE, THYROIDAB in  the last 72 hours.  Invalid input(s): FREET3 Anemia work up Recent Labs    09/25/17 0353  VITAMINB12 442  FERRITIN 636*  TIBC 169*  IRON 54  RETICCTPCT 2.2   Urinalysis    Component Value Date/Time   COLORURINE YELLOW 04/28/2009 0825   APPEARANCEUR CLEAR 04/28/2009 0825   LABSPEC 1.020 04/28/2009 0825   PHURINE 6.5 04/28/2009 0825   GLUCOSEU NEGATIVE 04/28/2009 0825   HGBUR NEGATIVE 04/28/2009 0825   BILIRUBINUR NEGATIVE 04/28/2009 0825   KETONESUR NEGATIVE 04/28/2009 0825   PROTEINUR NEGATIVE 04/28/2009 0825   UROBILINOGEN 0.2 04/28/2009 0825   NITRITE NEGATIVE 04/28/2009 0825   LEUKOCYTESUR  04/28/2009 0825    NEGATIVE MICROSCOPIC NOT DONE ON URINES WITH NEGATIVE PROTEIN, BLOOD, LEUKOCYTES, NITRITE, OR GLUCOSE <1000 mg/dL.   Sepsis Labs Invalid input(s): PROCALCITONIN,  WBC,  LACTICIDVEN Microbiology Recent Results (from the past 240 hour(s))  Surgical pcr screen     Status: None   Collection Time: 09/23/17  9:57 AM  Result Value Ref Range Status   MRSA, PCR NEGATIVE NEGATIVE Final   Staphylococcus aureus NEGATIVE NEGATIVE Final    Comment: (NOTE) The Xpert SA Assay (FDA approved for NASAL specimens in patients 30 years of age and older), is one component of a comprehensive surveillance program. It is not intended to diagnose infection nor to guide or monitor treatment. Performed at [facility], [place] [address] [place], [place] [address]      Time coordinating discharge: 35 minutes.   SIGNED:   Elmarie Shiley, MD  [facility] 09/26/2017, 9:59 AM Pager [phone]  If 7PM-7AM, please contact night-coverage [email or web address] Password [email or web address]

## 2017-09-26 NOTE — Progress Notes (Signed)
VASCULAR LAB PRELIMINARY  PRELIMINARY  PRELIMINARY  PRELIMINARY  Right lower extremity venous duplex completed.    Preliminary report:  There is no DVT or SVT noted in the right lower extremity.   Raynald Rouillard, RVT 09/26/2017, 7:54 AM

## 2017-09-26 NOTE — Progress Notes (Signed)
Physical Therapy Treatment Patient Details Name: Danielle Cruz MRN: [id number] DOB: August 11, 1955 Today's Date: 09/26/2017    History of Present Illness Pt is a 62 y.o. female with medical history significant for hypertension, dyslipidemia, rheumatoid arthritis, tobacco abuse, CAD with prior stents, and prior CVA with right-sided hemiplegia who ambulates with a quad cane and uses a wheelchair at home.  She fell at home sustaining R hip fx and underwent IM nailing 09/23/17.     PT Comments    Pt performed supine to sit and sit to supine with maximal assistance.  Pt agitated and limited due to pain.  Husband at bed side and assisted patient to sitting edge of bed. In sitting she limited weight bearing to R and required assistance to shift weight in sitting.  Pt issued HEP.  Reviewed exercises and technique with her husband.  Husband reports she will have assistance from him at home and when he is not present other family members will be as well as hired care from aide.  Pt educated to get OOB as much as possible.  Even with limited sitting edge of bed she managed to cough up sputum.  Pt educated to spit this out when it happens.  Plan is for d/c home this afternoon.     Follow Up Recommendations  Home health PT;Supervision/Assistance - 24 hour     Equipment Recommendations  3in1 (PT)    Recommendations for Other Services       Precautions / Restrictions Precautions Precautions: Fall Required Braces or Orthoses: Other Brace/Splint Other Brace/Splint: Pt wears R wrist splint due to deficits from previous CVA. Restrictions Weight Bearing Restrictions: Yes RLE Weight Bearing: Weight bearing as tolerated    Mobility  Bed Mobility Overal bed mobility: Needs Assistance Bed Mobility: Sit to Supine;Supine to Sit     Supine to sit: HOB elevated;Max assist Sit to supine: Max assist   General bed mobility comments: Pt resistive to movement from PTA so her husband assisted into sitting.  Pt  present with R lateral lean.  Pt did sit up x 5 min and perform L LAQS in sitting actively.  Returning to supine patient was more responsive to assistance and less agitated.    Transfers Overall transfer level: (refused OOB into standing. Educated on the benefits of weight bearing on R LE.  )   Transfers: Stand Pivot Transfers Sit to Stand: Mod assist;+2 physical assistance         General transfer comment: modA +2 to ascend into standing, with max cueing and encouargement, given cueing for hand placement, B feet positioning, and foward lean.  Patient fearful.   Ambulation/Gait                 Stairs             Wheelchair Mobility    Modified Rankin (Stroke Patients Only)       Balance Overall balance assessment: Needs assistance Sitting-balance support: No upper extremity supported;Feet supported Sitting balance-Leahy Scale: Poor Sitting balance - Comments: L lateral lean.  Performed L LAQS to encourage weight shifting to R in sitting.     Standing balance support: Bilateral upper extremity supported;During functional activity Standing balance-Leahy Scale: Poor Standing balance comment: reliant on UE support in stance                            Cognition Arousal/Alertness: Awake/alert Behavior During Therapy: Agitated Overall Cognitive Status: Difficult to assess  General Comments: Pt very fearful of falling and increased pain.      Exercises Total Joint Exercises Quad Sets: AROM;Right;5 reps(trace quad contractions) Heel Slides: AAROM;Right;10 reps;Supine;PROM Hip ABduction/ADduction: AAROM;PROM;Right;10 reps;Supine Long Arc Quad: AROM;Left;10 reps;Seated    General Comments General comments (skin integrity, edema, etc.): Daughter and husband present.  Patient with increased participation with daughters support.       Pertinent Vitals/Pain Pain Assessment: Faces Faces Pain Scale: Hurts  worst Pain Location: R hip with mobility Pain Descriptors / Indicators: Grimacing;Guarding Pain Intervention(s): Monitored during session;Repositioned;Premedicated before session;Ice applied    Home Living                      Prior Function            PT Goals (current goals can now be found in the care plan section) Acute Rehab PT Goals Patient Stated Goal: Home per husband Potential to Achieve Goals: Good Progress towards PT goals: Progressing toward goals    Frequency    Min 3X/week      PT Plan Current plan remains appropriate    Co-evaluation              AM-PAC PT "6 Clicks" Daily Activity  Outcome Measure  Difficulty turning over in bed (including adjusting bedclothes, sheets and blankets)?: Unable Difficulty moving from lying on back to sitting on the side of the bed? : Unable Difficulty sitting down on and standing up from a chair with arms (e.g., wheelchair, bedside commode, etc,.)?: Unable Help needed moving to and from a bed to chair (including a wheelchair)?: Total Help needed walking in hospital room?: Total Help needed climbing 3-5 steps with a railing? : Total 6 Click Score: 6    End of Session   Activity Tolerance: Treatment limited secondary to agitation Patient left: in bed;with family/visitor present Nurse Communication: Mobility status PT Visit Diagnosis: Other abnormalities of gait and mobility (R26.89);Pain Pain - Right/Left: Right Pain - part of body: Hip     Time: 1243-1310 PT Time Calculation (min) (ACUTE ONLY): 27 min  Charges:  $Therapeutic Exercise: 8-22 mins $Therapeutic Activity: 8-22 mins                    G Codes:       Governor Rooks, PTA pager [phone]    Cristela Blue 09/26/2017, 1:25 PM

## 2017-09-26 NOTE — Care Management Note (Signed)
Case Management Note  Patient Details  Name: Danielle Cruz MRN: [id number] Date of Birth: 1955/05/14  Subjective/Objective:                    Action/Plan:  Spoke to patient and daughter at bedside. Daughter states patient non verbal. Choice given daughter wants [facility] referral given to [place]. With [facility]. Called Jeneen Rinks with [facility] for 3 in 1  Expected Discharge Date:  09/26/17               Expected Discharge Plan:  [place]  In-House Referral:     Discharge planning Services  CM Consult  Post Acute Care Choice:  Durable Medical Equipment, Home Health Choice offered to:  Adult Children, Patient  DME Arranged:  3-N-1 DME Agency:  [place] Arranged:  RN, PT, OT, Nurse's Aide [place] Agency:  [place]  Status of Service:  Completed, signed off  If discussed at [place] of Stay Meetings, dates discussed:    Additional Comments:  Marilu Favre, RN 09/26/2017, 10:52 AM

## 2017-09-26 NOTE — Progress Notes (Signed)
Paged on call MD regarding patient's BP 175/118. Prn med hydralazine given. Awaiting RTC from on call MD.

## 2017-09-26 NOTE — Progress Notes (Signed)
    Subjective: Patient reports pain as mild.  Daughter at bedside.  Patient lives with her daughter and has PCA to help with ADLs at home.  Objective:   VITALS:   Vitals:   09/25/17 2200 09/26/17 0400 09/26/17 0439 09/26/17 0542  BP: (!) 191/79 (!) 175/118 (!) 175/118 (!) 151/65  Pulse: 72 (!) 132  76  Resp: (!) 22 (!) 24    Temp: 98.5 F (36.9 C) (!) 97.5 F (36.4 C)    TempSrc: Oral Oral    SpO2: 100%     Weight:      Height:       CBC Latest Ref Rng & Units 09/26/2017 09/25/2017 09/24/2017  WBC 4.0 - 10.5 K/uL 10.3 12.3(H) 9.6  Hemoglobin 12.0 - 15.0 g/dL 11.4(L) 6.8(LL) 8.0(L)  Hematocrit 36.0 - 46.0 % 34.5(L) 22.1(L) 24.4(L)  Platelets 150 - 400 K/uL 188 166 155   BMP Latest Ref Rng & Units 09/25/2017 09/25/2017 09/24/2017  Glucose 65 - 99 mg/dL 85 110(H) 182(H)  BUN 6 - 20 mg/dL 17 13 13   Creatinine 0.44 - 1.00 mg/dL 0.97 0.84 1.03(H)  BUN/Creat Ratio 6 - 22 (calc) - - -  Sodium 135 - 145 mmol/L 137 141 137  Potassium 3.5 - 5.1 mmol/L 3.8 5.4(H) 4.4  Chloride 101 - 111 mmol/L 103 109 104  CO2 22 - 32 mmol/L 25 26 18(L)  Calcium 8.9 - 10.3 mg/dL 8.0(L) 8.2(L) 7.8(L)   Intake/Output      06/09 0701 - 06/10 0700 06/10 0701 - 06/11 0700   P.O. 360    I.V. (mL/kg) 50 (0.7)    Blood 315    Total Intake(mL/kg) 725 (10.8)    Urine (mL/kg/hr) 1200 (0.7)    Total Output 1200    Net -475            Physical Exam: General: NAD.  Upright in bed.  Daughter at bedside.  No increased work of breathing. MSK RLE: Feet warm.   Motor and sensory function at baseline Dressings CDI   Assessment / Plan: 3 Days Post-Op  S/P Procedure(s) (LRB): INTRAMEDULLARY (IM) NAIL FEMORAL (Right) by Dr. Ernesta Amble. Percell Miller on 12/24/2017  Principal Problem:   Intertrochanteric fracture of right hip (HCC) Active Problems:   Hyperlipemia   Essential hypertension   Hemiplegia, late effect of cerebrovascular disease (HCC)   Rheumatoid arthritis ([place])   CAD in native artery   Hip fracture  (HCC)   AKI (acute kidney injury) ([place])  ABLA: Improved 11.4 < 6.8 after transfusion.  Platelets WNL.  Right intertrochanteric hip fracture status post IM nail Stable from an orthopedic perspective  Incentive Spirometry Apply ice as needed Weightbearing: WBAT RLE Insicional and dressing care: Dressings left intact until follow-up Showering: Keep dressing dry VTE prophylaxis: Lovenox 40mg  qd 30 days, SCDs, ambulation Pain control: Continue current regimen. Follow - up plan: Follow up in the office with Dr. Alain Marion in 2 weeks.  Please call with questions.  Contact information:  Edmonia Lynch MD, Roxan Hockey PA-C  Dispo:   Likely home when stable medically.   Charna Elizabeth Martensen III, PA-C 09/26/2017, 7:17 AM

## 2017-09-26 NOTE — Progress Notes (Signed)
Pt given discharge instructions and prescriptions. Instructions gone over with her, husband and daughter present. RN went over Lovenox injection instructions on where injection should go, cleaning the area and administering the injection, both husband and daughter verbalized understanding and RN emphasized that if any questions should arise to please call pharmacist and ask them or MD's office. All belongings gathered to be sent home.

## 2017-09-26 NOTE — Progress Notes (Signed)
Occupational Therapy Treatment Patient Details Name: Danielle Cruz MRN: [id number] DOB: 03/17/1956 Today's Date: 09/26/2017    History of present illness Pt is a 62 y.o. female with medical history significant for hypertension, dyslipidemia, rheumatoid arthritis, tobacco abuse, CAD with prior stents, and prior CVA with right-sided hemiplegia who ambulates with a quad cane and uses a wheelchair at home.  She fell at home sustaining R hip fx and underwent IM nailing 09/23/17.    OT comments  Patient progressing with functional transfers, transitioned from using steady lift to stand pivot transfer today, although continues to require +2 assist for transfers.  Educated family on having 2 person assist for all transfers and toileting, as patient is extremely fearful of falling and is impulsive at times; family agreeable.  Family verbalize safety precautions and demonstrate ability to assist patient with her needs at discharge. Will continue to follow patient while admitted.    Follow Up Recommendations  Home health OT;Supervision/Assistance - 24 hour(educated family on +2 assist for transfers and toileting )    Equipment Recommendations  3 in 1 bedside commode    Recommendations for Other Services      Precautions / Restrictions Precautions Precautions: Fall Required Braces or Orthoses: Other Brace/Splint Other Brace/Splint: Pt wears R wrist splint due to deficits from previous CVA. Restrictions Weight Bearing Restrictions: Yes RLE Weight Bearing: Weight bearing as tolerated       Mobility Bed Mobility Overal bed mobility: Needs Assistance Bed Mobility: Sit to Supine;Supine to Sit     Supine to sit: HOB elevated;Mod assist;+2 for physical assistance Sit to supine: Mod assist;+2 for physical assistance   General bed mobility comments: Mod A for family to manage R off when transitioning to sitting EOB and therapist to support trunk (reduce posterior lean), mod A for family to manage BLEs  to supine   Transfers Overall transfer level: Needs assistance   Transfers: Stand Pivot Transfers Sit to Stand: Mod assist;+2 physical assistance         General transfer comment: modA +2 to ascend into standing, with max cueing and encouargement, given cueing for hand placement, B feet positioning, and foward lean.  Patient fearful.     Balance Overall balance assessment: Needs assistance Sitting-balance support: No upper extremity supported;Feet supported Sitting balance-Leahy Scale: Good     Standing balance support: Bilateral upper extremity supported;During functional activity Standing balance-Leahy Scale: Poor Standing balance comment: reliant on UE support in stance                           ADL either performed or assessed with clinical judgement   ADL Overall ADL's : Needs assistance/impaired                         Toilet Transfer: +2 for physical assistance;Stand-pivot;Moderate assistance Toilet Transfer Details (indicate cue type and reason): EOB to BSC, max cueing for sequencing, safety, patient fearful and presents with posterior lean  Toileting- Clothing Manipulation and Hygiene: Total assistance;+2 for physical assistance;Cueing for safety;Cueing for sequencing Toileting - Clothing Manipulation Details (indicate cue type and reason): +1 mod assist to maintain standing, +1 hygiene         General ADL Comments: Pt with significant fear of falling. Due to cognitive deficits, requiring increased time and encouragment to participate in functional transfer. Patient appears more comfortable with family support.      Vision       Perception  Praxis      Cognition Arousal/Alertness: Awake/alert Behavior During Therapy: Anxious;Impulsive Overall Cognitive Status: Difficult to assess                                 General Comments: Pt very fearful of falling and increased pain.        Exercises     Shoulder  Instructions       General Comments Daughter and husband present.  Patient with increased participation with daughters support.     Pertinent Vitals/ Pain       Faces Pain Scale: Hurts even more Pain Location: R hip with mobility Pain Descriptors / Indicators: Grimacing;Guarding Pain Intervention(s): Monitored during session;Limited activity within patient's tolerance  Home Living                                          Prior Functioning/Environment              Frequency  Min 3X/week        Progress Toward Goals  OT Goals(current goals can now be found in the care plan section)  Progress towards OT goals: Progressing toward goals  Acute Rehab OT Goals Patient Stated Goal: home per daughter OT Goal Formulation: With patient/family Time For Goal Achievement: 10/08/17 Potential to Achieve Goals: Good  Plan Discharge plan remains appropriate;Frequency remains appropriate    Co-evaluation                 AM-PAC PT "6 Clicks" Daily Activity     Outcome Measure   Help from another person eating meals?: A Little Help from another person taking care of personal grooming?: A Little Help from another person toileting, which includes using toliet, bedpan, or urinal?: Total Help from another person bathing (including washing, rinsing, drying)?: A Lot Help from another person to put on and taking off regular upper body clothing?: A Lot Help from another person to put on and taking off regular lower body clothing?: Total 6 Click Score: 12    End of Session Equipment Utilized During Treatment: Gait belt;Other (comment)(BSC)  OT Visit Diagnosis: Unsteadiness on feet (R26.81);Other abnormalities of gait and mobility (R26.89);Muscle weakness (generalized) (M62.81);Other symptoms and signs involving cognitive function;Pain Pain - Right/Left: Right Pain - part of body: Hip   Activity Tolerance Patient tolerated treatment well;Patient limited by  pain   Patient Left in bed;with call bell/phone within reach;with family/visitor present   Nurse Communication          Time: 3013-1438 OT Time Calculation (min): 32 min  Charges: OT General Charges $OT Visit: 1 Visit OT Treatments $Self Care/Home Management : 23-37 mins  Delight Stare, OTR/L  Pager [place] 09/26/2017, 1:15 PM

## 2017-09-26 NOTE — Care Management Important Message (Signed)
Important Message  Patient Details  Name: Danielle Cruz MRN: [id number] Date of Birth: 1955/05/14   Medicare Important Message Given:  Yes    Daiya Tamer 09/26/2017, 4:02 PM

## 2017-09-27 ENCOUNTER — Telehealth: Payer: Self-pay | Admitting: Cardiology

## 2017-09-27 DIAGNOSIS — I69320 Aphasia following cerebral infarction: Secondary | ICD-10-CM | POA: Diagnosis not present

## 2017-09-27 DIAGNOSIS — I69351 Hemiplegia and hemiparesis following cerebral infarction affecting right dominant side: Secondary | ICD-10-CM | POA: Diagnosis not present

## 2017-09-27 DIAGNOSIS — E785 Hyperlipidemia, unspecified: Secondary | ICD-10-CM | POA: Diagnosis not present

## 2017-09-27 DIAGNOSIS — D649 Anemia, unspecified: Secondary | ICD-10-CM | POA: Diagnosis not present

## 2017-09-27 DIAGNOSIS — I2510 Atherosclerotic heart disease of native coronary artery without angina pectoris: Secondary | ICD-10-CM | POA: Diagnosis not present

## 2017-09-27 DIAGNOSIS — S72141D Displaced intertrochanteric fracture of right femur, subsequent encounter for closed fracture with routine healing: Secondary | ICD-10-CM | POA: Diagnosis not present

## 2017-09-27 DIAGNOSIS — M069 Rheumatoid arthritis, unspecified: Secondary | ICD-10-CM | POA: Diagnosis not present

## 2017-09-27 DIAGNOSIS — W19XXXD Unspecified fall, subsequent encounter: Secondary | ICD-10-CM | POA: Diagnosis not present

## 2017-09-27 DIAGNOSIS — Z79899 Other long term (current) drug therapy: Secondary | ICD-10-CM | POA: Diagnosis not present

## 2017-09-27 DIAGNOSIS — F17210 Nicotine dependence, cigarettes, uncomplicated: Secondary | ICD-10-CM | POA: Diagnosis not present

## 2017-09-27 DIAGNOSIS — I10 Essential (primary) hypertension: Secondary | ICD-10-CM | POA: Diagnosis not present

## 2017-09-27 DIAGNOSIS — Z7982 Long term (current) use of aspirin: Secondary | ICD-10-CM | POA: Diagnosis not present

## 2017-09-27 MED ORDER — FUROSEMIDE 40 MG PO TABS: ORAL_TABLET | ORAL | 3 refills | Status: DC

## 2017-09-27 NOTE — Telephone Encounter (Signed)
Needing refill for lasix sent to [facility]

## 2017-09-28 ENCOUNTER — Telehealth: Payer: Self-pay

## 2017-09-28 ENCOUNTER — Telehealth: Payer: Self-pay | Admitting: Family Medicine

## 2017-09-28 DIAGNOSIS — I69320 Aphasia following cerebral infarction: Secondary | ICD-10-CM | POA: Diagnosis not present

## 2017-09-28 DIAGNOSIS — I2510 Atherosclerotic heart disease of native coronary artery without angina pectoris: Secondary | ICD-10-CM | POA: Diagnosis not present

## 2017-09-28 DIAGNOSIS — S72141D Displaced intertrochanteric fracture of right femur, subsequent encounter for closed fracture with routine healing: Secondary | ICD-10-CM | POA: Diagnosis not present

## 2017-09-28 DIAGNOSIS — I69351 Hemiplegia and hemiparesis following cerebral infarction affecting right dominant side: Secondary | ICD-10-CM | POA: Diagnosis not present

## 2017-09-28 DIAGNOSIS — I10 Essential (primary) hypertension: Secondary | ICD-10-CM | POA: Diagnosis not present

## 2017-09-28 DIAGNOSIS — M069 Rheumatoid arthritis, unspecified: Secondary | ICD-10-CM | POA: Diagnosis not present

## 2017-09-28 NOTE — Telephone Encounter (Signed)
Verbal order given  

## 2017-09-28 NOTE — Telephone Encounter (Signed)
Transition Care Management Follow-up Telephone Call   Date discharged? 09/26/17                How have you been since you were released from the hospital? In pain with her hip   Do you understand why you were in the hospital? Broken hip   Do you understand the discharge instructions? Yes, the nurse come out yesterday and explained everything again   Where were you discharged to? Home   Items Reviewed:  Medications reviewed: yes  Allergies reviewed: yes  Dietary changes reviewed: yes  Referrals reviewed: yes, follow up with surgeon who did surgery   Functional Questionnaire:   Activities of Daily Living (ADLs):  yes, has a nurse coming in, OT coming in, and a caretaker full time    Any transportation issues/concerns?: No   Any patient concerns? No   Confirmed importance and date/time of follow-up visits scheduled Will send a message to the front staff to have them schedule a TOC follow up appt.     Confirmed with patient if condition begins to worsen call PCP or go to the ER.  Patient was given the office number and encouraged to call back with question or concerns. Yes with verbal understanding

## 2017-09-28 NOTE — Telephone Encounter (Signed)
St[facility] physical therapist from [facility] is requesting a verbal for 2x this week, then 3x a week for 3 weeks. Cb# [phone]

## 2017-09-29 ENCOUNTER — Telehealth: Payer: Self-pay | Admitting: Family Medicine

## 2017-09-29 NOTE — Telephone Encounter (Signed)
Patients husband and caregiver called in to request a prescription for 30 blue pads, large pullups, and wipes. Cb#: [phone]  Send to [employer]

## 2017-09-30 DIAGNOSIS — I10 Essential (primary) hypertension: Secondary | ICD-10-CM | POA: Diagnosis not present

## 2017-09-30 DIAGNOSIS — I69351 Hemiplegia and hemiparesis following cerebral infarction affecting right dominant side: Secondary | ICD-10-CM | POA: Diagnosis not present

## 2017-09-30 DIAGNOSIS — I2510 Atherosclerotic heart disease of native coronary artery without angina pectoris: Secondary | ICD-10-CM | POA: Diagnosis not present

## 2017-09-30 DIAGNOSIS — S72141D Displaced intertrochanteric fracture of right femur, subsequent encounter for closed fracture with routine healing: Secondary | ICD-10-CM | POA: Diagnosis not present

## 2017-09-30 DIAGNOSIS — I69320 Aphasia following cerebral infarction: Secondary | ICD-10-CM | POA: Diagnosis not present

## 2017-09-30 DIAGNOSIS — M069 Rheumatoid arthritis, unspecified: Secondary | ICD-10-CM | POA: Diagnosis not present

## 2017-09-30 NOTE — Telephone Encounter (Signed)
done

## 2017-10-03 ENCOUNTER — Encounter (HOSPITAL_COMMUNITY): Payer: Self-pay | Admitting: *Deleted

## 2017-10-03 ENCOUNTER — Observation Stay (HOSPITAL_COMMUNITY)
Admission: EM | Admit: 2017-10-03 | Discharge: 2017-10-04 | Disposition: A | Discharge status: Home Health | Payer: Medicare Other | Attending: Internal Medicine | Admitting: Internal Medicine

## 2017-10-03 ENCOUNTER — Other Ambulatory Visit: Payer: Self-pay

## 2017-10-03 ENCOUNTER — Emergency Department (HOSPITAL_COMMUNITY): Payer: Medicare Other

## 2017-10-03 DIAGNOSIS — Z8042 Family history of malignant neoplasm of prostate: Secondary | ICD-10-CM | POA: Diagnosis not present

## 2017-10-03 DIAGNOSIS — Z803 Family history of malignant neoplasm of breast: Secondary | ICD-10-CM | POA: Insufficient documentation

## 2017-10-03 DIAGNOSIS — I10 Essential (primary) hypertension: Secondary | ICD-10-CM | POA: Diagnosis not present

## 2017-10-03 DIAGNOSIS — G9389 Other specified disorders of brain: Secondary | ICD-10-CM | POA: Insufficient documentation

## 2017-10-03 DIAGNOSIS — Z800 Family history of malignant neoplasm of digestive organs: Secondary | ICD-10-CM | POA: Insufficient documentation

## 2017-10-03 DIAGNOSIS — F17210 Nicotine dependence, cigarettes, uncomplicated: Secondary | ICD-10-CM | POA: Diagnosis not present

## 2017-10-03 DIAGNOSIS — Z7982 Long term (current) use of aspirin: Secondary | ICD-10-CM | POA: Diagnosis not present

## 2017-10-03 DIAGNOSIS — I69351 Hemiplegia and hemiparesis following cerebral infarction affecting right dominant side: Secondary | ICD-10-CM | POA: Diagnosis not present

## 2017-10-03 DIAGNOSIS — Z79899 Other long term (current) drug therapy: Secondary | ICD-10-CM | POA: Insufficient documentation

## 2017-10-03 DIAGNOSIS — Z807 Family history of other malignant neoplasms of lymphoid, hematopoietic and related tissues: Secondary | ICD-10-CM | POA: Insufficient documentation

## 2017-10-03 DIAGNOSIS — E785 Hyperlipidemia, unspecified: Secondary | ICD-10-CM | POA: Insufficient documentation

## 2017-10-03 DIAGNOSIS — J9811 Atelectasis: Secondary | ICD-10-CM | POA: Diagnosis not present

## 2017-10-03 DIAGNOSIS — K625 Hemorrhage of anus and rectum: Principal | ICD-10-CM | POA: Insufficient documentation

## 2017-10-03 DIAGNOSIS — Z888 Allergy status to other drugs, medicaments and biological substances status: Secondary | ICD-10-CM | POA: Insufficient documentation

## 2017-10-03 DIAGNOSIS — Z7901 Long term (current) use of anticoagulants: Secondary | ICD-10-CM | POA: Insufficient documentation

## 2017-10-03 DIAGNOSIS — K922 Gastrointestinal hemorrhage, unspecified: Secondary | ICD-10-CM | POA: Diagnosis present

## 2017-10-03 DIAGNOSIS — R58 Hemorrhage, not elsewhere classified: Secondary | ICD-10-CM | POA: Diagnosis not present

## 2017-10-03 DIAGNOSIS — K254 Chronic or unspecified gastric ulcer with hemorrhage: Secondary | ICD-10-CM | POA: Diagnosis not present

## 2017-10-03 DIAGNOSIS — I517 Cardiomegaly: Secondary | ICD-10-CM | POA: Diagnosis not present

## 2017-10-03 DIAGNOSIS — Z7951 Long term (current) use of inhaled steroids: Secondary | ICD-10-CM | POA: Insufficient documentation

## 2017-10-03 DIAGNOSIS — K921 Melena: Secondary | ICD-10-CM

## 2017-10-03 DIAGNOSIS — I69959 Hemiplegia and hemiparesis following unspecified cerebrovascular disease affecting unspecified side: Secondary | ICD-10-CM

## 2017-10-03 DIAGNOSIS — S72141A Displaced intertrochanteric fracture of right femur, initial encounter for closed fracture: Secondary | ICD-10-CM | POA: Diagnosis present

## 2017-10-03 LAB — POC OCCULT BLOOD, ED: Fecal Occult Bld: POSITIVE — AB

## 2017-10-03 LAB — TYPE AND SCREEN
ABO/RH(D): O POS
Antibody Screen: NEGATIVE

## 2017-10-03 LAB — CBC WITH DIFFERENTIAL/PLATELET
Basophils Absolute: 0 10*3/uL (ref 0.0–0.1)
Basophils Relative: 0 %
Eosinophils Absolute: 0.1 10*3/uL (ref 0.0–0.7)
Eosinophils Relative: 1 %
HCT: 34.5 % — ABNORMAL LOW (ref 36.0–46.0)
Hemoglobin: 11.3 g/dL — ABNORMAL LOW (ref 12.0–15.0)
Lymphocytes Relative: 13 %
Lymphs Abs: 1.3 10*3/uL (ref 0.7–4.0)
MCH: 32.3 pg (ref 26.0–34.0)
MCHC: 32.8 g/dL (ref 30.0–36.0)
MCV: 98.6 fL (ref 78.0–100.0)
Monocytes Absolute: 1.3 10*3/uL — ABNORMAL HIGH (ref 0.1–1.0)
Monocytes Relative: 13 %
Neutro Abs: 7.6 10*3/uL (ref 1.7–7.7)
Neutrophils Relative %: 73 %
Platelets: 276 10*3/uL (ref 150–400)
RBC: 3.5 MIL/uL — ABNORMAL LOW (ref 3.87–5.11)
RDW: 16.2 % — ABNORMAL HIGH (ref 11.5–15.5)
WBC: 10.3 10*3/uL (ref 4.0–10.5)

## 2017-10-03 LAB — COMPREHENSIVE METABOLIC PANEL
ALT: 57 U/L — ABNORMAL HIGH (ref 14–54)
AST: 44 U/L — ABNORMAL HIGH (ref 15–41)
Albumin: 3.2 g/dL — ABNORMAL LOW (ref 3.5–5.0)
Alkaline Phosphatase: 107 U/L (ref 38–126)
Anion gap: 11 (ref 5–15)
BUN: 20 mg/dL (ref 6–20)
CO2: 24 mmol/L (ref 22–32)
Calcium: 9.1 mg/dL (ref 8.9–10.3)
Chloride: 103 mmol/L (ref 101–111)
Creatinine, Ser: 0.85 mg/dL (ref 0.44–1.00)
GFR calc Af Amer: >60 mL/min (ref >60)
GFR calc non Af Amer: >60 mL/min (ref >60)
Glucose, Bld: 109 mg/dL — ABNORMAL HIGH (ref 65–99)
Potassium: 3.9 mmol/L (ref 3.5–5.1)
Sodium: 138 mmol/L (ref 135–145)
Total Bilirubin: 0.9 mg/dL (ref 0.3–1.2)
Total Protein: 7.6 g/dL (ref 6.5–8.1)

## 2017-10-03 LAB — PROTIME-INR
INR: 1.16
Prothrombin Time: 14.7 seconds (ref 11.4–15.2)

## 2017-10-03 MED ORDER — PANTOPRAZOLE SODIUM 40 MG PO TBEC
40.0000 mg | DELAYED_RELEASE_TABLET | Freq: Two times a day (BID) | ORAL | Status: DC
  Filled 2017-10-03: qty 1

## 2017-10-03 MED ORDER — PANTOPRAZOLE SODIUM 40 MG PO TBEC
40.0000 mg | DELAYED_RELEASE_TABLET | Freq: Every day | ORAL | Status: DC
  Administered 2017-10-03 – 2017-10-04 (×2): 40 mg via ORAL
  Filled 2017-10-03 (×2): qty 1

## 2017-10-03 MED ORDER — MONTELUKAST SODIUM 10 MG PO TABS
10.0000 mg | ORAL_TABLET | Freq: Every day | ORAL | Status: DC
  Administered 2017-10-03: 10 mg via ORAL
  Filled 2017-10-03: qty 1

## 2017-10-03 MED ORDER — POLYETHYLENE GLYCOL 3350 17 G PO PACK
17.0000 g | PACK | Freq: Two times a day (BID) | ORAL | Status: DC
  Administered 2017-10-03 – 2017-10-04 (×2): 17 g via ORAL
  Filled 2017-10-03 (×2): qty 1

## 2017-10-03 MED ORDER — SODIUM CHLORIDE 0.9 % IV BOLUS
1000.0000 mL | Freq: Once | INTRAVENOUS | Status: AC
  Administered 2017-10-03: 1000 mL via INTRAVENOUS

## 2017-10-03 MED ORDER — HYDROCORTISONE ACETATE 25 MG RE SUPP
25.0000 mg | Freq: Two times a day (BID) | RECTAL | Status: DC
  Administered 2017-10-03 – 2017-10-04 (×3): 25 mg via RECTAL
  Filled 2017-10-03 (×3): qty 1

## 2017-10-03 MED ORDER — ONDANSETRON HCL 4 MG/2ML IJ SOLN: 4.0000 mg | Freq: Four times a day (QID) | INTRAMUSCULAR | Status: DC | PRN

## 2017-10-03 MED ORDER — SODIUM CHLORIDE 0.9 % IV SOLN
8.0000 mg/h | INTRAVENOUS | Status: DC
  Administered 2017-10-03: 8 mg/h via INTRAVENOUS
  Filled 2017-10-03 (×3): qty 80

## 2017-10-03 MED ORDER — ADULT MULTIVITAMIN W/MINERALS CH
1.0000 | ORAL_TABLET | Freq: Every day | ORAL | Status: DC
  Administered 2017-10-03 – 2017-10-04 (×2): 1 via ORAL
  Filled 2017-10-03 (×2): qty 1

## 2017-10-03 MED ORDER — SODIUM CHLORIDE 0.9 % IV SOLN
80.0000 mg | Freq: Once | INTRAVENOUS | Status: AC
  Administered 2017-10-03: 80 mg via INTRAVENOUS
  Filled 2017-10-03: qty 80

## 2017-10-03 MED ORDER — METOPROLOL SUCCINATE ER 25 MG PO TB24
25.0000 mg | ORAL_TABLET | Freq: Every day | ORAL | Status: DC
  Administered 2017-10-03 – 2017-10-04 (×2): 25 mg via ORAL
  Filled 2017-10-03 (×2): qty 1

## 2017-10-03 MED ORDER — ACETAMINOPHEN 650 MG RE SUPP: 650.0000 mg | Freq: Four times a day (QID) | RECTAL | Status: DC | PRN

## 2017-10-03 MED ORDER — SODIUM CHLORIDE 0.9 % IV SOLN
INTRAVENOUS | Status: DC
  Administered 2017-10-03 – 2017-10-04 (×2): via INTRAVENOUS

## 2017-10-03 MED ORDER — FLUTICASONE PROPIONATE 50 MCG/ACT NA SUSP
1.0000 | Freq: Every day | NASAL | Status: DC
  Administered 2017-10-03 – 2017-10-04 (×2): 1 via NASAL
  Filled 2017-10-03: qty 16

## 2017-10-03 MED ORDER — DOCUSATE SODIUM 100 MG PO CAPS
100.0000 mg | ORAL_CAPSULE | Freq: Two times a day (BID) | ORAL | Status: DC
  Administered 2017-10-03 – 2017-10-04 (×2): 100 mg via ORAL
  Filled 2017-10-03 (×2): qty 1

## 2017-10-03 MED ORDER — SPIRONOLACTONE 25 MG PO TABS
25.0000 mg | ORAL_TABLET | Freq: Every day | ORAL | Status: DC
  Administered 2017-10-03 – 2017-10-04 (×2): 25 mg via ORAL
  Filled 2017-10-03 (×2): qty 1

## 2017-10-03 MED ORDER — FOLIC ACID 1 MG PO TABS
1.0000 mg | ORAL_TABLET | Freq: Every day | ORAL | Status: DC
  Administered 2017-10-03 – 2017-10-04 (×2): 1 mg via ORAL
  Filled 2017-10-03 (×2): qty 1

## 2017-10-03 MED ORDER — HYDROCODONE-ACETAMINOPHEN 5-325 MG PO TABS
1.0000 | ORAL_TABLET | Freq: Once | ORAL | Status: AC
  Administered 2017-10-03: 1 via ORAL
  Filled 2017-10-03: qty 1

## 2017-10-03 MED ORDER — ATORVASTATIN CALCIUM 40 MG PO TABS
40.0000 mg | ORAL_TABLET | Freq: Every day | ORAL | Status: DC
  Administered 2017-10-03: 40 mg via ORAL
  Filled 2017-10-03: qty 1

## 2017-10-03 MED ORDER — BACLOFEN 10 MG PO TABS
10.0000 mg | ORAL_TABLET | Freq: Two times a day (BID) | ORAL | Status: DC
  Administered 2017-10-03 – 2017-10-04 (×2): 10 mg via ORAL
  Filled 2017-10-03 (×2): qty 1

## 2017-10-03 MED ORDER — SODIUM CHLORIDE 0.9 % IV SOLN
INTRAVENOUS | Status: DC
  Administered 2017-10-03: 11:00:00 via INTRAVENOUS

## 2017-10-03 MED ORDER — OXYBUTYNIN CHLORIDE 5 MG PO TABS
2.5000 mg | ORAL_TABLET | Freq: Two times a day (BID) | ORAL | Status: DC
  Administered 2017-10-03 – 2017-10-04 (×2): 2.5 mg via ORAL
  Filled 2017-10-03 (×2): qty 1

## 2017-10-03 MED ORDER — FERROUS SULFATE 325 (65 FE) MG PO TABS
325.0000 mg | ORAL_TABLET | Freq: Two times a day (BID) | ORAL | Status: DC
  Administered 2017-10-03 – 2017-10-04 (×2): 325 mg via ORAL
  Filled 2017-10-03 (×2): qty 1

## 2017-10-03 MED ORDER — ACETAMINOPHEN 325 MG PO TABS
650.0000 mg | ORAL_TABLET | Freq: Four times a day (QID) | ORAL | Status: DC | PRN
  Administered 2017-10-03: 650 mg via ORAL
  Filled 2017-10-03 (×2): qty 2

## 2017-10-03 MED ORDER — ONDANSETRON HCL 4 MG PO TABS: 4.0000 mg | ORAL_TABLET | Freq: Four times a day (QID) | ORAL | Status: DC | PRN

## 2017-10-03 MED ORDER — LEFLUNOMIDE 20 MG PO TABS
20.0000 mg | ORAL_TABLET | Freq: Every day | ORAL | Status: DC
  Administered 2017-10-04: 20 mg via ORAL
  Filled 2017-10-03 (×2): qty 1

## 2017-10-03 NOTE — ED Notes (Signed)
Husband now at bedside and reports pt has had rectal bleeding x couple of days, worse this morning. Pt denies any vomiting of blood.

## 2017-10-03 NOTE — Consult Note (Addendum)
Referring Provider: Dr. Gilford Raid  Primary Care Physician:  Fayrene Helper, MD Primary Gastroenterologist:  Dr. Gala Romney   Date of Admission: 10/03/17 Date of Consultation: 10/03/17  Reason for Consultation:  GI bleed   HPI:  Danielle Cruz is a 62 y.o. year old female last seen at [facility] in Nov 2016 for elevated ferritin and mildly elevated transaminases. Negative hemochromatosis.  Known fatty liver. Last colonoscopy in Aug 2010 with left-sided diverticula and pedunculated polyp (polypoid fragments of benign colonic mucosa, no adenomatous changes). Recently discharged [name] 10, 2019, after admission for right intertrochanteric femoral neck fracture s/p mechanical fall. Underwent repair 09/23/17 and discharged on lovenox for DVT prophylaxis. History of CVA 2010 with right-sided hemiparesis on aspirin.   Danielle Cruz, sister present with her at bedside. Husband states he has seen bright red blood on tissue with wiping. Pool of blood on pad and gown this morning. Denies rectal pain. No abdominal pain. No constipation. Appetite is better since discharge from hospital a few weeks ago. No significant unexplained weight loss. No dysphagia. No reflux issues.   Has motorized chair around the house. Used to ambulate with cane to bathroom prior to fall. Started PT last week.   Past Medical History:  Diagnosis Date  . AKI (acute kidney injury) ([place]) 09/22/2017  . Anemia   . 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 Arthritis   . Colonic polyp 2010   Hemorrhoids; h/o mild hematochezia  . CVA (cerebral infarction) 08/2008   sizable left -residual expressive aphasia, right sided weakness; ambulates with difficulty with the right leg brace   . Hyperlipidemia   . Hypertension 2005  . Rheumatoid arthritis(714.0)   . Stroke ([place])   . Tobacco abuse, in remission    Quit in 2010; 30-pack-year history    Past Surgical History:  Procedure Laterality  Date  . ANKLE SURGERY     Right  . CAROTID STENT INSERTION    . COLONOSCOPY W/ POLYPECTOMY  11/2008   Dr. Gala Romney. Left-sided diverticula, Pedunculated polyp snared (no adenomatous changes)  . FEMUR IM NAIL Right 09/23/2017   Procedure: INTRAMEDULLARY (IM) NAIL FEMORAL;  Surgeon: Renette Butters, MD;  Location: [place];  Service: Orthopedics;  Laterality: Right;  . OOPHORECTOMY      Prior to Admission medications   Medication Sig Start Date End Date Taking? Authorizing Provider  aspirin EC 81 MG tablet Take 81 mg by mouth daily.   Yes [provider]  atorvastatin (LIPITOR) 40 MG tablet Take 1 tablet (40 mg total) by mouth daily. 06/30/17 10/03/17 Yes Branch, Alphonse Guild, MD  baclofen (LIORESAL) 10 MG tablet Take 10 mg by mouth 2 (two) times daily.   Yes [provider]  bisacodyl (DULCOLAX) 10 MG suppository Place 1 suppository (10 mg total) rectally daily as needed for moderate constipation. 09/26/17  Yes Regalado, Belkys A, MD  docusate sodium (COLACE) 100 MG capsule Take 1 capsule (100 mg total) by mouth 2 (two) times daily. 09/26/17  Yes Regalado, Belkys A, MD  enoxaparin (LOVENOX) 40 MG/0.4ML injection Inject 0.4 mLs (40 mg total) into the skin daily for 30 doses. For 30 days post op for DVT prophylaxis 09/23/17 10/23/17 Yes Martensen, Charna Elizabeth III, PA-C  ferrous sulfate 325 (65 FE) MG tablet Take 1 tablet (325 mg total) by mouth 2 (two) times daily with a meal. 09/26/17  Yes Regalado, Belkys A, MD  fluticasone (FLONASE) 50  MCG/ACT nasal spray Place 1 spray into both nostrils daily. 07/14/17  Yes Fayrene Helper, MD  folic acid (FOLVITE) 1 MG tablet take one tablet daily 04/14/17  Yes [provider]  furosemide (LASIX) 40 MG tablet TAKE 1 TABLET DAILY AS NEEDED FOR SWELLING 09/27/17  Yes Branch, Alphonse Guild, MD  hydrochlorothiazide (HYDRODIURIL) 25 MG tablet Take 25 mg by mouth daily.   Yes [provider]  leflunomide (ARAVA) 20 MG tablet Take 20 mg by mouth  daily.     Yes [provider]  metoprolol succinate (TOPROL-XL) 25 MG 24 hr tablet Take 1 tablet (25 mg total) by mouth daily. 09/26/17  Yes Arnoldo Lenis, MD  montelukast (SINGULAIR) 10 MG tablet TAKE 1 TABLET(10 MG) BY MOUTH AT BEDTIME 09/26/17  Yes Fayrene Helper, MD  Multiple Vitamin (MULTIVITAMIN WITH MINERALS) TABS tablet Take 1 tablet by mouth daily.   Yes [provider]  oxybutynin (DITROPAN) 5 MG tablet TAKE 1/2 TABLET BY MOUTH TWICE DAILY 08/01/17  Yes Fayrene Helper, MD  polyethylene glycol (MIRALAX / GLYCOLAX) packet Take 17 g by mouth 2 (two) times daily. 09/26/17  Yes Regalado, Belkys A, MD  potassium chloride SA (K-DUR,KLOR-CON) 20 MEQ tablet TAKE 1 TABLET BY MOUTH EVERY DAY 07/11/17  Yes Branch, Alphonse Guild, MD  senna (SENOKOT) 8.6 MG TABS tablet Take 1 tablet (8.6 mg total) by mouth 2 (two) times daily. 09/26/17  Yes Regalado, Belkys A, MD  spironolactone (ALDACTONE) 25 MG tablet TAKE 1 TABLET(25 MG) BY MOUTH DAILY 07/08/17  Yes Branch, Alphonse Guild, MD    Current Facility-Administered Medications  Medication Dose Route Frequency Provider Last Rate Last Dose  . 0.9 %  sodium chloride infusion   Intravenous Continuous Isla Pence, MD 125 mL/hr at 10/03/17 1040    . pantoprazole (PROTONIX) 80 mg in sodium chloride 0.9 % 250 mL (0.32 mg/mL) infusion  8 mg/hr Intravenous Continuous Isla Pence, MD 25 mL/hr at 10/03/17 1111 8 mg/hr at 10/03/17 1111    Allergies as of 10/03/2017 - Review Complete 10/03/2017  Allergen Reaction Noted  . Ace inhibitors Cough 11/18/2014    Family History  Problem Relation Age of Onset  . Cancer Father        prostate, deceased 74s  . Breast cancer Sister        Deceased 48s  . Heart attack Mother        deceased 66, during childbirth  . Cancer Brother        lymphoma  . Colon cancer Maternal Aunt        older than age 88  . Liver disease Neg Hx     Social History   Socioeconomic History  . Marital  status: Married    Spouse name: Not on file  . Number of children: 3  . Years of education: 11th grade  . Highest education level: 11th grade  Occupational History  . Occupation: disabilty x 9years    Comment: secondary to arthritis   Social Needs  . Financial resource strain: Not very hard  . Food insecurity:    Worry: Sometimes true    Inability: Never true  . Transportation needs:    Medical: No    Non-medical: No  Tobacco Use  . Smoking status: Current Every Day Smoker    Packs/day: 0.50    Types: Cigarettes  . Smokeless tobacco: Never Used  . Tobacco comment: current 15  Substance and Sexual Activity  . Alcohol use: No  Alcohol/week: 0.0 oz    Comment: quit 6 years ago   . Drug use: No  . Sexual activity: Not on file  Lifestyle  . Physical activity:    Days per week: 0 days    Minutes per session: 0 min  . Stress: Not at all  Relationships  . Social connections:    Talks on phone: Once a week    Gets together: Not on file    Attends religious service: More than 4 times per year    Active member of club or organization: No    Attends meetings of clubs or organizations: Never    Relationship status: Married  . Intimate partner violence:    Fear of current or ex partner: No    Emotionally abused: No    Physically abused: No    Forced sexual activity: No  Other Topics Concern  . Not on file  Social History Narrative   Pt gave birth to 4 children, 1 deceased at 56 weeks was a premature baby, 3 are living ages range 64 to 71 all healthy     Review of Systems: Gen: Denies fever, chills, loss of appetite, change in weight or weight loss CV: Denies chest pain, heart palpitations, syncope, edema  Resp: Denies shortness of breath with rest, cough, wheezing GI: see HPI  GU : Denies urinary burning, urinary frequency, urinary incontinence.  MS: see HPI  Derm: Denies rash, itching, dry skin Psych: Denies depression, anxiety,confusion, or memory loss Heme: see  HPI.  Physical Exam: Vital signs in last 24 hours: Temp:  [97.8 F (36.6 C)] 97.8 F (36.6 C) (06/17 0922) Pulse Rate:  [79-95] 79 (06/17 1130) Resp:  [15-31] 15 (06/17 1130) BP: (177-196)/(77-98) 182/77 (06/17 1130) SpO2:  [99 %-100 %] 100 % (06/17 1130) Weight:  [148 lb (67.1 kg)] 148 lb (67.1 kg) (06/17 0918)   General:   Alert, oriented, unable to verbalize due to history of stroke but understands questions and nods head yes or no, sometimes will say simple "yes" when asked direct questions. Engaged in conversation with sister, Danielle Cruz, at bedside.  Head:  Normocephalic and atraumatic. Eyes:  Sclera clear, no icterus. Ears:  Normal auditory acuity. Nose:  No deformity, discharge,  or lesions. Mouth:  No deformity or lesions Lungs:  Clear throughout to auscultation.    Heart:  S1 S2 present without murmurs  Abdomen:  Soft, nontender and nondistended. No masses, hepatosplenomegaly or hernias noted. Normal bowel sounds, without guarding, and without rebound.   Rectal:  Moderately large non-thrombosed external hemorrhoids with what appears to be non-thrombosed prolapsednon-bleeding internal hemorrhoids easily reduced, internal exam without mass, no gross blood on exam, scant amount of stool [place]/green. No melena appreciated on exam. Notably has two areas of skin breakdown left and right buttocks, about quarter and nickel sized, with granulation tissue and scant blood in diaper obviously where tissue breakdown was located.  Msk:  Right-sided hemiparesis Extremities:  With right pedal/ankle edema slightly more prominent than left  Neurologic:  Alert and  oriented x4  Psych:  Alert and cooperative. Normal mood and affect.  Intake/Output from previous day: No intake/output data recorded. Intake/Output this shift: Total I/O In: 1100 [IV Piggyback:1100] Out: -   Lab Results: Recent Labs    10/03/17 0929  WBC 10.3  HGB 11.3*  HCT 34.5*  PLT 276   BMET Recent Labs     10/03/17 0929  NA 138  K 3.9  CL 103  CO2 24  GLUCOSE  109*  BUN 20  CREATININE 0.85  CALCIUM 9.1   LFT Recent Labs    10/03/17 0929  PROT 7.6  ALBUMIN 3.2*  AST 44*  ALT 57*  ALKPHOS 107  BILITOT 0.9   PT/INR Recent Labs    10/03/17 0929  LABPROT 14.7  INR 1.16    Studies/Results: Dg Chest 1 View  Result Date: 10/03/2017 CLINICAL DATA:  Rectal bleeding. History of stroke and hypertension. EXAM: CHEST  1 VIEW COMPARISON:  Radiographs 07/14/2017 and 12/16/2007. FINDINGS: 0938 hours. There are lower lung volumes with minimal resulting bibasilar atelectasis. No edema, confluent airspace opacity, pleural effusion or pneumothorax. The heart size and mediastinal contours are stable. No acute osseous findings are seen. Telemetry leads overlie the chest. IMPRESSION: No acute cardiopulmonary process.  Minimal bibasilar atelectasis. Electronically Signed   By: Richardean Sale M.D.   On: 10/03/2017 09:53    Impression: 62 year old female presenting with low-volume rectal bleeding described as bright red per husband, with "pool" of blood in pad and gown this morning. On Lovenox due to recent hip fracture s/p repair on 6/7. Rectal exam with non-thrombosed hemorrhoids and likely prolapsing internal hemorrhoids that were easily reduced on exam during consultation. No gross blood noted this afternoon per myself, and scant stool that was noted on glove was [place]. Unclear if there has been melena, as I asked the husband to clarify and he notes blood was bright red. Hgb is similar to discharge value from a week ago.   Clinically, seems to be benign anorectal source. Also notable are two areas of skin breakdown at buttocks that are also producing small amount of blood in pad. Discussed with nursing to monitor for any obvious melena or further overt GI bleeding. Will add anusol suppositories for now. Last colonoscopy 2010 with diverticula and benign polyp. Will need EGD if any evidence of melena or  concern for upper GI bleed. Would recommend conservative management for now until fully recovered from recent hip surgery as long as no significant GI bleeding or worsening anemia.   Plan: Will change PPI infusion to BID  Anusol suppositories BID Repeat CBC in am Monitor for overt GI bleeding Mobilize as tolerated: per ortho notes, weight-bearing as tolerated RLE. Avoid further skin breakdown and turn while in bed Will reassess tomorrow morning May have clear liquids  Annitta Needs, PhD, ANP-BC [facility]     LOS: 0 days    10/03/2017, 12:13 PM

## 2017-10-03 NOTE — ED Triage Notes (Signed)
Pt brought in by [facility] with c/o rectal bleeding for a few days per home health nurse to EMS. Pt has hx of stroke and speech is incomprehensible.

## 2017-10-03 NOTE — Plan of Care (Signed)
progressing 

## 2017-10-03 NOTE — ED Provider Notes (Signed)
[facility] EMERGENCY DEPARTMENT Provider Note   CSN: [id number] Arrival date & time: 10/03/17  6301     History   Chief Complaint Chief Complaint  Patient presents with  . Rectal Bleeding    HPI Danielle Cruz is a 62 y.o. female.  Pt presents to the ED today with rectal bleeding.  Pt has a hx of a stroke and is unable to give much hx.  Per EMS, she has had some rectal bleeding for the past few days.  She was admitted to [facility] for a fractured hip on 6/6.  She had the hip repaired on 6/9 and was d/c home on 6/10.  She was d/c on lovenox which she is still taking.  Family said they noticed some BRBPR yesterday and more today with some black stool.     Past Medical History:  Diagnosis Date  . AKI (acute kidney injury) ([place]) 09/22/2017  . Anemia   . 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 Arthritis   . Colonic polyp 2010   Hemorrhoids; h/o mild hematochezia  . CVA (cerebral infarction) 08/2008   sizable left -residual expressive aphasia, right sided weakness; ambulates with difficulty with the right leg brace   . Hyperlipidemia   . Hypertension 2005  . Rheumatoid arthritis(714.0)   . Stroke [facility])     Patient Active Problem List   Diagnosis Date Noted  . GI bleed 10/03/2017  . Intertrochanteric fracture of right hip ([place]) 09/25/2017  . Hip fracture ([place]) 09/22/2017  . AKI (acute kidney injury) ([place]) 09/22/2017  . At high risk for falls 01/31/2017  . Disorder of bone and cartilage 05/11/2016  . Normocytic anemia 01/08/2015  . Elevated ferritin 01/08/2015  . LUQ fullness 01/08/2015  . CAD in native artery 12/07/2014  . Bowel habit changes 12/07/2014  . Cough 12/07/2014  . Fasting hyperglycemia 11/05/2011  . Elevated transaminase level 01/08/2011  . Tobacco abuse   . Rheumatoid arthritis ([place])   . Colonic polyp   . 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 Cardiovascular  disease 01/14/2010  . Anemia 04/01/2009  . Hyperlipemia 11/14/2008  . Essential hypertension 11/14/2008    Past Surgical History:  Procedure Laterality Date  . ANKLE SURGERY     Right  . CAROTID STENT INSERTION    . COLONOSCOPY W/ POLYPECTOMY  11/2008   Dr. Gala Romney. Left-sided diverticula, Pedunculated polyp snared (no adenomatous changes)  . FEMUR IM NAIL Right 09/23/2017   Procedure: INTRAMEDULLARY (IM) NAIL FEMORAL;  Surgeon: Renette Butters, MD;  Location: [place];  Service: Orthopedics;  Laterality: Right;  . OOPHORECTOMY       OB History   None      Home Medications    Prior to Admission medications   Medication Sig Start Date End Date Taking? Authorizing Provider  aspirin EC 81 MG tablet Take 81 mg by mouth daily.   Yes [provider]  atorvastatin (LIPITOR) 40 MG tablet Take 1 tablet (40 mg total) by mouth daily. 06/30/17 10/03/17 Yes Branch, Alphonse Guild, MD  baclofen (LIORESAL) 10 MG tablet Take 10 mg by mouth 2 (two) times daily.   Yes [provider]  bisacodyl (DULCOLAX) 10 MG suppository Place 1 suppository (10 mg total) rectally daily as needed for moderate constipation. 09/26/17  Yes Regalado, Belkys A, MD  docusate sodium (COLACE) 100 MG capsule Take 1 capsule (100 mg total) by  mouth 2 (two) times daily. 09/26/17  Yes Regalado, Belkys A, MD  enoxaparin (LOVENOX) 40 MG/0.4ML injection Inject 0.4 mLs (40 mg total) into the skin daily for 30 doses. For 30 days post op for DVT prophylaxis 09/23/17 10/23/17 Yes Martensen, Charna Elizabeth III, PA-C  ferrous sulfate 325 (65 FE) MG tablet Take 1 tablet (325 mg total) by mouth 2 (two) times daily with a meal. 09/26/17  Yes Regalado, Belkys A, MD  fluticasone (FLONASE) 50 MCG/ACT nasal spray Place 1 spray into both nostrils daily. 07/14/17  Yes Fayrene Helper, MD  folic acid (FOLVITE) 1 MG tablet take one tablet daily 04/14/17  Yes [provider]  furosemide (LASIX) 40 MG tablet TAKE 1 TABLET DAILY AS NEEDED  FOR SWELLING 09/27/17  Yes Branch, Alphonse Guild, MD  hydrochlorothiazide (HYDRODIURIL) 25 MG tablet Take 25 mg by mouth daily.   Yes [provider]  leflunomide (ARAVA) 20 MG tablet Take 20 mg by mouth daily.     Yes [provider]  metoprolol succinate (TOPROL-XL) 25 MG 24 hr tablet Take 1 tablet (25 mg total) by mouth daily. 09/26/17  Yes Arnoldo Lenis, MD  montelukast (SINGULAIR) 10 MG tablet TAKE 1 TABLET(10 MG) BY MOUTH AT BEDTIME 09/26/17  Yes Fayrene Helper, MD  Multiple Vitamin (MULTIVITAMIN WITH MINERALS) TABS tablet Take 1 tablet by mouth daily.   Yes [provider]  oxybutynin (DITROPAN) 5 MG tablet TAKE 1/2 TABLET BY MOUTH TWICE DAILY 08/01/17  Yes Fayrene Helper, MD  polyethylene glycol (MIRALAX / GLYCOLAX) packet Take 17 g by mouth 2 (two) times daily. 09/26/17  Yes Regalado, Belkys A, MD  potassium chloride SA (K-DUR,KLOR-CON) 20 MEQ tablet TAKE 1 TABLET BY MOUTH EVERY DAY 07/11/17  Yes Branch, Alphonse Guild, MD  senna (SENOKOT) 8.6 MG TABS tablet Take 1 tablet (8.6 mg total) by mouth 2 (two) times daily. 09/26/17  Yes Regalado, Belkys A, MD  spironolactone (ALDACTONE) 25 MG tablet TAKE 1 TABLET(25 MG) BY MOUTH DAILY 07/08/17  Yes Branch, Alphonse Guild, MD    Family History Family History  Problem Relation Age of Onset  . Cancer Father        prostate, deceased 61s  . Breast cancer Sister        Deceased 54s  . Heart attack Mother        deceased 60, during childbirth  . Cancer Brother        lymphoma  . Colon cancer Maternal Aunt        older than age 42  . Liver disease Neg Hx     Social History Social History   Tobacco Use  . Smoking status: Current Every Day Smoker    Packs/day: 0.50    Types: Cigarettes  . Smokeless tobacco: Never Used  . Tobacco comment: current 15  Substance Use Topics  . Alcohol use: No    Alcohol/week: 0.0 oz    Comment: quit 6 years ago   . Drug use: No     Allergies   Ace inhibitors   Review of  Systems Review of Systems  Gastrointestinal: Positive for blood in stool.  All other systems reviewed and are negative.    Physical Exam Updated Vital Signs BP (!) 187/72 (BP Location: Left Arm)   Pulse 81   Temp 97.8 F (36.6 C) (Oral)   Resp 20   Ht 5\' 7"  (1.702 m)   Wt 67.9 kg (149 lb 11.1 oz)   SpO2 100%  BMI 23.45 kg/m   Physical Exam  Constitutional: She appears well-developed and well-nourished.  HENT:  Head: Normocephalic and atraumatic.  Right Ear: External ear normal.  Left Ear: External ear normal.  Nose: Nose normal.  Mouth/Throat: Oropharynx is clear and moist.  Eyes: Pupils are equal, round, and reactive to light. Conjunctivae and EOM are normal.  Neck: Normal range of motion. Neck supple.  Cardiovascular: Normal rate, regular rhythm, normal heart sounds and intact distal pulses.  Pulmonary/Chest: Effort normal and breath sounds normal.  Abdominal: Soft. Bowel sounds are normal.  Genitourinary: Rectal exam shows external hemorrhoid and guaiac positive stool.  Genitourinary Comments: Stool is dark  Musculoskeletal:  Surgical wound right leg looks good  Neurological: She is alert.  Chronic RUE contractures and weakness in RLE.  Chronic right sided facial droop.  Chronic dysarthria, so unable to assess orientation.    Skin: Skin is warm. Capillary refill takes less than 2 seconds.  Nursing note and vitals reviewed.    ED Treatments / Results  Labs (all labs ordered are listed, but only abnormal results are displayed) Labs Reviewed  COMPREHENSIVE METABOLIC PANEL - Abnormal; Notable for the following components:      Result Value   Glucose, Bld 109 (*)    Albumin 3.2 (*)    AST 44 (*)    ALT 57 (*)    All other components within normal limits  CBC WITH DIFFERENTIAL/PLATELET - Abnormal; Notable for the following components:   RBC 3.50 (*)    Hemoglobin 11.3 (*)    HCT 34.5 (*)    RDW 16.2 (*)    Monocytes Absolute 1.3 (*)    All other components  within normal limits  POC OCCULT BLOOD, ED - Abnormal; Notable for the following components:   Fecal Occult Bld POSITIVE (*)    All other components within normal limits  PROTIME-INR  TYPE AND SCREEN    EKG EKG Interpretation  Date/Time:  Monday Madie 17 2019 09:25:01 EDT Ventricular Rate:  92 PR Interval:    QRS Duration: 92 QT Interval:  374 QTC Calculation: 463 R Axis:   3 Text Interpretation:  Sinus rhythm Consider right atrial enlargement No significant change since last tracing Confirmed by Isla Pence [phone]) on 10/03/2017 9:50:03 AM   Radiology Dg Chest 1 View  Result Date: 10/03/2017 CLINICAL DATA:  Rectal bleeding. History of stroke and hypertension. EXAM: CHEST  1 VIEW COMPARISON:  Radiographs 07/14/2017 and 12/16/2007. FINDINGS: 0938 hours. There are lower lung volumes with minimal resulting bibasilar atelectasis. No edema, confluent airspace opacity, pleural effusion or pneumothorax. The heart size and mediastinal contours are stable. No acute osseous findings are seen. Telemetry leads overlie the chest. IMPRESSION: No acute cardiopulmonary process.  Minimal bibasilar atelectasis. Electronically Signed   By: Richardean Sale M.D.   On: 10/03/2017 09:53    Procedures Procedures (including critical care time)  Medications Ordered in ED Medications  sodium chloride 0.9 % bolus 1,000 mL (0 mLs Intravenous Stopped 10/03/17 1040)    And  0.9 %  sodium chloride infusion ( Intravenous New Bag/Given 10/03/17 1040)  pantoprazole (PROTONIX) 80 mg in sodium chloride 0.9 % 250 mL (0.32 mg/mL) infusion (8 mg/hr Intravenous New Bag/Given 10/03/17 1111)  hydrocortisone (ANUSOL-HC) suppository 25 mg (has no administration in time range)  pantoprazole (PROTONIX) 80 mg in sodium chloride 0.9 % 100 mL IVPB (0 mg Intravenous Stopped 10/03/17 1115)  HYDROcodone-acetaminophen (NORCO/VICODIN) 5-325 MG per tablet 1 tablet (1 tablet Oral Given 10/03/17 1054)  Initial Impression /  Assessment and Plan / ED Course  I have reviewed the triage vital signs and the nursing notes.  Pertinent labs & imaging results that were available during my care of the patient were reviewed by me and considered in my medical decision making (see chart for details).   Hgb is stable.  Vitals are stable.  Pt d/w Dr. Jerilee Hoh ([facility]) for admission.  Pt also d/w Dr. Gala Romney (GI) who will see pt in consult. Final Clinical Impressions(s) / ED Diagnoses   Final diagnoses:  GI bleed    ED Discharge Orders    None       Isla Pence, MD 10/03/17 1545

## 2017-10-03 NOTE — H&P (Signed)
History and Physical    Danielle Cruz KNL:[id number] DOB: Jan 09, 1956 DOA: 10/03/2017  Referring MD/NP/PA: Isla Pence, EDP PCP: Fayrene Helper, MD  Patient coming from: Home  Chief Complaint:  bright red blood per rectum  HPI: Danielle Cruz is a 62 y.o. female who suffered a stroke in 2010 and has right hemiparesis since, who suffered a right intertrochanteric hip fracture status post repair on 09/22/2017.  She was discharged on Lovenox.  Yesterday her husband noticed that on her pad she had a small amount of blood.  This morning there was a "pool of blood" on the pad and because he was told to watch out for bleeding while on Lovenox he brought her into the hospital for evaluation.  In the emergency department she is found to be hemodynamically stable, was started on IV Protonix, hemoglobin was 11.3 with this consistent with her baseline from a week ago.  GI was consulted and admission has been requested for further evaluation and management.  Past Medical/Surgical History: Past Medical History:  Diagnosis Date  . AKI (acute kidney injury) ([place]) 09/22/2017  . Anemia   . 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 Arthritis   . Colonic polyp 2010   Hemorrhoids; h/o mild hematochezia  . CVA (cerebral infarction) 08/2008   sizable left -residual expressive aphasia, right sided weakness; ambulates with difficulty with the right leg brace   . Hyperlipidemia   . Hypertension 2005  . Rheumatoid arthritis(714.0)   . Stroke [facility])     Past Surgical History:  Procedure Laterality Date  . ANKLE SURGERY     Right  . CAROTID STENT INSERTION    . COLONOSCOPY W/ POLYPECTOMY  11/2008   Dr. Gala Romney. Left-sided diverticula, Pedunculated polyp snared (no adenomatous changes)  . FEMUR IM NAIL Right 09/23/2017   Procedure: INTRAMEDULLARY (IM) NAIL FEMORAL;  Surgeon: Renette Butters, MD;  Location: [place];  Service: Orthopedics;   Laterality: Right;  . OOPHORECTOMY      Social History:  reports that she has been smoking cigarettes.  She has been smoking about 0.50 packs per day. She has never used smokeless tobacco. She reports that she does not drink alcohol or use drugs.  Allergies: Allergies  Allergen Reactions  . Ace Inhibitors Cough    New daily cough since starting ACE    Family History:  Family History  Problem Relation Age of Onset  . Cancer Father        prostate, deceased 26s  . Breast cancer Sister        Deceased 72s  . Heart attack Mother        deceased 41, during childbirth  . Cancer Brother        lymphoma  . Colon cancer Maternal Aunt        older than age 54  . Liver disease Neg Hx     Prior to Admission medications   Medication Sig Start Date End Date Taking? Authorizing Provider  aspirin EC 81 MG tablet Take 81 mg by mouth daily.   Yes [provider]  atorvastatin (LIPITOR) 40 MG tablet Take 1 tablet (40 mg total) by mouth daily. 06/30/17 10/03/17 Yes Branch, Alphonse Guild, MD  baclofen (LIORESAL) 10 MG tablet Take 10 mg by mouth 2 (two) times daily.   Yes [provider]  bisacodyl (DULCOLAX) 10 MG suppository Place 1 suppository (10 mg total) rectally  daily as needed for moderate constipation. 09/26/17  Yes Regalado, Belkys A, MD  docusate sodium (COLACE) 100 MG capsule Take 1 capsule (100 mg total) by mouth 2 (two) times daily. 09/26/17  Yes Regalado, Belkys A, MD  enoxaparin (LOVENOX) 40 MG/0.4ML injection Inject 0.4 mLs (40 mg total) into the skin daily for 30 doses. For 30 days post op for DVT prophylaxis 09/23/17 10/23/17 Yes Martensen, Charna Elizabeth III, PA-C  ferrous sulfate 325 (65 FE) MG tablet Take 1 tablet (325 mg total) by mouth 2 (two) times daily with a meal. 09/26/17  Yes Regalado, Belkys A, MD  fluticasone (FLONASE) 50 MCG/ACT nasal spray Place 1 spray into both nostrils daily. 07/14/17  Yes Fayrene Helper, MD  folic acid (FOLVITE) 1 MG tablet take one  tablet daily 04/14/17  Yes [provider]  furosemide (LASIX) 40 MG tablet TAKE 1 TABLET DAILY AS NEEDED FOR SWELLING 09/27/17  Yes Branch, Alphonse Guild, MD  hydrochlorothiazide (HYDRODIURIL) 25 MG tablet Take 25 mg by mouth daily.   Yes [provider]  leflunomide (ARAVA) 20 MG tablet Take 20 mg by mouth daily.     Yes [provider]  metoprolol succinate (TOPROL-XL) 25 MG 24 hr tablet Take 1 tablet (25 mg total) by mouth daily. 09/26/17  Yes Arnoldo Lenis, MD  montelukast (SINGULAIR) 10 MG tablet TAKE 1 TABLET(10 MG) BY MOUTH AT BEDTIME 09/26/17  Yes Fayrene Helper, MD  Multiple Vitamin (MULTIVITAMIN WITH MINERALS) TABS tablet Take 1 tablet by mouth daily.   Yes [provider]  oxybutynin (DITROPAN) 5 MG tablet TAKE 1/2 TABLET BY MOUTH TWICE DAILY 08/01/17  Yes Fayrene Helper, MD  polyethylene glycol (MIRALAX / GLYCOLAX) packet Take 17 g by mouth 2 (two) times daily. 09/26/17  Yes Regalado, Belkys A, MD  potassium chloride SA (K-DUR,KLOR-CON) 20 MEQ tablet TAKE 1 TABLET BY MOUTH EVERY DAY 07/11/17  Yes Branch, Alphonse Guild, MD  senna (SENOKOT) 8.6 MG TABS tablet Take 1 tablet (8.6 mg total) by mouth 2 (two) times daily. 09/26/17  Yes Regalado, Belkys A, MD  spironolactone (ALDACTONE) 25 MG tablet TAKE 1 TABLET(25 MG) BY MOUTH DAILY 07/08/17  Yes Branch, Alphonse Guild, MD    Review of Systems:  Constitutional: Denies fever, chills, diaphoresis, appetite change and fatigue.  HEENT: Denies photophobia, eye pain, redness, hearing loss, ear pain, congestion, sore throat, rhinorrhea, sneezing, mouth sores, trouble swallowing, neck pain, neck stiffness and tinnitus.   Respiratory: Denies SOB, DOE, cough, chest tightness,  and wheezing.   Cardiovascular: Denies chest pain, palpitations and leg swelling.  Gastrointestinal: Denies nausea, vomiting, abdominal pain, diarrhea, constipation,  and abdominal distention.  Genitourinary: Denies dysuria, urgency, frequency,  hematuria, flank pain and difficulty urinating.  Endocrine: Denies: hot or cold intolerance, sweats, changes in hair or nails, polyuria, polydipsia. Musculoskeletal: Denies myalgias, back pain, joint swelling, arthralgias and gait problem.  Skin: Denies pallor, rash and wound.  Neurological: Denies dizziness, seizures, syncope, weakness, light-headedness, numbness and headaches.  Hematological: Denies adenopathy. Easy bruising, personal or family bleeding history  Psychiatric/Behavioral: Denies suicidal ideation, mood changes, confusion, nervousness, sleep disturbance and agitation    Physical Exam: Vitals:   10/03/17 1030 10/03/17 1100 10/03/17 1130 10/03/17 1230  BP: (!) 195/79 (!) 194/83 (!) 182/77 (!) 187/72  Pulse: 87 81 79 81  Resp: (!) 25 16 15 20   Temp:    97.8 F (36.6 C)  TempSrc:    Oral  SpO2: 100% 100% 100% 100%  Weight:  67.9 kg (149 lb 11.1 oz)  Height:    5\' 7"  (1.702 m)     Constitutional: NAD, calm, comfortable Eyes: PERRL, lids and conjunctivae normal ENMT: Mucous membranes are moist. Posterior pharynx clear of any exudate or lesions.Normal dentition.  Neck: normal, supple, no masses, no thyromegaly Respiratory: clear to auscultation bilaterally, no wheezing, no crackles. Normal respiratory effort. No accessory muscle use.  Cardiovascular: Regular rate and rhythm, no murmurs / rubs / gallops. No extremity edema. 2+ pedal pulses. No carotid bruits.  Abdomen: no tenderness, no masses palpated. No hepatosplenomegaly. Bowel sounds positive.  Musculoskeletal: no clubbing / cyanosis. No joint deformity upper and lower extremities. Good ROM, no contractures. Normal muscle tone.  Skin: no rashes, lesions, ulcers. No induration Neurologic: CN 2-12 grossly intact. Sensation intact, DTR normal. Strength 5/5 in all 4.  Psychiatric: Normal judgment and insight. Alert and oriented x 3. Normal mood.    Labs on Admission: I have personally reviewed the following labs and  imaging studies  CBC: Recent Labs  Lab 10/03/17 0929  WBC 10.3  NEUTROABS 7.6  HGB 11.3*  HCT 34.5*  MCV 98.6  PLT 762   Basic Metabolic Panel: Recent Labs  Lab 10/03/17 0929  NA 138  K 3.9  CL 103  CO2 24  GLUCOSE 109*  BUN 20  CREATININE 0.85  CALCIUM 9.1   GFR: Estimated Creatinine Clearance: 66.7 mL/min (by C-G formula based on SCr of 0.85 mg/dL). Liver Function Tests: Recent Labs  Lab 10/03/17 0929  AST 44*  ALT 57*  ALKPHOS 107  BILITOT 0.9  PROT 7.6  ALBUMIN 3.2*   No results for input(s): LIPASE, AMYLASE in the last 168 hours. No results for input(s): AMMONIA in the last 168 hours. Coagulation Profile: Recent Labs  Lab 10/03/17 0929  INR 1.16   Cardiac Enzymes: No results for input(s): CKTOTAL, CKMB, CKMBINDEX, TROPONINI in the last 168 hours. BNP (last 3 results) No results for input(s): PROBNP in the last 8760 hours. HbA1C: No results for input(s): HGBA1C in the last 72 hours. CBG: No results for input(s): GLUCAP in the last 168 hours. Lipid Profile: No results for input(s): CHOL, HDL, LDLCALC, TRIG, CHOLHDL, LDLDIRECT in the last 72 hours. Thyroid Function Tests: No results for input(s): TSH, T4TOTAL, FREET4, T3FREE, THYROIDAB in the last 72 hours. Anemia Panel: No results for input(s): VITAMINB12, FOLATE, FERRITIN, TIBC, IRON, RETICCTPCT in the last 72 hours. Urine analysis:    Component Value Date/Time   COLORURINE YELLOW 04/28/2009 0825   APPEARANCEUR CLEAR 04/28/2009 0825   LABSPEC 1.020 04/28/2009 0825   PHURINE 6.5 04/28/2009 0825   GLUCOSEU NEGATIVE 04/28/2009 0825   HGBUR NEGATIVE 04/28/2009 0825   BILIRUBINUR NEGATIVE 04/28/2009 0825   KETONESUR NEGATIVE 04/28/2009 0825   PROTEINUR NEGATIVE 04/28/2009 0825   UROBILINOGEN 0.2 04/28/2009 0825   NITRITE NEGATIVE 04/28/2009 0825   LEUKOCYTESUR  04/28/2009 0825    NEGATIVE MICROSCOPIC NOT DONE ON URINES WITH NEGATIVE PROTEIN, BLOOD, LEUKOCYTES, NITRITE, OR GLUCOSE <1000 mg/dL.    Sepsis Labs: @LABRCNTIP (procalcitonin:4,lacticidven:4) )No results found for this or any previous visit (from the past 240 hour(s)).   Radiological Exams on Admission: Dg Chest 1 View  Result Date: 10/03/2017 CLINICAL DATA:  Rectal bleeding. History of stroke and hypertension. EXAM: CHEST  1 VIEW COMPARISON:  Radiographs 07/14/2017 and 12/16/2007. FINDINGS: 0938 hours. There are lower lung volumes with minimal resulting bibasilar atelectasis. No edema, confluent airspace opacity, pleural effusion or pneumothorax. The heart size and mediastinal contours are stable. No  acute osseous findings are seen. Telemetry leads overlie the chest. IMPRESSION: No acute cardiopulmonary process.  Minimal bibasilar atelectasis. Electronically Signed   By: Richardean Sale M.D.   On: 10/03/2017 09:53    EKG: Independently reviewed.  Sinus rhythm with no acute ischemic changes  Assessment/Plan Principal Problem:   GI bleed Active Problems:   Hyperlipemia   Essential hypertension   Hemiplegia, late effect of cerebrovascular disease (HCC)   Intertrochanteric fracture of right hip (HCC)    GI bleed, presumed lower -Appears mild. -GI has been consulted and plans on observation only at this point. -Monitor hemoglobin but doubt will need transfusion this admission. -She does have hemorrhoids, Anusol has been ordered. -Continue once daily PPI. -We will defer to GI whether okay to resume Lovenox; given high prevalence of post hip fracture DVT/PE, as long as patient does not have recurrence of significant bleeding, I would prefer to restart Lovenox.  Hyperlipidemia -Continue statin  Hypertension -BP elevated, hope for improvement now that antihypertensives will be restarted.  History of CVA -Stable, aspirin on hold.   DVT prophylaxis: SCDs Code Status: Full code Family Communication: 2 sisters at bedside updated on plan of care and all questions answered Disposition Plan: Anticipate discharge home  in 24 hours Consults called: GI Admission status: It is my clinical opinion that referral for OBSERVATION is reasonable and necessary in this patient based on the above information provided. The aforementioned taken together are felt to place the patient at high risk for further clinical deterioration. However it is anticipated that the patient may be medically stable for discharge from the hospital within 24 to 48 hours.      Time Spent: 85 minutes  Estela Isaac Bliss MD [facility] Pager [phone]  If 7PM-7AM, please contact night-coverage [email or web address] Password [facility]  10/03/2017, 5:56 PM

## 2017-10-04 ENCOUNTER — Ambulatory Visit: Payer: Medicare Other | Admitting: Family Medicine

## 2017-10-04 DIAGNOSIS — I10 Essential (primary) hypertension: Secondary | ICD-10-CM | POA: Diagnosis not present

## 2017-10-04 DIAGNOSIS — K625 Hemorrhage of anus and rectum: Secondary | ICD-10-CM | POA: Diagnosis not present

## 2017-10-04 DIAGNOSIS — K921 Melena: Secondary | ICD-10-CM | POA: Diagnosis not present

## 2017-10-04 LAB — BASIC METABOLIC PANEL
Anion gap: 5 (ref 5–15)
BUN: 11 mg/dL (ref 6–20)
CO2: 22 mmol/L (ref 22–32)
Calcium: 8 mg/dL — ABNORMAL LOW (ref 8.9–10.3)
Chloride: 112 mmol/L — ABNORMAL HIGH (ref 101–111)
Creatinine, Ser: 0.62 mg/dL (ref 0.44–1.00)
GFR calc Af Amer: >60 mL/min (ref >60)
GFR calc non Af Amer: >60 mL/min (ref >60)
Glucose, Bld: 95 mg/dL (ref 65–99)
Potassium: 3.7 mmol/L (ref 3.5–5.1)
Sodium: 139 mmol/L (ref 135–145)

## 2017-10-04 LAB — CBC
HCT: 30.6 % — ABNORMAL LOW (ref 36.0–46.0)
Hemoglobin: 10 g/dL — ABNORMAL LOW (ref 12.0–15.0)
MCH: 32.6 pg (ref 26.0–34.0)
MCHC: 32.7 g/dL (ref 30.0–36.0)
MCV: 99.7 fL (ref 78.0–100.0)
Platelets: 249 10*3/uL (ref 150–400)
RBC: 3.07 MIL/uL — ABNORMAL LOW (ref 3.87–5.11)
RDW: 16.2 % — ABNORMAL HIGH (ref 11.5–15.5)
WBC: 8 10*3/uL (ref 4.0–10.5)

## 2017-10-04 MED ORDER — HYDROCORTISONE ACETATE 25 MG RE SUPP: 25.0000 mg | Freq: Two times a day (BID) | RECTAL | 0 refills | Status: DC

## 2017-10-04 MED ORDER — PANTOPRAZOLE SODIUM 40 MG PO TBEC: 40.0000 mg | DELAYED_RELEASE_TABLET | Freq: Every day | ORAL | 2 refills | Status: DC

## 2017-10-04 NOTE — Discharge Summary (Signed)
Physician Discharge Summary  Danielle Cruz JEH:[id number] DOB: 1955/10/26 DOA: 10/03/2017  PCP: Fayrene Helper, MD  Admit date: 10/03/2017 Discharge date: 10/04/2017  Time spent: 45 minutes  Recommendations for Outpatient Follow-up:  -Will be discharged home today. -Advised follow up with PCP in 2 weeks. -GI will arrange follow up as well to discuss possible need for colonoscopy once off anticoagulation.   Discharge Diagnoses:  Principal Problem:   GI bleed Active Problems:   Hyperlipemia   Essential hypertension   Hemiplegia, late effect of cerebrovascular disease ([place])   Intertrochanteric fracture of right hip ([place])   Hematochezia   Discharge Condition: Stable and improved  Filed Weights   10/03/17 0918 10/03/17 1230  Weight: 67.1 kg (148 lb) 67.9 kg (149 lb 11.1 oz)    History of present illness:  Danielle Cruz is a 62 y.o. female who suffered a stroke in 2010 and has right hemiparesis since, who suffered a right intertrochanteric hip fracture status post repair on 09/22/2017.  She was discharged on Lovenox.  Yesterday her husband noticed that on her pad she had a small amount of blood.  This morning there was a "pool of blood" on the pad and because he was told to watch out for bleeding while on Lovenox he brought her into the hospital for evaluation.  In the emergency department she is found to be hemodynamically stable, was started on IV Protonix, hemoglobin was 11.3 with this consistent with her baseline from a week ago.  GI was consulted and admission has been requested for further evaluation and management.    Hospital Course:   Bright red blood per rectum -Self-limited, no further episodes since admission. -Suspect hemorrhoidal bleed. -Continue Anusol suppositories. -Per GI okay to resume Lovenox for remainder of 30 days for post hip DVT prophylaxis. -We will need to follow-up with GI in the office once completes treatment with Lovenox to determine if  colonoscopy will be required.  Hyperlipidemia -Continue statin  Hypertension -BP with fair control. Continue to monitor in the OP setting.  History of CVA -Stable, aspirin on hold.    Procedures:  None   Consultations:  GI  Discharge Instructions  Discharge Instructions    Diet - low sodium heart healthy   Complete by:  As directed    Increase activity slowly   Complete by:  As directed      Allergies as of 10/04/2017      Reactions   Ace Inhibitors Cough   New daily cough since starting ACE      Medication List    STOP taking these medications   bisacodyl 10 MG suppository Commonly known as:  DULCOLAX     TAKE these medications   aspirin EC 81 MG tablet Take 81 mg by mouth daily.   atorvastatin 40 MG tablet Commonly known as:  LIPITOR Take 1 tablet (40 mg total) by mouth daily.   baclofen 10 MG tablet Commonly known as:  LIORESAL Take 10 mg by mouth 2 (two) times daily.   docusate sodium 100 MG capsule Commonly known as:  COLACE Take 1 capsule (100 mg total) by mouth 2 (two) times daily.   enoxaparin 40 MG/0.4ML injection Commonly known as:  LOVENOX Inject 0.4 mLs (40 mg total) into the skin daily for 30 doses. For 30 days post op for DVT prophylaxis   ferrous sulfate 325 (65 FE) MG tablet Take 1 tablet (325 mg total) by mouth 2 (two) times daily with a meal.  fluticasone 50 MCG/ACT nasal spray Commonly known as:  FLONASE Place 1 spray into both nostrils daily.   folic acid 1 MG tablet Commonly known as:  FOLVITE take one tablet daily   furosemide 40 MG tablet Commonly known as:  LASIX TAKE 1 TABLET DAILY AS NEEDED FOR SWELLING   hydrochlorothiazide 25 MG tablet Commonly known as:  HYDRODIURIL Take 25 mg by mouth daily.   hydrocortisone 25 MG suppository Commonly known as:  ANUSOL-HC Place 1 suppository (25 mg total) rectally 2 (two) times daily.   leflunomide 20 MG tablet Commonly known as:  ARAVA Take 20 mg by mouth daily.    metoprolol succinate 25 MG 24 hr tablet Commonly known as:  TOPROL-XL Take 1 tablet (25 mg total) by mouth daily.   montelukast 10 MG tablet Commonly known as:  SINGULAIR TAKE 1 TABLET(10 MG) BY MOUTH AT BEDTIME   multivitamin with minerals Tabs tablet Take 1 tablet by mouth daily.   oxybutynin 5 MG tablet Commonly known as:  DITROPAN TAKE 1/2 TABLET BY MOUTH TWICE DAILY   pantoprazole 40 MG tablet Commonly known as:  PROTONIX Take 1 tablet (40 mg total) by mouth daily. Start taking on:  10/05/2017   polyethylene glycol packet Commonly known as:  MIRALAX / GLYCOLAX Take 17 g by mouth 2 (two) times daily.   potassium chloride SA 20 MEQ tablet Commonly known as:  K-DUR,KLOR-CON TAKE 1 TABLET BY MOUTH EVERY DAY   senna 8.6 MG Tabs tablet Commonly known as:  SENOKOT Take 1 tablet (8.6 mg total) by mouth 2 (two) times daily.   spironolactone 25 MG tablet Commonly known as:  ALDACTONE TAKE 1 TABLET(25 MG) BY MOUTH DAILY      Allergies  Allergen Reactions  . Ace Inhibitors Cough    New daily cough since starting ACE   Follow-up Information    Fayrene Helper, MD. Schedule an appointment as soon as possible for a visit in 2 week(s).   Specialty:  Family Medicine Contact information: [address] [address] [phone]        Annitta Needs, NP Follow up.   Specialty:  Gastroenterology Why:  office will call with appointment Contact information: [address] [place] [address] [phone]            The results of significant diagnostics from this hospitalization (including imaging, microbiology, ancillary and laboratory) are listed below for reference.    Significant Diagnostic Studies: Dg Chest 1 View  Result Date: 10/03/2017 CLINICAL DATA:  Rectal bleeding. History of stroke and hypertension. EXAM: CHEST  1 VIEW COMPARISON:  Radiographs 07/14/2017 and 12/16/2007. FINDINGS: 0938 hours. There are lower lung volumes  with minimal resulting bibasilar atelectasis. No edema, confluent airspace opacity, pleural effusion or pneumothorax. The heart size and mediastinal contours are stable. No acute osseous findings are seen. Telemetry leads overlie the chest. IMPRESSION: No acute cardiopulmonary process.  Minimal bibasilar atelectasis. Electronically Signed   By: Richardean Sale M.D.   On: 10/03/2017 09:53   Ct Head Wo Contrast  Result Date: 09/23/2017 CLINICAL DATA:  62 year old female admitted after a fall. Increased headache. EXAM: CT HEAD WITHOUT CONTRAST TECHNIQUE: Contiguous axial images were obtained from the base of the skull through the vertex without intravenous contrast. COMPARISON:  Head CT without contrast 04/28/2009, brain MRI 03/28/2009. FINDINGS: Brain: Chronic large left MCA territory infarct with encephalomalacia and gliosis. There is a small new area of encephalomalacia compared to the 2011 CT along the  para landed cortex seen on series 3, image 22 today. Increased regional white matter gliosis at that level as well. However, these have a chronic appearance. No associated hemorrhage or mass effect. Stable mild ex vacuo enlargement of the left lateral ventricle. No acute cortically based infarct identified. Right hemisphere gray-white matter differentiation remains normal. Vascular: Calcified atherosclerosis at the skull base. No suspicious intracranial vascular hyperdensity. Skull: Stable.  No acute osseous abnormality identified. Sinuses/Orbits: Visualized paranasal sinuses and mastoids are stable and well pneumatized. Other: Visualized orbits and scalp soft tissues are within normal limits. IMPRESSION: 1. Some progression of the chronic left MCA infarct since the 2011 CT, but with a chronic appearance. 2. No acute cortically based infarct, acute intracranial hemorrhage, or other acute intracranial abnormality identified. 3. These results were communicated to Dr. Niel Hummer at 1322 hours on 6/7/2019by text  page via the [facility] messaging system. Electronically Signed   By: Genevie Ann M.D.   On: 09/23/2017 13:27   Dg C-arm 1-60 Min  Result Date: 09/23/2017 CLINICAL DATA:  Right intramedullary nail placement for a right femoral intertrochanteric fracture. EXAM: RIGHT FEMUR 2 VIEWS; DG C-ARM 61-120 MIN COMPARISON:  Yesterday. FINDINGS: Four C-arm views of the right hip demonstrate placement of an intramedullary rod and compression screw bridging the previously demonstrated right intertrochanteric fracture. Normal alignment following hardware placement. No additional fractures are visualized. IMPRESSION: Hardware fixation of the previously demonstrated right intertrochanteric fracture. Electronically Signed   By: Claudie Revering M.D.   On: 09/23/2017 15:45   Dg Hip Unilat  With Pelvis 2-3 Views Right  Result Date: 09/22/2017 CLINICAL DATA:  Fall.  Hip pain. EXAM: DG HIP (WITH OR WITHOUT PELVIS) 2-3V RIGHT COMPARISON:  None. FINDINGS: Bones are diffusely demineralized. Intertrochanteric right femoral neck fracture evident with varus angulation. Degenerative changes are seen at the symphysis pubis. IMPRESSION: Right intertrochanteric femoral neck fracture with varus angulation. Electronically Signed   By: Misty Stanley M.D.   On: 09/22/2017 12:50   Dg Femur, Min 2 Views Right  Result Date: 09/23/2017 CLINICAL DATA:  Right intramedullary nail placement for a right femoral intertrochanteric fracture. EXAM: RIGHT FEMUR 2 VIEWS; DG C-ARM 61-120 MIN COMPARISON:  Yesterday. FINDINGS: Four C-arm views of the right hip demonstrate placement of an intramedullary rod and compression screw bridging the previously demonstrated right intertrochanteric fracture. Normal alignment following hardware placement. No additional fractures are visualized. IMPRESSION: Hardware fixation of the previously demonstrated right intertrochanteric fracture. Electronically Signed   By: Claudie Revering M.D.   On: 09/23/2017 15:45    Microbiology: No  results found for this or any previous visit (from the past 240 hour(s)).   Labs: Basic Metabolic Panel: Recent Labs  Lab 10/03/17 0929 10/04/17 0550  NA 138 139  K 3.9 3.7  CL 103 112*  CO2 24 22  GLUCOSE 109* 95  BUN 20 11  CREATININE 0.85 0.62  CALCIUM 9.1 8.0*   Liver Function Tests: Recent Labs  Lab 10/03/17 0929  AST 44*  ALT 57*  ALKPHOS 107  BILITOT 0.9  PROT 7.6  ALBUMIN 3.2*   No results for input(s): LIPASE, AMYLASE in the last 168 hours. No results for input(s): AMMONIA in the last 168 hours. CBC: Recent Labs  Lab 10/03/17 0929 10/04/17 0550  WBC 10.3 8.0  NEUTROABS 7.6  --   HGB 11.3* 10.0*  HCT 34.5* 30.6*  MCV 98.6 99.7  PLT 276 249   Cardiac Enzymes: No results for input(s): CKTOTAL, CKMB, CKMBINDEX, TROPONINI in the  last 168 hours. BNP: BNP (last 3 results) No results for input(s): BNP in the last 8760 hours.  ProBNP (last 3 results) No results for input(s): PROBNP in the last 8760 hours.  CBG: No results for input(s): GLUCAP in the last 168 hours.     Signed:  Lelon Frohlich  [facility] Pager: [phone] 10/04/2017, 12:10 PM

## 2017-10-04 NOTE — Care Management Obs Status (Signed)
[place] NOTIFICATION   Patient Details  Name: Danielle Cruz MRN: [id number] Date of Birth: 09/21/1955   Medicare Observation Status Notification Given:  Yes    Sherald Barge, RN 10/04/2017, 12:52 PM

## 2017-10-04 NOTE — Progress Notes (Signed)
[facility]  Patient Status: Active (receiving services up to time of hospitalization)    [facility] is providing the following services: RN, PT and OT  If patient discharges after hours, please call [phone].   [place] 10/04/2017, 12:03 PM

## 2017-10-04 NOTE — Progress Notes (Signed)
    Subjective: No further bleeding. No abdominal pain, N/V. Tolerating diet.   Objective: Vital signs in last 24 hours: Temp:  [97.8 F (36.6 C)-98.7 F (37.1 C)] 98.3 F (36.8 C) (06/18 0552) Pulse Rate:  [78-176] 78 (06/18 0600) Resp:  [15-25] 20 (06/17 1230) BP: (159-195)/(72-83) 173/82 (06/18 0600) SpO2:  [100 %] 100 % (06/18 0600) Weight:  [149 lb 11.1 oz (67.9 kg)] 149 lb 11.1 oz (67.9 kg) (06/17 1230) Last BM Date: 10/03/17 General:   Alert and oriented, pleasant, non-verbal but nods head to questions Head:  Normocephalic and atraumatic. Eyes:  No icterus, sclera clear. Conjuctiva pink.  Abdomen:  Bowel sounds present, soft, non-tender, non-distended. No HSM or hernias noted. No rebound or guarding. No masses appreciated  Msk:  Right-sided hemiparesis  Extremities:  Without  edema. Neurologic:  Alert and  oriented x4  Intake/Output from previous day: 06/17 0701 - 06/18 0700 In: 3527.1 [P.O.:720; I.V.:1707.1; IV Piggyback:1100] Out: 800 [Urine:800] Intake/Output this shift: Total I/O In: -  Out: 1200 [Urine:1200]  Lab Results: Recent Labs    10/03/17 0929 10/04/17 0550  WBC 10.3 8.0  HGB 11.3* 10.0*  HCT 34.5* 30.6*  PLT 276 249   BMET Recent Labs    10/03/17 0929 10/04/17 0550  NA 138 139  K 3.9 3.7  CL 103 112*  CO2 24 22  GLUCOSE 109* 95  BUN 20 11  CREATININE 0.85 0.62  CALCIUM 9.1 8.0*   LFT Recent Labs    10/03/17 0929  PROT 7.6  ALBUMIN 3.2*  AST 44*  ALT 57*  ALKPHOS 107  BILITOT 0.9   PT/INR Recent Labs    10/03/17 0929  LABPROT 14.7  INR 1.16     Studies/Results: Dg Chest 1 View  Result Date: 10/03/2017 CLINICAL DATA:  Rectal bleeding. History of stroke and hypertension. EXAM: CHEST  1 VIEW COMPARISON:  Radiographs 07/14/2017 and 12/16/2007. FINDINGS: 0938 hours. There are lower lung volumes with minimal resulting bibasilar atelectasis. No edema, confluent airspace opacity, pleural effusion or pneumothorax. The heart  size and mediastinal contours are stable. No acute osseous findings are seen. Telemetry leads overlie the chest. IMPRESSION: No acute cardiopulmonary process.  Minimal bibasilar atelectasis. Electronically Signed   By: Richardean Sale M.D.   On: 10/03/2017 09:53    Assessment: 62 year old female presenting with low-volume rectal bleeding in setting of Lovenox s/p  recent hip fracture and repair on 6/7. No further bleeding. Likely benign anorectal source. Colonoscopy last completed in 2010. Recommend continuing anusol as outpatient and will follow-up with her in the office to discuss elective colonoscopy once off all anticoagulation and cleared from an ortho standpoint.   Plan: Continue Anusol BID Will arrange outpatient follow-up with [place] to discuss colonoscopy Anticipate discharge today Will sign off   Annitta Needs, PhD, ANP-BC [facility]    LOS: 1 day    10/04/2017, 10:05 AM

## 2017-10-04 NOTE — Progress Notes (Signed)
Removed IV-clean, dry, intact. Reviewed d/c paperwork with patient and family. Answered all questions. Husband was present. He is POA. Stable patient and belongings were wheeled to main entrance where she was picked up by husband.

## 2017-10-04 NOTE — Plan of Care (Signed)
progressing 

## 2017-10-04 NOTE — Care Management CC44 (Signed)
Condition Code 44 Documentation Completed  Patient Details  Name: Courtenay MORGANN WOODBURN MRN: [id number] Date of Birth: 04-04-1956   Condition Code 44 given:  Yes Patient signature on Condition Code 44 notice:  Yes Documentation of 2 MD's agreement:  Yes Code 44 added to claim:  Yes    Sherald Barge, RN 10/04/2017, 12:52 PM

## 2017-10-05 DIAGNOSIS — S72141A Displaced intertrochanteric fracture of right femur, initial encounter for closed fracture: Secondary | ICD-10-CM | POA: Diagnosis not present

## 2017-10-05 DIAGNOSIS — I69320 Aphasia following cerebral infarction: Secondary | ICD-10-CM | POA: Diagnosis not present

## 2017-10-05 DIAGNOSIS — I10 Essential (primary) hypertension: Secondary | ICD-10-CM | POA: Diagnosis not present

## 2017-10-05 DIAGNOSIS — I2510 Atherosclerotic heart disease of native coronary artery without angina pectoris: Secondary | ICD-10-CM | POA: Diagnosis not present

## 2017-10-05 DIAGNOSIS — M069 Rheumatoid arthritis, unspecified: Secondary | ICD-10-CM | POA: Diagnosis not present

## 2017-10-05 DIAGNOSIS — S72141D Displaced intertrochanteric fracture of right femur, subsequent encounter for closed fracture with routine healing: Secondary | ICD-10-CM | POA: Diagnosis not present

## 2017-10-05 DIAGNOSIS — I69351 Hemiplegia and hemiparesis following cerebral infarction affecting right dominant side: Secondary | ICD-10-CM | POA: Diagnosis not present

## 2017-10-06 ENCOUNTER — Telehealth: Payer: Self-pay

## 2017-10-06 DIAGNOSIS — S72141D Displaced intertrochanteric fracture of right femur, subsequent encounter for closed fracture with routine healing: Secondary | ICD-10-CM | POA: Diagnosis not present

## 2017-10-06 DIAGNOSIS — I2510 Atherosclerotic heart disease of native coronary artery without angina pectoris: Secondary | ICD-10-CM | POA: Diagnosis not present

## 2017-10-06 DIAGNOSIS — I10 Essential (primary) hypertension: Secondary | ICD-10-CM | POA: Diagnosis not present

## 2017-10-06 DIAGNOSIS — M069 Rheumatoid arthritis, unspecified: Secondary | ICD-10-CM | POA: Diagnosis not present

## 2017-10-06 DIAGNOSIS — I69320 Aphasia following cerebral infarction: Secondary | ICD-10-CM | POA: Diagnosis not present

## 2017-10-06 DIAGNOSIS — I69351 Hemiplegia and hemiparesis following cerebral infarction affecting right dominant side: Secondary | ICD-10-CM | POA: Diagnosis not present

## 2017-10-06 NOTE — Telephone Encounter (Signed)
Transition Care Management Follow-up Telephone Call   Date discharged? 10/04/17              How have you been since you were released from the hospital? pretty good   Do you understand why you were in the hospital? GI bleed    Do you understand the discharge instructions? yes   Where were you discharged to? home   Items Reviewed:  Medications reviewed: yes  Allergies reviewed: yes  Dietary changes reviewed: yes  Referrals reviewed: gastroenterology   Functional Questionnaire:   Activities of Daily Living (ADLs):  has help    Any transportation issues/concerns?: no   Any patient concerns? no   Confirmed importance and date/time of follow-up visits scheduled yes. Lorrinda 26, 2019 at 10:00     Confirmed with patient if condition begins to worsen call PCP or go to the ER.  Patient was given the office number and encouraged to call back with question or concerns. Yes with verbal understanding.

## 2017-10-07 ENCOUNTER — Telehealth: Payer: Self-pay | Admitting: Cardiology

## 2017-10-07 DIAGNOSIS — I69351 Hemiplegia and hemiparesis following cerebral infarction affecting right dominant side: Secondary | ICD-10-CM | POA: Diagnosis not present

## 2017-10-07 DIAGNOSIS — M069 Rheumatoid arthritis, unspecified: Secondary | ICD-10-CM | POA: Diagnosis not present

## 2017-10-07 DIAGNOSIS — I2510 Atherosclerotic heart disease of native coronary artery without angina pectoris: Secondary | ICD-10-CM | POA: Diagnosis not present

## 2017-10-07 DIAGNOSIS — I69320 Aphasia following cerebral infarction: Secondary | ICD-10-CM | POA: Diagnosis not present

## 2017-10-07 DIAGNOSIS — S72141D Displaced intertrochanteric fracture of right femur, subsequent encounter for closed fracture with routine healing: Secondary | ICD-10-CM | POA: Diagnosis not present

## 2017-10-07 DIAGNOSIS — I10 Essential (primary) hypertension: Secondary | ICD-10-CM | POA: Diagnosis not present

## 2017-10-07 MED ORDER — HYDROCHLOROTHIAZIDE 25 MG PO TABS: 25.0000 mg | ORAL_TABLET | Freq: Every day | ORAL | 3 refills | Status: DC

## 2017-10-07 NOTE — Telephone Encounter (Signed)
Patient's husband has questions regarding medications upon d/c from [facility].  tg

## 2017-10-07 NOTE — Telephone Encounter (Signed)
Pt needed HCTZ to local pharmacy

## 2017-10-10 ENCOUNTER — Telehealth: Payer: Self-pay | Admitting: Family Medicine

## 2017-10-10 DIAGNOSIS — S72141D Displaced intertrochanteric fracture of right femur, subsequent encounter for closed fracture with routine healing: Secondary | ICD-10-CM | POA: Diagnosis not present

## 2017-10-10 DIAGNOSIS — I69320 Aphasia following cerebral infarction: Secondary | ICD-10-CM | POA: Diagnosis not present

## 2017-10-10 DIAGNOSIS — M069 Rheumatoid arthritis, unspecified: Secondary | ICD-10-CM | POA: Diagnosis not present

## 2017-10-10 DIAGNOSIS — I69351 Hemiplegia and hemiparesis following cerebral infarction affecting right dominant side: Secondary | ICD-10-CM | POA: Diagnosis not present

## 2017-10-10 DIAGNOSIS — I2510 Atherosclerotic heart disease of native coronary artery without angina pectoris: Secondary | ICD-10-CM | POA: Diagnosis not present

## 2017-10-10 DIAGNOSIS — I10 Essential (primary) hypertension: Secondary | ICD-10-CM | POA: Diagnosis not present

## 2017-10-10 NOTE — Telephone Encounter (Signed)
[facility] called and wants to get a verbal order for OT 2 x @ week for 4 weeks.

## 2017-10-11 ENCOUNTER — Telehealth: Payer: Self-pay | Admitting: Family Medicine

## 2017-10-11 DIAGNOSIS — I69320 Aphasia following cerebral infarction: Secondary | ICD-10-CM | POA: Diagnosis not present

## 2017-10-11 DIAGNOSIS — S72141D Displaced intertrochanteric fracture of right femur, subsequent encounter for closed fracture with routine healing: Secondary | ICD-10-CM | POA: Diagnosis not present

## 2017-10-11 DIAGNOSIS — M069 Rheumatoid arthritis, unspecified: Secondary | ICD-10-CM | POA: Diagnosis not present

## 2017-10-11 DIAGNOSIS — I2510 Atherosclerotic heart disease of native coronary artery without angina pectoris: Secondary | ICD-10-CM | POA: Diagnosis not present

## 2017-10-11 DIAGNOSIS — I69351 Hemiplegia and hemiparesis following cerebral infarction affecting right dominant side: Secondary | ICD-10-CM | POA: Diagnosis not present

## 2017-10-11 DIAGNOSIS — I10 Essential (primary) hypertension: Secondary | ICD-10-CM | POA: Diagnosis not present

## 2017-10-11 NOTE — Telephone Encounter (Signed)
A verbal was left on her voicemail this am. Not sure what time she left the message but if she calls back this has been done

## 2017-10-11 NOTE — Telephone Encounter (Signed)
Verbal order given  

## 2017-10-11 NOTE — Telephone Encounter (Signed)
Danielle Cruz from [place] left a voicemail requesting verbal for homehealth OT 2x a week for 4 weeks Cb#: [phone]  *she says she has left a couple messages since Friday, however I checked the voicemail this morning and only got this one ?

## 2017-10-12 ENCOUNTER — Ambulatory Visit: Payer: Medicare Other | Admitting: Family Medicine

## 2017-10-12 DIAGNOSIS — I2510 Atherosclerotic heart disease of native coronary artery without angina pectoris: Secondary | ICD-10-CM | POA: Diagnosis not present

## 2017-10-12 DIAGNOSIS — M069 Rheumatoid arthritis, unspecified: Secondary | ICD-10-CM | POA: Diagnosis not present

## 2017-10-12 DIAGNOSIS — S72141D Displaced intertrochanteric fracture of right femur, subsequent encounter for closed fracture with routine healing: Secondary | ICD-10-CM | POA: Diagnosis not present

## 2017-10-12 DIAGNOSIS — I69320 Aphasia following cerebral infarction: Secondary | ICD-10-CM | POA: Diagnosis not present

## 2017-10-12 DIAGNOSIS — I69351 Hemiplegia and hemiparesis following cerebral infarction affecting right dominant side: Secondary | ICD-10-CM | POA: Diagnosis not present

## 2017-10-12 DIAGNOSIS — I10 Essential (primary) hypertension: Secondary | ICD-10-CM | POA: Diagnosis not present

## 2017-10-14 DIAGNOSIS — M069 Rheumatoid arthritis, unspecified: Secondary | ICD-10-CM | POA: Diagnosis not present

## 2017-10-14 DIAGNOSIS — I2510 Atherosclerotic heart disease of native coronary artery without angina pectoris: Secondary | ICD-10-CM | POA: Diagnosis not present

## 2017-10-14 DIAGNOSIS — I69320 Aphasia following cerebral infarction: Secondary | ICD-10-CM | POA: Diagnosis not present

## 2017-10-14 DIAGNOSIS — S72141D Displaced intertrochanteric fracture of right femur, subsequent encounter for closed fracture with routine healing: Secondary | ICD-10-CM | POA: Diagnosis not present

## 2017-10-14 DIAGNOSIS — I69351 Hemiplegia and hemiparesis following cerebral infarction affecting right dominant side: Secondary | ICD-10-CM | POA: Diagnosis not present

## 2017-10-14 DIAGNOSIS — I10 Essential (primary) hypertension: Secondary | ICD-10-CM | POA: Diagnosis not present

## 2017-10-17 DIAGNOSIS — I69320 Aphasia following cerebral infarction: Secondary | ICD-10-CM | POA: Diagnosis not present

## 2017-10-17 DIAGNOSIS — I2510 Atherosclerotic heart disease of native coronary artery without angina pectoris: Secondary | ICD-10-CM | POA: Diagnosis not present

## 2017-10-17 DIAGNOSIS — M069 Rheumatoid arthritis, unspecified: Secondary | ICD-10-CM | POA: Diagnosis not present

## 2017-10-17 DIAGNOSIS — S72141D Displaced intertrochanteric fracture of right femur, subsequent encounter for closed fracture with routine healing: Secondary | ICD-10-CM | POA: Diagnosis not present

## 2017-10-17 DIAGNOSIS — I10 Essential (primary) hypertension: Secondary | ICD-10-CM | POA: Diagnosis not present

## 2017-10-17 DIAGNOSIS — I69351 Hemiplegia and hemiparesis following cerebral infarction affecting right dominant side: Secondary | ICD-10-CM | POA: Diagnosis not present

## 2017-10-18 DIAGNOSIS — M069 Rheumatoid arthritis, unspecified: Secondary | ICD-10-CM | POA: Diagnosis not present

## 2017-10-18 DIAGNOSIS — I69320 Aphasia following cerebral infarction: Secondary | ICD-10-CM | POA: Diagnosis not present

## 2017-10-18 DIAGNOSIS — I10 Essential (primary) hypertension: Secondary | ICD-10-CM | POA: Diagnosis not present

## 2017-10-18 DIAGNOSIS — S72141D Displaced intertrochanteric fracture of right femur, subsequent encounter for closed fracture with routine healing: Secondary | ICD-10-CM | POA: Diagnosis not present

## 2017-10-18 DIAGNOSIS — I2510 Atherosclerotic heart disease of native coronary artery without angina pectoris: Secondary | ICD-10-CM | POA: Diagnosis not present

## 2017-10-18 DIAGNOSIS — I69351 Hemiplegia and hemiparesis following cerebral infarction affecting right dominant side: Secondary | ICD-10-CM | POA: Diagnosis not present

## 2017-10-19 DIAGNOSIS — I69320 Aphasia following cerebral infarction: Secondary | ICD-10-CM | POA: Diagnosis not present

## 2017-10-19 DIAGNOSIS — M069 Rheumatoid arthritis, unspecified: Secondary | ICD-10-CM | POA: Diagnosis not present

## 2017-10-19 DIAGNOSIS — S72141D Displaced intertrochanteric fracture of right femur, subsequent encounter for closed fracture with routine healing: Secondary | ICD-10-CM | POA: Diagnosis not present

## 2017-10-19 DIAGNOSIS — I2510 Atherosclerotic heart disease of native coronary artery without angina pectoris: Secondary | ICD-10-CM | POA: Diagnosis not present

## 2017-10-19 DIAGNOSIS — I10 Essential (primary) hypertension: Secondary | ICD-10-CM | POA: Diagnosis not present

## 2017-10-19 DIAGNOSIS — I69351 Hemiplegia and hemiparesis following cerebral infarction affecting right dominant side: Secondary | ICD-10-CM | POA: Diagnosis not present

## 2017-10-21 DIAGNOSIS — M069 Rheumatoid arthritis, unspecified: Secondary | ICD-10-CM | POA: Diagnosis not present

## 2017-10-21 DIAGNOSIS — I69320 Aphasia following cerebral infarction: Secondary | ICD-10-CM | POA: Diagnosis not present

## 2017-10-21 DIAGNOSIS — S72141D Displaced intertrochanteric fracture of right femur, subsequent encounter for closed fracture with routine healing: Secondary | ICD-10-CM | POA: Diagnosis not present

## 2017-10-21 DIAGNOSIS — I69351 Hemiplegia and hemiparesis following cerebral infarction affecting right dominant side: Secondary | ICD-10-CM | POA: Diagnosis not present

## 2017-10-21 DIAGNOSIS — I2510 Atherosclerotic heart disease of native coronary artery without angina pectoris: Secondary | ICD-10-CM | POA: Diagnosis not present

## 2017-10-21 DIAGNOSIS — I10 Essential (primary) hypertension: Secondary | ICD-10-CM | POA: Diagnosis not present

## 2017-10-24 ENCOUNTER — Telehealth: Payer: Self-pay | Admitting: Family Medicine

## 2017-10-24 NOTE — Telephone Encounter (Signed)
Verbal order given  

## 2017-10-24 NOTE — Telephone Encounter (Signed)
Erline Levine needs an update for Ms Strayer's PT.  She would like to extend the PT order for 2 times each week for 3 more weeks additional treatment.  Phone [phone]

## 2017-10-25 ENCOUNTER — Encounter: Payer: Self-pay | Admitting: Family Medicine

## 2017-10-25 ENCOUNTER — Ambulatory Visit (INDEPENDENT_AMBULATORY_CARE_PROVIDER_SITE_OTHER): Payer: Medicare Other | Admitting: Family Medicine

## 2017-10-25 VITALS — BP 140/80 | HR 86 | Temp 98.5°F | Resp 16

## 2017-10-25 DIAGNOSIS — R05 Cough: Secondary | ICD-10-CM

## 2017-10-25 DIAGNOSIS — D649 Anemia, unspecified: Secondary | ICD-10-CM

## 2017-10-25 DIAGNOSIS — I2510 Atherosclerotic heart disease of native coronary artery without angina pectoris: Secondary | ICD-10-CM

## 2017-10-25 DIAGNOSIS — I69320 Aphasia following cerebral infarction: Secondary | ICD-10-CM | POA: Diagnosis not present

## 2017-10-25 DIAGNOSIS — I10 Essential (primary) hypertension: Secondary | ICD-10-CM

## 2017-10-25 DIAGNOSIS — Z09 Encounter for follow-up examination after completed treatment for conditions other than malignant neoplasm: Secondary | ICD-10-CM

## 2017-10-25 DIAGNOSIS — E785 Hyperlipidemia, unspecified: Secondary | ICD-10-CM

## 2017-10-25 DIAGNOSIS — I69351 Hemiplegia and hemiparesis following cerebral infarction affecting right dominant side: Secondary | ICD-10-CM | POA: Diagnosis not present

## 2017-10-25 DIAGNOSIS — R059 Cough, unspecified: Secondary | ICD-10-CM

## 2017-10-25 DIAGNOSIS — E559 Vitamin D deficiency, unspecified: Secondary | ICD-10-CM | POA: Diagnosis not present

## 2017-10-25 DIAGNOSIS — M069 Rheumatoid arthritis, unspecified: Secondary | ICD-10-CM | POA: Diagnosis not present

## 2017-10-25 DIAGNOSIS — S72141D Displaced intertrochanteric fracture of right femur, subsequent encounter for closed fracture with routine healing: Secondary | ICD-10-CM | POA: Diagnosis not present

## 2017-10-25 MED ORDER — BENZONATATE 100 MG PO CAPS: 100.0000 mg | ORAL_CAPSULE | Freq: Two times a day (BID) | ORAL | 0 refills | Status: DC | PRN

## 2017-10-25 NOTE — Patient Instructions (Addendum)
F/U in 3 months, call if you need me before  Please get CBC, iron and ferritin and cmp  And EGFr, lipid and vit D today  You will need bone building medication in the next 3 to 6 monhs, because of your fracture  Decongestant pills are sent for "as needed" use  Work with therapy and you WILL walk again, we are cheering you on and so is your family  Thank you  for choosing [facility]. We consider it a privelige to serve you.  Delivering excellent health care in a caring and  compassionate way is our goal.  Partnering with you,  so that together we can achieve this goal is our strategy.

## 2017-10-26 DIAGNOSIS — I2510 Atherosclerotic heart disease of native coronary artery without angina pectoris: Secondary | ICD-10-CM | POA: Diagnosis not present

## 2017-10-26 DIAGNOSIS — I69320 Aphasia following cerebral infarction: Secondary | ICD-10-CM | POA: Diagnosis not present

## 2017-10-26 DIAGNOSIS — I10 Essential (primary) hypertension: Secondary | ICD-10-CM | POA: Diagnosis not present

## 2017-10-26 DIAGNOSIS — S72141D Displaced intertrochanteric fracture of right femur, subsequent encounter for closed fracture with routine healing: Secondary | ICD-10-CM | POA: Diagnosis not present

## 2017-10-26 DIAGNOSIS — M069 Rheumatoid arthritis, unspecified: Secondary | ICD-10-CM | POA: Diagnosis not present

## 2017-10-26 DIAGNOSIS — I69351 Hemiplegia and hemiparesis following cerebral infarction affecting right dominant side: Secondary | ICD-10-CM | POA: Diagnosis not present

## 2017-10-26 LAB — CBC
HCT: 31.2 % — ABNORMAL LOW (ref 35.0–45.0)
Hemoglobin: 10.5 g/dL — ABNORMAL LOW (ref 11.7–15.5)
MCH: 31 pg (ref 27.0–33.0)
MCHC: 33.7 g/dL (ref 32.0–36.0)
MCV: 92 fL (ref 80.0–100.0)
MPV: 11 fL (ref 7.5–12.5)
Platelets: 227 10*3/uL (ref 140–400)
RBC: 3.39 10*6/uL — ABNORMAL LOW (ref 3.80–5.10)
RDW: 14.4 % (ref 11.0–15.0)
WBC: 7.4 10*3/uL (ref 3.8–10.8)

## 2017-10-26 LAB — LIPID PANEL
Cholesterol: 190 mg/dL (ref <200)
HDL: 23 mg/dL — ABNORMAL LOW (ref >50)
LDL Cholesterol (Calc): 112 mg/dL (calc) — ABNORMAL HIGH
Non-HDL Cholesterol (Calc): 167 mg/dL (calc) — ABNORMAL HIGH (ref <130)
Total CHOL/HDL Ratio: 8.3 (calc) — ABNORMAL HIGH (ref <5.0)
Triglycerides: 384 mg/dL — ABNORMAL HIGH (ref <150)

## 2017-10-26 LAB — FERRITIN: Ferritin: 1530 ng/mL — ABNORMAL HIGH (ref 16–288)

## 2017-10-26 LAB — VITAMIN D 25 HYDROXY (VIT D DEFICIENCY, FRACTURES): Vit D, 25-Hydroxy: 34 ng/mL (ref 30–100)

## 2017-10-26 LAB — IRON: Iron: 73 ug/dL (ref 45–160)

## 2017-10-27 DIAGNOSIS — I10 Essential (primary) hypertension: Secondary | ICD-10-CM | POA: Diagnosis not present

## 2017-10-27 DIAGNOSIS — S72141D Displaced intertrochanteric fracture of right femur, subsequent encounter for closed fracture with routine healing: Secondary | ICD-10-CM | POA: Diagnosis not present

## 2017-10-27 DIAGNOSIS — M069 Rheumatoid arthritis, unspecified: Secondary | ICD-10-CM | POA: Diagnosis not present

## 2017-10-27 DIAGNOSIS — I69320 Aphasia following cerebral infarction: Secondary | ICD-10-CM | POA: Diagnosis not present

## 2017-10-27 DIAGNOSIS — I69351 Hemiplegia and hemiparesis following cerebral infarction affecting right dominant side: Secondary | ICD-10-CM | POA: Diagnosis not present

## 2017-10-27 DIAGNOSIS — I2510 Atherosclerotic heart disease of native coronary artery without angina pectoris: Secondary | ICD-10-CM | POA: Diagnosis not present

## 2017-10-28 ENCOUNTER — Telehealth: Payer: Self-pay | Admitting: Pediatrics

## 2017-10-28 ENCOUNTER — Telehealth: Payer: Self-pay | Admitting: Family Medicine

## 2017-10-28 DIAGNOSIS — I69320 Aphasia following cerebral infarction: Secondary | ICD-10-CM | POA: Diagnosis not present

## 2017-10-28 DIAGNOSIS — S72141D Displaced intertrochanteric fracture of right femur, subsequent encounter for closed fracture with routine healing: Secondary | ICD-10-CM | POA: Diagnosis not present

## 2017-10-28 DIAGNOSIS — M069 Rheumatoid arthritis, unspecified: Secondary | ICD-10-CM | POA: Diagnosis not present

## 2017-10-28 DIAGNOSIS — I69351 Hemiplegia and hemiparesis following cerebral infarction affecting right dominant side: Secondary | ICD-10-CM | POA: Diagnosis not present

## 2017-10-28 DIAGNOSIS — I10 Essential (primary) hypertension: Secondary | ICD-10-CM | POA: Diagnosis not present

## 2017-10-28 DIAGNOSIS — I2510 Atherosclerotic heart disease of native coronary artery without angina pectoris: Secondary | ICD-10-CM | POA: Diagnosis not present

## 2017-10-28 NOTE — Telephone Encounter (Signed)
Noted and agree, thank you!

## 2017-10-28 NOTE — Telephone Encounter (Signed)
Spoke with Tammy and gave verbal for wound care for small break down on patient's buttock.

## 2017-10-28 NOTE — Telephone Encounter (Signed)
Tammy with home health called to request verbal for wound care because patient has a small breakdown on her buttock. Cb# [phone]/ [phone]

## 2017-10-31 ENCOUNTER — Other Ambulatory Visit: Payer: Self-pay | Admitting: Family Medicine

## 2017-10-31 DIAGNOSIS — I10 Essential (primary) hypertension: Secondary | ICD-10-CM | POA: Diagnosis not present

## 2017-10-31 DIAGNOSIS — M069 Rheumatoid arthritis, unspecified: Secondary | ICD-10-CM | POA: Diagnosis not present

## 2017-10-31 DIAGNOSIS — I69351 Hemiplegia and hemiparesis following cerebral infarction affecting right dominant side: Secondary | ICD-10-CM | POA: Diagnosis not present

## 2017-10-31 DIAGNOSIS — I69320 Aphasia following cerebral infarction: Secondary | ICD-10-CM | POA: Diagnosis not present

## 2017-10-31 DIAGNOSIS — S72141D Displaced intertrochanteric fracture of right femur, subsequent encounter for closed fracture with routine healing: Secondary | ICD-10-CM | POA: Diagnosis not present

## 2017-10-31 DIAGNOSIS — I2510 Atherosclerotic heart disease of native coronary artery without angina pectoris: Secondary | ICD-10-CM | POA: Diagnosis not present

## 2017-11-01 DIAGNOSIS — S72141D Displaced intertrochanteric fracture of right femur, subsequent encounter for closed fracture with routine healing: Secondary | ICD-10-CM | POA: Diagnosis not present

## 2017-11-01 DIAGNOSIS — I10 Essential (primary) hypertension: Secondary | ICD-10-CM | POA: Diagnosis not present

## 2017-11-01 DIAGNOSIS — I2510 Atherosclerotic heart disease of native coronary artery without angina pectoris: Secondary | ICD-10-CM | POA: Diagnosis not present

## 2017-11-01 DIAGNOSIS — I69320 Aphasia following cerebral infarction: Secondary | ICD-10-CM | POA: Diagnosis not present

## 2017-11-01 DIAGNOSIS — M069 Rheumatoid arthritis, unspecified: Secondary | ICD-10-CM | POA: Diagnosis not present

## 2017-11-01 DIAGNOSIS — I69351 Hemiplegia and hemiparesis following cerebral infarction affecting right dominant side: Secondary | ICD-10-CM | POA: Diagnosis not present

## 2017-11-02 DIAGNOSIS — S72141D Displaced intertrochanteric fracture of right femur, subsequent encounter for closed fracture with routine healing: Secondary | ICD-10-CM | POA: Diagnosis not present

## 2017-11-02 DIAGNOSIS — I69320 Aphasia following cerebral infarction: Secondary | ICD-10-CM | POA: Diagnosis not present

## 2017-11-02 DIAGNOSIS — M069 Rheumatoid arthritis, unspecified: Secondary | ICD-10-CM | POA: Diagnosis not present

## 2017-11-02 DIAGNOSIS — I10 Essential (primary) hypertension: Secondary | ICD-10-CM | POA: Diagnosis not present

## 2017-11-02 DIAGNOSIS — I2510 Atherosclerotic heart disease of native coronary artery without angina pectoris: Secondary | ICD-10-CM | POA: Diagnosis not present

## 2017-11-02 DIAGNOSIS — I69351 Hemiplegia and hemiparesis following cerebral infarction affecting right dominant side: Secondary | ICD-10-CM | POA: Diagnosis not present

## 2017-11-04 DIAGNOSIS — I69351 Hemiplegia and hemiparesis following cerebral infarction affecting right dominant side: Secondary | ICD-10-CM | POA: Diagnosis not present

## 2017-11-04 DIAGNOSIS — I69320 Aphasia following cerebral infarction: Secondary | ICD-10-CM | POA: Diagnosis not present

## 2017-11-04 DIAGNOSIS — I2510 Atherosclerotic heart disease of native coronary artery without angina pectoris: Secondary | ICD-10-CM | POA: Diagnosis not present

## 2017-11-04 DIAGNOSIS — M069 Rheumatoid arthritis, unspecified: Secondary | ICD-10-CM | POA: Diagnosis not present

## 2017-11-04 DIAGNOSIS — S72141D Displaced intertrochanteric fracture of right femur, subsequent encounter for closed fracture with routine healing: Secondary | ICD-10-CM | POA: Diagnosis not present

## 2017-11-04 DIAGNOSIS — I10 Essential (primary) hypertension: Secondary | ICD-10-CM | POA: Diagnosis not present

## 2017-11-07 DIAGNOSIS — S72141D Displaced intertrochanteric fracture of right femur, subsequent encounter for closed fracture with routine healing: Secondary | ICD-10-CM | POA: Diagnosis not present

## 2017-11-08 DIAGNOSIS — I69351 Hemiplegia and hemiparesis following cerebral infarction affecting right dominant side: Secondary | ICD-10-CM | POA: Diagnosis not present

## 2017-11-08 DIAGNOSIS — M069 Rheumatoid arthritis, unspecified: Secondary | ICD-10-CM | POA: Diagnosis not present

## 2017-11-08 DIAGNOSIS — I2510 Atherosclerotic heart disease of native coronary artery without angina pectoris: Secondary | ICD-10-CM | POA: Diagnosis not present

## 2017-11-08 DIAGNOSIS — I10 Essential (primary) hypertension: Secondary | ICD-10-CM | POA: Diagnosis not present

## 2017-11-08 DIAGNOSIS — I69320 Aphasia following cerebral infarction: Secondary | ICD-10-CM | POA: Diagnosis not present

## 2017-11-08 DIAGNOSIS — S72141D Displaced intertrochanteric fracture of right femur, subsequent encounter for closed fracture with routine healing: Secondary | ICD-10-CM | POA: Diagnosis not present

## 2017-11-10 DIAGNOSIS — I69351 Hemiplegia and hemiparesis following cerebral infarction affecting right dominant side: Secondary | ICD-10-CM | POA: Diagnosis not present

## 2017-11-10 DIAGNOSIS — I10 Essential (primary) hypertension: Secondary | ICD-10-CM | POA: Diagnosis not present

## 2017-11-10 DIAGNOSIS — M069 Rheumatoid arthritis, unspecified: Secondary | ICD-10-CM | POA: Diagnosis not present

## 2017-11-10 DIAGNOSIS — S72141D Displaced intertrochanteric fracture of right femur, subsequent encounter for closed fracture with routine healing: Secondary | ICD-10-CM | POA: Diagnosis not present

## 2017-11-10 DIAGNOSIS — I2510 Atherosclerotic heart disease of native coronary artery without angina pectoris: Secondary | ICD-10-CM | POA: Diagnosis not present

## 2017-11-10 DIAGNOSIS — I69320 Aphasia following cerebral infarction: Secondary | ICD-10-CM | POA: Diagnosis not present

## 2017-11-15 DIAGNOSIS — I69320 Aphasia following cerebral infarction: Secondary | ICD-10-CM | POA: Diagnosis not present

## 2017-11-15 DIAGNOSIS — Z13820 Encounter for screening for osteoporosis: Secondary | ICD-10-CM | POA: Diagnosis not present

## 2017-11-15 DIAGNOSIS — M069 Rheumatoid arthritis, unspecified: Secondary | ICD-10-CM | POA: Diagnosis not present

## 2017-11-15 DIAGNOSIS — M25579 Pain in unspecified ankle and joints of unspecified foot: Secondary | ICD-10-CM | POA: Diagnosis not present

## 2017-11-15 DIAGNOSIS — M0579 Rheumatoid arthritis with rheumatoid factor of multiple sites without organ or systems involvement: Secondary | ICD-10-CM | POA: Diagnosis not present

## 2017-11-15 DIAGNOSIS — M2550 Pain in unspecified joint: Secondary | ICD-10-CM | POA: Diagnosis not present

## 2017-11-15 DIAGNOSIS — S72141D Displaced intertrochanteric fracture of right femur, subsequent encounter for closed fracture with routine healing: Secondary | ICD-10-CM | POA: Diagnosis not present

## 2017-11-15 DIAGNOSIS — G8929 Other chronic pain: Secondary | ICD-10-CM | POA: Diagnosis not present

## 2017-11-15 DIAGNOSIS — I69351 Hemiplegia and hemiparesis following cerebral infarction affecting right dominant side: Secondary | ICD-10-CM | POA: Diagnosis not present

## 2017-11-15 DIAGNOSIS — I2510 Atherosclerotic heart disease of native coronary artery without angina pectoris: Secondary | ICD-10-CM | POA: Diagnosis not present

## 2017-11-15 DIAGNOSIS — R748 Abnormal levels of other serum enzymes: Secondary | ICD-10-CM | POA: Diagnosis not present

## 2017-11-15 DIAGNOSIS — M25511 Pain in right shoulder: Secondary | ICD-10-CM | POA: Diagnosis not present

## 2017-11-15 DIAGNOSIS — N179 Acute kidney failure, unspecified: Secondary | ICD-10-CM | POA: Diagnosis not present

## 2017-11-15 DIAGNOSIS — Z79899 Other long term (current) drug therapy: Secondary | ICD-10-CM | POA: Diagnosis not present

## 2017-11-15 DIAGNOSIS — M7581 Other shoulder lesions, right shoulder: Secondary | ICD-10-CM | POA: Diagnosis not present

## 2017-11-15 DIAGNOSIS — I10 Essential (primary) hypertension: Secondary | ICD-10-CM | POA: Diagnosis not present

## 2017-11-18 ENCOUNTER — Telehealth: Payer: Self-pay | Admitting: General Practice

## 2017-11-18 DIAGNOSIS — K625 Hemorrhage of anus and rectum: Secondary | ICD-10-CM

## 2017-11-18 NOTE — Telephone Encounter (Signed)
Patient's husband called and stated she is having some bright red blood on the toilet paper.  She is scheduled to see RMR 8/20 at 11:30am  Routing to Randall Hiss to see if he has any further recommendations.

## 2017-11-21 DIAGNOSIS — M069 Rheumatoid arthritis, unspecified: Secondary | ICD-10-CM | POA: Diagnosis not present

## 2017-11-21 DIAGNOSIS — I10 Essential (primary) hypertension: Secondary | ICD-10-CM | POA: Diagnosis not present

## 2017-11-21 DIAGNOSIS — I2510 Atherosclerotic heart disease of native coronary artery without angina pectoris: Secondary | ICD-10-CM | POA: Diagnosis not present

## 2017-11-21 DIAGNOSIS — I69320 Aphasia following cerebral infarction: Secondary | ICD-10-CM | POA: Diagnosis not present

## 2017-11-21 DIAGNOSIS — S72141D Displaced intertrochanteric fracture of right femur, subsequent encounter for closed fracture with routine healing: Secondary | ICD-10-CM | POA: Diagnosis not present

## 2017-11-21 DIAGNOSIS — I69351 Hemiplegia and hemiparesis following cerebral infarction affecting right dominant side: Secondary | ICD-10-CM | POA: Diagnosis not present

## 2017-11-28 NOTE — Addendum Note (Signed)
Addended by: Gordy Levan, Gardiner [place] A on: 11/28/2017 04:04 PM   Modules accepted: Orders

## 2017-11-28 NOTE — Telephone Encounter (Signed)
I put in for a CBC. Please have the patient get this drawn. If worsening bleeding, notify us or (if needed) proceed to the ER. If any noted weakness, dizziness, passing out, chest pain, or worsening shortness of breath then proceed to the ER.  Call if any questions.

## 2017-11-29 DIAGNOSIS — K625 Hemorrhage of anus and rectum: Secondary | ICD-10-CM | POA: Diagnosis not present

## 2017-11-29 LAB — CBC WITH DIFFERENTIAL/PLATELET
Basophils Absolute: 49 cells/uL (ref 0–200)
Basophils Relative: 0.3 %
Eosinophils Absolute: 180 cells/uL (ref 15–500)
Eosinophils Relative: 1.1 %
HCT: 27.3 % — ABNORMAL LOW (ref 35.0–45.0)
Hemoglobin: 8.9 g/dL — ABNORMAL LOW (ref 11.7–15.5)
Lymphs Abs: 1460 cells/uL (ref 850–3900)
MCH: 30.8 pg (ref 27.0–33.0)
MCHC: 32.6 g/dL (ref 32.0–36.0)
MCV: 94.5 fL (ref 80.0–100.0)
MPV: 10.7 fL (ref 7.5–12.5)
Monocytes Relative: 7.8 %
Neutro Abs: 13432 cells/uL — ABNORMAL HIGH (ref 1500–7800)
Neutrophils Relative %: 81.9 %
Platelets: 240 10*3/uL (ref 140–400)
RBC: 2.89 10*6/uL — ABNORMAL LOW (ref 3.80–5.10)
RDW: 14.4 % (ref 11.0–15.0)
Total Lymphocyte: 8.9 %
WBC mixed population: 1279 cells/uL — ABNORMAL HIGH (ref 200–950)
WBC: 16.4 10*3/uL — ABNORMAL HIGH (ref 3.8–10.8)

## 2017-11-29 NOTE — Telephone Encounter (Signed)
Spoke with spouse. He will take pt to have lab work done today.

## 2017-12-03 ENCOUNTER — Encounter: Payer: Self-pay | Admitting: Family Medicine

## 2017-12-03 DIAGNOSIS — Z09 Encounter for follow-up examination after completed treatment for conditions other than malignant neoplasm: Secondary | ICD-10-CM | POA: Insufficient documentation

## 2017-12-03 NOTE — Assessment & Plan Note (Signed)
In home physical therapy to continue to work with patient as she regains mobility

## 2017-12-03 NOTE — Assessment & Plan Note (Signed)
Hospital courses reviewed  With patient and her spouse , and questions/ concerns were addressed Labs ordered, per discharge note, unfortunately pt is late/ overdue for this visit She denies any further gi bleeding and will follow up with [name] She is to continue to work with in home physical therapy to regain the mobility that she  Once had prior to her fall

## 2017-12-03 NOTE — Progress Notes (Signed)
   Danielle Cruz     MRN: [id number]      DOB: Sep 04, 1955   HPI [name] is here for follow up of recent hospitalization from [name] 17 to 18, 2019 when she presented with an acute intestinal bleed , she had had on 09/22/2017 suffered a right intertrochanteric hip fracture and had been discharged on lovenox, and her husband took her to the hospital after noting a "pool of blood": on the pad Hospital courses are reviewed at this visit , she had GI consult during this most recent hospitalization, and will f/u as out patient with GI As far as her recovery from the hip fracture, her husband notes that she is demonstrating more caution and fear than she normally does when trying to ambulate, and so physical therapy is   Very challenged with her rehab efforts. I encourage Ms Covel to move forward with a positive attitude and hope for recovery to walk again C/o increased cough and chest congestion, . Denies fever or chills  ROS Denies recent fever or chills. Denies sinus pressure, nasal congestion, ear pain or sore throat. . Denies chest pains, palpitations and leg swelling Denies abdominal pain, nausea, vomiting,diarrhea or constipation.   Denies dysuria, frequency, hesitancy or incontinence.  Denies depression, does have mild  anxiety denies  insomnia. Denies skin break down or rash.   PE  BP 140/80   Pulse 86   Temp 98.5 F (36.9 C) (Oral)   Resp 16   SpO2 100%   Patient alert  and in no cardiopulmonary distress.  HEENT: No facial asymmetry, EOMI,   oropharynx pink and moist.  Neck decreased air entry no JVD, no mass.  Chest: decreased air entry, scattered crackles in bases , no wheezes  CVS: S1, S2 no murmurs, no S3.Regular rate.  ABD: Soft non tender.   Ext: No edema  MS: decreased  ROM spine, shoulders, hips and knees.  Skin: visible skin is intact  Psych: Good eye contact,mildly anxious and  depressed appearing.  CNS: CN 2-12 intact, chronic right hemiparesis is  unchanged   [facility] discharge follow-up Hospital courses reviewed  With patient and her spouse , and questions/ concerns were addressed Labs ordered, per discharge note, unfortunately pt is late/ overdue for this visit She denies any further gi bleeding and will follow up with [name] She is to continue to work with in home physical therapy to regain the mobility that she  Once had prior to her fall  Essential hypertension Not at goal currently , however , no changes are being made in her medication at this time  Hyperlipemia Hyperlipidemia:Low fat diet discussed and encouraged. Uncontrolled, not at goal, markedly elevated TG ad high LDL, medication  adjustment will be needed, as well as dietary modification   Lipid Panel  Lab Results  Component Value Date   CHOL 190 10/25/2017   HDL 23 (L) 10/25/2017   LDLCALC 112 (H) 10/25/2017   TRIG 384 (H) 10/25/2017   CHOLHDL 8.3 (H) 10/25/2017       Cough Increased cough with no symptoms of infection, decongestant prescribed  Intertrochanteric fracture of right hip ([place]) In home physical therapy to continue to work with patient as she regains mobility

## 2017-12-03 NOTE — Assessment & Plan Note (Signed)
Increased cough with no symptoms of infection, decongestant prescribed

## 2017-12-03 NOTE — Assessment & Plan Note (Signed)
Not at goal currently , however , no changes are being made in her medication at this time

## 2017-12-03 NOTE — Assessment & Plan Note (Addendum)
Hyperlipidemia:Low fat diet discussed and encouraged. Uncontrolled, not at goal, markedly elevated TG ad high LDL, medication  adjustment will be needed, as well as dietary modification   Lipid Panel  Lab Results  Component Value Date   CHOL 190 10/25/2017   HDL 23 (L) 10/25/2017   LDLCALC 112 (H) 10/25/2017   TRIG 384 (H) 10/25/2017   CHOLHDL 8.3 (H) 10/25/2017

## 2017-12-06 ENCOUNTER — Encounter: Payer: Self-pay | Admitting: Internal Medicine

## 2017-12-06 ENCOUNTER — Encounter: Payer: Self-pay | Admitting: *Deleted

## 2017-12-06 ENCOUNTER — Ambulatory Visit (INDEPENDENT_AMBULATORY_CARE_PROVIDER_SITE_OTHER): Payer: Medicare Other | Admitting: Internal Medicine

## 2017-12-06 ENCOUNTER — Other Ambulatory Visit: Payer: Self-pay | Admitting: Nurse Practitioner

## 2017-12-06 ENCOUNTER — Other Ambulatory Visit: Payer: Self-pay

## 2017-12-06 VITALS — BP 133/78 | HR 101 | Temp 97.1°F | Ht 67.0 in

## 2017-12-06 DIAGNOSIS — K921 Melena: Secondary | ICD-10-CM

## 2017-12-06 DIAGNOSIS — I2510 Atherosclerotic heart disease of native coronary artery without angina pectoris: Secondary | ICD-10-CM | POA: Diagnosis not present

## 2017-12-06 DIAGNOSIS — K625 Hemorrhage of anus and rectum: Secondary | ICD-10-CM

## 2017-12-06 NOTE — Patient Instructions (Signed)
Schedule colonoscopy to further evaluate rectal bleeding - propofol  Will arrange overnight observation in the hospital to help with the preparation  Further recommendations to follow

## 2017-12-06 NOTE — Progress Notes (Signed)
Primary Care Physician:  Fayrene Helper, MD Primary Gastroenterologist:  Dr. Gala Romney  Pre-Procedure History & Physical: HPI:  Danielle Cruz is a 62 y.o. female here for consideration of further evaluation of rectal bleeding. Patient hospitalized back in [place] of this year with right hip fracture. While on Lovenox, patient experienced several episodes of painless rectal bleeding. Since coming off Lovenox those symptoms have resolved. She does remain anemic. She is not iron deficient. She denies abdominal pain or any upper GI tract symptoms such as odynophagia, dysphagia, early satiety, nausea or vomiting. Recent hip fracture and history of stroke severely limits her mobility.  Past Medical History:  Diagnosis Date  . AKI (acute kidney injury) ([place]) 09/22/2017  . Anemia   . 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 Arthritis   . Colonic polyp 2010   Hemorrhoids; h/o mild hematochezia  . CVA (cerebral infarction) 08/2008   sizable left -residual expressive aphasia, right sided weakness; ambulates with difficulty with the right leg brace   . Hyperlipidemia   . Hypertension 2005  . Rheumatoid arthritis(714.0)   . Stroke [facility])     Past Surgical History:  Procedure Laterality Date  . ANKLE SURGERY     Right  . CAROTID STENT INSERTION    . COLONOSCOPY W/ POLYPECTOMY  11/2008   Dr. Gala Romney. Left-sided diverticula, Pedunculated polyp snared (no adenomatous changes)  . FEMUR IM NAIL Right 09/23/2017   Procedure: INTRAMEDULLARY (IM) NAIL FEMORAL;  Surgeon: Renette Butters, MD;  Location: [facility];  Service: Orthopedics;  Laterality: Right;  . OOPHORECTOMY      Prior to Admission medications   Medication Sig Start Date End Date Taking? Authorizing Provider  aspirin EC 81 MG tablet Take 81 mg by mouth daily.   Yes [provider]  atorvastatin (LIPITOR) 40 MG tablet Take 1 tablet (40 mg total) by mouth daily. 06/30/17  12/06/17 Yes BranchAlphonse Guild, MD  atorvastatin (LIPITOR) 80 MG tablet TAKE 1 TABLET(80 MG) BY MOUTH DAILY 10/31/17  Yes Fayrene Helper, MD  baclofen (LIORESAL) 10 MG tablet Take 10 mg by mouth 2 (two) times daily.   Yes [provider]  benzonatate (TESSALON) 100 MG capsule Take 1 capsule (100 mg total) by mouth 2 (two) times daily as needed for cough. 10/25/17  Yes Fayrene Helper, MD  docusate sodium (COLACE) 100 MG capsule Take 1 capsule (100 mg total) by mouth 2 (two) times daily. 09/26/17  Yes Regalado, Belkys A, MD  ferrous sulfate 325 (65 FE) MG tablet Take 1 tablet (325 mg total) by mouth 2 (two) times daily with a meal. 09/26/17  Yes Regalado, Belkys A, MD  fluticasone (FLONASE) 50 MCG/ACT nasal spray Place 1 spray into both nostrils daily. 07/14/17  Yes Fayrene Helper, MD  folic acid (FOLVITE) 1 MG tablet take one tablet daily 04/14/17  Yes [provider]  furosemide (LASIX) 40 MG tablet TAKE 1 TABLET DAILY AS NEEDED FOR SWELLING 09/27/17  Yes Branch, Alphonse Guild, MD  hydrochlorothiazide (HYDRODIURIL) 25 MG tablet Take 1 tablet (25 mg total) by mouth daily. 10/07/17  Yes BranchAlphonse Guild, MD  leflunomide (ARAVA) 20 MG tablet Take 20 mg by mouth daily.     Yes [provider]  metoprolol succinate (TOPROL-XL) 25 MG 24 hr tablet Take 1 tablet (25 mg total) by mouth daily. 09/26/17  Yes Branch, Alphonse Guild, MD  montelukast (SINGULAIR) 10 MG tablet TAKE 1 TABLET(10 MG) BY MOUTH AT BEDTIME 09/26/17  Yes Fayrene Helper, MD  Multiple Vitamin (MULTIVITAMIN WITH MINERALS) TABS tablet Take 1 tablet by mouth daily.   Yes [provider]  oxybutynin (DITROPAN) 5 MG tablet TAKE 1/2 TABLET BY MOUTH TWICE DAILY 10/31/17  Yes Fayrene Helper, MD  pantoprazole (PROTONIX) 40 MG tablet Take 1 tablet (40 mg total) by mouth daily. 10/05/17  Yes Isaac Bliss, Rayford Halsted, MD  potassium chloride SA (K-DUR,KLOR-CON) 20 MEQ tablet TAKE 1 TABLET BY MOUTH EVERY DAY  07/11/17  Yes Branch, Alphonse Guild, MD  senna (SENOKOT) 8.6 MG TABS tablet Take 1 tablet (8.6 mg total) by mouth 2 (two) times daily. 09/26/17  Yes Regalado, Belkys A, MD  spironolactone (ALDACTONE) 25 MG tablet TAKE 1 TABLET(25 MG) BY MOUTH DAILY 07/08/17  Yes Arnoldo Lenis, MD    Allergies as of 12/06/2017 - Review Complete 12/06/2017  Allergen Reaction Noted  . Ace inhibitors Cough 11/18/2014    Family History  Problem Relation Age of Onset  . Cancer Father        prostate, deceased 37s  . Breast cancer Sister        Deceased 62s  . Heart attack Mother        deceased 19, during childbirth  . Cancer Brother        lymphoma  . Colon cancer Maternal Aunt        older than age 56  . Liver disease Neg Hx     Social History   Socioeconomic History  . Marital status: Married    Spouse name: Not on file  . Number of children: 3  . Years of education: 11th grade  . Highest education level: 11th grade  Occupational History  . Occupation: disabilty x 9years    Comment: secondary to arthritis   Social Needs  . Financial resource strain: Not very hard  . Food insecurity:    Worry: Sometimes true    Inability: Never true  . Transportation needs:    Medical: No    Non-medical: No  Tobacco Use  . Smoking status: Current Every Day Smoker    Types: Cigarettes  . Smokeless tobacco: Never Used  . Tobacco comment: quit after hospitized for hip fracture   Substance and Sexual Activity  . Alcohol use: No    Alcohol/week: 0.0 standard drinks    Comment: quit 6 years ago   . Drug use: No  . Sexual activity: Not on file  Lifestyle  . Physical activity:    Days per week: 0 days    Minutes per session: 0 min  . Stress: Not at all  Relationships  . Social connections:    Talks on phone: Once a week    Gets together: Not on file    Attends religious service: More than 4 times per year    Active member of club or organization: No    Attends meetings of clubs or organizations:  Never    Relationship status: Married  . Intimate partner violence:    Fear of current or ex partner: No    Emotionally abused: No    Physically abused: No    Forced sexual activity: No  Other Topics Concern  . Not on file  Social History Narrative   Pt gave birth to 4 children, 1 deceased at 61 weeks was a premature baby, 3 are living ages range 45 to 14 all healthy  Review of Systems: See HPI, otherwise negative ROS  Physical Exam: BP 133/78   Pulse (!) 101   Temp (!) 97.1 F (36.2 C) (Oral)   Ht 5\' 7"  (1.702 m)   BMI 23.45 kg/m  General:   Alert,  Well-developed, well-nourished, pleasant and cooperative in NAD Neck:  Supple; no masses or thyromegaly. No significant cervical adenopathy. Lungs:  Clear throughout to auscultation.   No wheezes, crackles, or rhonchi. No acute distress. Heart:  Regular rate and rhythm; no murmurs, clicks, rubs,  or gallops. Abdomen: Non-distended, normal bowel sounds.  Soft and nontender without appreciable mass or hepatosplenomegaly.  Pulses:  Normal pulses noted. Extremities:  Without clubbing or edema.  Impression/Plan:  Very pleasant 62 year old lady with history of CVA and recent hip fracture experienced self-limiting rectal bleeding earlier this year. Last colonoscopy in 2009 with benign polyp being found. As discussed with her husband, it is recommended that she have further evaluation of even, self-limited, bleeding, given her age and the length of time elapsed since her last colon examination. As I see it here, the biggest challenge to completing a colonoscopy will be adequate preparation given her limited mobility.   Recommendations: I have offered  the patient a diagnostic colonoscopy in the near future utilizing propofol. To facilitate prep, I recommend the patient be placed on observation the night before in the hospital where staff members can facilitate the preparation process. Patient and husband are in agreement to this  approach. The risks, benefits, limitations, alternatives and imponderables have been reviewed with the patient. Questions have been answered. All parties are agreeable.   Further recommendations to follow    Notice: This dictation was prepared with Dragon dictation along with smaller phrase technology. Any transcriptional errors that result from this process are unintentional and may not be corrected upon review.

## 2017-12-06 NOTE — Progress Notes (Signed)
CBC recheck for anemia.

## 2018-01-04 ENCOUNTER — Telehealth: Payer: Self-pay | Admitting: Family Medicine

## 2018-01-04 ENCOUNTER — Other Ambulatory Visit: Payer: Self-pay

## 2018-01-04 MED ORDER — PANTOPRAZOLE SODIUM 40 MG PO TBEC: 40.0000 mg | DELAYED_RELEASE_TABLET | Freq: Every day | ORAL | 5 refills | Status: DC

## 2018-01-04 NOTE — Telephone Encounter (Signed)
Med sent to [facility]

## 2018-01-04 NOTE — Telephone Encounter (Signed)
Protonix--please call this in to [facility] on [address]

## 2018-01-11 ENCOUNTER — Encounter (HOSPITAL_COMMUNITY): Payer: Self-pay

## 2018-01-11 ENCOUNTER — Other Ambulatory Visit: Payer: Self-pay

## 2018-01-11 ENCOUNTER — Observation Stay (HOSPITAL_COMMUNITY)
Admission: RE | Admit: 2018-01-11 | Discharge: 2018-01-12 | Disposition: A | Discharge status: Home / Self Care | Payer: Medicare Other | Source: Ambulatory Visit | Attending: Internal Medicine | Admitting: Internal Medicine

## 2018-01-11 DIAGNOSIS — K625 Hemorrhage of anus and rectum: Secondary | ICD-10-CM

## 2018-01-11 DIAGNOSIS — M069 Rheumatoid arthritis, unspecified: Secondary | ICD-10-CM | POA: Diagnosis not present

## 2018-01-11 DIAGNOSIS — I10 Essential (primary) hypertension: Secondary | ICD-10-CM | POA: Diagnosis present

## 2018-01-11 DIAGNOSIS — I2510 Atherosclerotic heart disease of native coronary artery without angina pectoris: Secondary | ICD-10-CM | POA: Insufficient documentation

## 2018-01-11 DIAGNOSIS — K648 Other hemorrhoids: Secondary | ICD-10-CM | POA: Insufficient documentation

## 2018-01-11 DIAGNOSIS — Z7951 Long term (current) use of inhaled steroids: Secondary | ICD-10-CM | POA: Diagnosis not present

## 2018-01-11 DIAGNOSIS — Z7982 Long term (current) use of aspirin: Secondary | ICD-10-CM | POA: Insufficient documentation

## 2018-01-11 DIAGNOSIS — E785 Hyperlipidemia, unspecified: Secondary | ICD-10-CM | POA: Diagnosis not present

## 2018-01-11 DIAGNOSIS — D539 Nutritional anemia, unspecified: Secondary | ICD-10-CM | POA: Diagnosis present

## 2018-01-11 DIAGNOSIS — Z79899 Other long term (current) drug therapy: Secondary | ICD-10-CM | POA: Diagnosis not present

## 2018-01-11 DIAGNOSIS — Z8673 Personal history of transient ischemic attack (TIA), and cerebral infarction without residual deficits: Secondary | ICD-10-CM | POA: Insufficient documentation

## 2018-01-11 DIAGNOSIS — F17210 Nicotine dependence, cigarettes, uncomplicated: Secondary | ICD-10-CM | POA: Insufficient documentation

## 2018-01-11 DIAGNOSIS — K921 Melena: Principal | ICD-10-CM | POA: Insufficient documentation

## 2018-01-11 DIAGNOSIS — K5730 Diverticulosis of large intestine without perforation or abscess without bleeding: Secondary | ICD-10-CM | POA: Insufficient documentation

## 2018-01-11 DIAGNOSIS — I69959 Hemiplegia and hemiparesis following unspecified cerebrovascular disease affecting unspecified side: Secondary | ICD-10-CM

## 2018-01-11 DIAGNOSIS — D649 Anemia, unspecified: Secondary | ICD-10-CM | POA: Diagnosis present

## 2018-01-11 DIAGNOSIS — Z955 Presence of coronary angioplasty implant and graft: Secondary | ICD-10-CM | POA: Diagnosis not present

## 2018-01-11 LAB — COMPREHENSIVE METABOLIC PANEL
ALT: 26 U/L (ref 0–44)
AST: 30 U/L (ref 15–41)
Albumin: 3.3 g/dL — ABNORMAL LOW (ref 3.5–5.0)
Alkaline Phosphatase: 89 U/L (ref 38–126)
Anion gap: 10 (ref 5–15)
BUN: 21 mg/dL (ref 8–23)
CO2: 23 mmol/L (ref 22–32)
Calcium: 9 mg/dL (ref 8.9–10.3)
Chloride: 104 mmol/L (ref 98–111)
Creatinine, Ser: 1.08 mg/dL — ABNORMAL HIGH (ref 0.44–1.00)
GFR calc Af Amer: >60 mL/min (ref >60)
GFR calc non Af Amer: 54 mL/min — ABNORMAL LOW (ref >60)
Glucose, Bld: 110 mg/dL — ABNORMAL HIGH (ref 70–99)
Potassium: 3.4 mmol/L — ABNORMAL LOW (ref 3.5–5.1)
Sodium: 137 mmol/L (ref 135–145)
Total Bilirubin: 0.6 mg/dL (ref 0.3–1.2)
Total Protein: 7.4 g/dL (ref 6.5–8.1)

## 2018-01-11 LAB — CBC
HCT: 30.1 % — ABNORMAL LOW (ref 36.0–46.0)
Hemoglobin: 9.5 g/dL — ABNORMAL LOW (ref 12.0–15.0)
MCH: 30.6 pg (ref 26.0–34.0)
MCHC: 31.6 g/dL (ref 30.0–36.0)
MCV: 97.1 fL (ref 78.0–100.0)
Platelets: 217 10*3/uL (ref 150–400)
RBC: 3.1 MIL/uL — ABNORMAL LOW (ref 3.87–5.11)
RDW: 16.4 % — ABNORMAL HIGH (ref 11.5–15.5)
WBC: 6.8 10*3/uL (ref 4.0–10.5)

## 2018-01-11 MED ORDER — PANTOPRAZOLE SODIUM 40 MG PO TBEC
40.0000 mg | DELAYED_RELEASE_TABLET | Freq: Every day | ORAL | Status: DC
  Administered 2018-01-11 – 2018-01-12 (×2): 40 mg via ORAL
  Filled 2018-01-11 (×2): qty 1

## 2018-01-11 MED ORDER — ONDANSETRON HCL 4 MG/2ML IJ SOLN: 4.0000 mg | Freq: Four times a day (QID) | INTRAMUSCULAR | Status: DC | PRN

## 2018-01-11 MED ORDER — BACLOFEN 10 MG PO TABS
10.0000 mg | ORAL_TABLET | Freq: Two times a day (BID) | ORAL | Status: DC
  Administered 2018-01-11 – 2018-01-12 (×3): 10 mg via ORAL
  Filled 2018-01-11 (×3): qty 1

## 2018-01-11 MED ORDER — ONDANSETRON HCL 4 MG PO TABS: 4.0000 mg | ORAL_TABLET | Freq: Four times a day (QID) | ORAL | Status: DC | PRN

## 2018-01-11 MED ORDER — MONTELUKAST SODIUM 10 MG PO TABS
10.0000 mg | ORAL_TABLET | Freq: Every day | ORAL | Status: DC
  Administered 2018-01-11: 10 mg via ORAL
  Filled 2018-01-11: qty 1

## 2018-01-11 MED ORDER — METOPROLOL SUCCINATE ER 25 MG PO TB24
25.0000 mg | ORAL_TABLET | Freq: Every day | ORAL | Status: DC
  Administered 2018-01-11 – 2018-01-12 (×2): 25 mg via ORAL
  Filled 2018-01-11 (×2): qty 1

## 2018-01-11 MED ORDER — LEFLUNOMIDE 10 MG PO TABS
20.0000 mg | ORAL_TABLET | Freq: Every day | ORAL | Status: DC
  Administered 2018-01-11 – 2018-01-12 (×2): 20 mg via ORAL
  Filled 2018-01-11 (×3): qty 2

## 2018-01-11 MED ORDER — PEG 3350-KCL-NABCB-NACL-NASULF 236 G PO SOLR
2000.0000 mL | Freq: Once | ORAL | Status: AC
  Administered 2018-01-11: 2000 mL via ORAL

## 2018-01-11 MED ORDER — HYDROCHLOROTHIAZIDE 25 MG PO TABS
25.0000 mg | ORAL_TABLET | Freq: Every day | ORAL | Status: DC
  Administered 2018-01-11 – 2018-01-12 (×2): 25 mg via ORAL
  Filled 2018-01-11 (×2): qty 1

## 2018-01-11 MED ORDER — SODIUM CHLORIDE 0.9 % IV SOLN: 250.0000 mL | INTRAVENOUS | Status: DC | PRN

## 2018-01-11 MED ORDER — SODIUM CHLORIDE 0.9 % IV SOLN
INTRAVENOUS | Status: DC
  Administered 2018-01-11: 15:00:00 via INTRAVENOUS

## 2018-01-11 MED ORDER — SODIUM CHLORIDE 0.9% FLUSH
3.0000 mL | Freq: Two times a day (BID) | INTRAVENOUS | Status: DC
  Administered 2018-01-11 – 2018-01-12 (×2): 3 mL via INTRAVENOUS

## 2018-01-11 MED ORDER — SPIRONOLACTONE 25 MG PO TABS
25.0000 mg | ORAL_TABLET | Freq: Every day | ORAL | Status: DC
  Administered 2018-01-11 – 2018-01-12 (×2): 25 mg via ORAL
  Filled 2018-01-11 (×2): qty 1

## 2018-01-11 MED ORDER — FLUTICASONE PROPIONATE 50 MCG/ACT NA SUSP
1.0000 | Freq: Every day | NASAL | Status: DC
  Administered 2018-01-11 – 2018-01-12 (×2): 1 via NASAL
  Filled 2018-01-11: qty 16

## 2018-01-11 MED ORDER — ACETAMINOPHEN 325 MG PO TABS: 650.0000 mg | ORAL_TABLET | Freq: Four times a day (QID) | ORAL | Status: DC | PRN

## 2018-01-11 MED ORDER — ASPIRIN EC 81 MG PO TBEC
81.0000 mg | DELAYED_RELEASE_TABLET | Freq: Every day | ORAL | Status: DC
  Administered 2018-01-11 – 2018-01-12 (×2): 81 mg via ORAL
  Filled 2018-01-11 (×2): qty 1

## 2018-01-11 MED ORDER — POTASSIUM CHLORIDE CRYS ER 20 MEQ PO TBCR
20.0000 meq | EXTENDED_RELEASE_TABLET | Freq: Two times a day (BID) | ORAL | Status: AC
  Administered 2018-01-11 – 2018-01-12 (×2): 20 meq via ORAL
  Filled 2018-01-11 (×2): qty 1

## 2018-01-11 MED ORDER — SODIUM CHLORIDE 0.9% FLUSH: 3.0000 mL | INTRAVENOUS | Status: DC | PRN

## 2018-01-11 MED ORDER — ATORVASTATIN CALCIUM 40 MG PO TABS
80.0000 mg | ORAL_TABLET | Freq: Every day | ORAL | Status: DC
  Administered 2018-01-11: 80 mg via ORAL
  Filled 2018-01-11: qty 2

## 2018-01-11 MED ORDER — HYOSCYAMINE SULFATE 0.125 MG SL SUBL
0.2500 mg | SUBLINGUAL_TABLET | Freq: Once | SUBLINGUAL | Status: AC
  Administered 2018-01-11: 0.25 mg via SUBLINGUAL
  Filled 2018-01-11: qty 2

## 2018-01-11 MED ORDER — PEG 3350-KCL-NA BICARB-NACL 420 G PO SOLR
2000.0000 mL | Freq: Once | ORAL | Status: DC
  Filled 2018-01-11: qty 4000

## 2018-01-11 MED ORDER — ACETAMINOPHEN 650 MG RE SUPP: 650.0000 mg | Freq: Four times a day (QID) | RECTAL | Status: DC | PRN

## 2018-01-11 MED ORDER — BISACODYL 5 MG PO TBEC
10.0000 mg | DELAYED_RELEASE_TABLET | Freq: Once | ORAL | Status: AC
  Administered 2018-01-11: 10 mg via ORAL
  Filled 2018-01-11: qty 2

## 2018-01-11 MED ORDER — OXYBUTYNIN CHLORIDE 5 MG PO TABS
2.5000 mg | ORAL_TABLET | Freq: Two times a day (BID) | ORAL | Status: DC
  Administered 2018-01-11 – 2018-01-12 (×3): 2.5 mg via ORAL
  Filled 2018-01-11 (×3): qty 1

## 2018-01-11 NOTE — Progress Notes (Signed)
Pt not drinking prep as she should, pt encouraged to keep drinking and educated that she needs to finish the prep so that we can get the test results.

## 2018-01-11 NOTE — H&P (View-Only) (Signed)
Discussed with Karolee Ohs, RN, patient is behind on bowel prep. Split prep was ordered for first two liters to be consumed by 1300. Second 2000 mL was ordered for 1800. Per Ms. Lissa Merlin, patient is difficult and they are encouraging compliance. Hard to get to bedside commode and patient refusing bedpan. In past four hours she has consumed about 1/4 of trilyte.   I talked with patient via phone and to niece present in room. Husband had stepped out. Explained need for her to consume 8 ounces every 15 minutes until 1/2 jug gone. Then she can take a 2 hour break and resume drinking until gone. Cannot sip. Needs to drink at rate of 8 ounces every 15-20 minutes.   I have spoke to Husband, Brizeida Mcmurry to make aware to encourage patient to drink prep.   Dr. Gala Romney aware.   Laureen Ochs. Bernarda Caffey [facility] [phone] 9/25/20194:41 PM

## 2018-01-11 NOTE — Consult Note (Signed)
Referring Provider: Rodena Goldmann, DO Primary Care Physician:  Fayrene Helper, MD Primary Gastroenterologist:  Garfield Cornea, MD  Reason for Consultation:  Inpatient bowel prep, rectal bleeding  HPI: Danielle Cruz is a 62 y.o. female admitted for assistance with bowel prep.  Seen back in August 2019 for further evaluation of rectal bleeding.  Patient had several episodes of painless rectal bleeding while on Lovenox back in [place] for right hip fracture/repair.  Rectal bleeding resolved off of Lovenox.  No iron deficiency but she did have some anemia.  Last hemoglobin on August 13th was 8.9.  No odynophagia, dysphagia, vomiting, early satiety.  Patient denies constipation. Last BM yesterday. Appetite is good. Mobility severely limited due to history of stroke and recent hip fracture.  Patient has not walked since her hip fracture back in [place].  She can transfer to bedside commode with assistance.   Prior to Admission medications   Medication Sig Start Date End Date Taking? Authorizing Provider  aspirin EC 81 MG tablet Take 81 mg by mouth daily.    [provider]  atorvastatin (LIPITOR) 40 MG tablet Take 1 tablet (40 mg total) by mouth daily. 06/30/17 12/06/17  Arnoldo Lenis, MD  atorvastatin (LIPITOR) 80 MG tablet TAKE 1 TABLET(80 MG) BY MOUTH DAILY 10/31/17   Fayrene Helper, MD  baclofen (LIORESAL) 10 MG tablet Take 10 mg by mouth 2 (two) times daily.    [provider]  benzonatate (TESSALON) 100 MG capsule Take 1 capsule (100 mg total) by mouth 2 (two) times daily as needed for cough. 10/25/17   Fayrene Helper, MD  docusate sodium (COLACE) 100 MG capsule Take 1 capsule (100 mg total) by mouth 2 (two) times daily. 09/26/17   Regalado, Belkys A, MD  ferrous sulfate 325 (65 FE) MG tablet Take 1 tablet (325 mg total) by mouth 2 (two) times daily with a meal. 09/26/17   Regalado, Belkys A, MD  fluticasone (FLONASE) 50 MCG/ACT nasal spray Place 1 spray into both nostrils  daily. 07/14/17   Fayrene Helper, MD  folic acid (FOLVITE) 1 MG tablet take one tablet daily 04/14/17   [provider]  furosemide (LASIX) 40 MG tablet TAKE 1 TABLET DAILY AS NEEDED FOR SWELLING 09/27/17   Branch, Alphonse Guild, MD  hydrochlorothiazide (HYDRODIURIL) 25 MG tablet Take 1 tablet (25 mg total) by mouth daily. 10/07/17   Arnoldo Lenis, MD  leflunomide (ARAVA) 20 MG tablet Take 20 mg by mouth daily.      [provider]  metoprolol succinate (TOPROL-XL) 25 MG 24 hr tablet Take 1 tablet (25 mg total) by mouth daily. 09/26/17   Arnoldo Lenis, MD  montelukast (SINGULAIR) 10 MG tablet TAKE 1 TABLET(10 MG) BY MOUTH AT BEDTIME 09/26/17   Fayrene Helper, MD  Multiple Vitamin (MULTIVITAMIN WITH MINERALS) TABS tablet Take 1 tablet by mouth daily.    [provider]  oxybutynin (DITROPAN) 5 MG tablet TAKE 1/2 TABLET BY MOUTH TWICE DAILY 10/31/17   Fayrene Helper, MD  pantoprazole (PROTONIX) 40 MG tablet Take 1 tablet (40 mg total) by mouth daily. 01/04/18   Fayrene Helper, MD  potassium chloride SA (K-DUR,KLOR-CON) 20 MEQ tablet TAKE 1 TABLET BY MOUTH EVERY DAY 07/11/17   Arnoldo Lenis, MD  senna (SENOKOT) 8.6 MG TABS tablet Take 1 tablet (8.6 mg total) by mouth 2 (two) times daily. 09/26/17   Regalado, Belkys A, MD  spironolactone (ALDACTONE) 25 MG tablet TAKE  1 TABLET(25 MG) BY MOUTH DAILY 07/08/17   Arnoldo Lenis, MD    No current facility-administered medications for this encounter.     Allergies as of 01/10/2018 - Review Complete 12/06/2017  Allergen Reaction Noted  . Ace inhibitors Cough 11/18/2014    Past Medical History:  Diagnosis Date  . AKI (acute kidney injury) ([place]) 09/22/2017  . Anemia   . 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 Arthritis   . Colonic polyp 2010   Hemorrhoids; h/o mild hematochezia  . CVA (cerebral infarction) 08/2008   sizable left  -residual expressive aphasia, right sided weakness; ambulates with difficulty with the right leg brace   . Hyperlipidemia   . Hypertension 2005  . Rheumatoid arthritis(714.0)   . Stroke [facility])     Past Surgical History:  Procedure Laterality Date  . ANKLE SURGERY     Right  . CAROTID STENT INSERTION    . COLONOSCOPY W/ POLYPECTOMY  11/2008   Dr. Gala Romney. Left-sided diverticula, Pedunculated polyp snared (no adenomatous changes)  . FEMUR IM NAIL Right 09/23/2017   Procedure: INTRAMEDULLARY (IM) NAIL FEMORAL;  Surgeon: Renette Butters, MD;  Location: [place];  Service: Orthopedics;  Laterality: Right;  . OOPHORECTOMY      Family History  Problem Relation Age of Onset  . Cancer Father        prostate, deceased 53s  . Breast cancer Sister        Deceased 72s  . Heart attack Mother        deceased 23, during childbirth  . Cancer Brother        lymphoma  . Colon cancer Maternal Aunt        older than age 82  . Liver disease Neg Hx     Social History   Socioeconomic History  . Marital status: Married    Spouse name: Not on file  . Number of children: 3  . Years of education: 11th grade  . Highest education level: 11th grade  Occupational History  . Occupation: disabilty x 9years    Comment: secondary to arthritis   Social Needs  . Financial resource strain: Not very hard  . Food insecurity:    Worry: Sometimes true    Inability: Never true  . Transportation needs:    Medical: No    Non-medical: No  Tobacco Use  . Smoking status: Current Every Day Smoker    Types: Cigarettes  . Smokeless tobacco: Never Used  . Tobacco comment: quit after hospitized for hip fracture   Substance and Sexual Activity  . Alcohol use: No    Alcohol/week: 0.0 standard drinks    Comment: quit 6 years ago   . Drug use: No  . Sexual activity: Not on file  Lifestyle  . Physical activity:    Days per week: 0 days    Minutes per session: 0 min  . Stress: Not at all  Relationships  . Social  connections:    Talks on phone: Once a week    Gets together: Not on file    Attends religious service: More than 4 times per year    Active member of club or organization: No    Attends meetings of clubs or organizations: Never    Relationship status: Married  . Intimate partner violence:    Fear of current or ex partner: No    Emotionally abused: No  Physically abused: No    Forced sexual activity: No  Other Topics Concern  . Not on file  Social History Narrative   Pt gave birth to 4 children, 1 deceased at 68 weeks was a premature baby, 3 are living ages range 40 to 66 all healthy      ROS:  General: Negative for anorexia, weight loss, fever, chills, fatigue, weakness. Eyes: Negative for vision changes.  ENT: Negative for hoarseness, difficulty swallowing , nasal congestion. CV: Negative for chest pain, angina, palpitations, dyspnea on exertion, peripheral edema.  Respiratory: Negative for dyspnea at rest, dyspnea on exertion, cough, sputum, wheezing.  GI: See history of present illness. GU:  Negative for dysuria, hematuria, urinary incontinence, urinary frequency, nocturnal urination.  MS: Negative for joint pain, low back pain.  Derm: Negative for rash or itching.  Neuro: Negative for weakness, abnormal sensation, seizure, frequent headaches, memory loss, confusion.  Limited ability to speak since stroke 10 years ago.  Right-sided hemiparesis.  For 10 years Psych: Negative for anxiety, depression, suicidal ideation, hallucinations.  Endo: Negative for unusual weight change.  Heme: Negative for bruising or bleeding. Allergy: Negative for rash or hives.       Physical Examination: Vital signs in last 24 hours: Pulse Rate:  [86] 86 (09/25 0834) Resp:  [18] 18 (09/25 0834) BP: (148)/(77) 148/77 (09/25 0834) SpO2:  [100 %] 100 % (09/25 0834)    General: Well-nourished, well-developed in no acute distress.  Limited speech, answers yes and no Head: Normocephalic,  atraumatic.   Eyes: Conjunctiva pink, no icterus. Mouth: Oropharyngeal mucosa moist and pink , no lesions erythema or exudate. Neck: Supple without thyromegaly, masses, or lymphadenopathy.  Lungs: Clear to auscultation bilaterally.  Heart: Regular rate and rhythm, no murmurs rubs or gallops.  Abdomen: Bowel sounds are normal, nontender, nondistended, no hepatosplenomegaly or masses, no abdominal bruits or    hernia , no rebound or guarding.   Rectal: Not performed Extremities: No lower extremity edema, clubbing, deformity.  Neuro: Alert and oriented x 4 , right arm weakness Skin: Warm and dry, no rash or jaundice.   Psych: Alert and cooperative, normal mood and affect.        Intake/Output from previous day: No intake/output data recorded. Intake/Output this shift: No intake/output data recorded.  Lab Results: CBC No results for input(s): WBC, HGB, HCT, MCV, PLT in the last 72 hours. BMET No results for input(s): NA, K, CL, CO2, GLUCOSE, BUN, CREATININE, CALCIUM in the last 72 hours. LFT No results for input(s): BILITOT, BILIDIR, IBILI, ALKPHOS, AST, ALT, PROT, ALBUMIN in the last 72 hours.  Lipase No results for input(s): LIPASE in the last 72 hours.  PT/INR No results for input(s): LABPROT, INR in the last 72 hours.    Imaging Studies: No results found.Minnie.Brome week]   Impression: Very pleasant 62 year old female with history of stroke and recent hip fracture who experienced self-limited rectal bleeding earlier this year while on Lovenox.  Last colonoscopy in 2009 with benign polyp found.  Recommended patient have colonoscopy but given limited mobility plans were made for patient to come in for bowel preparation today. She has anemia but no iron deficiency. Will update labs today.   Plan: 1. Colonoscopy planned for tomorrow with propofol.  We would like to thank you for the opportunity to participate in the care of Kimberle C Jawad.   Laureen Ochs. Bernarda Caffey [facility] [phone] 9/25/20199:50 AM    LOS: 0 days

## 2018-01-11 NOTE — H&P (Signed)
History and Physical    Danielle Cruz XBJ:[id number] DOB: 1955-11-05 DOA: 01/11/2018  PCP: Fayrene Helper, MD   Patient coming from: Home  Chief Complaint: Rectal bleed  HPI: Danielle Cruz is a 62 y.o. female with medical history significant for prior CVA, hypertension, dyslipidemia, rheumatoid arthritis, anemia, ongoing tobacco abuse, and painless rectal bleeding that was evaluated by GI on 11/2017.  This was noted to have started with the use of Lovenox in [place] after her right hip fracture/repair.  Her bleeding had resolved after Lovenox was discontinued and she was noted not to have any iron deficiency anemia.  She is being admitted to the hospital for colon prep with plans for colonoscopy in a.m due to the fact that her mobility is severely limited with history of stroke and hip fracture.  She currently denies any symptomatic complaints or concerns and has no abdominal pain, nausea, vomiting, dysphagia, or diarrhea.  Her last BM was yesterday.  She has not taken her home medications this morning.  Review of Systems: All others reviewed and otherwise negative.  Past Medical History:  Diagnosis Date  . AKI (acute kidney injury) ([place]) 09/22/2017  . Anemia   . 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 Arthritis   . Colonic polyp 2010   Hemorrhoids; h/o mild hematochezia  . CVA (cerebral infarction) 08/2008   sizable left -residual expressive aphasia, right sided weakness; ambulates with difficulty with the right leg brace   . Hyperlipidemia   . Hypertension 2005  . Rheumatoid arthritis(714.0)   . Stroke [facility])     Past Surgical History:  Procedure Laterality Date  . ANKLE SURGERY     Right  . CAROTID STENT INSERTION    . COLONOSCOPY W/ POLYPECTOMY  11/2008   Dr. Gala Romney. Left-sided diverticula, Pedunculated polyp snared (no adenomatous changes)  . FEMUR IM NAIL Right 09/23/2017   Procedure: INTRAMEDULLARY (IM) NAIL  FEMORAL;  Surgeon: Renette Butters, MD;  Location: [place];  Service: Orthopedics;  Laterality: Right;  . OOPHORECTOMY       reports that she has been smoking cigarettes. She has never used smokeless tobacco. She reports that she does not drink alcohol or use drugs.  Allergies  Allergen Reactions  . Ace Inhibitors Cough    New daily cough since starting ACE    Family History  Problem Relation Age of Onset  . Cancer Father        prostate, deceased 43s  . Breast cancer Sister        Deceased 65s  . Heart attack Mother        deceased 38, during childbirth  . Cancer Brother        lymphoma  . Colon cancer Maternal Aunt        older than age 84  . Liver disease Neg Hx     Prior to Admission medications   Medication Sig Start Date End Date Taking? Authorizing Provider  aspirin EC 81 MG tablet Take 81 mg by mouth daily.    [provider]  atorvastatin (LIPITOR) 40 MG tablet Take 1 tablet (40 mg total) by mouth daily. 06/30/17 12/06/17  Arnoldo Lenis, MD  atorvastatin (LIPITOR) 80 MG tablet TAKE 1 TABLET(80 MG) BY MOUTH DAILY 10/31/17   Fayrene Helper, MD  baclofen (LIORESAL) 10 MG tablet Take 10 mg by mouth 2 (two) times daily.    [provider]  benzonatate (TESSALON) 100 MG capsule Take 1 capsule (100 mg total) by mouth 2 (two) times daily as needed for cough. 10/25/17   Fayrene Helper, MD  docusate sodium (COLACE) 100 MG capsule Take 1 capsule (100 mg total) by mouth 2 (two) times daily. 09/26/17   Regalado, Belkys A, MD  ferrous sulfate 325 (65 FE) MG tablet Take 1 tablet (325 mg total) by mouth 2 (two) times daily with a meal. 09/26/17   Regalado, Belkys A, MD  fluticasone (FLONASE) 50 MCG/ACT nasal spray Place 1 spray into both nostrils daily. 07/14/17   Fayrene Helper, MD  folic acid (FOLVITE) 1 MG tablet take one tablet daily 04/14/17   [provider]  furosemide (LASIX) 40 MG tablet TAKE 1 TABLET DAILY AS NEEDED FOR SWELLING 09/27/17    Branch, Alphonse Guild, MD  hydrochlorothiazide (HYDRODIURIL) 25 MG tablet Take 1 tablet (25 mg total) by mouth daily. 10/07/17   Arnoldo Lenis, MD  leflunomide (ARAVA) 20 MG tablet Take 20 mg by mouth daily.      [provider]  metoprolol succinate (TOPROL-XL) 25 MG 24 hr tablet Take 1 tablet (25 mg total) by mouth daily. 09/26/17   Arnoldo Lenis, MD  montelukast (SINGULAIR) 10 MG tablet TAKE 1 TABLET(10 MG) BY MOUTH AT BEDTIME 09/26/17   Fayrene Helper, MD  Multiple Vitamin (MULTIVITAMIN WITH MINERALS) TABS tablet Take 1 tablet by mouth daily.    [provider]  oxybutynin (DITROPAN) 5 MG tablet TAKE 1/2 TABLET BY MOUTH TWICE DAILY 10/31/17   Fayrene Helper, MD  pantoprazole (PROTONIX) 40 MG tablet Take 1 tablet (40 mg total) by mouth daily. 01/04/18   Fayrene Helper, MD  potassium chloride SA (K-DUR,KLOR-CON) 20 MEQ tablet TAKE 1 TABLET BY MOUTH EVERY DAY 07/11/17   Arnoldo Lenis, MD  senna (SENOKOT) 8.6 MG TABS tablet Take 1 tablet (8.6 mg total) by mouth 2 (two) times daily. 09/26/17   Regalado, Belkys A, MD  spironolactone (ALDACTONE) 25 MG tablet TAKE 1 TABLET(25 MG) BY MOUTH DAILY 07/08/17   Arnoldo Lenis, MD    Physical Exam: Vitals:   01/11/18 0834  BP: (!) 148/77  Pulse: 86  Resp: 18  SpO2: 100%    Constitutional: NAD, calm, comfortable Vitals:   01/11/18 0834  BP: (!) 148/77  Pulse: 86  Resp: 18  SpO2: 100%   Eyes: lids and conjunctivae normal ENMT: Mucous membranes are moist.  Neck: normal, supple Respiratory: clear to auscultation bilaterally. Normal respiratory effort. No accessory muscle use.  Cardiovascular: Regular rate and rhythm, no murmurs. No extremity edema. Abdomen: no tenderness, no distention. Bowel sounds positive.  Musculoskeletal:  No joint deformity upper and lower extremities.   Skin: no rashes, lesions, ulcers.  Psychiatric: Normal judgment and insight. Alert and oriented x 3. Normal mood.   Labs on  Admission: I have personally reviewed following labs and imaging studies  CBC: No results for input(s): WBC, NEUTROABS, HGB, HCT, MCV, PLT in the last 168 hours. Basic Metabolic Panel: No results for input(s): NA, K, CL, CO2, GLUCOSE, BUN, CREATININE, CALCIUM, MG, PHOS in the last 168 hours. GFR: CrCl cannot be calculated (Patient's most recent lab result is older than the maximum 21 days allowed.). Liver Function Tests: No results for input(s): AST, ALT, ALKPHOS, BILITOT, PROT, ALBUMIN in the last 168 hours. No results for input(s): LIPASE, AMYLASE in the last 168 hours. No results for input(s): AMMONIA in the last 168  hours. Coagulation Profile: No results for input(s): INR, PROTIME in the last 168 hours. Cardiac Enzymes: No results for input(s): CKTOTAL, CKMB, CKMBINDEX, TROPONINI in the last 168 hours. BNP (last 3 results) No results for input(s): PROBNP in the last 8760 hours. HbA1C: No results for input(s): HGBA1C in the last 72 hours. CBG: No results for input(s): GLUCAP in the last 168 hours. Lipid Profile: No results for input(s): CHOL, HDL, LDLCALC, TRIG, CHOLHDL, LDLDIRECT in the last 72 hours. Thyroid Function Tests: No results for input(s): TSH, T4TOTAL, FREET4, T3FREE, THYROIDAB in the last 72 hours. Anemia Panel: No results for input(s): VITAMINB12, FOLATE, FERRITIN, TIBC, IRON, RETICCTPCT in the last 72 hours. Urine analysis:    Component Value Date/Time   COLORURINE YELLOW 04/28/2009 0825   APPEARANCEUR CLEAR 04/28/2009 0825   LABSPEC 1.020 04/28/2009 0825   PHURINE 6.5 04/28/2009 0825   GLUCOSEU NEGATIVE 04/28/2009 0825   HGBUR NEGATIVE 04/28/2009 0825   BILIRUBINUR NEGATIVE 04/28/2009 0825   KETONESUR NEGATIVE 04/28/2009 0825   PROTEINUR NEGATIVE 04/28/2009 0825   UROBILINOGEN 0.2 04/28/2009 0825   NITRITE NEGATIVE 04/28/2009 0825   LEUKOCYTESUR  04/28/2009 0825    NEGATIVE MICROSCOPIC NOT DONE ON URINES WITH NEGATIVE PROTEIN, BLOOD, LEUKOCYTES, NITRITE,  OR GLUCOSE <1000 mg/dL.    Radiological Exams on Admission: No results found.   Assessment/Plan Principal Problem:   Rectal bleeding Active Problems:   Hyperlipemia   Anemia   Essential hypertension   Hemiplegia, late effect of cerebrovascular disease (HCC)   Rheumatoid arthritis ([place])    1. Painless rectal bleeding- resolved.  This was initiated with the use of Lovenox after recent hip surgery.  Last colonoscopy in 2009 with benign polyp noted.  GI to perform bowel preparation as well as colonoscopy in a.m.  Labs have been ordered to follow anemia as well as other electrolytes.  Maintain on clear liquid diet. 2. Dyslipidemia.  Continue atorvastatin daily. 3. Prior CVA.  Continue aspirin and statin. 4. Hypertension.  Continue on metoprolol, spironolactone, and HCTZ.  Hold potassium supplementation until electrolytes resolved.  Hold Lasix and use only as needed. 5. Rheumatoid arthritis.  Maintain on [place].   DVT prophylaxis: SCDs Code Status: Full Family Communication: Husband at bedside Disposition Plan: Plan for colon prep and colonoscopy in a.m. Consults called: GI Admission status: Observation, MedSurg   Rodena Goldmann DO [facility] Pager [phone]  If 7PM-7AM, please contact night-coverage [email or web address] Password [email or web address]  01/11/2018, 10:03 AM

## 2018-01-11 NOTE — Progress Notes (Signed)
Discussed with Karolee Ohs, RN, patient is behind on bowel prep. Split prep was ordered for first two liters to be consumed by 1300. Second 2000 mL was ordered for 1800. Per Ms. Lissa Merlin, patient is difficult and they are encouraging compliance. Hard to get to bedside commode and patient refusing bedpan. In past four hours she has consumed about 1/4 of trilyte.   I talked with patient via phone and to niece present in room. Husband had stepped out. Explained need for her to consume 8 ounces every 15 minutes until 1/2 jug gone. Then she can take a 2 hour break and resume drinking until gone. Cannot sip. Needs to drink at rate of 8 ounces every 15-20 minutes.   I have spoke to Husband, Kamarah Bilotta to make aware to encourage patient to drink prep.   Dr. Gala Romney aware.   Laureen Ochs. Bernarda Caffey [facility] [phone] 9/25/20194:41 PM

## 2018-01-12 ENCOUNTER — Encounter (HOSPITAL_COMMUNITY)
Admission: RE | Disposition: A | Discharge status: Home / Self Care | Payer: Self-pay | Source: Ambulatory Visit | Attending: Internal Medicine

## 2018-01-12 ENCOUNTER — Observation Stay (HOSPITAL_COMMUNITY): Payer: Medicare Other | Admitting: Anesthesiology

## 2018-01-12 DIAGNOSIS — Z7982 Long term (current) use of aspirin: Secondary | ICD-10-CM | POA: Diagnosis not present

## 2018-01-12 DIAGNOSIS — K625 Hemorrhage of anus and rectum: Secondary | ICD-10-CM | POA: Diagnosis not present

## 2018-01-12 DIAGNOSIS — M069 Rheumatoid arthritis, unspecified: Secondary | ICD-10-CM | POA: Diagnosis not present

## 2018-01-12 DIAGNOSIS — E785 Hyperlipidemia, unspecified: Secondary | ICD-10-CM | POA: Diagnosis not present

## 2018-01-12 DIAGNOSIS — Z79899 Other long term (current) drug therapy: Secondary | ICD-10-CM | POA: Diagnosis not present

## 2018-01-12 DIAGNOSIS — I10 Essential (primary) hypertension: Secondary | ICD-10-CM | POA: Diagnosis not present

## 2018-01-12 DIAGNOSIS — Z955 Presence of coronary angioplasty implant and graft: Secondary | ICD-10-CM | POA: Diagnosis not present

## 2018-01-12 DIAGNOSIS — Z8673 Personal history of transient ischemic attack (TIA), and cerebral infarction without residual deficits: Secondary | ICD-10-CM | POA: Diagnosis not present

## 2018-01-12 DIAGNOSIS — F17210 Nicotine dependence, cigarettes, uncomplicated: Secondary | ICD-10-CM | POA: Diagnosis not present

## 2018-01-12 DIAGNOSIS — K921 Melena: Secondary | ICD-10-CM | POA: Diagnosis not present

## 2018-01-12 DIAGNOSIS — K648 Other hemorrhoids: Secondary | ICD-10-CM | POA: Diagnosis not present

## 2018-01-12 DIAGNOSIS — K5730 Diverticulosis of large intestine without perforation or abscess without bleeding: Secondary | ICD-10-CM

## 2018-01-12 DIAGNOSIS — I2510 Atherosclerotic heart disease of native coronary artery without angina pectoris: Secondary | ICD-10-CM | POA: Diagnosis not present

## 2018-01-12 HISTORY — PX: COLONOSCOPY WITH PROPOFOL: SHX5780

## 2018-01-12 SURGERY — COLONOSCOPY WITH PROPOFOL
Anesthesia: Monitor Anesthesia Care

## 2018-01-12 MED ORDER — LACTATED RINGERS IV SOLN
INTRAVENOUS | Status: DC
  Administered 2018-01-12: 12:00:00 via INTRAVENOUS

## 2018-01-12 MED ORDER — PROPOFOL 500 MG/50ML IV EMUL
INTRAVENOUS | Status: DC | PRN
  Administered 2018-01-12: 150 ug/kg/min via INTRAVENOUS

## 2018-01-12 MED ORDER — HYDROCODONE-ACETAMINOPHEN 7.5-325 MG PO TABS: 1.0000 | ORAL_TABLET | Freq: Once | ORAL | Status: DC | PRN

## 2018-01-12 MED ORDER — PROMETHAZINE HCL 25 MG/ML IJ SOLN: 6.2500 mg | INTRAMUSCULAR | Status: DC | PRN

## 2018-01-12 MED ORDER — HYDROMORPHONE HCL 1 MG/ML IJ SOLN: 0.2500 mg | INTRAMUSCULAR | Status: DC | PRN

## 2018-01-12 MED ORDER — LACTATED RINGERS IV SOLN: INTRAVENOUS | Status: DC

## 2018-01-12 MED ORDER — PROPOFOL 10 MG/ML IV BOLUS
INTRAVENOUS | Status: DC | PRN
  Administered 2018-01-12: 10 mg via INTRAVENOUS
  Administered 2018-01-12: 40 mg via INTRAVENOUS
  Administered 2018-01-12: 20 mg via INTRAVENOUS

## 2018-01-12 MED ORDER — MEPERIDINE HCL 100 MG/ML IJ SOLN: 6.2500 mg | INTRAMUSCULAR | Status: DC | PRN

## 2018-01-12 NOTE — Anesthesia Procedure Notes (Signed)
Procedure Name: MAC Date/Time: 01/12/2018 1:08 PM Performed by: Vista Deck, CRNA Pre-anesthesia Checklist: Patient identified, Emergency Drugs available, Suction available, Timeout performed and Patient being monitored Patient Re-evaluated:Patient Re-evaluated prior to induction Oxygen Delivery Method: Nasal Cannula

## 2018-01-12 NOTE — Anesthesia Postprocedure Evaluation (Signed)
Anesthesia Post Note  Patient: Danielle Cruz  Procedure(s) Performed: COLONOSCOPY WITH PROPOFOL (N/A )  Patient location during evaluation: PACU Anesthesia Type: MAC Level of consciousness: awake and alert and patient cooperative Pain management: satisfactory to patient Vital Signs Assessment: post-procedure vital signs reviewed and stable Respiratory status: spontaneous breathing Cardiovascular status: stable Postop Assessment: no apparent nausea or vomiting Anesthetic complications: no     Last Vitals:  Vitals:   01/12/18 1400 01/12/18 1409  BP:  134/81  Pulse: 63 60  Resp: 19 17  Temp:  36.6 C  SpO2: 100% 97%    Last Pain:  Vitals:   01/12/18 1345  TempSrc:   PainSc: 0-No pain                 Zelphia Glover

## 2018-01-12 NOTE — Discharge Summary (Signed)
Physician Discharge Summary  Danielle Cruz GMW:[id number] DOB: [birth year] DOA: 01/11/2018  PCP: Fayrene Helper, MD  Admit date: 01/11/2018  Discharge date: 01/12/2018  Admitted From:Home  Disposition:  Home  Recommendations for Outpatient Follow-up:  1. Follow up with PCP in 1-2 weeks 2. Repeat BMP in 1 week to reassess creatinine level 3. Follow-up with GI in 3 months and continue on Benefiber as prescribed by GI.  Home Health: None  Equipment/Devices: None  Discharge Condition: Stable  CODE STATUS: Full  Diet recommendation: Heart Healthy  Brief/Interim Summary:  Danielle Cruz is a 62 y.o. female with medical history significant for prior CVA, hypertension, dyslipidemia, rheumatoid arthritis, anemia, ongoing tobacco abuse, and painless rectal bleeding that was evaluated by GI on 11/2017.  This was noted to have started with the use of Lovenox in [place] after her right hip fracture/repair.  Her bleeding had resolved after Lovenox was discontinued and she was noted not to have any iron deficiency anemia.  She was admitted to the hospital for colonoscopy preparation and did undergo colonoscopy with Dr. Gala Romney on 9/26 with findings of hemorrhoids and diverticulosis with no active bleeding.  She is to remain on Benefiber as recommended by GI and follow-up in 3 months.  She did have some mild AKI while here and was placed on some IV fluid.  She will require repeat labs in 1 week and has been taken off her HCTZ, Lasix, and spironolactone until she has further follow-up with her PCP.  She is to return to the hospital if she has any further bleeding episodes or symptomatology.  No other acute events have been noted during the course of this admission.  Discharge Diagnoses:  Principal Problem:   Rectal bleeding Active Problems:   Hyperlipemia   Anemia   Essential hypertension   Hemiplegia, late effect of cerebrovascular disease ([place])   Rheumatoid arthritis ([place])   Rectal  bleed    Discharge Instructions  Discharge Instructions    Diet - low sodium heart healthy   Complete by:  As directed    Increase activity slowly   Complete by:  As directed      Allergies as of 01/12/2018      Reactions   Ace Inhibitors Cough   New daily cough since starting ACE      Medication List    STOP taking these medications   furosemide 40 MG tablet Commonly known as:  LASIX   hydrochlorothiazide 25 MG tablet Commonly known as:  HYDRODIURIL   spironolactone 25 MG tablet Commonly known as:  ALDACTONE     TAKE these medications   aspirin EC 81 MG tablet Take 81 mg by mouth daily.   atorvastatin 80 MG tablet Commonly known as:  LIPITOR TAKE 1 TABLET(80 MG) BY MOUTH DAILY   docusate sodium 100 MG capsule Commonly known as:  COLACE Take 1 capsule (100 mg total) by mouth 2 (two) times daily. What changed:  when to take this   ferrous sulfate 325 (65 FE) MG tablet Take 1 tablet (325 mg total) by mouth 2 (two) times daily with a meal.   fluticasone 50 MCG/ACT nasal spray Commonly known as:  FLONASE Place 1 spray into both nostrils daily.   folic acid 1 MG tablet Commonly known as:  FOLVITE take one tablet daily   HYDROcodone-acetaminophen 5-325 MG tablet Commonly known as:  NORCO/VICODIN Take 1 tablet by mouth daily as needed.   leflunomide 20 MG tablet Commonly known as:  ARAVA Take  20 mg by mouth daily.   metoprolol succinate 25 MG 24 hr tablet Commonly known as:  TOPROL-XL Take 1 tablet (25 mg total) by mouth daily.   montelukast 10 MG tablet Commonly known as:  SINGULAIR TAKE 1 TABLET(10 MG) BY MOUTH AT BEDTIME   multivitamin with minerals Tabs tablet Take 1 tablet by mouth daily.   oxybutynin 5 MG tablet Commonly known as:  DITROPAN TAKE 1/2 TABLET BY MOUTH TWICE DAILY   pantoprazole 40 MG tablet Commonly known as:  PROTONIX Take 1 tablet (40 mg total) by mouth daily.   potassium chloride SA 20 MEQ tablet Commonly known as:   K-DUR,KLOR-CON TAKE 1 TABLET BY MOUTH EVERY DAY      Follow-up Information    Fayrene Helper, MD Follow up in 1 week(s).   Specialty:  Family Medicine Why:  Repeat BMP in office. Contact information: [address], [address] [place] [address] [phone]          Allergies  Allergen Reactions  . Ace Inhibitors Cough    New daily cough since starting ACE    Consultations:  GI   Procedures/Studies: Colonoscopy on 9/26 by Dr. Gala Romney  Discharge Exam: Vitals:   01/12/18 1400 01/12/18 1409  BP:  134/81  Pulse: 63 60  Resp: 19 17  Temp:  97.8 F (36.6 C)  SpO2: 100% 97%   Vitals:   01/12/18 1339 01/12/18 1345 01/12/18 1400 01/12/18 1409  BP: 110/74 130/66  134/81  Pulse: 68 63 63 60  Resp: (!) 22 16 19 17   Temp: 97.8 F (36.6 C)   97.8 F (36.6 C)  TempSrc:      SpO2: 100% 100% 100% 97%    General: Pt is alert, awake, not in acute distress Cardiovascular: RRR, S1/S2 +, no rubs, no gallops Respiratory: CTA bilaterally, no wheezing, no rhonchi Abdominal: Soft, NT, ND, bowel sounds + Extremities: no edema, no cyanosis    The results of significant diagnostics from this hospitalization (including imaging, microbiology, ancillary and laboratory) are listed below for reference.     Microbiology: No results found for this or any previous visit (from the past 240 hour(s)).   Labs: BNP (last 3 results) No results for input(s): BNP in the last 8760 hours. Basic Metabolic Panel: Recent Labs  Lab 01/11/18 1209  NA 137  K 3.4*  CL 104  CO2 23  GLUCOSE 110*  BUN 21  CREATININE 1.08*  CALCIUM 9.0   Liver Function Tests: Recent Labs  Lab 01/11/18 1209  AST 30  ALT 26  ALKPHOS 89  BILITOT 0.6  PROT 7.4  ALBUMIN 3.3*   No results for input(s): LIPASE, AMYLASE in the last 168 hours. No results for input(s): AMMONIA in the last 168 hours. CBC: Recent Labs  Lab 01/11/18 1209  WBC 6.8  HGB 9.5*  HCT 30.1*  MCV 97.1  PLT 217    Cardiac Enzymes: No results for input(s): CKTOTAL, CKMB, CKMBINDEX, TROPONINI in the last 168 hours. BNP: Invalid input(s): POCBNP CBG: No results for input(s): GLUCAP in the last 168 hours. D-Dimer No results for input(s): DDIMER in the last 72 hours. Hgb A1c No results for input(s): HGBA1C in the last 72 hours. Lipid Profile No results for input(s): CHOL, HDL, LDLCALC, TRIG, CHOLHDL, LDLDIRECT in the last 72 hours. Thyroid function studies No results for input(s): TSH, T4TOTAL, T3FREE, THYROIDAB in the last 72 hours.  Invalid input(s): FREET3 Anemia work up No results for input(s): VITAMINB12, FOLATE, FERRITIN,  TIBC, IRON, RETICCTPCT in the last 72 hours. Urinalysis    Component Value Date/Time   COLORURINE YELLOW 04/28/2009 0825   APPEARANCEUR CLEAR 04/28/2009 0825   LABSPEC 1.020 04/28/2009 0825   PHURINE 6.5 04/28/2009 0825   GLUCOSEU NEGATIVE 04/28/2009 0825   HGBUR NEGATIVE 04/28/2009 0825   BILIRUBINUR NEGATIVE 04/28/2009 0825   KETONESUR NEGATIVE 04/28/2009 0825   PROTEINUR NEGATIVE 04/28/2009 0825   UROBILINOGEN 0.2 04/28/2009 0825   NITRITE NEGATIVE 04/28/2009 0825   LEUKOCYTESUR  04/28/2009 0825    NEGATIVE MICROSCOPIC NOT DONE ON URINES WITH NEGATIVE PROTEIN, BLOOD, LEUKOCYTES, NITRITE, OR GLUCOSE <1000 mg/dL.   Sepsis Labs Invalid input(s): PROCALCITONIN,  WBC,  LACTICIDVEN Microbiology No results found for this or any previous visit (from the past 240 hour(s)).   Time coordinating discharge: 35 minutes  SIGNED:   Rodena Goldmann, DO [facility] 01/12/2018, 2:12 PM Pager [phone]  If 7PM-7AM, please contact night-coverage [email or web address] Password [email or web address]

## 2018-01-12 NOTE — Care Management Obs Status (Signed)
[place] NOTIFICATION   Patient Details  Name: Destanie ZENITH KERCHEVAL MRN: [id number] Date of Birth: [birth year]   Medicare Observation Status Notification Given:  Yes    Monserath Neff, Chauncey Reading, RN 01/12/2018, 7:41 AM

## 2018-01-12 NOTE — Progress Notes (Signed)
IV removed and discharge instructions reviewed with patient and husband.

## 2018-01-12 NOTE — Anesthesia Preprocedure Evaluation (Signed)
Anesthesia Evaluation  Patient identified by MRN, date of birth, ID band Patient awake    Reviewed: Allergy & Precautions, H&P , NPO status , Patient's Chart, lab work & pertinent test results, reviewed documented beta blocker date and time   Airway Mallampati: II  TM Distance: >3 FB Neck ROM: full    Dental no notable dental hx. (+) Edentulous Upper, Edentulous Lower   Pulmonary neg pulmonary ROS, Current Smoker,    Pulmonary exam normal breath sounds clear to auscultation       Cardiovascular Exercise Tolerance: Good hypertension, + CAD  negative cardio ROS   Rhythm:regular Rate:Normal     Neuro/Psych CVA negative neurological ROS  negative psych ROS   GI/Hepatic negative GI ROS, Neg liver ROS,   Endo/Other  negative endocrine ROS  Renal/GU ARFRenal diseasenegative Renal ROS  negative genitourinary   Musculoskeletal   Abdominal   Peds  Hematology negative hematology ROS (+) anemia ,   Anesthesia Other Findings   Reproductive/Obstetrics negative OB ROS                             Anesthesia Physical Anesthesia Plan  ASA: IV  Anesthesia Plan: MAC   Post-op Pain Management:    Induction:   PONV Risk Score and Plan:   Airway Management Planned:   Additional Equipment:   Intra-op Plan:   Post-operative Plan:   Informed Consent: I have reviewed the patients History and Physical, chart, labs and discussed the procedure including the risks, benefits and alternatives for the proposed anesthesia with the patient or authorized representative who has indicated his/her understanding and acceptance.     Plan Discussed with: CRNA and Anesthesiologist  Anesthesia Plan Comments:         Anesthesia Quick Evaluation

## 2018-01-12 NOTE — Transfer of Care (Signed)
Immediate Anesthesia Transfer of Care Note  Patient: Danielle Cruz  Procedure(s) Performed: COLONOSCOPY WITH PROPOFOL (N/A )  Patient Location: PACU  Anesthesia Type:MAC  Level of Consciousness: awake, alert  and patient cooperative  Airway & Oxygen Therapy: Patient Spontanous Breathing  Post-op Assessment: Report given to RN and Post -op Vital signs reviewed and stable  Post vital signs: Reviewed and stable  Last Vitals:  Vitals Value Taken Time  BP    Temp    Pulse 64 01/12/2018  1:40 PM  Resp    SpO2 79 % 01/12/2018  1:40 PM  Vitals shown include unvalidated device data.  Last Pain:  Vitals:   01/12/18 1312  TempSrc:   PainSc: 0-No pain         Complications: No apparent anesthesia complications

## 2018-01-12 NOTE — Progress Notes (Signed)
Spoke with Dr. Manuella Ghazi. Colonoscopy completed, ok to d/c to home on Benefiber daily. Follow-up in 3 months with our office. Will contact our staff the schedule appointment with the patient.  Thank you for allowing [place] to participate in the care of Danielle [place], DNP, AGNP-C Adult & Gerontological Nurse Practitioner [facility]

## 2018-01-12 NOTE — Interval H&P Note (Signed)
History and Physical Interval Note:  01/12/2018 1:11 PM  Danielle Cruz  has presented today for surgery, with the diagnosis of rectal bleeding  The various methods of treatment have been discussed with the patient and family. After consideration of risks, benefits and other options for treatment, the patient has consented to  Procedure(s): COLONOSCOPY WITH PROPOFOL (N/A) as a surgical intervention .  The patient's history has been reviewed, patient examined, no change in status, stable for surgery.  I have reviewed the patient's chart and labs.  Questions were answered to the patient's satisfaction.     Patient seen and examined in short stay.  Ready for colonoscopy per plan. The risks, benefits, limitations, alternatives and imponderables have been reviewed with the patient. Questions have been answered. All parties are agreeable.   Further recommendations to follow.  Manus Rudd

## 2018-01-12 NOTE — Op Note (Addendum)
[facility] Patient Name: Danielle Cruz Procedure Date: 01/12/2018 12:47 PM MRN: [id number] Date of Birth: Feb 05, 1956 Attending MD: Norvel Richards , MD CSN: [id number] Age: 62 Admit Type: Outpatient Procedure:                Colonoscopy Indications:              Hematochezia Providers:                Norvel Richards, MD, Jeanann Lewandowsky. Sharon Seller, RN,                            Aram Candela Referring MD:              Medicines:                Propofol per Anesthesia Complications:            No immediate complications. Estimated Blood Loss:     Estimated blood loss: none. Procedure:                Pre-Anesthesia Assessment:                           - Prior to the procedure, a History and Physical                            was performed, and patient medications and                            allergies were reviewed. The patient's tolerance of                            previous anesthesia was also reviewed. The risks                            and benefits of the procedure and the sedation                            options and risks were discussed with the patient.                            All questions were answered, and informed consent                            was obtained. Prior Anticoagulants: The patient has                            taken no previous anticoagulant or antiplatelet                            agents. ASA Grade Assessment: II - A patient with                            mild systemic disease. After reviewing the risks  and benefits, the patient was deemed in                            satisfactory condition to undergo the procedure.                           After obtaining informed consent, the colonoscope                            was passed under direct vision. Throughout the                            procedure, the patient's blood pressure, pulse, and                            oxygen saturations were monitored  continuously. The                            [id number]-[id number] ([id number]) scope was introduced through                            the and advanced to the the cecum, identified by                            appendiceal orifice and ileocecal valve. The                            colonoscopy was performed without difficulty. The                            patient tolerated the procedure well. The quality                            of the bowel preparation was adequate. Scope In: 1:18:34 PM Scope Out: 1:34:54 PM Scope Withdrawal Time: 0 hours 8 minutes 45 seconds  Total Procedure Duration: 0 hours 16 minutes 20 seconds  Findings:      The perianal and digital rectal examinations were normal.      Many small and large-mouthed diverticula were found in the entire colon.      The perianal and digital rectal examinations were normal.      Internal hemorrhoids were found.      The exam was otherwise without abnormality on direct and retroflexion       views. Impression:               - Diverticulosis in the entire examined colon.                           - Internal hemorrhoids.                           - The examination was otherwise normal on direct                            and retroflexion views.                           -  No specimens collected. Moderate Sedation:      Moderate (conscious) sedation was personally administered by an       anesthesia professional. The following parameters were monitored: oxygen       saturation, heart rate, blood pressure, respiratory rate, EKG, adequacy       of pulmonary ventilation, and response to care. Total physician       intraservice time was 23 minutes. Recommendation:           - Patient has a contact number available for                            emergencies. The signs and symptoms of potential                            delayed complications were discussed with the                            patient. Return to normal activities tomorrow.                             Written discharge instructions were provided to the                            patient.                           - Resume previous diet.                           - Continue present medications.                           - Advance diet as tolerated.                           - Continue present medications.                           - Repeat colonoscopy for screening purposes. Add                            Benefiber 1 tablespoon daily x3 weeks and then                            increase to 2 tablespoons daily thereafter. If                            persistent bleeding, can consider hemorrhoid                            banding in the office. From a GI standpoint,                            patient can be discharged this afternoon. I  appreciate the hospitalist help with this nice                            lady. I have discussed my findings and                            recommendations with her husband in short stay. Procedure Code(s):        --- Professional ---                           [phone], Colonoscopy, flexible; diagnostic, including                            collection of specimen(s) by brushing or washing,                            when performed (separate procedure) Diagnosis Code(s):        --- Professional ---                           K64.8, Other hemorrhoids                           K92.1, Melena (includes Hematochezia)                           K57.30, Diverticulosis of large intestine without                            perforation or abscess without bleeding CPT copyright 2017 American Medical Association. All rights reserved. The codes documented in this report are preliminary and upon coder review may  be revised to meet current compliance requirements. Cristopher Estimable. , MD Norvel Richards, MD 01/12/2018 1:45:58 PM This report has been signed electronically. Number of Addenda: 0

## 2018-01-13 ENCOUNTER — Telehealth: Payer: Self-pay

## 2018-01-13 ENCOUNTER — Encounter: Payer: Self-pay | Admitting: Internal Medicine

## 2018-01-13 NOTE — Telephone Encounter (Signed)
Transition Care Management Follow-up Telephone Call   Date discharged? 9/26               How have you been since you were released from the hospital? She's been great   Do you understand why you were in the hospital? GI bleed   Do you understand the discharge instructions? yes   Where were you discharged to? home   Items Reviewed:  Medications reviewed: yes  Allergies reviewed: yes  Dietary changes reviewed: heart healthy  Referrals reviewed: GI doctor   Functional Questionnaire:   Activities of Daily Living (ADLs):  has help    Any transportation issues/concerns?: no   Any patient concerns? no   Confirmed importance and date/time of follow-up visits scheduled 9/30 at 1:40pm     Confirmed with patient if condition begins to worsen call PCP or go to the ER.  Patient was given the office number and encouraged to call back with question or concerns.  yes with verbal understanding

## 2018-01-16 ENCOUNTER — Ambulatory Visit: Payer: Medicare Other | Admitting: Family Medicine

## 2018-01-16 ENCOUNTER — Encounter (HOSPITAL_COMMUNITY): Payer: Self-pay | Admitting: Internal Medicine

## 2018-01-25 ENCOUNTER — Encounter: Payer: Self-pay | Admitting: Family Medicine

## 2018-01-25 ENCOUNTER — Ambulatory Visit (INDEPENDENT_AMBULATORY_CARE_PROVIDER_SITE_OTHER): Payer: Medicare Other | Admitting: Family Medicine

## 2018-01-25 VITALS — BP 138/70 | HR 77 | Resp 12 | Ht 67.0 in

## 2018-01-25 DIAGNOSIS — K5730 Diverticulosis of large intestine without perforation or abscess without bleeding: Secondary | ICD-10-CM | POA: Diagnosis not present

## 2018-01-25 DIAGNOSIS — E785 Hyperlipidemia, unspecified: Secondary | ICD-10-CM | POA: Diagnosis not present

## 2018-01-25 DIAGNOSIS — Z23 Encounter for immunization: Secondary | ICD-10-CM | POA: Diagnosis not present

## 2018-01-25 DIAGNOSIS — F17210 Nicotine dependence, cigarettes, uncomplicated: Secondary | ICD-10-CM | POA: Diagnosis not present

## 2018-01-25 DIAGNOSIS — Z9181 History of falling: Secondary | ICD-10-CM

## 2018-01-25 DIAGNOSIS — I10 Essential (primary) hypertension: Secondary | ICD-10-CM | POA: Diagnosis not present

## 2018-01-25 DIAGNOSIS — K649 Unspecified hemorrhoids: Secondary | ICD-10-CM | POA: Diagnosis not present

## 2018-01-25 DIAGNOSIS — Z803 Family history of malignant neoplasm of breast: Secondary | ICD-10-CM | POA: Diagnosis not present

## 2018-01-25 DIAGNOSIS — N179 Acute kidney failure, unspecified: Secondary | ICD-10-CM | POA: Diagnosis not present

## 2018-01-25 DIAGNOSIS — Z720 Tobacco use: Secondary | ICD-10-CM | POA: Diagnosis not present

## 2018-01-25 DIAGNOSIS — E559 Vitamin D deficiency, unspecified: Secondary | ICD-10-CM

## 2018-01-25 DIAGNOSIS — Z09 Encounter for follow-up examination after completed treatment for conditions other than malignant neoplasm: Secondary | ICD-10-CM | POA: Diagnosis not present

## 2018-01-25 NOTE — Patient Instructions (Addendum)
F/U in end January, call, if you need me before  Flu vaccine today   Fasting lipid, cmp and eGFR, TSh and vit D 1 week before next visit

## 2018-01-26 ENCOUNTER — Other Ambulatory Visit: Payer: Self-pay | Admitting: Family Medicine

## 2018-01-29 ENCOUNTER — Other Ambulatory Visit: Payer: Self-pay | Admitting: Family Medicine

## 2018-02-04 ENCOUNTER — Encounter: Payer: Self-pay | Admitting: Family Medicine

## 2018-02-04 DIAGNOSIS — K649 Unspecified hemorrhoids: Secondary | ICD-10-CM | POA: Insufficient documentation

## 2018-02-04 DIAGNOSIS — K5730 Diverticulosis of large intestine without perforation or abscess without bleeding: Secondary | ICD-10-CM | POA: Insufficient documentation

## 2018-02-04 NOTE — Progress Notes (Signed)
   Danielle Cruz     MRN: [id number]      DOB: 08/25/1955   HPI Danielle Cruz is here for follow up af recent hospitalization from 09/25 to 01/12/2018 for further evaluation of painless rectal bleeding noted in 11/2017. Her colonoscopy on 09/26 showed no active bleeding and she had diverticulosis and hemmorhoids Since being home she denies any recurrent rectal bleeding and is working on cutting back smoking, spouse  planms to have them both quit by 04/2018 During hospitalization she was noted to have AKI and needs a rept chem7 , and had some meds  Discontinued until re eval No concerns at this visit, doing well and feels good. Mammogram still outstanding d following  Hip fracture safety of doing standing film still questionable  ROS Denies recent fever or chills. Denies sinus pressure, nasal congestion, ear pain or sore throat. Denies chest congestion, productive cough or wheezing. Denies chest pains, palpitations and leg swelling Denies abdominal pain, nausea, vomiting,diarrhea or constipation.   Denies dysuria, frequency, hesitancy or incontinence.  Denies headaches, seizures, numbness, or tingling. Denies depression, anxiety or insomnia. Denies skin break down or rash.   PE  BP 138/70 (BP Location: Left Arm, Patient Position: Sitting, Cuff Size: Normal)   Pulse 77   Resp 12   Ht 5\' 7"  (1.702 m)   SpO2 93% Comment: room air  BMI 23.45 kg/m   Patient alert and oriented and in no cardiopulmonary distress.  HEENT: No facial asymmetry, EOMI,   oropharynx pink and moist.  Neck supple no JVD, no mass.  Chest: Clear to auscultation bilaterally.decreased air entry  CVS: S1, S2 no murmurs, no S3.Regular rate.  ABD: Soft non tender.   Ext: No edema  MS: decreased  ROM spine, shoulders, hips and knees.  Skin: Intact, no ulcerations or rash noted.  Psych: Good eye contact, normal affect. Memory intact not anxious or depressed appearing.  CNS: CN 2-12 intact, expressive aphasia, mild  hemiplegia  [facility] discharge follow-up Hospital course reviewed with patient and spouse. No concerns voiced and pt doing well since discharge Explained significance of diverticulosis and hemorrhoids and risk of recurrent bleed Needs rept chem 7 to eval AKI Blood pressure normal on current med regime , continue same  Tobacco abuse Asked: confirms currently smokes approx 7 cigarettes/ day Asssess: plans to quit 04/19/2018 Advise : needs to quit to reduce risk of recurrent CVA, MI and all types of cancer, has pos f/h breast ca at a young age  Essential hypertension Controlled, no change in medication   AKI (acute kidney injury) ([place]) rept labs needed to reassess  At high risk for falls Home safety reviewed briefly , will continue to hold on mammogram

## 2018-02-04 NOTE — Assessment & Plan Note (Signed)
rept labs needed to reassess

## 2018-02-04 NOTE — Assessment & Plan Note (Addendum)
Asked: confirms currently smokes approx 7 cigarettes/ day Asssess: plans to quit 04/19/2018 Advise : needs to quit to reduce risk of recurrent CVA, MI and all types of cancer, has pos f/h breast ca at a young age

## 2018-02-04 NOTE — Assessment & Plan Note (Signed)
Controlled, no change in medication  

## 2018-02-04 NOTE — Assessment & Plan Note (Signed)
Home safety reviewed briefly , will continue to hold on mammogram

## 2018-02-04 NOTE — Assessment & Plan Note (Signed)
Hospital course reviewed with patient and spouse. No concerns voiced and pt doing well since discharge Explained significance of diverticulosis and hemorrhoids and risk of recurrent bleed Needs rept chem 7 to eval AKI Blood pressure normal on current med regime , continue same

## 2018-02-09 ENCOUNTER — Ambulatory Visit (INDEPENDENT_AMBULATORY_CARE_PROVIDER_SITE_OTHER): Payer: Medicare Other

## 2018-02-09 VITALS — BP 174/77 | HR 80 | Resp 10 | Ht 67.0 in

## 2018-02-09 DIAGNOSIS — Z0000 Encounter for general adult medical examination without abnormal findings: Secondary | ICD-10-CM | POA: Diagnosis not present

## 2018-02-09 NOTE — Progress Notes (Signed)
Subjective:   Danielle Cruz is a 62 y.o. female who presents for Medicare Annual (Subsequent) preventive examination.  Review of Systems:  Cardiac Risk Factors include: dyslipidemia;smoking/ tobacco exposure;hypertension     Objective:     Vitals: BP (!) 174/77   Pulse 80   Resp 10   Ht 5\' 7"  (1.702 m)   SpO2 99%   BMI 23.45 kg/m   Body mass index is 23.45 kg/m.  Advanced Directives 01/11/2018 10/03/2017 09/22/2017 03/21/2017 11/28/2013  Does Patient Have a Medical Advance Directive? No Yes No No No  Type of Advance Directive - [place]  Does patient want to make changes to medical advance directive? - No - Patient declined - - -  Copy of [place] in Chart? - No - copy requested - - -  Would patient like information on creating a medical advance directive? Yes (Inpatient - patient requests chaplain consult to create a medical advance directive) No - Patient declined No - Patient declined No - Patient declined Yes - Educational materials given    Tobacco Social History   Tobacco Use  Smoking Status Current Every Day Smoker  . Packs/day: 0.50  . Years: 15.00  . Pack years: 7.50  . Types: Cigarettes  Smokeless Tobacco Never Used  Tobacco Comment   quit after hospitized for hip fracture      Ready to quit: No Counseling given: Yes Comment: quit after hospitized for hip fracture    Clinical Intake:  Pre-visit preparation completed: Yes  Pain : No/denies pain Pain Score: 0-No pain     BMI - recorded: 23.5 Nutritional Status: BMI of 19-24  Normal Nutritional Risks: None Diabetes: No  How often do you need to have someone help you when you read instructions, pamphlets, or other written materials from your doctor or pharmacy?: 1 - Never What is the last grade level you completed in school?: 11 grade   Interpreter Needed?: No  Comments: Patient accompined by husband  Information entered by :: Francena Hanly LPN  Past  Medical History:  Diagnosis Date  . AKI (acute kidney injury) ([place]) 09/22/2017  . Anemia   . 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 Arthritis   . Colonic polyp 2010   Hemorrhoids; h/o mild hematochezia  . CVA (cerebral infarction) 08/2008   sizable left -residual expressive aphasia, right sided weakness; ambulates with difficulty with the right leg brace   . Hyperlipidemia   . Hypertension 2005  . Rheumatoid arthritis(714.0)   . Stroke [facility])    Past Surgical History:  Procedure Laterality Date  . ANKLE SURGERY     Right  . CAROTID STENT INSERTION    . COLONOSCOPY W/ POLYPECTOMY  11/2008   Dr. Gala Romney. Left-sided diverticula, Pedunculated polyp snared (no adenomatous changes)  . COLONOSCOPY WITH PROPOFOL N/A 01/12/2018   Procedure: COLONOSCOPY WITH PROPOFOL;  Surgeon: Daneil Dolin, MD;  [facility];  Service: Endoscopy;  Laterality: N/A;  . FEMUR IM NAIL Right 09/23/2017   Procedure: INTRAMEDULLARY (IM) NAIL FEMORAL;  Surgeon: Renette Butters, MD;  Location: [place];  Service: Orthopedics;  Laterality: Right;  . FRACTURE SURGERY     Hip replacement in May 2019 - fall related   . OOPHORECTOMY     Family History  Problem Relation Age of Onset  . Cancer Father  prostate, deceased 5s  . Breast cancer Sister        Deceased 80s  . Heart attack Mother        deceased 33, during childbirth  . Cancer Brother        lymphoma  . Colon cancer Maternal Aunt        older than age 62  . Liver disease Neg Hx    Social History   Socioeconomic History  . Marital status: Married    Spouse name: Not on file  . Number of children: 3  . Years of education: 11th grade  . Highest education level: 11th grade  Occupational History  . Occupation: disabilty x 9years    Comment: secondary to arthritis   Social Needs  . Financial resource strain: Not very hard  . Food insecurity:    Worry: Never true     Inability: Never true  . Transportation needs:    Medical: No    Non-medical: No  Tobacco Use  . Smoking status: Current Every Day Smoker    Packs/day: 0.50    Years: 15.00    Pack years: 7.50    Types: Cigarettes  . Smokeless tobacco: Never Used  . Tobacco comment: quit after hospitized for hip fracture   Substance and Sexual Activity  . Alcohol use: No    Alcohol/week: 0.0 standard drinks    Comment: quit 6 years ago   . Drug use: No  . Sexual activity: Not on file  Lifestyle  . Physical activity:    Days per week: 0 days    Minutes per session: 0 min  . Stress: Not at all  Relationships  . Social connections:    Talks on phone: Once a week    Gets together: More than three times a week    Attends religious service: Never    Active member of club or organization: No    Attends meetings of clubs or organizations: Never    Relationship status: Married  Other Topics Concern  . Not on file  Social History Narrative   Pt gave birth to 4 children, 1 deceased at 20 weeks was a premature baby, 3 are living ages range 61 to 66 all healthy     Outpatient Encounter Medications as of 02/09/2018  Medication Sig  . aspirin EC 81 MG tablet Take 81 mg by mouth daily.  Marland Kitchen atorvastatin (LIPITOR) 80 MG tablet TAKE 1 TABLET(80 MG) BY MOUTH DAILY  . docusate sodium (COLACE) 100 MG capsule Take 1 capsule (100 mg total) by mouth 2 (two) times daily. (Patient taking differently: Take 100 mg by mouth every other day. )  . ferrous sulfate 325 (65 FE) MG tablet Take 1 tablet (325 mg total) by mouth 2 (two) times daily with a meal.  . fluticasone (FLONASE) 50 MCG/ACT nasal spray Place 1 spray into both nostrils daily.  . folic acid (FOLVITE) 1 MG tablet take one tablet daily  . HYDROcodone-acetaminophen (NORCO/VICODIN) 5-325 MG tablet Take 1 tablet by mouth daily as needed.  . leflunomide (ARAVA) 20 MG tablet Take 20 mg by mouth daily.    . metoprolol succinate (TOPROL-XL) 25 MG 24 hr tablet Take  1 tablet (25 mg total) by mouth daily.  . montelukast (SINGULAIR) 10 MG tablet TAKE 1 TABLET(10 MG) BY MOUTH AT BEDTIME  . Multiple Vitamin (MULTIVITAMIN WITH MINERALS) TABS tablet Take 1 tablet by mouth daily.  Marland Kitchen oxybutynin (DITROPAN) 5 MG tablet TAKE 1/2 TABLET BY MOUTH TWICE DAILY  .  pantoprazole (PROTONIX) 40 MG tablet Take 1 tablet (40 mg total) by mouth daily.  . potassium chloride SA (K-DUR,KLOR-CON) 20 MEQ tablet TAKE 1 TABLET BY MOUTH EVERY DAY (Patient taking differently: Take 20 mEq by mouth daily. )   No facility-administered encounter medications on file as of 02/09/2018.     Activities of Daily Living In your present state of health, do you have any difficulty performing the following activities: 02/09/2018 01/11/2018  Hearing? N N  Vision? N N  Difficulty concentrating or making decisions? N N  Walking or climbing stairs? Y Y  Dressing or bathing? Y Y  Doing errands, shopping? Y N  Preparing Food and eating ? Y -  Using the Toilet? Y -  In the past six months, have you accidently leaked urine? N -  Do you have problems with loss of bowel control? N -  Managing your Medications? Y -  Managing your Finances? Y -  Housekeeping or managing your Housekeeping? Y -  Some recent data might be hidden    Patient Care Team: Fayrene Helper, MD as PCP - Hardin Negus, MD (Internal Medicine) Gala Romney Cristopher Estimable, MD as Consulting Physician (Gastroenterology) Harl Bowie Alphonse Guild, MD as Consulting Physician (Cardiology)    Assessment:   This is a routine wellness examination for [place].  Exercise Activities and Dietary recommendations Current Exercise Habits: The patient does not participate in regular exercise at present, Exercise limited by: orthopedic condition(s)  Goals    . Increase physical activity    . LIFESTYLE - DECREASE FALLS RISK       Fall Risk Fall Risk  02/09/2018 01/25/2018 03/21/2017 12/02/2015 11/28/2013  Falls in the past year? Yes No No No No    Number falls in past yr: 1 - - - -  Injury with Fall? Yes - - - -  Risk for fall due to : History of fall(s);Impaired balance/gait;Impaired mobility - - - -  Follow up Falls prevention discussed - - - -   Is the patient's home free of loose throw rugs in walkways, pet beds, electrical cords, etc?   yes      Grab bars in the bathroom? yes      Handrails on the stairs?   yes      Adequate lighting?   yes  Timed Get Up and Go performed: Patient unable to perform   Depression Screen PHQ 2/9 Scores 02/09/2018 01/25/2018 10/25/2017 03/21/2017  PHQ - 2 Score 2 4 0 0  PHQ- 9 Score 2 8 - -     Cognitive Function     6CIT Screen 03/21/2017  What Year? 0 points  What month? 0 points  What time? 0 points  Count back from 20 0 points  Months in reverse 0 points  Repeat phrase 0 points  Total Score 0    Immunization History  Administered Date(s) Administered  . Influenza Whole 01/01/2010, 03/09/2010, 01/11/2011  . Influenza,inj,Quad PF,6+ Mos 02/09/2013, 05/09/2014, 05/07/2016, 01/31/2017, 01/25/2018  . Pneumococcal Conjugate-13 11/28/2013  . Tdap 01/11/2011    Qualifies for Shingles Vaccine? Up to date   Screening Tests Health Maintenance  Topic Date Due  . MAMMOGRAM  02/05/2017  . PAP SMEAR  05/05/2019  . TETANUS/TDAP  01/10/2021  . COLONOSCOPY  01/13/2028  . INFLUENZA VACCINE  Completed  . Hepatitis C Screening  Completed  . HIV Screening  Completed    Cancer Screenings: Lung: Low Dose CT Chest recommended if Age 67-80 years, 30 pack-year currently smoking  OR have quit w/in 15years. Patient does qualify. Breast:  Up to date on Mammogram? Yes   Up to date of Bone Density/Dexa? Yes Colorectal: Up to date   Additional Screenings:  Hepatitis C Screening: Complete      Plan:   Increase physical activity and begin walking again and quit Smoking   I have personally reviewed and noted the following in the patient's chart:   . Medical and social history . Use of alcohol,  tobacco or illicit drugs  . Current medications and supplements . Functional ability and status . Nutritional status . Physical activity . Advanced directives . List of other physicians . Hospitalizations, surgeries, and ER visits in previous 12 months . Vitals . Screenings to include cognitive, depression, and falls . Referrals and appointments  In addition, I have reviewed and discussed with patient certain preventive protocols, quality metrics, and best practice recommendations. A written personalized care plan for preventive services as well as general preventive health recommendations were provided to patient.     Francoise Schaumann, LPN  57/26/2035

## 2018-02-09 NOTE — Patient Instructions (Addendum)
Danielle Cruz , Thank you for taking time to come for your Medicare Wellness Visit. I appreciate your ongoing commitment to your health goals. Please review the following plan we discussed and let me know if I can assist you in the future.   Screening recommendations/referrals: Colonoscopy: up to date  Mammogram: postponed  Bone Density: complete Recommended yearly ophthalmology/optometry visit for glaucoma screening and checkup Recommended yearly dental visit for hygiene and checkup  Vaccinations: Influenza vaccine: up to date  Pneumococcal vaccine: postponed  Tdap vaccine: up to date  Shingles vaccine: postponed    Advanced directives: information given   Conditions/risks identified: currently in wheelchair, post right hip fracture, non-verbal   Next appointment: wellness in one year   Preventive Care 40-64 Years, Female Preventive care refers to lifestyle choices and visits with your health care provider that can promote health and wellness. What does preventive care include?  A yearly physical exam. This is also called an annual well check.  Dental exams once or twice a year.  Routine eye exams. Ask your health care provider how often you should have your eyes checked.  Personal lifestyle choices, including:  Daily care of your teeth and gums.  Regular physical activity.  Eating a healthy diet.  Avoiding tobacco and drug use.  Limiting alcohol use.  Practicing safe sex.  Taking low-dose aspirin daily starting at age 64.  Taking vitamin and mineral supplements as recommended by your health care provider. What happens during an annual well check? The services and screenings done by your health care provider during your annual well check will depend on your age, overall health, lifestyle risk factors, and family history of disease. Counseling  Your health care provider may ask you questions about your:  Alcohol use.  Tobacco use.  Drug use.  Emotional  well-being.  Home and relationship well-being.  Sexual activity.  Eating habits.  Work and work Statistician.  Method of birth control.  Menstrual cycle.  Pregnancy history. Screening  You may have the following tests or measurements:  Height, weight, and BMI.  Blood pressure.  Lipid and cholesterol levels. These may be checked every 5 years, or more frequently if you are over 85 years old.  Skin check.  Lung cancer screening. You may have this screening every year starting at age 73 if you have a 30-pack-year history of smoking and currently smoke or have quit within the past 15 years.  Fecal occult blood test (FOBT) of the stool. You may have this test every year starting at age 26.  Flexible sigmoidoscopy or colonoscopy. You may have a sigmoidoscopy every 5 years or a colonoscopy every 10 years starting at age 64.  Hepatitis C blood test.  Hepatitis B blood test.  Sexually transmitted disease (STD) testing.  Diabetes screening. This is done by checking your blood sugar (glucose) after you have not eaten for a while (fasting). You may have this done every 1-3 years.  Mammogram. This may be done every 1-2 years. Talk to your health care provider about when you should start having regular mammograms. This may depend on whether you have a family history of breast cancer.  BRCA-related cancer screening. This may be done if you have a family history of breast, ovarian, tubal, or peritoneal cancers.  Pelvic exam and Pap test. This may be done every 3 years starting at age 59. Starting at age 64, this may be done every 5 years if you have a Pap test in combination with an HPV  test.  Bone density scan. This is done to screen for osteoporosis. You may have this scan if you are at high risk for osteoporosis. Discuss your test results, treatment options, and if necessary, the need for more tests with your health care provider. Vaccines  Your health care provider may recommend  certain vaccines, such as:  Influenza vaccine. This is recommended every year.  Tetanus, diphtheria, and acellular pertussis (Tdap, Td) vaccine. You may need a Td booster every 10 years.  Zoster vaccine. You may need this after age 48.  Pneumococcal 13-valent conjugate (PCV13) vaccine. You may need this if you have certain conditions and were not previously vaccinated.  Pneumococcal polysaccharide (PPSV23) vaccine. You may need one or two doses if you smoke cigarettes or if you have certain conditions. Talk to your health care provider about which screenings and vaccines you need and how often you need them. This information is not intended to replace advice given to you by your health care provider. Make sure you discuss any questions you have with your health care provider. Document Released: 05/02/2015 Document Revised: 12/24/2015 Document Reviewed: 02/04/2015 Elsevier Interactive Patient Education  2017 [place] Prevention in the Home Falls can cause injuries. They can happen to people of all ages. There are many things you can do to make your home safe and to help prevent falls. What can I do on the outside of my home?  Regularly fix the edges of walkways and driveways and fix any cracks.  Remove anything that might make you trip as you walk through a door, such as a raised step or threshold.  Trim any bushes or trees on the path to your home.  Use bright outdoor lighting.  Clear any walking paths of anything that might make someone trip, such as rocks or tools.  Regularly check to see if handrails are loose or broken. Make sure that both sides of any steps have handrails.  Any raised decks and porches should have guardrails on the edges.  Have any leaves, snow, or ice cleared regularly.  Use sand or salt on walking paths during winter.  Clean up any spills in your garage right away. This includes oil or grease spills. What can I do in the bathroom?  Use  night lights.  Install grab bars by the toilet and in the tub and shower. Do not use towel bars as grab bars.  Use non-skid mats or decals in the tub or shower.  If you need to sit down in the shower, use a plastic, non-slip stool.  Keep the floor dry. Clean up any water that spills on the floor as soon as it happens.  Remove soap buildup in the tub or shower regularly.  Attach bath mats securely with double-sided non-slip rug tape.  Do not have throw rugs and other things on the floor that can make you trip. What can I do in the bedroom?  Use night lights.  Make sure that you have a light by your bed that is easy to reach.  Do not use any sheets or blankets that are too big for your bed. They should not hang down onto the floor.  Have a firm chair that has side arms. You can use this for support while you get dressed.  Do not have throw rugs and other things on the floor that can make you trip. What can I do in the kitchen?  Clean up any spills right away.  Avoid  walking on wet floors.  Keep items that you use a lot in easy-to-reach places.  If you need to reach something above you, use a strong step stool that has a grab bar.  Keep electrical cords out of the way.  Do not use floor polish or wax that makes floors slippery. If you must use wax, use non-skid floor wax.  Do not have throw rugs and other things on the floor that can make you trip. What can I do with my stairs?  Do not leave any items on the stairs.  Make sure that there are handrails on both sides of the stairs and use them. Fix handrails that are broken or loose. Make sure that handrails are as long as the stairways.  Check any carpeting to make sure that it is firmly attached to the stairs. Fix any carpet that is loose or worn.  Avoid having throw rugs at the top or bottom of the stairs. If you do have throw rugs, attach them to the floor with carpet tape.  Make sure that you have a light switch at  the top of the stairs and the bottom of the stairs. If you do not have them, ask someone to add them for you. What else can I do to help prevent falls?  Wear shoes that:  Do not have high heels.  Have rubber bottoms.  Are comfortable and fit you well.  Are closed at the toe. Do not wear sandals.  If you use a stepladder:  Make sure that it is fully opened. Do not climb a closed stepladder.  Make sure that both sides of the stepladder are locked into place.  Ask someone to hold it for you, if possible.  Clearly mark and make sure that you can see:  Any grab bars or handrails.  First and last steps.  Where the edge of each step is.  Use tools that help you move around (mobility aids) if they are needed. These include:  Canes.  Walkers.  Scooters.  Crutches.  Turn on the lights when you go into a dark area. Replace any light bulbs as soon as they burn out.  Set up your furniture so you have a clear path. Avoid moving your furniture around.  If any of your floors are uneven, fix them.  If there are any pets around you, be aware of where they are.  Review your medicines with your doctor. Some medicines can make you feel dizzy. This can increase your chance of falling. Ask your doctor what other things that you can do to help prevent falls. This information is not intended to replace advice given to you by your health care provider. Make sure you discuss any questions you have with your health care provider. Document Released: 01/30/2009 Document Revised: 09/11/2015 Document Reviewed: 05/10/2014 Elsevier Interactive Patient Education  2017 [employer].

## 2018-02-10 ENCOUNTER — Other Ambulatory Visit: Payer: Self-pay

## 2018-02-13 ENCOUNTER — Telehealth: Payer: Self-pay

## 2018-02-13 DIAGNOSIS — N179 Acute kidney failure, unspecified: Secondary | ICD-10-CM

## 2018-02-13 NOTE — Telephone Encounter (Signed)
Lab ordered. Left message notifying spouse.

## 2018-02-16 DIAGNOSIS — M810 Age-related osteoporosis without current pathological fracture: Secondary | ICD-10-CM | POA: Diagnosis not present

## 2018-02-16 DIAGNOSIS — Z79899 Other long term (current) drug therapy: Secondary | ICD-10-CM | POA: Diagnosis not present

## 2018-02-16 DIAGNOSIS — M25579 Pain in unspecified ankle and joints of unspecified foot: Secondary | ICD-10-CM | POA: Diagnosis not present

## 2018-02-16 DIAGNOSIS — M2550 Pain in unspecified joint: Secondary | ICD-10-CM | POA: Diagnosis not present

## 2018-02-16 DIAGNOSIS — Z79891 Long term (current) use of opiate analgesic: Secondary | ICD-10-CM | POA: Diagnosis not present

## 2018-02-16 DIAGNOSIS — Z13820 Encounter for screening for osteoporosis: Secondary | ICD-10-CM | POA: Diagnosis not present

## 2018-02-16 DIAGNOSIS — M0579 Rheumatoid arthritis with rheumatoid factor of multiple sites without organ or systems involvement: Secondary | ICD-10-CM | POA: Diagnosis not present

## 2018-02-16 DIAGNOSIS — N179 Acute kidney failure, unspecified: Secondary | ICD-10-CM | POA: Diagnosis not present

## 2018-02-16 DIAGNOSIS — G8929 Other chronic pain: Secondary | ICD-10-CM | POA: Diagnosis not present

## 2018-02-16 DIAGNOSIS — R748 Abnormal levels of other serum enzymes: Secondary | ICD-10-CM | POA: Diagnosis not present

## 2018-02-17 ENCOUNTER — Other Ambulatory Visit: Payer: Self-pay | Admitting: Family Medicine

## 2018-02-17 DIAGNOSIS — Z1231 Encounter for screening mammogram for malignant neoplasm of breast: Secondary | ICD-10-CM

## 2018-02-17 DIAGNOSIS — I10 Essential (primary) hypertension: Secondary | ICD-10-CM

## 2018-02-17 DIAGNOSIS — E785 Hyperlipidemia, unspecified: Secondary | ICD-10-CM

## 2018-03-03 ENCOUNTER — Ambulatory Visit (INDEPENDENT_AMBULATORY_CARE_PROVIDER_SITE_OTHER): Payer: Medicare Other | Admitting: Cardiology

## 2018-03-03 ENCOUNTER — Encounter: Payer: Self-pay | Admitting: Cardiology

## 2018-03-03 VITALS — BP 124/64 | HR 54 | Ht 67.0 in | Wt 153.0 lb

## 2018-03-03 DIAGNOSIS — R600 Localized edema: Secondary | ICD-10-CM

## 2018-03-03 DIAGNOSIS — Z79891 Long term (current) use of opiate analgesic: Secondary | ICD-10-CM | POA: Diagnosis not present

## 2018-03-03 DIAGNOSIS — E782 Mixed hyperlipidemia: Secondary | ICD-10-CM | POA: Diagnosis not present

## 2018-03-03 DIAGNOSIS — I2510 Atherosclerotic heart disease of native coronary artery without angina pectoris: Secondary | ICD-10-CM

## 2018-03-03 DIAGNOSIS — M0579 Rheumatoid arthritis with rheumatoid factor of multiple sites without organ or systems involvement: Secondary | ICD-10-CM | POA: Diagnosis not present

## 2018-03-03 DIAGNOSIS — I10 Essential (primary) hypertension: Secondary | ICD-10-CM

## 2018-03-03 MED ORDER — ROSUVASTATIN CALCIUM 20 MG PO TABS: 20.0000 mg | ORAL_TABLET | Freq: Every day | ORAL | 3 refills | Status: DC

## 2018-03-03 NOTE — Progress Notes (Signed)
Clinical Summary Danielle Cruz is a 62 y.o.female seen today for follow up of the following medical problems.   1. CAD - prior stenting in 2005 to LAD and OM - echo 08/2008 LVEF 55-65%, no WMAs  - no recent chest pain. No SOB/DOE - compliant with meds  2. Hx of CVA - aphasic,has beenon ASA and statin for secondary prevention  3. Hyperlipidemia 10/2017 TC 190 TG 384 HDL 23 LDL 112  4. HTN - compliatnw with meds  5. LE edema -improved with lasix  - 12/2017 admission, had AKI. Diuretics stopped. Reports most recent labs at [facility] a few weeks ago    6. History of rectal bleeding - admit 12/2017 - found to have hemorroids and diverticulsois, no active bleeding.  Past Medical History:  Diagnosis Date  . AKI (acute kidney injury) ([place]) 09/22/2017  . Anemia   . 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 Arthritis   . Colonic polyp 2010   Hemorrhoids; h/o mild hematochezia  . CVA (cerebral infarction) 08/2008   sizable left -residual expressive aphasia, right sided weakness; ambulates with difficulty with the right leg brace   . Hyperlipidemia   . Hypertension 2005  . Rheumatoid arthritis(714.0)   . Stroke [facility])      Allergies  Allergen Reactions  . Ace Inhibitors Cough    New daily cough since starting ACE     Current Outpatient Medications  Medication Sig Dispense Refill  . aspirin EC 81 MG tablet Take 81 mg by mouth daily.    Marland Kitchen atorvastatin (LIPITOR) 80 MG tablet TAKE 1 TABLET(80 MG) BY MOUTH DAILY 90 tablet 2  . docusate sodium (COLACE) 100 MG capsule Take 1 capsule (100 mg total) by mouth 2 (two) times daily. (Patient taking differently: Take 100 mg by mouth every other day. ) 10 capsule 0  . ferrous sulfate 325 (65 FE) MG tablet Take 1 tablet (325 mg total) by mouth 2 (two) times daily with a meal. 30 tablet 3  . fluticasone (FLONASE) 50 MCG/ACT nasal spray Place 1 spray  into both nostrils daily. 16 g 6  . folic acid (FOLVITE) 1 MG tablet take one tablet daily  3  . HYDROcodone-acetaminophen (NORCO/VICODIN) 5-325 MG tablet Take 1 tablet by mouth daily as needed.  0  . leflunomide (ARAVA) 20 MG tablet Take 20 mg by mouth daily.      . metoprolol succinate (TOPROL-XL) 25 MG 24 hr tablet Take 1 tablet (25 mg total) by mouth daily. 90 tablet 3  . montelukast (SINGULAIR) 10 MG tablet TAKE 1 TABLET(10 MG) BY MOUTH AT BEDTIME 30 tablet 4  . Multiple Vitamin (MULTIVITAMIN WITH MINERALS) TABS tablet Take 1 tablet by mouth daily.    Marland Kitchen oxybutynin (DITROPAN) 5 MG tablet TAKE 1/2 TABLET BY MOUTH TWICE DAILY 90 tablet 0  . pantoprazole (PROTONIX) 40 MG tablet Take 1 tablet (40 mg total) by mouth daily. 30 tablet 5  . potassium chloride SA (K-DUR,KLOR-CON) 20 MEQ tablet TAKE 1 TABLET BY MOUTH EVERY DAY (Patient taking differently: Take 20 mEq by mouth daily. ) 90 tablet 3   No current facility-administered medications for this visit.      Past Surgical History:  Procedure Laterality Date  . ANKLE SURGERY     Right  . CAROTID STENT INSERTION    . COLONOSCOPY W/ POLYPECTOMY  11/2008   Dr. Gala Romney. Left-sided diverticula,  Pedunculated polyp snared (no adenomatous changes)  . COLONOSCOPY WITH PROPOFOL N/A 01/12/2018   Procedure: COLONOSCOPY WITH PROPOFOL;  Surgeon: Daneil Dolin, MD;  [facility];  Service: Endoscopy;  Laterality: N/A;  . FEMUR IM NAIL Right 09/23/2017   Procedure: INTRAMEDULLARY (IM) NAIL FEMORAL;  Surgeon: Renette Butters, MD;  Location: [facility];  Service: Orthopedics;  Laterality: Right;  . FRACTURE SURGERY     Hip replacement in May 2019 - fall related   . OOPHORECTOMY       Allergies  Allergen Reactions  . Ace Inhibitors Cough    New daily cough since starting ACE      Family History  Problem Relation Age of Onset  . Cancer Father        prostate, deceased 67s  . Breast cancer Sister        Deceased 54s  . Heart attack Mother         deceased 73, during childbirth  . Cancer Brother        lymphoma  . Colon cancer Maternal Aunt        older than age 59  . Liver disease Neg Hx      Social History Ms. Basinski reports that she has been smoking cigarettes. She has a 7.50 pack-year smoking history. She has never used smokeless tobacco. Ms. Simon reports that she does not drink alcohol.   Review of Systems CONSTITUTIONAL: No weight loss, fever, chills, weakness or fatigue.  HEENT: Eyes: No visual loss, blurred vision, double vision or yellow sclerae.No hearing loss, sneezing, congestion, runny nose or sore throat.  SKIN: No rash or itching.  CARDIOVASCULAR: per hpi RESPIRATORY: No shortness of breath, cough or sputum.  GASTROINTESTINAL: No anorexia, nausea, vomiting or diarrhea. No abdominal pain or blood.  GENITOURINARY: No burning on urination, no polyuria NEUROLOGICAL: No headache, dizziness, syncope, paralysis, ataxia, numbness or tingling in the extremities. No change in bowel or bladder control.  MUSCULOSKELETAL: No muscle, back pain, joint pain or stiffness.  LYMPHATICS: No enlarged nodes. No history of splenectomy.  PSYCHIATRIC: No history of depression or anxiety.  ENDOCRINOLOGIC: No reports of sweating, cold or heat intolerance. No polyuria or polydipsia.  Marland Kitchen   Physical Examination Vitals:   03/03/18 1036  BP: 124/64  Pulse: (!) 54  SpO2: 91%   Vitals:   03/03/18 1036  Weight: 153 lb (69.4 kg)  Height: 5\' 7"  (1.702 m)    Gen: resting comfortably, no acute distress HEENT: no scleral icterus, pupils equal round and reactive, no palptable cervical adenopathy,  CV: RRR, no m/r/g, no jvd Resp: Clear to auscultation bilaterally GI: abdomen is soft, non-tender, non-distended, normal bowel sounds, no hepatosplenomegaly MSK: extremities are warm, no edema.  Skin: warm, no rash Psych: appropriate affect   Diagnostic Studies 07/2003 Cath RESULTS:  HEMODYNAMICS:  1. Left ventricular pressure  145/12.  2. Aortic pressure 150/82.  3. There was no aortic valve gradient.  LEFT VENTRICULOGRAM: Wall motion is normal. Ejection fraction estimated at  greater than or equal to 65%. There is no mitral regurgitation.  CORONARY ARTERIOGRAPHY (CO-DOMINANT):  1. Left main is normal.  2. Left anterior descending artery has a tubular 20% stenosis in the  proximal to mid vessel. The distal LAD has a tubular 80% stenosis. The  apical LAD has a tubular 80% stenosis. The LAD gives rise to a small  diagonal Annsley Akkerman.  3. The left circumflex was a large co-dominant vessel. It gives rise to a  large  first obtuse marginal Danielle Cruz. There is a 90% stenosis in the mid  portion of the first obtuse marginal Danielle Cruz. There is haziness  associated with this lesion, and it has the appearance of a positive  ruptured plaque. There is a normal sized second obtuse marginal Danielle Cruz,  which has a 50% stenosis proximally and a 60% stenosis distally. The  distal circumflex also gives rise to 2 small posterolateral branches.  4. The right coronary artery is a relatively small co-dominant vessel.  There is diffuse 60% stenosis in the proximal vessel. In the mid vessel  at the acute margin, there is a 20% stenosis and the distal vessel has a  20% stenosis. There is a small to normal sized acute marginal Danielle Cruz,  which has a 70% at its origin. The distal right coronary artery also  gives rise to a small posterior descending coronary artery and a small  posterolateral Danielle Cruz.  IMPRESSION:  1. Normal left ventricular systolic function.  2. Three-vessel coronary artery disease, as described. The culprit lesion  appears to be the 90% stenosis in the first obtuse marginal Danielle Cruz.  There is also significant disease in the distal LAD and moderate, but  nonobstructive, disease in the small right coronary artery.  PLAN: Percutaneous intervention of the obtuse marginal and the LAD, see  below.   PTCA PROCEDURAL NOTE: Following completion of diagnostic catheterization,  we proceeded with percutaneous coronary intervention. We utilized the  preexisting #6 French sheath in the right femoral artery. Heparin and  Integrilin were administered per protocol. We used a #6 [place] LS 3.5  guiding catheter. A BMW wire was advanced under fluoroscopic guidance into  the distal portion of the first obtuse marginal Danielle Cruz. We then performed  PTCA of the 90% stenosis in the obtuse marginal with a 2.5 x 12 mm Quantum  balloon inflated to 10 atmospheres. We then positioned a 2.5 x 13 mm CYPHER  drug-eluting stent across the area of diseased vessel and deployed the stent  at 15 atmospheres. We then went back with a 2.5 x 12 mm Quantum balloon  within the stent and inflated this balloon to 22 atmospheres. The final  angiographic images were obtained revealing patency of the obtuse marginal  Danielle Cruz with 0% residual stenosis and TIMI 3 flow.  We then turned our attention to the LAD. The BMW wire was advanced under  fluoroscopic guidance into the apical portion of the LAD. We then performed  PTCA of the 80% stenosis in the distal LAD with a 2.0 x 15 mm Quantum  balloon inflated to 12 atmospheres. We then positioned a 2.0 x 15 mm Mini  Vision bare metal stent across the area of segment of disease and deployed  the stent at 12 atmospheres. We then went back with a 2.25 x 12 mm Quantum  balloon and positioning this within the stent, we inflated this balloon to  14 atmospheres in the proximal and distal edges of the stent. Final  angiographic images are obtained revealing patency of the LAD at the stent  site and with 0% residual stenosis and TIMI 3 flow.  COMPLICATIONS: None.  RESULTS:  1. Successful PTCA with placement of a drug-eluting stent in the first  obtuse marginal Danielle Cruz. A 90% stenosis with haziness was reduced to 0%  residual with TIMI 3 flow.  2. Successful PTCA  with placement of a bare metal stent in the distal left  anterior descending artery. A tubular 80% stenosis was reduced to 0%  residual with  TIMI flow.  PLAN: Integrilin will be continued for 18 hours. It is recommended that  the patient will be treated with Plavix for a minimum of 6-9 months. She  also needs aggressive risk factor modification. Regarding the residual  disease in the apical LAD, at this point, would recommend continue medical  therapy. If she has recurrent chest pain, which is refractor to medical  therapy, percutaneous coronary intervention of the apical LAD could be  considered.  08/2008 Echo Study Conclusions  1. Left ventricle: The cavity size was normal. Systolic function was normal. The estimated ejection fraction was in the range of 55% to 65%. Wall motion was normal; there were no regional wall motion abnormalities. 2. Aortic valve: Trivial regurgitation. 3. Mitral valve: No evidence of vegetation. 4. Left atrium: No evidence of thrombus in the atrial cavity or appendage. 5. Atrial septum: Echo contrast study showed no right-to-left atrial level shunt, in the baseline state (Late bubbles felt to be nondiagnostic) There was an atrial septal aneurysm.  06/2015 echo Study Conclusions  - Left ventricle: The cavity size was normal. Wall thickness was increased in a pattern of mild LVH. Systolic function was normal. The estimated ejection fraction was in the range of 60% to 65%. Wall motion was normal; there were no regional wall motion abnormalities. Doppler parameters are consistent with abnormal left ventricular relaxation (grade 1 diastolic dysfunction). - Atrial septum: No defect or patent foramen ovale was identified.    Assessment and Plan  1. CAD - no symptoms, continue current meds   2. Hx of CVA - she will continue secondary prevention  3. Hyperlipidemia - not at goal, d/c atorva and start crestor 20mg  daily. Goal  LDL<70.   4. HTN At goal. Pending renal function would consider low dose ARB.   5. LE edema - off diuretics, presented 12/2017 with AKI. Has had some increased LE edema since. - she reports labs with Dr Kathlene November at [facility] just a few weeks ago, request results. May consider restarting low dose lasix pending results.     F/u 4 months   Arnoldo Lenis, M.D.

## 2018-03-03 NOTE — Patient Instructions (Signed)
Medication Instructions:  STOP Atorvstatin  START Crestor 20 mg at dinner If you need a refill on your cardiac medications before your next appointment, please call your pharmacy.   Lab work: None If you have labs (blood work) drawn today and your tests are completely normal, you will receive your results only by: Marland Kitchen MyChart Message (if you have MyChart) OR . A paper copy in the mail If you have any lab test that is abnormal or we need to change your treatment, we will call you to review the results.  Testing/Procedures: None  Follow-Up: At [facility], you and your health needs are our priority.  As part of our continuing mission to provide you with exceptional heart care, we have created designated Provider Care Teams.  These Care Teams include your primary Cardiologist (physician) and Advanced Practice Providers (APPs -  Physician Assistants and Nurse Practitioners) who all work together to provide you with the care you need, when you need it. You will need a follow up appointment in 4 months.  Please call our office 2 months in advance to schedule this appointment.  You may see Carlyle Dolly, MD or one of the following Advanced Practice Providers on your designated Care Team:   Bernerd Pho, PA-C [facility]) . Ermalinda Barrios, PA-C ([place])  Any Other Special Instructions Will Be Listed Below (If Applicable). None

## 2018-04-21 ENCOUNTER — Encounter: Payer: Self-pay | Admitting: *Deleted

## 2018-04-24 ENCOUNTER — Other Ambulatory Visit: Payer: Self-pay | Admitting: Cardiology

## 2018-04-25 ENCOUNTER — Ambulatory Visit: Payer: Medicare Other | Admitting: Nurse Practitioner

## 2018-04-25 NOTE — Progress Notes (Deleted)
Referring Provider: Fayrene Helper, MD Primary Care Physician:  Fayrene Helper, MD Primary GI:  Dr. Gala Romney  No chief complaint on file.   HPI:   Danielle Cruz is a 63 y.o. female who presents for follow-up on rectal bleeding.  The patient was last seen in our office 12/06/2017 for hematochezia.  Hospitalized in [place] 2019 for hip fracture and while on Lovenox he had several episodes of painless rectal bleeding.  Symptoms resolved after coming off Lovenox.  She remains anemic but not iron deficient.  No other GI symptoms.  Limited mobility due to history of stroke and recent fracture.  Previous colonoscopy in 2009 with a benign polyp.  Recommended colonoscopy on propofol with prep overnight in the hospital on observation per staff facilitation of prep given limited mobility.  Colonoscopy was completed 01/12/2018 which found diverticulosis in the entire examined colon, internal hemorrhoids, otherwise normal.  Recommended Benefiber 1 tablespoon daily for 3 weeks and then increase to 2 tablespoons daily thereafter.  If persistent bleeding consider hemorrhoid banding in the office.  Today she states   Past Medical History:  Diagnosis Date  . AKI (acute kidney injury) ([place]) 09/22/2017  . Anemia   . 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 Arthritis   . Colonic polyp 2010   Hemorrhoids; h/o mild hematochezia  . CVA (cerebral infarction) 08/2008   sizable left -residual expressive aphasia, right sided weakness; ambulates with difficulty with the right leg brace   . Hyperlipidemia   . Hypertension 2005  . Rheumatoid arthritis(714.0)   . Stroke [facility])     Past Surgical History:  Procedure Laterality Date  . ANKLE SURGERY     Right  . CAROTID STENT INSERTION    . COLONOSCOPY W/ POLYPECTOMY  11/2008   Dr. Gala Romney. Left-sided diverticula, Pedunculated polyp snared (no adenomatous changes)  . COLONOSCOPY WITH PROPOFOL  N/A 01/12/2018   Procedure: COLONOSCOPY WITH PROPOFOL;  Surgeon: Daneil Dolin, MD;  [facility];  Service: Endoscopy;  Laterality: N/A;  . FEMUR IM NAIL Right 09/23/2017   Procedure: INTRAMEDULLARY (IM) NAIL FEMORAL;  Surgeon: Renette Butters, MD;  Location: [place];  Service: Orthopedics;  Laterality: Right;  . FRACTURE SURGERY     Hip replacement in May 2019 - fall related   . OOPHORECTOMY      Current Outpatient Medications  Medication Sig Dispense Refill  . aspirin EC 81 MG tablet Take 81 mg by mouth daily.    Marland Kitchen docusate sodium (COLACE) 100 MG capsule Take 1 capsule (100 mg total) by mouth 2 (two) times daily. (Patient taking differently: Take 100 mg by mouth every other day. ) 10 capsule 0  . ferrous sulfate 325 (65 FE) MG tablet Take 1 tablet (325 mg total) by mouth 2 (two) times daily with a meal. 30 tablet 3  . fluticasone (FLONASE) 50 MCG/ACT nasal spray Place 1 spray into both nostrils daily. 16 g 6  . folic acid (FOLVITE) 1 MG tablet take one tablet daily  3  . HYDROcodone-acetaminophen (NORCO/VICODIN) 5-325 MG tablet Take 1 tablet by mouth daily as needed.  0  . leflunomide (ARAVA) 20 MG tablet Take 20 mg by mouth daily.      . metoprolol succinate (TOPROL-XL) 25 MG 24 hr tablet Take 1 tablet (25 mg total) by mouth daily. 90 tablet 3  . montelukast (SINGULAIR) 10 MG tablet TAKE  1 TABLET(10 MG) BY MOUTH AT BEDTIME 30 tablet 4  . Multiple Vitamin (MULTIVITAMIN WITH MINERALS) TABS tablet Take 1 tablet by mouth daily.    Marland Kitchen oxybutynin (DITROPAN) 5 MG tablet TAKE 1/2 TABLET BY MOUTH TWICE DAILY 90 tablet 0  . pantoprazole (PROTONIX) 40 MG tablet Take 1 tablet (40 mg total) by mouth daily. 30 tablet 5  . potassium chloride SA (K-DUR,KLOR-CON) 20 MEQ tablet TAKE 1 TABLET BY MOUTH EVERY DAY (Patient taking differently: Take 20 mEq by mouth daily. ) 90 tablet 3  . rosuvastatin (CRESTOR) 20 MG tablet Take 1 tablet (20 mg total) by mouth daily. 90 tablet 3   No current  facility-administered medications for this visit.     Allergies as of 04/25/2018 - Review Complete 03/03/2018  Allergen Reaction Noted  . Ace inhibitors Cough 11/18/2014    Family History  Problem Relation Age of Onset  . Cancer Father        prostate, deceased 9s  . Breast cancer Sister        Deceased 42s  . Heart attack Mother        deceased 47, during childbirth  . Cancer Brother        lymphoma  . Colon cancer Maternal Aunt        older than age 31  . Liver disease Neg Hx     Social History   Socioeconomic History  . Marital status: Married    Spouse name: Not on file  . Number of children: 3  . Years of education: 11th grade  . Highest education level: 11th grade  Occupational History  . Occupation: disabilty x 9years    Comment: secondary to arthritis   Social Needs  . Financial resource strain: Not very hard  . Food insecurity:    Worry: Never true    Inability: Never true  . Transportation needs:    Medical: No    Non-medical: No  Tobacco Use  . Smoking status: Current Every Day Smoker    Packs/day: 0.50    Years: 15.00    Pack years: 7.50    Types: Cigarettes  . Smokeless tobacco: Never Used  . Tobacco comment: quit after hospitized for hip fracture   Substance and Sexual Activity  . Alcohol use: No    Alcohol/week: 0.0 standard drinks    Comment: quit 6 years ago   . Drug use: No  . Sexual activity: Not on file  Lifestyle  . Physical activity:    Days per week: 0 days    Minutes per session: 0 min  . Stress: Not at all  Relationships  . Social connections:    Talks on phone: Once a week    Gets together: More than three times a week    Attends religious service: Never    Active member of club or organization: No    Attends meetings of clubs or organizations: Never    Relationship status: Married  Other Topics Concern  . Not on file  Social History Narrative   Pt gave birth to 4 children, 1 deceased at 43 weeks was a premature baby, 3  are living ages range 18 to 66 all healthy     Review of Systems: General: Negative for anorexia, weight loss, fever, chills, fatigue, weakness. Eyes: Negative for vision changes.  ENT: Negative for hoarseness, difficulty swallowing , nasal congestion. CV: Negative for chest pain, angina, palpitations, dyspnea on exertion, peripheral edema.  Respiratory: Negative for dyspnea at rest,  dyspnea on exertion, cough, sputum, wheezing.  GI: See history of present illness. GU:  Negative for dysuria, hematuria, urinary incontinence, urinary frequency, nocturnal urination.  MS: Negative for joint pain, low back pain.  Derm: Negative for rash or itching.  Neuro: Negative for weakness, abnormal sensation, seizure, frequent headaches, memory loss, confusion.  Psych: Negative for anxiety, depression, suicidal ideation, hallucinations.  Endo: Negative for unusual weight change.  Heme: Negative for bruising or bleeding. Allergy: Negative for rash or hives.   Physical Exam: There were no vitals taken for this visit. General:   Alert and oriented. Pleasant and cooperative. Well-nourished and well-developed.  Head:  Normocephalic and atraumatic. Eyes:  Without icterus, sclera clear and conjunctiva pink.  Ears:  Normal auditory acuity. Mouth:  No deformity or lesions, oral mucosa pink.  Throat/Neck:  Supple, without mass or thyromegaly. Cardiovascular:  S1, S2 present without murmurs appreciated. Normal pulses noted. Extremities without clubbing or edema. Respiratory:  Clear to auscultation bilaterally. No wheezes, rales, or rhonchi. No distress.  Gastrointestinal:  +BS, soft, non-tender and non-distended. No HSM noted. No guarding or rebound. No masses appreciated.  Rectal:  Deferred  Musculoskalatal:  Symmetrical without gross deformities. Normal posture. Skin:  Intact without significant lesions or rashes. Neurologic:  Alert and oriented x4;  grossly normal neurologically. Psych:  Alert and  cooperative. Normal mood and affect. Heme/Lymph/Immune: No significant cervical adenopathy. No excessive bruising noted.    04/25/2018 7:47 AM   Disclaimer: This note was dictated with voice recognition software. Similar sounding words can inadvertently be transcribed and may not be corrected upon review.

## 2018-04-26 ENCOUNTER — Other Ambulatory Visit: Payer: Self-pay | Admitting: Family Medicine

## 2018-04-27 MED ORDER — FUROSEMIDE 20 MG PO TABS: ORAL_TABLET | ORAL | 3 refills | Status: DC

## 2018-04-27 NOTE — Telephone Encounter (Signed)
After reviewing labs- Dr. Harl Bowie stated pt can take 20 mg of lasix daily as needed for swelling.

## 2018-05-16 ENCOUNTER — Telehealth: Payer: Self-pay | Admitting: Family Medicine

## 2018-05-16 NOTE — Telephone Encounter (Signed)
Danielle Cruz, LVM for [place] to call regarding [name]... Called back LVM to return the call

## 2018-05-17 ENCOUNTER — Ambulatory Visit: Payer: Medicare Other | Admitting: Family Medicine

## 2018-05-19 DIAGNOSIS — Z79891 Long term (current) use of opiate analgesic: Secondary | ICD-10-CM | POA: Diagnosis not present

## 2018-05-19 DIAGNOSIS — G8929 Other chronic pain: Secondary | ICD-10-CM | POA: Diagnosis not present

## 2018-05-19 DIAGNOSIS — M810 Age-related osteoporosis without current pathological fracture: Secondary | ICD-10-CM | POA: Diagnosis not present

## 2018-05-19 DIAGNOSIS — M2550 Pain in unspecified joint: Secondary | ICD-10-CM | POA: Diagnosis not present

## 2018-05-19 DIAGNOSIS — R748 Abnormal levels of other serum enzymes: Secondary | ICD-10-CM | POA: Diagnosis not present

## 2018-05-19 DIAGNOSIS — M0579 Rheumatoid arthritis with rheumatoid factor of multiple sites without organ or systems involvement: Secondary | ICD-10-CM | POA: Diagnosis not present

## 2018-05-19 DIAGNOSIS — Z79899 Other long term (current) drug therapy: Secondary | ICD-10-CM | POA: Diagnosis not present

## 2018-05-19 DIAGNOSIS — M25579 Pain in unspecified ankle and joints of unspecified foot: Secondary | ICD-10-CM | POA: Diagnosis not present

## 2018-05-24 ENCOUNTER — Ambulatory Visit (INDEPENDENT_AMBULATORY_CARE_PROVIDER_SITE_OTHER): Payer: Medicare Other | Admitting: Family Medicine

## 2018-05-24 ENCOUNTER — Encounter: Payer: Self-pay | Admitting: Family Medicine

## 2018-05-24 VITALS — BP 120/82 | HR 85 | Resp 14 | Ht 67.0 in | Wt 143.1 lb

## 2018-05-24 DIAGNOSIS — Z720 Tobacco use: Secondary | ICD-10-CM | POA: Diagnosis not present

## 2018-05-24 DIAGNOSIS — I69051 Hemiplegia and hemiparesis following nontraumatic subarachnoid hemorrhage affecting right dominant side: Secondary | ICD-10-CM | POA: Diagnosis not present

## 2018-05-24 DIAGNOSIS — I10 Essential (primary) hypertension: Secondary | ICD-10-CM

## 2018-05-24 DIAGNOSIS — Z803 Family history of malignant neoplasm of breast: Secondary | ICD-10-CM | POA: Diagnosis not present

## 2018-05-24 DIAGNOSIS — Z9181 History of falling: Secondary | ICD-10-CM

## 2018-05-24 DIAGNOSIS — F17210 Nicotine dependence, cigarettes, uncomplicated: Secondary | ICD-10-CM

## 2018-05-24 NOTE — Assessment & Plan Note (Signed)
Controlled, no change in medication DASH diet and commitment to daily physical activity for a minimum of 30 minutes discussed and encouraged, as a part of hypertension management. The importance of attaining a healthy weight is also discussed.  BP/Weight 05/24/2018 03/03/2018 02/09/2018 01/25/2018 01/12/2018 1/59/4585 12/20/9242  Systolic BP 628 638 177 116 579 038 333  Diastolic BP 82 64 77 70 81 78 80  Wt. (Lbs) 143.08 153 - - - - -  BMI 22.41 23.96 23.45 23.45 - 23.45 -

## 2018-05-24 NOTE — Patient Instructions (Signed)
F/U ion 4 months, call if you need me before   Start limiting cigarettes to 7 / day for the next 2 weeks, then 5/ day for the next 2 weeks, then 3 / day for 2 weeks , then QUIT Call [id number] for help with quitting  We will send for recent labs  Blood pressure is good  Careful not to fall

## 2018-05-24 NOTE — Assessment & Plan Note (Signed)
Right hemi[paresis, fracture in 2019, home safety and use of assistive device discussed and stressed

## 2018-05-24 NOTE — Assessment & Plan Note (Signed)
Asked:confirms currently smokes  8 cigarettes/ day Assess:willing to quit and  cutting back Advise: needs to QUIT to reduce risk of cancer, cardio and cerebrovascular disease, already has CAd and CVD, strong f/h of breast cancer  Assist: counseled for 5 minutes and literature provided Arrange: follow up in 3 months

## 2018-05-24 NOTE — Assessment & Plan Note (Signed)
Needs imaging, incapable of standing and being safely positioned for mammogram will request [place] of breast

## 2018-05-24 NOTE — Progress Notes (Signed)
   Pat C Flaim     MRN: [id number]      DOB: [birth year]   HPI Ms. Freund is here for follow up and re-evaluation of chronic medical conditions, medication management and review of any available recent lab and radiology data.  Preventive health is updated, specifically  Cancer screening, needs to see if she can have [place] of breast as unable to get mammogram due to physical debility s/p CVa with right hemiparesis.   Questions or concerns regarding consultations or procedures which the PT has had in the interim are  addressed. The PT denies any adverse reactions to current medications since the last visit.  There are no new concerns.  There are no specific complaints   ROS Denies recent fever or chills. Denies sinus pressure, nasal congestion, ear pain or sore throat. Denies chest congestion, productive cough or wheezing. Denies chest pains, palpitations and leg swelling Denies abdominal pain, nausea, vomiting,diarrhea or constipation.   Denies dysuria, frequency, hesitancy or incontinence.  Denies headaches, seizures, numbness, or tingling. Denies depression, anxiety or insomnia. Denies skin break down or rash.   PE  BP 120/82   Pulse 85   Resp 14   Ht 5\' 7"  (1.702 m)   Wt 143 lb 1.3 oz (64.9 kg)   SpO2 98% Comment: room air  BMI 22.41 kg/m   Patient alert and oriented and in no cardiopulmonary distress.  HEENT: No facial asymmetry, EOMI,   oropharynx pink and moist.  Neck supple no JVD, no mass.  Chest: Clear to auscultation bilaterally.  CVS: S1, S2 no murmurs, no S3.Regular rate.  ABD: Soft non tender.   Ext: No edema  MS: decreased  ROM spine, right , hips and knes.  Skin: Intact, no ulcerations or rash noted.  Psych: Good eye contact, normal affect. Not anxious or depressed appearing CNS: CN 2-12 intact, right hemiparesis   Assessment & Plan  Essential hypertension Controlled, no change in medication DASH diet and commitment to daily physical activity for a  minimum of 30 minutes discussed and encouraged, as a part of hypertension management. The importance of attaining a healthy weight is also discussed.  BP/Weight 05/24/2018 03/03/2018 02/09/2018 01/25/2018 01/12/2018 06/30/9700 09/19/7856  Systolic BP 850 277 412 [phone] 947  Diastolic BP 82 64 77 70 81 78 80  Wt. 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

## 2018-05-24 NOTE — Assessment & Plan Note (Signed)
Stable, however limits safe mobility and she is incapable  Of IADL

## 2018-05-29 ENCOUNTER — Telehealth: Payer: Self-pay

## 2018-05-29 ENCOUNTER — Telehealth: Payer: Self-pay | Admitting: Family Medicine

## 2018-05-29 DIAGNOSIS — D649 Anemia, unspecified: Secondary | ICD-10-CM

## 2018-05-29 DIAGNOSIS — E785 Hyperlipidemia, unspecified: Secondary | ICD-10-CM

## 2018-05-29 NOTE — Telephone Encounter (Signed)
Pls call and let her know I did review recent labs, she needs additional ;lab draw for me for fasting lipid, iron and ferritin as soon as possible, please order Pls let her know still anemic Please let her know kidney function and potassium are normal Her albumin is slightly low, needs to increase protein intake, maybe 1 protein shake daily thanks

## 2018-05-29 NOTE — Telephone Encounter (Signed)
Advised spouse of lab results. Advised him patient will need additional lab draws with verbal understanding.

## 2018-05-29 NOTE — Telephone Encounter (Signed)
Advised patients spouse of recent lab results with verbal understanding. Advised that patient will need additional lab draw for lipids, iron, and ferritin with verbal understanding.

## 2018-06-06 ENCOUNTER — Encounter: Payer: Self-pay | Admitting: Family Medicine

## 2018-06-06 ENCOUNTER — Ambulatory Visit (INDEPENDENT_AMBULATORY_CARE_PROVIDER_SITE_OTHER): Payer: Medicare Other | Admitting: Family Medicine

## 2018-06-06 VITALS — BP 140/78 | HR 69 | Resp 10

## 2018-06-06 DIAGNOSIS — D649 Anemia, unspecified: Secondary | ICD-10-CM

## 2018-06-06 DIAGNOSIS — R58 Hemorrhage, not elsewhere classified: Secondary | ICD-10-CM

## 2018-06-06 DIAGNOSIS — E785 Hyperlipidemia, unspecified: Secondary | ICD-10-CM | POA: Diagnosis not present

## 2018-06-06 NOTE — Patient Instructions (Signed)
    Thank you for coming into the office today. I appreciate the opportunity to provide you with the care for your health and wellness.  Please go next door and get lab so we check her blood levels.  Please call the Dr who prescribed the medication and make them aware of blood loss event.  Continue to take all medications as prescribed until directed otherwise.  It was a pleasure to see you and I look forward to continuing to work together on your health and well-being. Please do not hesitate to call the office if you need care or have questions about your care.  Have a wonderful day and week.  With Gratitude,  Cherly Beach, DNP, AGNP-BC

## 2018-06-06 NOTE — Progress Notes (Signed)
Acute Office Visit  Subjective:    Patient ID: Danielle Cruz, female    DOB: 08-23-1955, 63 y.o.   MRN: [id number]  Chief Complaint  Patient presents with  . passed a large amount of bright red blood 1X saturday night    HPI Danielle Cruz is a 63 year old female, who comes into the office today after passing a large amount of bright red blood from rectum x1 on Saturday night.  Of note, Thursday, she was started on new medication, methotrexate.  Family is concerned as the side effects include having GI bleeding.  Denies any pain, pressure in the abdomen or pelvis area, any repeat episodes of blood loss over the weekend or yesterday prior to coming into the office.  Blood is described as not being cloudy but liquidy.  Does have history of GI bleed, diverticulosis of the colon, colon polyps.  Does have history of having hemorrhoids.  Does have history of having anemia, normocytic anemia.  She is currently on iron 325 mg tablet. She is currently on aspirin 81 mg daily.  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  Danielle Cruz is taking care of by her husband, and family members.  Family reports that she has not had any GI bleeding in several years as far as they know of.  Is on Protonix, for acid buildup.  Has not complained of having any heartburn or GERD symptoms.  Denies having shortness of breath, chest pain, or fluttering sensations of heart rate.  Family would like to note, that she was also started on Fosamax about a month ago.  They do not believe that this is the cause of the recent blood loss through the stool.  They are more focused on the methotrexate at this time.  They report they have not called the provider who performed who prescribed this medication.  She was prescribed this medication, for rheumatoid arthritis.   Past Medical History:  Diagnosis Date  . AKI (acute kidney injury) ([place]) 09/22/2017  . Anemia   . 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 Arthritis   . Colonic polyp 2010   Hemorrhoids; h/o mild hematochezia  . CVA (cerebral infarction) 08/2008   sizable left -residual expressive aphasia, right sided weakness; ambulates with difficulty with the right leg brace   . Hyperlipidemia   . Hypertension 2005  . Rheumatoid arthritis(714.0)   . Stroke [facility])     Past Surgical History:  Procedure Laterality Date  . ANKLE SURGERY     Right  . CAROTID STENT INSERTION    . COLONOSCOPY W/ POLYPECTOMY  11/2008   Dr. Gala Romney. Left-sided diverticula, Pedunculated polyp snared (no adenomatous changes)  . COLONOSCOPY WITH PROPOFOL N/A 01/12/2018   Procedure: COLONOSCOPY WITH PROPOFOL;  Surgeon: Daneil Dolin, MD;  [facility];  Service: Endoscopy;  Laterality: N/A;  . FEMUR IM NAIL Right 09/23/2017   Procedure: INTRAMEDULLARY (IM) NAIL FEMORAL;  Surgeon: Renette Butters, MD;  Location: [place];  Service: Orthopedics;  Laterality: Right;  . FRACTURE SURGERY     Hip replacement in May 2019 - fall related   . OOPHORECTOMY      Family History  Problem Relation Age of Onset  . Cancer Father        prostate, deceased 98s  . Breast cancer Sister        Deceased 39s  . Heart  attack Mother        deceased 52, during childbirth  . Cancer Brother        lymphoma  . Colon cancer Maternal Aunt        older than age 50  . Liver disease Neg Hx     Social History   Socioeconomic History  . Marital status: Married    Spouse name: Not on file  . Number of children: 3  . Years of education: 11th grade  . Highest education level: 11th grade  Occupational History  . Occupation: disabilty x 9years    Comment: secondary to arthritis   Social Needs  . Financial resource strain: Not very hard  . Food insecurity:    Worry: Never true    Inability: Never true  . Transportation needs:    Medical: No    Non-medical: No  Tobacco Use  . Smoking status: Current Every Day Smoker     Packs/day: 0.50    Years: 15.00    Pack years: 7.50    Types: Cigarettes  . Smokeless tobacco: Never Used  . Tobacco comment: quit after hospitized for hip fracture   Substance and Sexual Activity  . Alcohol use: No    Alcohol/week: 0.0 standard drinks    Comment: quit 6 years ago   . Drug use: No  . Sexual activity: Not on file  Lifestyle  . Physical activity:    Days per week: 0 days    Minutes per session: 0 min  . Stress: Not at all  Relationships  . Social connections:    Talks on phone: Once a week    Gets together: More than three times a week    Attends religious service: Never    Active member of club or organization: No    Attends meetings of clubs or organizations: Never    Relationship status: Married  . Intimate partner violence:    Fear of current or ex partner: No    Emotionally abused: No    Physically abused: No    Forced sexual activity: No  Other Topics Concern  . Not on file  Social History Narrative   Pt gave birth to 4 children, 1 deceased at 46 weeks was a premature baby, 3 are living ages range 1 to 47 all healthy     Outpatient Medications Prior to Visit  Medication Sig Dispense Refill  . alendronate (FOSAMAX) 70 MG tablet Take 75 mg by mouth once a week.    Marland Kitchen aspirin EC 81 MG tablet Take 81 mg by mouth daily.    Marland Kitchen docusate sodium (COLACE) 100 MG capsule Take 1 capsule (100 mg total) by mouth 2 (two) times daily. (Patient taking differently: Take 100 mg by mouth every other day. ) 10 capsule 0  . ferrous sulfate 325 (65 FE) MG tablet Take 1 tablet (325 mg total) by mouth 2 (two) times daily with a meal. 30 tablet 3  . fluticasone (FLONASE) 50 MCG/ACT nasal spray Place 1 spray into both nostrils daily. 16 g 6  . folic acid (FOLVITE) 1 MG tablet take one tablet daily  3  . furosemide (LASIX) 20 MG tablet Take 20 mg daily as needed for swelling. 90 tablet 3  . HYDROcodone-acetaminophen (NORCO/VICODIN) 5-325 MG tablet Take 1 tablet by mouth daily as  needed.  0  . leflunomide (ARAVA) 20 MG tablet Take 20 mg by mouth daily.      . methotrexate (RHEUMATREX) 2.5 MG tablet Take by  mouth once a week. Spouse unsure of dose    . metoprolol succinate (TOPROL-XL) 25 MG 24 hr tablet Take 1 tablet (25 mg total) by mouth daily. 90 tablet 3  . montelukast (SINGULAIR) 10 MG tablet TAKE 1 TABLET(10 MG) BY MOUTH AT BEDTIME 30 tablet 4  . Multiple Vitamin (MULTIVITAMIN WITH MINERALS) TABS tablet Take 1 tablet by mouth daily.    Marland Kitchen oxybutynin (DITROPAN) 5 MG tablet TAKE 1/2 TABLET BY MOUTH TWICE DAILY 90 tablet 0  . pantoprazole (PROTONIX) 40 MG tablet Take 1 tablet (40 mg total) by mouth daily. 30 tablet 5  . potassium chloride SA (K-DUR,KLOR-CON) 20 MEQ tablet TAKE 1 TABLET BY MOUTH EVERY DAY (Patient taking differently: Take 20 mEq by mouth daily. ) 90 tablet 3  . rosuvastatin (CRESTOR) 20 MG tablet Take 1 tablet (20 mg total) by mouth daily. 90 tablet 3   No facility-administered medications prior to visit.     Allergies  Allergen Reactions  . Ace Inhibitors Cough    New daily cough since starting ACE    Review of Systems  Constitutional: Negative for chills, fever and malaise/fatigue.  Respiratory: Negative for cough and shortness of breath.   Cardiovascular: Negative for chest pain, palpitations and leg swelling.  Gastrointestinal: Positive for blood in stool. Negative for abdominal pain, constipation and diarrhea.  Genitourinary: Negative.   Musculoskeletal: Negative.   Neurological: Negative for dizziness and headaches.  All other systems reviewed and are negative.      Objective:    Physical Exam  Constitutional: She appears well-developed and well-nourished.  HENT:  Head: Normocephalic and atraumatic.  Right Ear: External ear normal.  Left Ear: External ear normal.  Nose: Nose normal.  Eyes: Conjunctivae are normal.  Cardiovascular: Normal rate, regular rhythm and normal heart sounds.  Pulmonary/Chest: Effort normal and breath  sounds normal.  Musculoskeletal:     Comments: Patient is hemiplegic, late effect of stroke.  Neurological: She is alert.  Skin: Skin is warm and dry.  Psychiatric: She is withdrawn. She is noncommunicative.  She is not happy to be here in the office today.  Did not want to come in, even though she had bleeding in her stool.  Does not answer questions.  Refers to family to answer for her.  Nursing note and vitals reviewed.   BP 140/78 (BP Location: Left Arm, Patient Position: Sitting, Cuff Size: Normal)   Pulse 69   Resp 10   SpO2 98%  Wt Readings from Last 3 Encounters:  05/24/18 143 lb 1.3 oz (64.9 kg)  03/03/18 153 lb (69.4 kg)  10/03/17 149 lb 11.1 oz (67.9 kg)    Health Maintenance Due  Topic Date Due  . MAMMOGRAM  02/05/2017       Lab Results  Component Value Date   TSH 2.08 01/31/2017   Lab Results  Component Value Date   WBC 6.8 01/11/2018   HGB 9.5 (L) 01/11/2018   HCT 30.1 (L) 01/11/2018   MCV 97.1 01/11/2018   PLT 217 01/11/2018   Lab Results  Component Value Date   NA 137 01/11/2018   K 3.4 (L) 01/11/2018   CO2 23 01/11/2018   GLUCOSE 110 (H) 01/11/2018   BUN 21 01/11/2018   CREATININE 1.08 (H) 01/11/2018   BILITOT 0.6 01/11/2018   ALKPHOS 89 01/11/2018   AST 30 01/11/2018   ALT 26 01/11/2018   PROT 7.4 01/11/2018   ALBUMIN 3.3 (L) 01/11/2018   CALCIUM 9.0 01/11/2018   ANIONGAP 10  01/11/2018   Lab Results  Component Value Date   CHOL 190 10/25/2017   Lab Results  Component Value Date   HDL 23 (L) 10/25/2017   Lab Results  Component Value Date   LDLCALC 112 (H) 10/25/2017   Lab Results  Component Value Date   TRIG 384 (H) 10/25/2017   Lab Results  Component Value Date   CHOLHDL 8.3 (H) 10/25/2017   Lab Results  Component Value Date   HGBA1C 5.7 (H) 11/25/2015       Assessment & Plan:   1. Blood loss Patient in office today after having large amount of bright red blood in her stool x1 over the weekend.  Denied having  any other bloody stools since then.  Due to history of having anemia will order a CBC to check hemoglobin levels.  Pending results will be sent to the emergency room if transfusion is needed.  Family is aware of this and in agreement. Due to the question is whether or not this bleeding is from methotrexate, recommend that she contact the provider who ordered the medication.  As I do not necessarily want to stop something that they feel like would not need to be stopped,  but I do recommend that the methotrexate be stopped.   - CBC  2. Anemia, unspecified type Concern for worsening anemia secondary to having blood loss.  Already has normocytic anemia.  Is on iron orally.  Will be checking hemoglobin via CBC will address hemoglobin if level is low.  Appears to be a current baseline of 10.  Follow-up is as needed, or if symptoms do not improve post stopping the medication, if this is what the other provider would like. Right  Perlie Mayo, NP

## 2018-06-07 LAB — CBC
HCT: 31.6 % — ABNORMAL LOW (ref 35.0–45.0)
Hemoglobin: 10.2 g/dL — ABNORMAL LOW (ref 11.7–15.5)
MCH: 30.7 pg (ref 27.0–33.0)
MCHC: 32.3 g/dL (ref 32.0–36.0)
MCV: 95.2 fL (ref 80.0–100.0)
MPV: 11.1 fL (ref 7.5–12.5)
Platelets: 198 10*3/uL (ref 140–400)
RBC: 3.32 10*6/uL — ABNORMAL LOW (ref 3.80–5.10)
RDW: 13.6 % (ref 11.0–15.0)
WBC: 4.8 10*3/uL (ref 3.8–10.8)

## 2018-06-07 LAB — IRON: Iron: 73 ug/dL (ref 45–160)

## 2018-06-07 LAB — LIPID PANEL
Cholesterol: 153 mg/dL (ref <200)
HDL: 31 mg/dL — ABNORMAL LOW (ref >=50)
LDL Cholesterol (Calc): 95 mg/dL (calc)
Non-HDL Cholesterol (Calc): 122 mg/dL (calc) (ref <130)
Total CHOL/HDL Ratio: 4.9 (calc) (ref <5.0)
Triglycerides: 172 mg/dL — ABNORMAL HIGH (ref <150)

## 2018-06-07 LAB — FERRITIN: Ferritin: 1015 ng/mL — ABNORMAL HIGH (ref 16–288)

## 2018-06-09 ENCOUNTER — Other Ambulatory Visit: Payer: Self-pay | Admitting: Family Medicine

## 2018-06-12 ENCOUNTER — Telehealth: Payer: Self-pay | Admitting: Family Medicine

## 2018-06-12 NOTE — Telephone Encounter (Signed)
pls call and notify pain management that I do NOT prescribe back braces, I have left the  Most recent letter from them requesting a response and also stating their multiple attempts for me to sign. Let them know they will need that order from and Orthopedic dr and pt will be in touch with our office for referral to ortho if that is what they want Pt was just in office, did not request back brace

## 2018-06-13 NOTE — Telephone Encounter (Signed)
Notified company that we do not prescribe braces at this office and it will need to come from specialty

## 2018-06-27 ENCOUNTER — Ambulatory Visit (INDEPENDENT_AMBULATORY_CARE_PROVIDER_SITE_OTHER): Payer: Medicare Other | Admitting: Cardiology

## 2018-06-27 ENCOUNTER — Encounter: Payer: Self-pay | Admitting: Cardiology

## 2018-06-27 VITALS — BP 136/60 | HR 80 | Ht 67.0 in | Wt 158.0 lb

## 2018-06-27 DIAGNOSIS — I10 Essential (primary) hypertension: Secondary | ICD-10-CM

## 2018-06-27 DIAGNOSIS — R600 Localized edema: Secondary | ICD-10-CM | POA: Diagnosis not present

## 2018-06-27 DIAGNOSIS — E782 Mixed hyperlipidemia: Secondary | ICD-10-CM

## 2018-06-27 DIAGNOSIS — I2510 Atherosclerotic heart disease of native coronary artery without angina pectoris: Secondary | ICD-10-CM | POA: Diagnosis not present

## 2018-06-27 NOTE — Patient Instructions (Signed)

## 2018-06-27 NOTE — Progress Notes (Signed)
Clinical Summary Danielle Cruz is a 63 y.o.female  seen today for follow up of the following medical problems.   1. CAD - prior stenting in 2005 to LAD and OM - echo 08/2008 LVEF 55-65%, no WMAs  - no chest pain, no SOB/doe - compliant with meds  2. Hx of CVA - aphasic,has beenon ASA and statin for secondary prevention  3. Hyperlipidemia 10/2017 TC 190 TG 384 HDL 23 LDL 112  - labs followed by pcp. Compliant with statin  4. HTN - she is compliant with meds  5. LE edema - echo 03/2016 LVEF 25-63%, grade I diastolic dysfunction - 11/9371 admission, had AKI. Diuretics stopped. Reports most recent labs at [facility] a few weeks ago  - overall suspect related to venous insufficiency - takes lasix 20mg  daily. Intermittent use of compression stockings.    6. History of rectal bleeding - admit 12/2017 - found to have hemorroids and diverticulsois, no active bleeding.    Past Medical History:  Diagnosis Date  . AKI (acute kidney injury) ([place]) 09/22/2017  . Anemia   . 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 Arthritis   . Colonic polyp 2010   Hemorrhoids; h/o mild hematochezia  . CVA (cerebral infarction) 08/2008   sizable left -residual expressive aphasia, right sided weakness; ambulates with difficulty with the right leg brace   . Hyperlipidemia   . Hypertension 2005  . Rheumatoid arthritis(714.0)   . Stroke [facility])      Allergies  Allergen Reactions  . Ace Inhibitors Cough    New daily cough since starting ACE     Current Outpatient Medications  Medication Sig Dispense Refill  . alendronate (FOSAMAX) 70 MG tablet Take 75 mg by mouth once a week.    Marland Kitchen aspirin EC 81 MG tablet Take 81 mg by mouth daily.    Marland Kitchen docusate sodium (COLACE) 100 MG capsule Take 1 capsule (100 mg total) by mouth 2 (two) times daily. (Patient taking differently: Take 100 mg by mouth every other day. )  10 capsule 0  . fluticasone (FLONASE) 50 MCG/ACT nasal spray Place 1 spray into both nostrils daily. 16 g 6  . folic acid (FOLVITE) 1 MG tablet take one tablet daily  3  . furosemide (LASIX) 20 MG tablet Take 20 mg daily as needed for swelling. 90 tablet 3  . HYDROcodone-acetaminophen (NORCO/VICODIN) 5-325 MG tablet Take 1 tablet by mouth daily as needed.  0  . leflunomide (ARAVA) 20 MG tablet Take 20 mg by mouth daily.      . methotrexate (RHEUMATREX) 2.5 MG tablet Take by mouth once a week. Spouse unsure of dose    . metoprolol succinate (TOPROL-XL) 25 MG 24 hr tablet Take 1 tablet (25 mg total) by mouth daily. 90 tablet 3  . montelukast (SINGULAIR) 10 MG tablet TAKE 1 TABLET(10 MG) BY MOUTH AT BEDTIME 30 tablet 4  . Multiple Vitamin (MULTIVITAMIN WITH MINERALS) TABS tablet Take 1 tablet by mouth daily.    Marland Kitchen oxybutynin (DITROPAN) 5 MG tablet TAKE 1/2 TABLET BY MOUTH TWICE DAILY 90 tablet 0  . pantoprazole (PROTONIX) 40 MG tablet Take 1 tablet (40 mg total) by mouth daily. 30 tablet 5  . potassium chloride SA (K-DUR,KLOR-CON) 20 MEQ tablet TAKE 1 TABLET BY MOUTH EVERY DAY (Patient taking differently: Take 20 mEq by mouth daily. ) 90 tablet 3  . rosuvastatin (CRESTOR)  20 MG tablet Take 1 tablet (20 mg total) by mouth daily. 90 tablet 3   No current facility-administered medications for this visit.      Past Surgical History:  Procedure Laterality Date  . ANKLE SURGERY     Right  . CAROTID STENT INSERTION    . COLONOSCOPY W/ POLYPECTOMY  11/2008   Dr. Gala Romney. Left-sided diverticula, Pedunculated polyp snared (no adenomatous changes)  . COLONOSCOPY WITH PROPOFOL N/A 01/12/2018   Procedure: COLONOSCOPY WITH PROPOFOL;  Surgeon: Daneil Dolin, MD;  [facility];  Service: Endoscopy;  Laterality: N/A;  . FEMUR IM NAIL Right 09/23/2017   Procedure: INTRAMEDULLARY (IM) NAIL FEMORAL;  Surgeon: Renette Butters, MD;  Location: [place];  Service: Orthopedics;  Laterality: Right;  . FRACTURE  SURGERY     Hip replacement in May 2019 - fall related   . OOPHORECTOMY       Allergies  Allergen Reactions  . Ace Inhibitors Cough    New daily cough since starting ACE      Family History  Problem Relation Age of Onset  . Cancer Father        prostate, deceased 84s  . Breast cancer Sister        Deceased 41s  . Heart attack Mother        deceased 34, during childbirth  . Cancer Brother        lymphoma  . Colon cancer Maternal Aunt        older than age 58  . Liver disease Neg Hx      Social History Danielle Cruz reports that she has been smoking cigarettes. She has a 7.50 pack-year smoking history. She has never used smokeless tobacco. Danielle Cruz reports no history of alcohol use.   Review of Systems CONSTITUTIONAL: No weight loss, fever, chills, weakness or fatigue.  HEENT: Eyes: No visual loss, blurred vision, double vision or yellow sclerae.No hearing loss, sneezing, congestion, runny nose or sore throat.  SKIN: No rash or itching.  CARDIOVASCULAR: per hpi RESPIRATORY: No shortness of breath, cough or sputum.  GASTROINTESTINAL: No anorexia, nausea, vomiting or diarrhea. No abdominal pain or blood.  GENITOURINARY: No burning on urination, no polyuria NEUROLOGICAL: No headache, dizziness, syncope, paralysis, ataxia, numbness or tingling in the extremities. No change in bowel or bladder control.  MUSCULOSKELETAL: No muscle, back pain, joint pain or stiffness.  LYMPHATICS: No enlarged nodes. No history of splenectomy.  PSYCHIATRIC: No history of depression or anxiety.  ENDOCRINOLOGIC: No reports of sweating, cold or heat intolerance. No polyuria or polydipsia.  Marland Kitchen   Physical Examination Today's Vitals   06/27/18 1054  BP: 136/60  Pulse: 80  SpO2: 97%  Weight: 158 lb (71.7 kg)  Height: 5\' 7"  (1.702 m)   Body mass index is 24.75 kg/m.  Gen: resting comfortably, no acute distress HEENT: no scleral icterus, pupils equal round and reactive, no palptable cervical  adenopathy,  CV: RRR, 2/6 systolic murmur rusb, no jvd Resp: Clear to auscultation bilaterally GI: abdomen is soft, non-tender, non-distended, normal bowel sounds, no hepatosplenomegaly MSK: extremities are warm, 1+ bialteral LE edema  Skin: warm, no rash Neuro:  no focal deficits Psych: appropriate affect   Diagnostic Studies 07/2003 Cath RESULTS:  HEMODYNAMICS:  1. Left ventricular pressure 145/12.  2. Aortic pressure 150/82.  3. There was no aortic valve gradient.  LEFT VENTRICULOGRAM: Wall motion is normal. Ejection fraction estimated at  greater than or equal to 65%. There is no mitral regurgitation.  CORONARY ARTERIOGRAPHY (CO-DOMINANT):  1. Left main is normal.  2. Left anterior descending artery has a tubular 20% stenosis in the  proximal to mid vessel. The distal LAD has a tubular 80% stenosis. The  apical LAD has a tubular 80% stenosis. The LAD gives rise to a small  diagonal Danielle Cruz.  3. The left circumflex was a large co-dominant vessel. It gives rise to a  large first obtuse marginal Danielle Cruz. There is a 90% stenosis in the mid  portion of the first obtuse marginal Danielle Cruz. There is haziness  associated with this lesion, and it has the appearance of a positive  ruptured plaque. There is a normal sized second obtuse marginal Danielle Cruz,  which has a 50% stenosis proximally and a 60% stenosis distally. The  distal circumflex also gives rise to 2 small posterolateral branches.  4. The right coronary artery is a relatively small co-dominant vessel.  There is diffuse 60% stenosis in the proximal vessel. In the mid vessel  at the acute margin, there is a 20% stenosis and the distal vessel has a  20% stenosis. There is a small to normal sized acute marginal Danielle Cruz,  which has a 70% at its origin. The distal right coronary artery also  gives rise to a small posterior descending coronary artery and a small  posterolateral Danielle Cruz.  IMPRESSION:  1. Normal  left ventricular systolic function.  2. Three-vessel coronary artery disease, as described. The culprit lesion  appears to be the 90% stenosis in the first obtuse marginal Danielle Cruz.  There is also significant disease in the distal LAD and moderate, but  nonobstructive, disease in the small right coronary artery.  PLAN: Percutaneous intervention of the obtuse marginal and the LAD, see  below.  PTCA PROCEDURAL NOTE: Following completion of diagnostic catheterization,  we proceeded with percutaneous coronary intervention. We utilized the  preexisting #6 French sheath in the right femoral artery. Heparin and  Integrilin were administered per protocol. We used a #6 [place] LS 3.5  guiding catheter. A BMW wire was advanced under fluoroscopic guidance into  the distal portion of the first obtuse marginal Danielle Cruz. We then performed  PTCA of the 90% stenosis in the obtuse marginal with a 2.5 x 12 mm Quantum  balloon inflated to 10 atmospheres. We then positioned a 2.5 x 13 mm CYPHER  drug-eluting stent across the area of diseased vessel and deployed the stent  at 15 atmospheres. We then went back with a 2.5 x 12 mm Quantum balloon  within the stent and inflated this balloon to 22 atmospheres. The final  angiographic images were obtained revealing patency of the obtuse marginal  Danielle Cruz with 0% residual stenosis and TIMI 3 flow.  We then turned our attention to the LAD. The BMW wire was advanced under  fluoroscopic guidance into the apical portion of the LAD. We then performed  PTCA of the 80% stenosis in the distal LAD with a 2.0 x 15 mm Quantum  balloon inflated to 12 atmospheres. We then positioned a 2.0 x 15 mm Mini  Vision bare metal stent across the area of segment of disease and deployed  the stent at 12 atmospheres. We then went back with a 2.25 x 12 mm Quantum  balloon and positioning this within the stent, we inflated this balloon to  14 atmospheres in the proximal  and distal edges of the stent. Final  angiographic images are obtained revealing patency of the LAD at the stent  site and with 0% residual stenosis  and TIMI 3 flow.  COMPLICATIONS: None.  RESULTS:  1. Successful PTCA with placement of a drug-eluting stent in the first  obtuse marginal Danielle Cruz. A 90% stenosis with haziness was reduced to 0%  residual with TIMI 3 flow.  2. Successful PTCA with placement of a bare metal stent in the distal left  anterior descending artery. A tubular 80% stenosis was reduced to 0%  residual with TIMI flow.  PLAN: Integrilin will be continued for 18 hours. It is recommended that  the patient will be treated with Plavix for a minimum of 6-9 months. She  also needs aggressive risk factor modification. Regarding the residual  disease in the apical LAD, at this point, would recommend continue medical  therapy. If she has recurrent chest pain, which is refractor to medical  therapy, percutaneous coronary intervention of the apical LAD could be  considered.  08/2008 Echo Study Conclusions  1. Left ventricle: The cavity size was normal. Systolic function was normal. The estimated ejection fraction was in the range of 55% to 65%. Wall motion was normal; there were no regional wall motion abnormalities. 2. Aortic valve: Trivial regurgitation. 3. Mitral valve: No evidence of vegetation. 4. Left atrium: No evidence of thrombus in the atrial cavity or appendage. 5. Atrial septum: Echo contrast study showed no right-to-left atrial level shunt, in the baseline state (Late bubbles felt to be nondiagnostic) There was an atrial septal aneurysm.  06/2015 echo Study Conclusions  - Left ventricle: The cavity size was normal. Wall thickness was increased in a pattern of mild LVH. Systolic function was normal. The estimated ejection fraction was in the range of 60% to 65%. Wall motion was normal; there were no regional wall  motion abnormalities. Doppler parameters are consistent with abnormal left ventricular relaxation (grade 1 diastolic dysfunction). - Atrial septum: No defect or patent foramen ovale was identified.     Assessment and Plan  1. CAD - doing well without symptoms, continue current meds   2. Hx of CVA - she will continue secondary prevention  3. Hyperlipidemia -continue statin, request labs from pcp  4. HTN - at goal, continue current meds  5. LE edema - suspect primary issue is venous insufficiency. Fairly benign echo with only grade I diastolic dysfunction. Attempts at more aggressive diuresis led to AKI in the past. Despite LE edema lungs are clear, no JVD, no cardiopulmonary symptoms, all supporting venous insufficiecny - continue low dose diuretic, encouraged increase use of compression stockings.    F/u 6 months, request pcp labs  Arnoldo Lenis, M.D

## 2018-06-30 ENCOUNTER — Other Ambulatory Visit: Payer: Self-pay | Admitting: Family Medicine

## 2018-07-10 ENCOUNTER — Other Ambulatory Visit: Payer: Self-pay | Admitting: Cardiology

## 2018-07-14 ENCOUNTER — Other Ambulatory Visit: Payer: Self-pay | Admitting: Family Medicine

## 2018-07-14 DIAGNOSIS — E785 Hyperlipidemia, unspecified: Secondary | ICD-10-CM

## 2018-07-14 DIAGNOSIS — Z1231 Encounter for screening mammogram for malignant neoplasm of breast: Secondary | ICD-10-CM

## 2018-07-14 DIAGNOSIS — I10 Essential (primary) hypertension: Secondary | ICD-10-CM

## 2018-08-04 ENCOUNTER — Other Ambulatory Visit: Payer: Self-pay | Admitting: Family Medicine

## 2018-08-05 ENCOUNTER — Other Ambulatory Visit: Payer: Self-pay | Admitting: Cardiology

## 2018-08-23 ENCOUNTER — Other Ambulatory Visit: Payer: Self-pay | Admitting: Family Medicine

## 2018-08-31 DIAGNOSIS — M810 Age-related osteoporosis without current pathological fracture: Secondary | ICD-10-CM | POA: Diagnosis not present

## 2018-08-31 DIAGNOSIS — Z79891 Long term (current) use of opiate analgesic: Secondary | ICD-10-CM | POA: Diagnosis not present

## 2018-08-31 DIAGNOSIS — Z79899 Other long term (current) drug therapy: Secondary | ICD-10-CM | POA: Diagnosis not present

## 2018-08-31 DIAGNOSIS — M0579 Rheumatoid arthritis with rheumatoid factor of multiple sites without organ or systems involvement: Secondary | ICD-10-CM | POA: Diagnosis not present

## 2018-08-31 DIAGNOSIS — G8929 Other chronic pain: Secondary | ICD-10-CM | POA: Diagnosis not present

## 2018-08-31 DIAGNOSIS — M25579 Pain in unspecified ankle and joints of unspecified foot: Secondary | ICD-10-CM | POA: Diagnosis not present

## 2018-08-31 DIAGNOSIS — M2550 Pain in unspecified joint: Secondary | ICD-10-CM | POA: Diagnosis not present

## 2018-09-14 ENCOUNTER — Encounter: Payer: Self-pay | Admitting: *Deleted

## 2018-09-26 ENCOUNTER — Ambulatory Visit (INDEPENDENT_AMBULATORY_CARE_PROVIDER_SITE_OTHER): Payer: Medicare Other | Admitting: Family Medicine

## 2018-09-26 ENCOUNTER — Other Ambulatory Visit: Payer: Self-pay

## 2018-09-26 ENCOUNTER — Encounter: Payer: Self-pay | Admitting: Family Medicine

## 2018-09-26 VITALS — BP 136/60 | Ht 67.0 in | Wt 158.0 lb

## 2018-09-26 DIAGNOSIS — Z720 Tobacco use: Secondary | ICD-10-CM | POA: Diagnosis not present

## 2018-09-26 DIAGNOSIS — I2510 Atherosclerotic heart disease of native coronary artery without angina pectoris: Secondary | ICD-10-CM

## 2018-09-26 DIAGNOSIS — E785 Hyperlipidemia, unspecified: Secondary | ICD-10-CM

## 2018-09-26 DIAGNOSIS — Z9181 History of falling: Secondary | ICD-10-CM | POA: Diagnosis not present

## 2018-09-26 DIAGNOSIS — I69051 Hemiplegia and hemiparesis following nontraumatic subarachnoid hemorrhage affecting right dominant side: Secondary | ICD-10-CM

## 2018-09-26 DIAGNOSIS — I10 Essential (primary) hypertension: Secondary | ICD-10-CM | POA: Diagnosis not present

## 2018-09-26 DIAGNOSIS — F17210 Nicotine dependence, cigarettes, uncomplicated: Secondary | ICD-10-CM

## 2018-09-26 NOTE — Patient Instructions (Addendum)
Annual physical exam with MD, or follow up with MD mid September, call if you need me sooner, flu vaccine at that visit  Thankful that you are doing better overall, especially glad to hear that you are walking more in the past 3 days, and your husband reports no problems with getting up and moving around!   Please get fasting lipid, cmp and eGFR aand tSH 1 week before your next visit  Now down to 5 cigarettes/ day, quit date is set for July 5, WONDERFUL, and I am encouraging you along!  Careful not to fall, and keep moving!  Thanks for choosing [facility], we consider it a privelige to serve you. Social distancing. Frequent hand washing with soap and water Keeping your hands off of your face. These 3 practices will help to keep both you and your community healthy during this time. Please practice them faithfully!

## 2018-09-26 NOTE — Progress Notes (Signed)
Virtual Visit via Telephone Note  I connected with Danielle Cruz on 09/26/18 at  2:20 PM EDT by telephone and verified that I am speaking with the correct person using two identifiers.  Location: Patient:home Provider: office   I discussed the limitations, risks, security and privacy concerns of performing an evaluation and management service by telephone and the availability of in person appointments. I also discussed with the patient that there may be a patient responsible charge related to this service. The patient expressed understanding and agreed to proceed.   History of Present Illness: F/U chronic prob;ems Spouse is main historian Reports doing better, cutting back pon smoking and increased walking, no falls Reports good appetie, denies skin breakdown. Reports regularBm and no malodorous urine Denies recent fever or chills. Denies sinus pressure, nasal congestion, ear pain or sore throat. Denies chest congestion, productive cough or wheezing. Denies  leg swelling Denies abdominal pain, nausea, vomiting,diarrhea or constipation.   Denies dysuria, frequency, hesitancy or incontinence. .Denies headaches, seizures, numbness, or tingling. Denies depression, anxiety or insomnia. Denies skin break down or rash.       Observations/Objective: BP 136/60   Ht 5\' 7"  (1.702 m)   Wt 158 lb (71.7 kg)   BMI 24.75 kg/m  Good communication with no confusion and intact memory. Alert and oriented x 3 No signs of respiratory distress during speech    Assessment and Plan: Essential hypertension Controlled, no change in medication   Hyperlipemia Hyperlipidemia:Low fat diet discussed and encouraged.   Lipid Panel  Lab Results  Component Value Date   CHOL 153 06/06/2018   HDL 31 (L) 06/06/2018   LDLCALC 95 06/06/2018   TRIG 172 (H) 06/06/2018   CHOLHDL 4.9 06/06/2018     Needs to lower fat intake Updated lab needed at/ before next visit.   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 The patient was provided an opportunity to ask questions and all were answered. The patient agreed with the plan and demonstrated an understanding of the instructions.   The patient was advised to call back or seek an in-person evaluation if the symptoms worsen or if the condition fails to improve as anticipated.  I provided 22 minutes of non-face-to-face time during this encounter.   Tula Nakayama, MD

## 2018-10-01 ENCOUNTER — Encounter: Payer: Self-pay | Admitting: Family Medicine

## 2018-10-01 NOTE — Assessment & Plan Note (Signed)
Controlled, no change in medication  

## 2018-10-01 NOTE — Assessment & Plan Note (Signed)
Home safety reviewed, no falls reported since last visit

## 2018-10-01 NOTE — Assessment & Plan Note (Signed)
Asked:confirms currently smokes cigarettes, 5/day Assess: willing to quit and  cutting back, quit date set by spouse Advise: needs to QUIT to reduce risk of cancer, cardio and cerebrovascular disease, already has CVD Assist: counseled for 5 minutes and literature provided Arrange: follow up in 3 months

## 2018-10-01 NOTE — Assessment & Plan Note (Signed)
Hyperlipidemia:Low fat diet discussed and encouraged.   Lipid Panel  Lab Results  Component Value Date   CHOL 153 06/06/2018   HDL 31 (L) 06/06/2018   LDLCALC 95 06/06/2018   TRIG 172 (H) 06/06/2018   CHOLHDL 4.9 06/06/2018     Needs to lower fat intake Updated lab needed at/ before next visit.

## 2018-10-01 NOTE — Assessment & Plan Note (Signed)
Unchanged , however report is that her mobility has improved, following healing of fracture

## 2018-11-15 ENCOUNTER — Other Ambulatory Visit: Payer: Self-pay | Admitting: Family Medicine

## 2018-11-22 ENCOUNTER — Other Ambulatory Visit: Payer: Self-pay

## 2018-11-22 NOTE — Addendum Note (Signed)
Addended by: Eual Fines on: 11/22/2018 04:27 PM   Modules accepted: Orders

## 2018-11-29 ENCOUNTER — Telehealth: Payer: Self-pay

## 2018-11-29 MED ORDER — UNABLE TO FIND: 11 refills | Status: DC

## 2018-12-04 NOTE — Telephone Encounter (Signed)
error 

## 2018-12-08 DIAGNOSIS — M0579 Rheumatoid arthritis with rheumatoid factor of multiple sites without organ or systems involvement: Secondary | ICD-10-CM | POA: Diagnosis not present

## 2018-12-08 DIAGNOSIS — M2550 Pain in unspecified joint: Secondary | ICD-10-CM | POA: Diagnosis not present

## 2018-12-08 DIAGNOSIS — G8929 Other chronic pain: Secondary | ICD-10-CM | POA: Diagnosis not present

## 2018-12-08 DIAGNOSIS — Z79899 Other long term (current) drug therapy: Secondary | ICD-10-CM | POA: Diagnosis not present

## 2018-12-08 DIAGNOSIS — Z79891 Long term (current) use of opiate analgesic: Secondary | ICD-10-CM | POA: Diagnosis not present

## 2018-12-08 DIAGNOSIS — M25579 Pain in unspecified ankle and joints of unspecified foot: Secondary | ICD-10-CM | POA: Diagnosis not present

## 2018-12-08 DIAGNOSIS — M810 Age-related osteoporosis without current pathological fracture: Secondary | ICD-10-CM | POA: Diagnosis not present

## 2018-12-08 DIAGNOSIS — Z5181 Encounter for therapeutic drug level monitoring: Secondary | ICD-10-CM | POA: Diagnosis not present

## 2018-12-08 DIAGNOSIS — R748 Abnormal levels of other serum enzymes: Secondary | ICD-10-CM | POA: Diagnosis not present

## 2018-12-22 ENCOUNTER — Other Ambulatory Visit: Payer: Self-pay | Admitting: Family Medicine

## 2018-12-26 ENCOUNTER — Telehealth: Payer: Self-pay | Admitting: *Deleted

## 2018-12-26 NOTE — Telephone Encounter (Signed)
[place] CNA called and said Danielle Cruz needed refills on incontinence pads and pull ups.

## 2018-12-27 ENCOUNTER — Other Ambulatory Visit: Payer: Self-pay

## 2018-12-27 MED ORDER — UNABLE TO FIND: 11 refills | Status: DC

## 2018-12-27 NOTE — Telephone Encounter (Signed)
Husband aware that the supplies have been sent in

## 2019-01-02 ENCOUNTER — Telehealth: Payer: Self-pay | Admitting: Family Medicine

## 2019-01-02 ENCOUNTER — Other Ambulatory Visit: Payer: Self-pay

## 2019-01-02 MED ORDER — UNABLE TO FIND: 11 refills | Status: AC

## 2019-01-02 NOTE — Telephone Encounter (Signed)
Danielle Cruz is calling she needs rx for Underpads and pull up sent to [facility]

## 2019-01-02 NOTE — Telephone Encounter (Signed)
Prescription re-faxed to [facility]

## 2019-01-04 ENCOUNTER — Ambulatory Visit: Payer: Medicare Other | Admitting: Family Medicine

## 2019-01-08 ENCOUNTER — Other Ambulatory Visit: Payer: Self-pay | Admitting: Family Medicine

## 2019-01-08 DIAGNOSIS — Z1231 Encounter for screening mammogram for malignant neoplasm of breast: Secondary | ICD-10-CM

## 2019-01-08 DIAGNOSIS — E785 Hyperlipidemia, unspecified: Secondary | ICD-10-CM

## 2019-01-08 DIAGNOSIS — I10 Essential (primary) hypertension: Secondary | ICD-10-CM

## 2019-01-10 DIAGNOSIS — Z79899 Other long term (current) drug therapy: Secondary | ICD-10-CM | POA: Diagnosis not present

## 2019-01-22 ENCOUNTER — Encounter: Payer: Self-pay | Admitting: Family Medicine

## 2019-01-22 ENCOUNTER — Other Ambulatory Visit: Payer: Self-pay

## 2019-01-22 ENCOUNTER — Ambulatory Visit (INDEPENDENT_AMBULATORY_CARE_PROVIDER_SITE_OTHER): Payer: Medicare Other | Admitting: Family Medicine

## 2019-01-22 VITALS — BP 130/80 | HR 70 | Temp 98.7°F | Resp 15 | Ht 67.0 in | Wt 169.0 lb

## 2019-01-22 DIAGNOSIS — F17210 Nicotine dependence, cigarettes, uncomplicated: Secondary | ICD-10-CM | POA: Diagnosis not present

## 2019-01-22 DIAGNOSIS — D649 Anemia, unspecified: Secondary | ICD-10-CM | POA: Diagnosis not present

## 2019-01-22 DIAGNOSIS — R7301 Impaired fasting glucose: Secondary | ICD-10-CM

## 2019-01-22 DIAGNOSIS — I2510 Atherosclerotic heart disease of native coronary artery without angina pectoris: Secondary | ICD-10-CM

## 2019-01-22 DIAGNOSIS — I69051 Hemiplegia and hemiparesis following nontraumatic subarachnoid hemorrhage affecting right dominant side: Secondary | ICD-10-CM

## 2019-01-22 DIAGNOSIS — Z720 Tobacco use: Secondary | ICD-10-CM | POA: Diagnosis not present

## 2019-01-22 DIAGNOSIS — Z23 Encounter for immunization: Secondary | ICD-10-CM | POA: Diagnosis not present

## 2019-01-22 DIAGNOSIS — E559 Vitamin D deficiency, unspecified: Secondary | ICD-10-CM | POA: Diagnosis not present

## 2019-01-22 DIAGNOSIS — Z0000 Encounter for general adult medical examination without abnormal findings: Secondary | ICD-10-CM | POA: Insufficient documentation

## 2019-01-22 DIAGNOSIS — I10 Essential (primary) hypertension: Secondary | ICD-10-CM | POA: Diagnosis not present

## 2019-01-22 DIAGNOSIS — E785 Hyperlipidemia, unspecified: Secondary | ICD-10-CM

## 2019-01-22 NOTE — Assessment & Plan Note (Signed)
Annual exam as documented. Counseling done  re healthy lifestyle involving commitment to 150 minutes exercise per week, heart healthy diet, and attaining healthy weight.The importance of adequate sleep also discussed.  Immunization and cancer screening needs are specifically addressed at this visit.  

## 2019-01-22 NOTE — Progress Notes (Signed)
    Danielle Cruz     MRN: [id number]      DOB: 04-Jan-1956  HPI: Patient is in for routine follow up  health concerns are expressed   c/o leg swelling, denies PND or orthopnea, reportsd good urine output Recent labs, if available are reviewed. Immunization is reviewed , and  updated if needed.   PE: BP 130/80   Pulse 70   Temp 98.7 F (37.1 C) (Temporal)   Resp 15   Ht 5\' 7"  (1.702 m)   Wt 169 lb (76.7 kg)   SpO2 98%   BMI 26.47 kg/m   Pleasant  female, alert , in no cardio-pulmonary distress. Afebrile. HEENT No facial trauma or asymetry. Sinuses non tender.  Extra occullar muscles intact, . . Neck: decreased ROM, no adenopathy,JVD or thyromegaly.No bruits.  Chest: Clear to ascultation bilaterally.No crackles or wheezes. Non tender to palpation  Breast: No asymetry,no masses or lumps. No tenderness. No nipple discharge or inversion. No axillary or supraclavicular adenopathy  Cardiovascular system; Heart sounds normal,  S1 and  S2 ,no S3.  No murmur, or thrill. Apical beat not displaced Peripheral pulses normal.  Abdomen: Soft, non tender, No guarding, tenderness or rebound.  Rectal:  .   Musculoskeletal exam: Grade 0 power in RUE, grade 1 in RLE, garde 5 power in LUE and LLE.  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 Wheelchair dependent. No tremor.  Skin: Intact, no ulceration, erythema , scaling or rash noted. Pigmentation normal throughout  Psych; Normal mood and affect.    Assessment & Plan:  Annual physical exam Annual exam as documented. Counseling done  re healthy lifestyle involving commitment to 150 minutes exercise per week, heart healthy diet, and attaining healthy weight.The importance of adequate sleep also discussed.  Immunization and cancer screening needs are specifically addressed at this visit.   Tobacco abuse The patient smokes 0.5 packs per day.  Asked:confirms currently smokes cigarettes Assess: Unwilling to quit but cutting back Advise: needs to QUIT to reduce risk of cancer, cardio and cerebrovascular disease Assist: counseled for 5 minutes and literature provided Arrange: follow up in 3 months   Hyperlipemia Hyperlipidemia:Low fat diet discussed and encouraged.   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 The importance of attaining a healthy weight is also discussed.  BP/Weight 01/22/2019 09/26/2018 06/27/2018 06/06/2018 05/24/2018 03/03/2018 [id number]  Systolic BP [id number]  Diastolic BP 80 60 60 78 82 64 77  Wt. (Lbs) 169 158 158 - 143.08 153 -  BMI 26.47 24.75 24.75 - 22.41 23.96 23.45

## 2019-01-22 NOTE — Assessment & Plan Note (Signed)
UNCHANGED, HOME safety discussed, no falls

## 2019-01-22 NOTE — Assessment & Plan Note (Signed)
Controlled, no change in medication DASH diet and commitment to daily physical activity for a minimum of 30 minutes discussed and encouraged, as a part of hypertension management. The importance of attaining a healthy weight is also discussed.  BP/Weight 01/22/2019 09/26/2018 06/27/2018 06/06/2018 05/24/2018 03/03/2018 [id number]  Systolic BP [id number] [id number]  Diastolic BP 80 60 60 78 82 64 77  Wt. (Lbs) 169 158 158 - 143.08 153 -  BMI 26.47 24.75 24.75 - 22.41 23.96 23.45

## 2019-01-22 NOTE — Assessment & Plan Note (Addendum)
Hyperlipidemia:Low fat diet discussed and encouraged.   Lipid Panel  Lab Results  Component Value Date   CHOL 153 06/06/2018   HDL 31 (L) 06/06/2018   LDLCALC 95 06/06/2018   TRIG 172 (H) 06/06/2018   CHOLHDL 4.9 06/06/2018     Updated lab needed

## 2019-01-22 NOTE — Patient Instructions (Addendum)
F/U with MD in 4 months, in office pap at visit, call if you need me sooner  Flu and pneumonia 23 vaccines today  Labs today, lipid, cmp and EGFR, TSH and CBC and Vit D  Start cutting back by 1 cigarette every month, so 9 for October, 8/day for November etc  Thankful you are doing well, exam is good  Continue to be careful not to fall  Best for the rest of the year into 2021

## 2019-01-22 NOTE — Assessment & Plan Note (Signed)
The patient smokes 0.5 packs per day. Asked:confirms currently smokes cigarettes Assess: Unwilling to quit but cutting back Advise: needs to QUIT to reduce risk of cancer, cardio and cerebrovascular disease Assist: counseled for 5 minutes and literature provided Arrange: follow up in 3 months

## 2019-01-23 LAB — COMPLETE METABOLIC PANEL WITH GFR
AG Ratio: 1.2 (calc) (ref 1.0–2.5)
ALT: 57 U/L — ABNORMAL HIGH (ref 6–29)
AST: 77 U/L — ABNORMAL HIGH (ref 10–35)
Albumin: 3.6 g/dL (ref 3.6–5.1)
Alkaline phosphatase (APISO): 102 U/L (ref 37–153)
BUN: 7 mg/dL (ref 7–25)
CO2: 25 mmol/L (ref 20–32)
Calcium: 8.9 mg/dL (ref 8.6–10.4)
Chloride: 113 mmol/L — ABNORMAL HIGH (ref 98–110)
Creat: 0.83 mg/dL (ref 0.50–0.99)
GFR, Est African American: 87 mL/min/{1.73_m2} (ref >=60)
GFR, Est Non African American: 75 mL/min/{1.73_m2} (ref >=60)
Globulin: 2.9 g/dL (calc) (ref 1.9–3.7)
Glucose, Bld: 113 mg/dL — ABNORMAL HIGH (ref 65–99)
Potassium: 3.9 mmol/L (ref 3.5–5.3)
Sodium: 142 mmol/L (ref 135–146)
Total Bilirubin: 0.4 mg/dL (ref 0.2–1.2)
Total Protein: 6.5 g/dL (ref 6.1–8.1)

## 2019-01-23 LAB — TSH: TSH: 2.77 mIU/L (ref 0.40–4.50)

## 2019-01-23 LAB — CBC
HCT: 32.3 % — ABNORMAL LOW (ref 35.0–45.0)
Hemoglobin: 10.5 g/dL — ABNORMAL LOW (ref 11.7–15.5)
MCH: 33 pg (ref 27.0–33.0)
MCHC: 32.5 g/dL (ref 32.0–36.0)
MCV: 101.6 fL — ABNORMAL HIGH (ref 80.0–100.0)
MPV: 11.3 fL (ref 7.5–12.5)
Platelets: 149 10*3/uL (ref 140–400)
RBC: 3.18 10*6/uL — ABNORMAL LOW (ref 3.80–5.10)
RDW: 14.5 % (ref 11.0–15.0)
WBC: 5.5 10*3/uL (ref 3.8–10.8)

## 2019-01-23 LAB — LIPID PANEL
Cholesterol: 126 mg/dL (ref <200)
HDL: 23 mg/dL — ABNORMAL LOW (ref >=50)
LDL Cholesterol (Calc): 69 mg/dL (calc)
Non-HDL Cholesterol (Calc): 103 mg/dL (calc) (ref <130)
Total CHOL/HDL Ratio: 5.5 (calc) — ABNORMAL HIGH (ref <5.0)
Triglycerides: 246 mg/dL — ABNORMAL HIGH (ref <150)

## 2019-01-23 LAB — VITAMIN D 25 HYDROXY (VIT D DEFICIENCY, FRACTURES): Vit D, 25-Hydroxy: 59 ng/mL (ref 30–100)

## 2019-01-24 ENCOUNTER — Other Ambulatory Visit: Payer: Self-pay | Admitting: Family Medicine

## 2019-01-24 MED ORDER — EZETIMIBE 10 MG PO TABS: 10.0000 mg | ORAL_TABLET | Freq: Every day | ORAL | 3 refills | Status: DC

## 2019-01-24 MED ORDER — ROSUVASTATIN CALCIUM 20 MG PO TABS: ORAL_TABLET | ORAL | 1 refills | Status: DC

## 2019-01-26 ENCOUNTER — Telehealth: Payer: Self-pay

## 2019-01-26 DIAGNOSIS — I10 Essential (primary) hypertension: Secondary | ICD-10-CM

## 2019-01-26 DIAGNOSIS — E785 Hyperlipidemia, unspecified: Secondary | ICD-10-CM

## 2019-01-26 DIAGNOSIS — R748 Abnormal levels of other serum enzymes: Secondary | ICD-10-CM

## 2019-01-26 NOTE — Telephone Encounter (Signed)
Labs ordered to be drawn in Dec and Feb

## 2019-02-12 ENCOUNTER — Ambulatory Visit: Payer: Medicare Other

## 2019-02-12 ENCOUNTER — Other Ambulatory Visit: Payer: Self-pay

## 2019-02-12 MED ORDER — OXYBUTYNIN CHLORIDE 5 MG PO TABS: 2.5000 mg | ORAL_TABLET | Freq: Two times a day (BID) | ORAL | 0 refills | Status: DC

## 2019-02-13 ENCOUNTER — Encounter: Payer: Self-pay | Admitting: Family Medicine

## 2019-02-13 ENCOUNTER — Other Ambulatory Visit: Payer: Self-pay

## 2019-02-13 ENCOUNTER — Ambulatory Visit (INDEPENDENT_AMBULATORY_CARE_PROVIDER_SITE_OTHER): Payer: Medicare Other | Admitting: Family Medicine

## 2019-02-13 VITALS — BP 130/80 | HR 70 | Temp 98.5°F | Resp 15 | Ht 67.0 in | Wt 169.0 lb

## 2019-02-13 DIAGNOSIS — Z0000 Encounter for general adult medical examination without abnormal findings: Secondary | ICD-10-CM

## 2019-02-13 NOTE — Patient Instructions (Signed)
Danielle Cruz , Thank you for taking time to come for your Medicare Wellness Visit. I appreciate your ongoing commitment to your health goals. Please review the following plan we discussed and let me know if I can assist you in the future.   Please continue to practice social distancing to keep you, your family, and our community safe.  If you must go out, please wear a Mask and practice good handwashing.  We hope that you have a safe, healthy and wonderful holiday season.  Please let us know if you need [place] before your next appointment.  Screening recommendations/referrals: Colonoscopy: Due 2029 Mammogram: Unable to get at this time Bone Density: He reported that Dr. Did get this and that it looks good.  Continue to get calcium and vitamin D keep bones healthy. Recommended yearly ophthalmology/optometry visit for glaucoma screening and checkup Recommended yearly dental visit for hygiene and checkup  Vaccinations: Influenza vaccine: Completed due fall 2021 Pneumococcal vaccine: completed, 23 due after age 33 Tdap vaccine: Due 2022 Shingles vaccine: Check insurance coverage for this.     Advanced directives: Please consider getting paperwork for this if you need help required to help.  Conditions/risks identified: Falls  Next appointment: 07/09/2019, and in 1 year for AWV   Preventive Care 40-64 Years, Female Preventive care refers to lifestyle choices and visits with your health care provider that can promote health and wellness. What does preventive care include?  A yearly physical exam. This is also called an annual well check.  Dental exams once or twice a year.  Routine eye exams. Ask your health care provider how often you should have your eyes checked.  Personal lifestyle choices, including:  Daily care of your teeth and gums.  Regular physical activity.  Eating a healthy diet.  Avoiding tobacco and drug use.  Limiting alcohol use.  Practicing safe sex.  Taking  low-dose aspirin daily starting at age 25.  Taking vitamin and mineral supplements as recommended by your health care provider. What happens during an annual well check? The services and screenings done by your health care provider during your annual well check will depend on your age, overall health, lifestyle risk factors, and family history of disease. Counseling  Your health care provider may ask you questions about your:  Alcohol use.  Tobacco use.  Drug use.  Emotional well-being.  Home and relationship well-being.  Sexual activity.  Eating habits.  Work and work Statistician.  Method of birth control.  Menstrual cycle.  Pregnancy history. Screening  You may have the following tests or measurements:  Height, weight, and BMI.  Blood pressure.  Lipid and cholesterol levels. These may be checked every 5 years, or more frequently if you are over 7 years old.  Skin check.  Lung cancer screening. You may have this screening every year starting at age 13 if you have a 30-pack-year history of smoking and currently smoke or have quit within the past 15 years.  Fecal occult blood test (FOBT) of the stool. You may have this test every year starting at age 61.  Flexible sigmoidoscopy or colonoscopy. You may have a sigmoidoscopy every 5 years or a colonoscopy every 10 years starting at age 27.  Hepatitis C blood test.  Hepatitis B blood test.  Sexually transmitted disease (STD) testing.  Diabetes screening. This is done by checking your blood sugar (glucose) after you have not eaten for a while (fasting). You may have this done every 1-3 years.  Mammogram. This may  be done every 1-2 years. Talk to your health care provider about when you should start having regular mammograms. This may depend on whether you have a family history of breast cancer.  BRCA-related cancer screening. This may be done if you have a family history of breast, ovarian, tubal, or peritoneal  cancers.  Pelvic exam and Pap test. This may be done every 3 years starting at age 107. Starting at age 57, this may be done every 5 years if you have a Pap test in combination with an HPV test.  Bone density scan. This is done to screen for osteoporosis. You may have this scan if you are at high risk for osteoporosis. Discuss your test results, treatment options, and if necessary, the need for more tests with your health care provider. Vaccines  Your health care provider may recommend certain vaccines, such as:  Influenza vaccine. This is recommended every year.  Tetanus, diphtheria, and acellular pertussis (Tdap, Td) vaccine. You may need a Td booster every 10 years.  Zoster vaccine. You may need this after age 34.  Pneumococcal 13-valent conjugate (PCV13) vaccine. You may need this if you have certain conditions and were not previously vaccinated.  Pneumococcal polysaccharide (PPSV23) vaccine. You may need one or two doses if you smoke cigarettes or if you have certain conditions. Talk to your health care provider about which screenings and vaccines you need and how often you need them. This information is not intended to replace advice given to you by your health care provider. Make sure you discuss any questions you have with your health care provider. Document Released: 05/02/2015 Document Revised: 12/24/2015 Document Reviewed: 02/04/2015 Elsevier Interactive Patient Education  2017 [place] Prevention in the Home Falls can cause injuries. They can happen to people of all ages. There are many things you can do to make your home safe and to help prevent falls. What can I do on the outside of my home?  Regularly fix the edges of walkways and driveways and fix any cracks.  Remove anything that might make you trip as you walk through a door, such as a raised step or threshold.  Trim any bushes or trees on the path to your home.  Use bright outdoor lighting.  Clear  any walking paths of anything that might make someone trip, such as rocks or tools.  Regularly check to see if handrails are loose or broken. Make sure that both sides of any steps have handrails.  Any raised decks and porches should have guardrails on the edges.  Have any leaves, snow, or ice cleared regularly.  Use sand or salt on walking paths during winter.  Clean up any spills in your garage right away. This includes oil or grease spills. What can I do in the bathroom?  Use night lights.  Install grab bars by the toilet and in the tub and shower. Do not use towel bars as grab bars.  Use non-skid mats or decals in the tub or shower.  If you need to sit down in the shower, use a plastic, non-slip stool.  Keep the floor dry. Clean up any water that spills on the floor as soon as it happens.  Remove soap buildup in the tub or shower regularly.  Attach bath mats securely with double-sided non-slip rug tape.  Do not have throw rugs and other things on the floor that can make you trip. What can I do in the bedroom?  Use night  lights.  Make sure that you have a light by your bed that is easy to reach.  Do not use any sheets or blankets that are too big for your bed. They should not hang down onto the floor.  Have a firm chair that has side arms. You can use this for support while you get dressed.  Do not have throw rugs and other things on the floor that can make you trip. What can I do in the kitchen?  Clean up any spills right away.  Avoid walking on wet floors.  Keep items that you use a lot in easy-to-reach places.  If you need to reach something above you, use a strong step stool that has a grab bar.  Keep electrical cords out of the way.  Do not use floor polish or wax that makes floors slippery. If you must use wax, use non-skid floor wax.  Do not have throw rugs and other things on the floor that can make you trip. What can I do with my stairs?  Do not  leave any items on the stairs.  Make sure that there are handrails on both sides of the stairs and use them. Fix handrails that are broken or loose. Make sure that handrails are as long as the stairways.  Check any carpeting to make sure that it is firmly attached to the stairs. Fix any carpet that is loose or worn.  Avoid having throw rugs at the top or bottom of the stairs. If you do have throw rugs, attach them to the floor with carpet tape.  Make sure that you have a light switch at the top of the stairs and the bottom of the stairs. If you do not have them, ask someone to add them for you. What else can I do to help prevent falls?  Wear shoes that:  Do not have high heels.  Have rubber bottoms.  Are comfortable and fit you well.  Are closed at the toe. Do not wear sandals.  If you use a stepladder:  Make sure that it is fully opened. Do not climb a closed stepladder.  Make sure that both sides of the stepladder are locked into place.  Ask someone to hold it for you, if possible.  Clearly mark and make sure that you can see:  Any grab bars or handrails.  First and last steps.  Where the edge of each step is.  Use tools that help you move around (mobility aids) if they are needed. These include:  Canes.  Walkers.  Scooters.  Crutches.  Turn on the lights when you go into a dark area. Replace any light bulbs as soon as they burn out.  Set up your furniture so you have a clear path. Avoid moving your furniture around.  If any of your floors are uneven, fix them.  If there are any pets around you, be aware of where they are.  Review your medicines with your doctor. Some medicines can make you feel dizzy. This can increase your chance of falling. Ask your doctor what other things that you can do to help prevent falls. This information is not intended to replace advice given to you by your health care provider. Make sure you discuss any questions you have with  your health care provider. Document Released: 01/30/2009 Document Revised: 09/11/2015 Document Reviewed: 05/10/2014 Elsevier Interactive Patient Education  2017 [employer].

## 2019-02-13 NOTE — Progress Notes (Signed)
Subjective:   Danielle Cruz is a 63 y.o. female who presents for Medicare Annual (Subsequent) preventive examination.  Location of Patient: Home Location of Provider: Telehealth Consent was obtain for visit to be over via telehealth.  I verified that I am speaking with the correct person using two identifiers. Husband is historian  Review of Systems:   Cardiac Risk Factors include: hypertension;sedentary lifestyle;smoking/ tobacco exposure     Objective:     Vitals: Temp 98.5 F (36.9 C)   There is no height or weight on file to calculate BMI.  Advanced Directives 02/13/2019 01/11/2018 10/03/2017 09/22/2017 03/21/2017 11/28/2013  Does Patient Have a Medical Advance Directive? No No Yes No No No  Type of Advance Directive - - [place]  Does patient want to make changes to medical advance directive? - - No - Patient declined - - -  Copy of [place] in Chart? - - No - copy requested - - -  Would patient like information on creating a medical advance directive? No - Guardian declined Yes (Inpatient - patient requests chaplain consult to create a medical advance directive) No - Patient declined No - Patient declined No - Patient declined Yes - Educational materials given    Tobacco Social History   Tobacco Use  Smoking Status Current Every Day Smoker  . Packs/day: 0.50  . Years: 15.00  . Pack years: 7.50  . Types: Cigarettes  Smokeless Tobacco Never Used  Tobacco Comment   quit after hospitized for hip fracture      Ready to quit: Yes Counseling given: Yes Comment: quit after hospitized for hip fracture    Clinical Intake:  Pre-visit preparation completed: No  Pain : No/denies pain     Diabetes: No  How often do you need to have someone help you when you read instructions, pamphlets, or other written materials from your doctor or pharmacy?: 4 - Often  Interpreter Needed?: No     Past Medical History:  Diagnosis Date   . AKI (acute kidney injury) ([place]) 09/22/2017  . Anemia   . 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 Arthritis   . Colonic polyp 2010   Hemorrhoids; h/o mild hematochezia  . CVA (cerebral infarction) 08/2008   sizable left -residual expressive aphasia, right sided weakness; ambulates with difficulty with the right leg brace   . Hyperlipidemia   . Hypertension 2005  . Rheumatoid arthritis(714.0)   . Stroke [facility])    Past Surgical History:  Procedure Laterality Date  . ANKLE SURGERY     Right  . CAROTID STENT INSERTION    . COLONOSCOPY W/ POLYPECTOMY  11/2008   Dr. Gala Romney. Left-sided diverticula, Pedunculated polyp snared (no adenomatous changes)  . COLONOSCOPY WITH PROPOFOL N/A 01/12/2018   Procedure: COLONOSCOPY WITH PROPOFOL;  Surgeon: Daneil Dolin, MD;  [facility];  Service: Endoscopy;  Laterality: N/A;  . FEMUR IM NAIL Right 09/23/2017   Procedure: INTRAMEDULLARY (IM) NAIL FEMORAL;  Surgeon: Renette Butters, MD;  Location: [facility];  Service: Orthopedics;  Laterality: Right;  . FRACTURE SURGERY     Hip replacement in May 2019 - fall related   . OOPHORECTOMY     Family History  Problem Relation Age of Onset  . Cancer Father        prostate, deceased 4s  . Breast cancer Sister  Deceased 17s  . Heart attack Mother        deceased 37, during childbirth  . Cancer Brother        lymphoma  . Colon cancer Maternal Aunt        older than age 61  . Liver disease Neg Hx    Social History   Socioeconomic History  . Marital status: Married    Spouse name: Not on file  . Number of children: 3  . Years of education: 11th grade  . Highest education level: 11th grade  Occupational History  . Occupation: disabilty x 9years    Comment: secondary to arthritis   Social Needs  . Financial resource strain: Not very hard  . Food insecurity    Worry: Never true    Inability: Never true  .  Transportation needs    Medical: No    Non-medical: No  Tobacco Use  . Smoking status: Current Every Day Smoker    Packs/day: 0.50    Years: 15.00    Pack years: 7.50    Types: Cigarettes  . Smokeless tobacco: Never Used  . Tobacco comment: quit after hospitized for hip fracture   Substance and Sexual Activity  . Alcohol use: No    Alcohol/week: 0.0 standard drinks    Comment: quit 6 years ago   . Drug use: No  . Sexual activity: Not on file  Lifestyle  . Physical activity    Days per week: 0 days    Minutes per session: 0 min  . Stress: Not at all  Relationships  . Social Herbalist on phone: Once a week    Gets together: More than three times a week    Attends religious service: Never    Active member of club or organization: No    Attends meetings of clubs or organizations: Never    Relationship status: Married  Other Topics Concern  . Not on file  Social History Narrative   Pt gave birth to 4 children, 1 deceased at 75 weeks was a premature baby, 3 are living ages range 19 to 34 all healthy     Outpatient Encounter Medications as of 02/13/2019  Medication Sig  . aspirin EC 81 MG tablet Take 81 mg by mouth daily.  Marland Kitchen ezetimibe (ZETIA) 10 MG tablet Take 1 tablet (10 mg total) by mouth daily.  . fluticasone (FLONASE) 50 MCG/ACT nasal spray SHAKE LIQUID AND USE 1 SPRAY IN EACH NOSTRIL DAILY  . HYDROcodone-acetaminophen (NORCO/VICODIN) 5-325 MG tablet Take 1 tablet by mouth daily as needed.  . methotrexate (RHEUMATREX) 2.5 MG tablet Take by mouth once a week. Spouse unsure of dose  . metoprolol succinate (TOPROL-XL) 25 MG 24 hr tablet TAKE 1 TABLET(25 MG) BY MOUTH DAILY  . montelukast (SINGULAIR) 10 MG tablet TAKE 1 TABLET(10 MG) BY MOUTH AT BEDTIME  . Multiple Vitamin (MULTIVITAMIN WITH MINERALS) TABS tablet Take 1 tablet by mouth daily.  Marland Kitchen oxybutynin (DITROPAN) 5 MG tablet Take 0.5 tablets (2.5 mg total) by mouth 2 (two) times daily.  . pantoprazole (PROTONIX)  40 MG tablet TAKE 1 TABLET(40 MG) BY MOUTH DAILY  . potassium chloride SA (K-DUR,KLOR-CON) 20 MEQ tablet TAKE 1 TABLET BY MOUTH EVERY DAY  . rosuvastatin (CRESTOR) 20 MG tablet Take one tablet by mouth every Monday, Wednesday, Friday and Sunday  . UNABLE TO FIND Under pads use as needed  Pullups use as needed   Length of need- 99 months DX urinary incontinence  No facility-administered encounter medications on file as of 02/13/2019.     Activities of Daily Living In your present state of health, do you have any difficulty performing the following activities: 02/13/2019  Hearing? N  Vision? N  Difficulty concentrating or making decisions? N  Dressing or bathing? Y  Doing errands, shopping? Y  Preparing Food and eating ? Y  Using the Toilet? N  In the past six months, have you accidently leaked urine? N  Do you have problems with loss of bowel control? N  Managing your Medications? Y  Managing your Finances? Y  Housekeeping or managing your Housekeeping? Y  Some recent data might be hidden    Patient Care Team: Fayrene Helper, MD as PCP - [facility], Alphonse Guild, MD as PCP - Cardiology (Cardiology) Unice Bailey, MD (Internal Medicine) Gala Romney Cristopher Estimable, MD as Consulting Physician (Gastroenterology) Harl Bowie Alphonse Guild, MD as Consulting Physician (Cardiology)    Assessment:   This is a routine wellness examination for Danielle Cruz.  Exercise Activities and Dietary recommendations Exercise limited by: orthopedic condition(s);neurologic condition(s)  Goals    . Increase physical activity    . LIFESTYLE - DECREASE FALLS RISK       Fall Risk Fall Risk  02/13/2019 01/22/2019 09/26/2018 06/06/2018 05/24/2018  Falls in the past year? 0 Exclusion - non ambulatory 0 0 0  Number falls in past yr: 0 - 0 0 -  Injury with Fall? 0 - 0 0 0  Risk for fall due to : Impaired balance/gait;Impaired mobility - - - -  Follow up Falls evaluation completed;Education provided;Follow up  appointment - - - -   Is the patient's home free of loose throw rugs in walkways, pet beds, electrical cords, etc?   yes      Grab bars in the bathroom? yes      Handrails on the stairs?   yes      Adequate lighting?   yes   Depression Screen PHQ 2/9 Scores 02/13/2019 01/22/2019 06/06/2018 05/24/2018  PHQ - 2 Score 0 0 2 1  PHQ- 9 Score - - 2 -     Cognitive Function     6CIT Screen 03/21/2017  What Year? 0 points  What month? 0 points  What time? 0 points  Count back from 20 0 points  Months in reverse 0 points  Repeat phrase 0 points  Total Score 0    Immunization History  Administered Date(s) Administered  . Influenza Whole 01/01/2010, 03/09/2010, 01/11/2011  . Influenza,inj,Quad PF,6+ Mos 02/09/2013, 05/09/2014, 05/07/2016, 01/31/2017, 01/25/2018, 01/22/2019  . Pneumococcal Conjugate-13 11/28/2013  . Pneumococcal Polysaccharide-23 01/22/2019  . Tdap 01/11/2011    Qualifies for Shingles Vaccine? Checking insurance  Screening Tests Health Maintenance  Topic Date Due  . MAMMOGRAM  02/13/2020 (Originally 02/05/2017)  . PAP SMEAR-Modifier  05/05/2019  . TETANUS/TDAP  01/10/2021  . COLONOSCOPY  01/13/2028  . INFLUENZA VACCINE  Completed  . Hepatitis C Screening  Completed  . HIV Screening  Completed    Cancer Screenings: Lung: Low Dose CT Chest recommended if Age 26-80 years, 30 pack-year currently smoking OR have quit w/in 15years. Patient does qualify. Breast:  Up to date on Mammogram? No  Unable to stand Up to date of Bone Density/Dexa? No Colorectal: Due 2029    Additional Screenings:  Hepatitis C Screening: not completed yet      Plan:      1. Encounter for Medicare annual wellness exam   I have personally reviewed and  noted the following in the patient's chart:   . Medical and social history . Use of alcohol, tobacco or illicit drugs  . Current medications and supplements . Functional ability and status . Nutritional status . Physical activity  . Advanced directives . List of other physicians . Hospitalizations, surgeries, and ER visits in previous 12 months . Vitals . Screenings to include cognitive, depression, and falls . Referrals and appointments  In addition, I have reviewed and discussed with patient certain preventive protocols, quality metrics, and best practice recommendations. A written personalized care plan for preventive services as well as general preventive health recommendations were provided to patient.     I provided 20 minutes of non-face-to-face time during this encounter.  Perlie Mayo, NP  02/13/2019

## 2019-02-14 ENCOUNTER — Ambulatory Visit: Payer: Medicare Other

## 2019-02-19 ENCOUNTER — Other Ambulatory Visit: Payer: Self-pay

## 2019-02-19 MED ORDER — ROSUVASTATIN CALCIUM 20 MG PO TABS: ORAL_TABLET | ORAL | 6 refills | Status: DC

## 2019-02-19 NOTE — Telephone Encounter (Signed)
refilled crestor

## 2019-02-23 ENCOUNTER — Ambulatory Visit: Payer: Medicare Other

## 2019-03-12 DIAGNOSIS — Z79899 Other long term (current) drug therapy: Secondary | ICD-10-CM | POA: Diagnosis not present

## 2019-03-12 DIAGNOSIS — R748 Abnormal levels of other serum enzymes: Secondary | ICD-10-CM | POA: Diagnosis not present

## 2019-03-12 DIAGNOSIS — Z79891 Long term (current) use of opiate analgesic: Secondary | ICD-10-CM | POA: Diagnosis not present

## 2019-03-12 DIAGNOSIS — M2550 Pain in unspecified joint: Secondary | ICD-10-CM | POA: Diagnosis not present

## 2019-03-12 DIAGNOSIS — G8929 Other chronic pain: Secondary | ICD-10-CM | POA: Diagnosis not present

## 2019-03-12 DIAGNOSIS — M810 Age-related osteoporosis without current pathological fracture: Secondary | ICD-10-CM | POA: Diagnosis not present

## 2019-03-12 DIAGNOSIS — M0579 Rheumatoid arthritis with rheumatoid factor of multiple sites without organ or systems involvement: Secondary | ICD-10-CM | POA: Diagnosis not present

## 2019-03-12 DIAGNOSIS — M25579 Pain in unspecified ankle and joints of unspecified foot: Secondary | ICD-10-CM | POA: Diagnosis not present

## 2019-04-25 ENCOUNTER — Other Ambulatory Visit: Payer: Medicare Other

## 2019-04-30 ENCOUNTER — Other Ambulatory Visit: Payer: Self-pay

## 2019-04-30 MED ORDER — FUROSEMIDE 20 MG PO TABS: ORAL_TABLET | ORAL | 3 refills | Status: DC

## 2019-04-30 NOTE — Telephone Encounter (Signed)
Refilled lasix per fax request 

## 2019-05-01 ENCOUNTER — Other Ambulatory Visit: Payer: Self-pay

## 2019-05-01 ENCOUNTER — Ambulatory Visit: Payer: Medicare Other | Attending: Internal Medicine

## 2019-05-01 DIAGNOSIS — Z20822 Contact with and (suspected) exposure to covid-19: Secondary | ICD-10-CM

## 2019-05-02 LAB — NOVEL CORONAVIRUS, NAA: SARS-CoV-2, NAA: NOT DETECTED

## 2019-05-03 ENCOUNTER — Telehealth: Payer: Self-pay | Admitting: *Deleted

## 2019-05-03 NOTE — Telephone Encounter (Signed)
Patient calling for COVID lab results, results negative, verbalized understanding.

## 2019-05-09 ENCOUNTER — Other Ambulatory Visit: Payer: Self-pay | Admitting: Family Medicine

## 2019-05-18 IMAGING — CT CT HEAD W/O CM
4 series · 16 of 47 positions shown, 18 images · non-contrast
Comparison: Head CT without contrast 04/28/2009, brain MRI
03/28/2009.

CLINICAL DATA: 62-year-old female admitted after a fall. Increased
headache.

EXAM:
CT HEAD WITHOUT CONTRAST
TECHNIQUE: Contiguous axial images were obtained from the base of the skull
through the vertex without intravenous contrast.

[Series 3: head wo · axial · 0.43mm/px · z∈[-126,-6]mm · 7 of 32 slices shown, 9 images]
[im 4/32  brain]
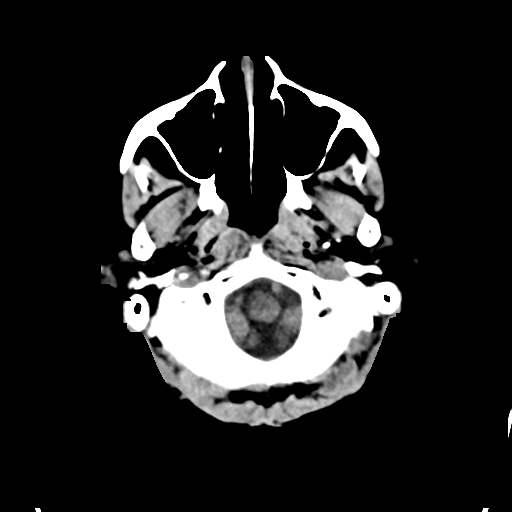
[im 4/32  bone]
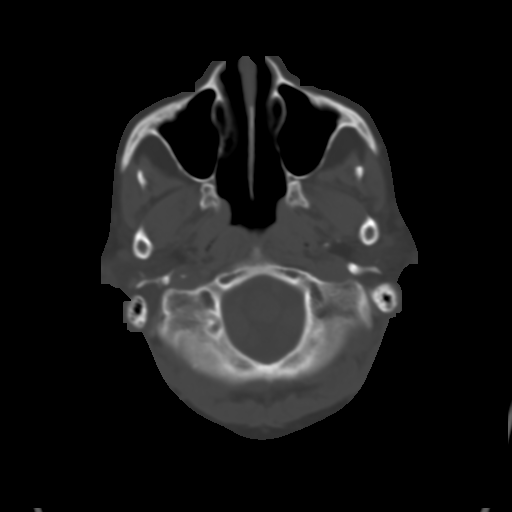
[im 8/32  brain]
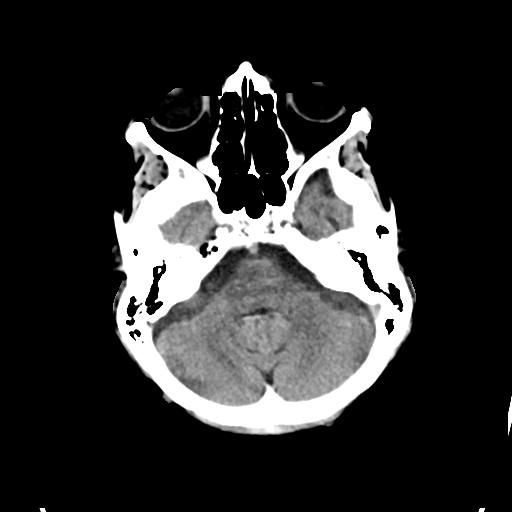
[im 12/32  brain]
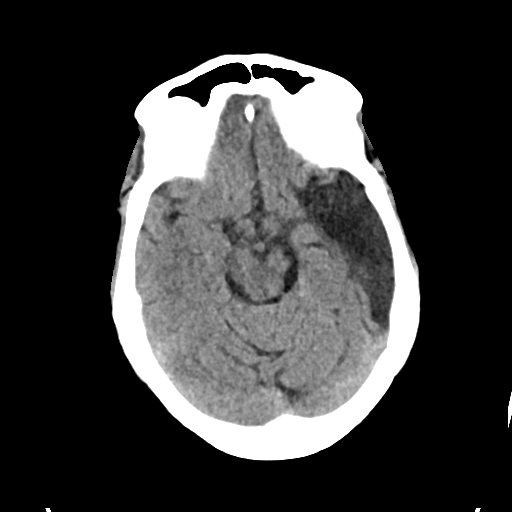
[im 16/32  brain]
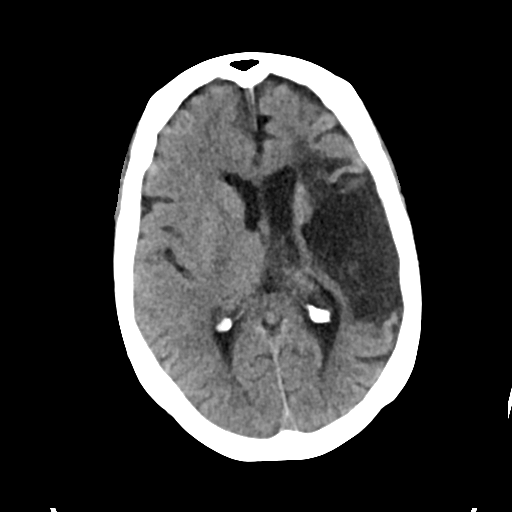
[im 20/32  brain]
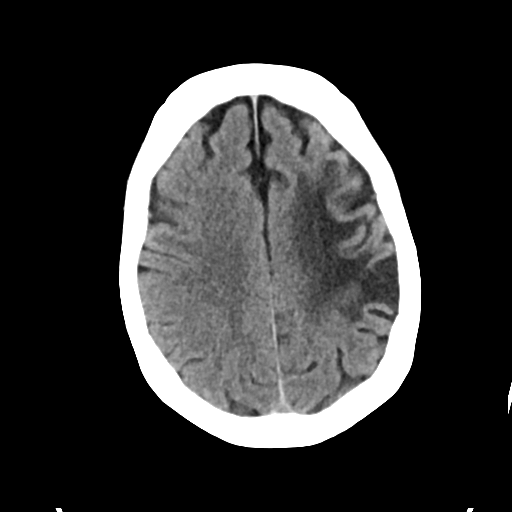
[im 20/32  bone]
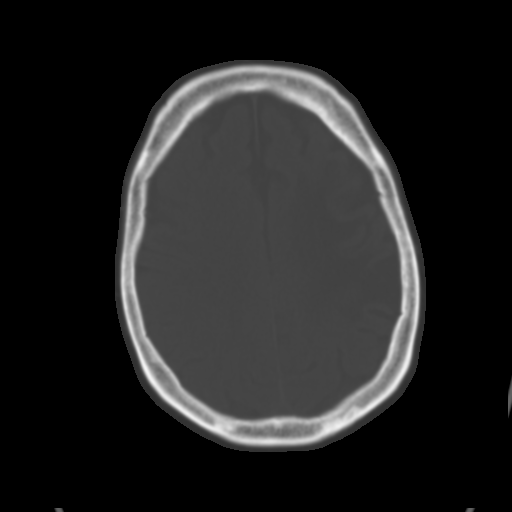
[im 24/32  brain]
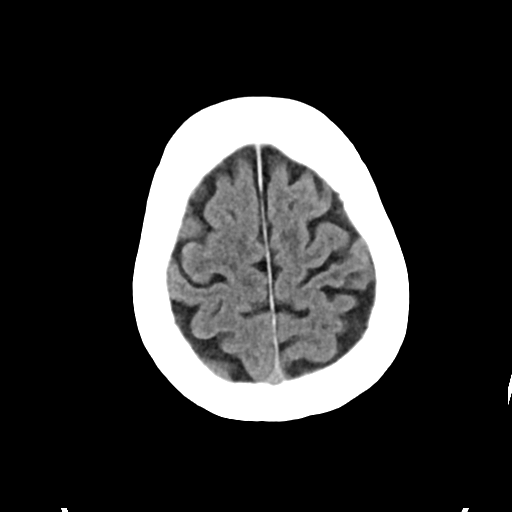
[im 28/32  brain]
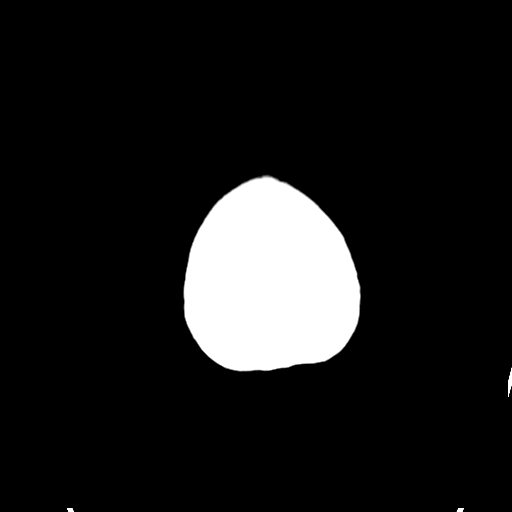

[Series 4: head bone · axial · 0.43mm/px · z∈[-127,-95]mm · 3 of 79 slices shown]
[im 8/79  bone]
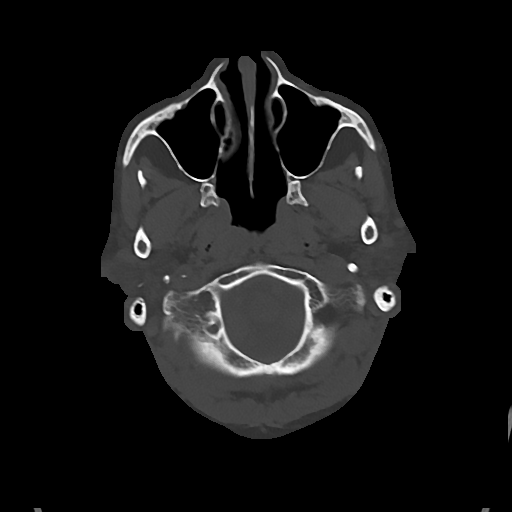
[im 16/79  bone]
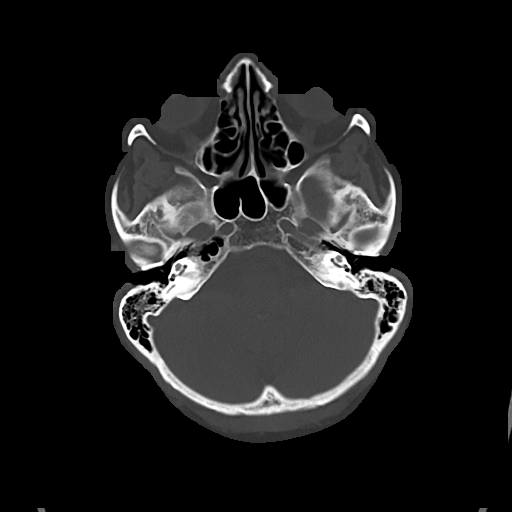
[im 24/79  bone]
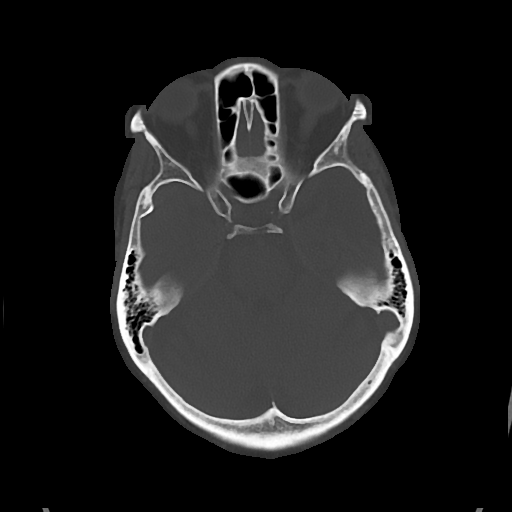

[Series 5: cor soft · coronal · 0.31mm/px · 3 of 66 slices shown]
[im 22/66  brain]
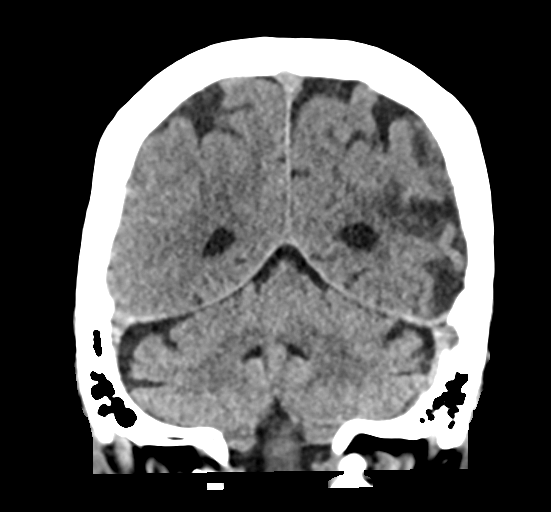
[im 29/66  brain]
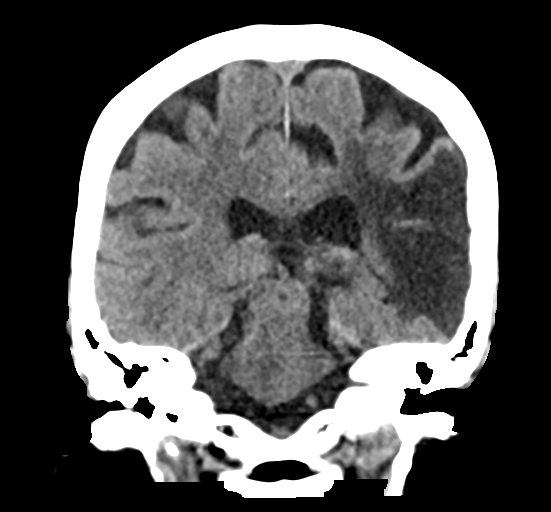
[im 37/66  brain]
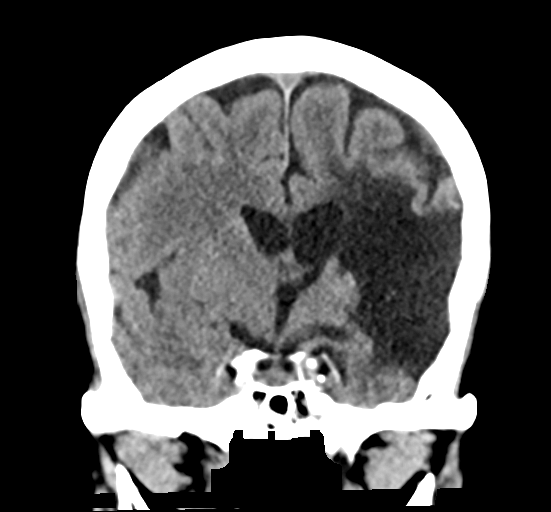

[Series 6: sag soft · sagittal · 0.31mm/px · 3 of 54 slices shown]
[im 18/54  brain]
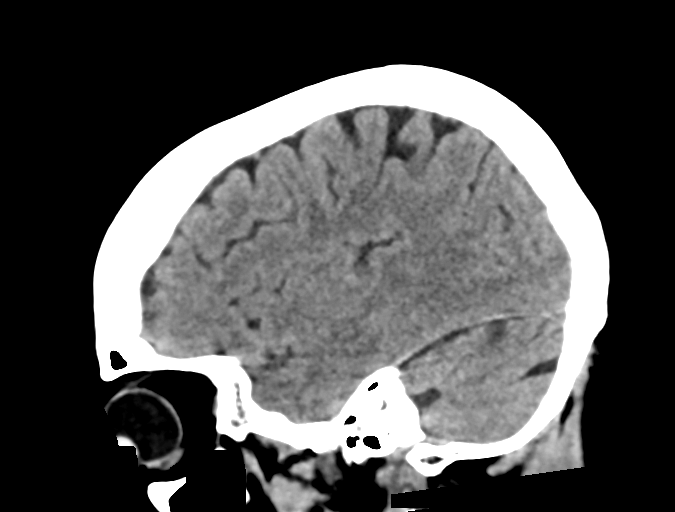
[im 27/54  brain]
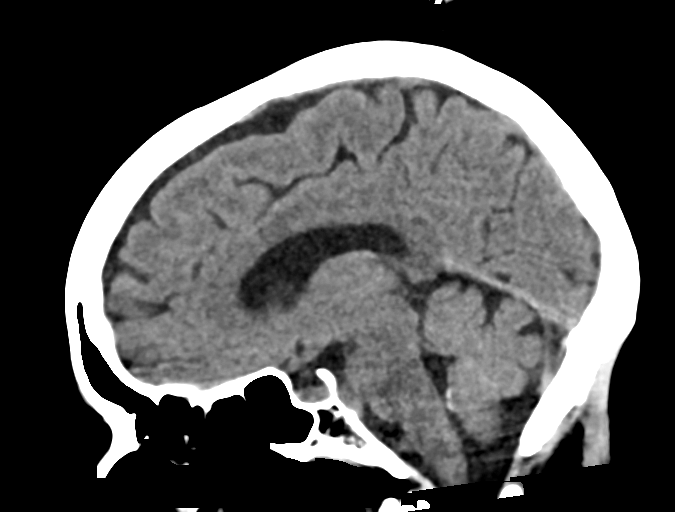
[im 36/54  brain]
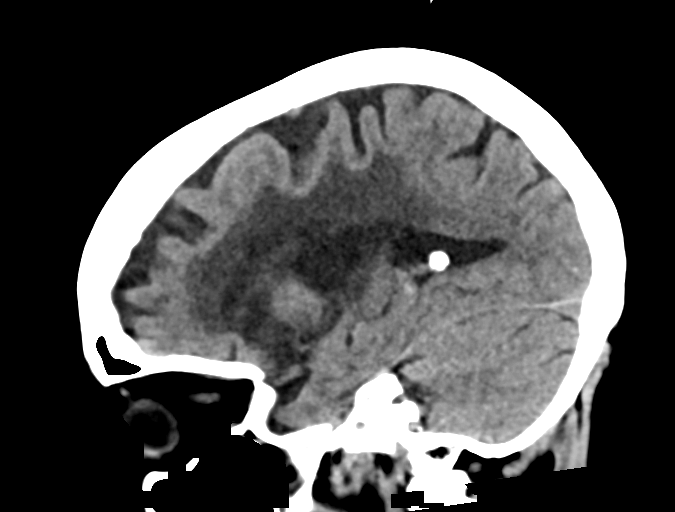

[16 of 47 positions shown; findings below may reference images not displayed]

FINDINGS: Brain: Chronic large left MCA territory infarct with
encephalomalacia and gliosis. There is a small new area of
encephalomalacia compared to the 0800 CT along the para landed
cortex seen on series 3, image 22 today. Increased regional white
matter gliosis at that level as well. However, these have a chronic
appearance.

No associated hemorrhage or mass effect. Stable mild ex vacuo
enlargement of the left lateral ventricle. No acute cortically based
infarct identified. Right hemisphere gray-white matter
differentiation remains normal.

Vascular: Calcified atherosclerosis at the skull base. No suspicious
intracranial vascular hyperdensity.

Skull: Stable.  No acute osseous abnormality identified.

Sinuses/Orbits: Visualized paranasal sinuses and mastoids are stable
and well pneumatized.

Other: Visualized orbits and scalp soft tissues are within normal
limits.
IMPRESSION: 1. Some progression of the chronic left MCA infarct since the 0800
CT, but with a chronic appearance.
2. No acute cortically based infarct, acute intracranial hemorrhage,
or other acute intracranial abnormality identified.
3. These results were communicated to Dr. DAVIS JIM at 9533
hours on 09/23/2017by text page via the [employer] messaging system.

## 2019-05-27 ENCOUNTER — Other Ambulatory Visit: Payer: Self-pay | Admitting: Family Medicine

## 2019-05-27 DIAGNOSIS — I10 Essential (primary) hypertension: Secondary | ICD-10-CM

## 2019-05-27 DIAGNOSIS — Z1231 Encounter for screening mammogram for malignant neoplasm of breast: Secondary | ICD-10-CM

## 2019-05-27 DIAGNOSIS — E785 Hyperlipidemia, unspecified: Secondary | ICD-10-CM

## 2019-05-28 IMAGING — CR DG CHEST 1V
1 series · 1 of 1 positions shown · non-contrast
Comparison: Radiographs 07/14/2017 and 12/16/2007.

CLINICAL DATA: Rectal bleeding. History of stroke and hypertension.

EXAM:
CHEST  1 VIEW

[ap portable]
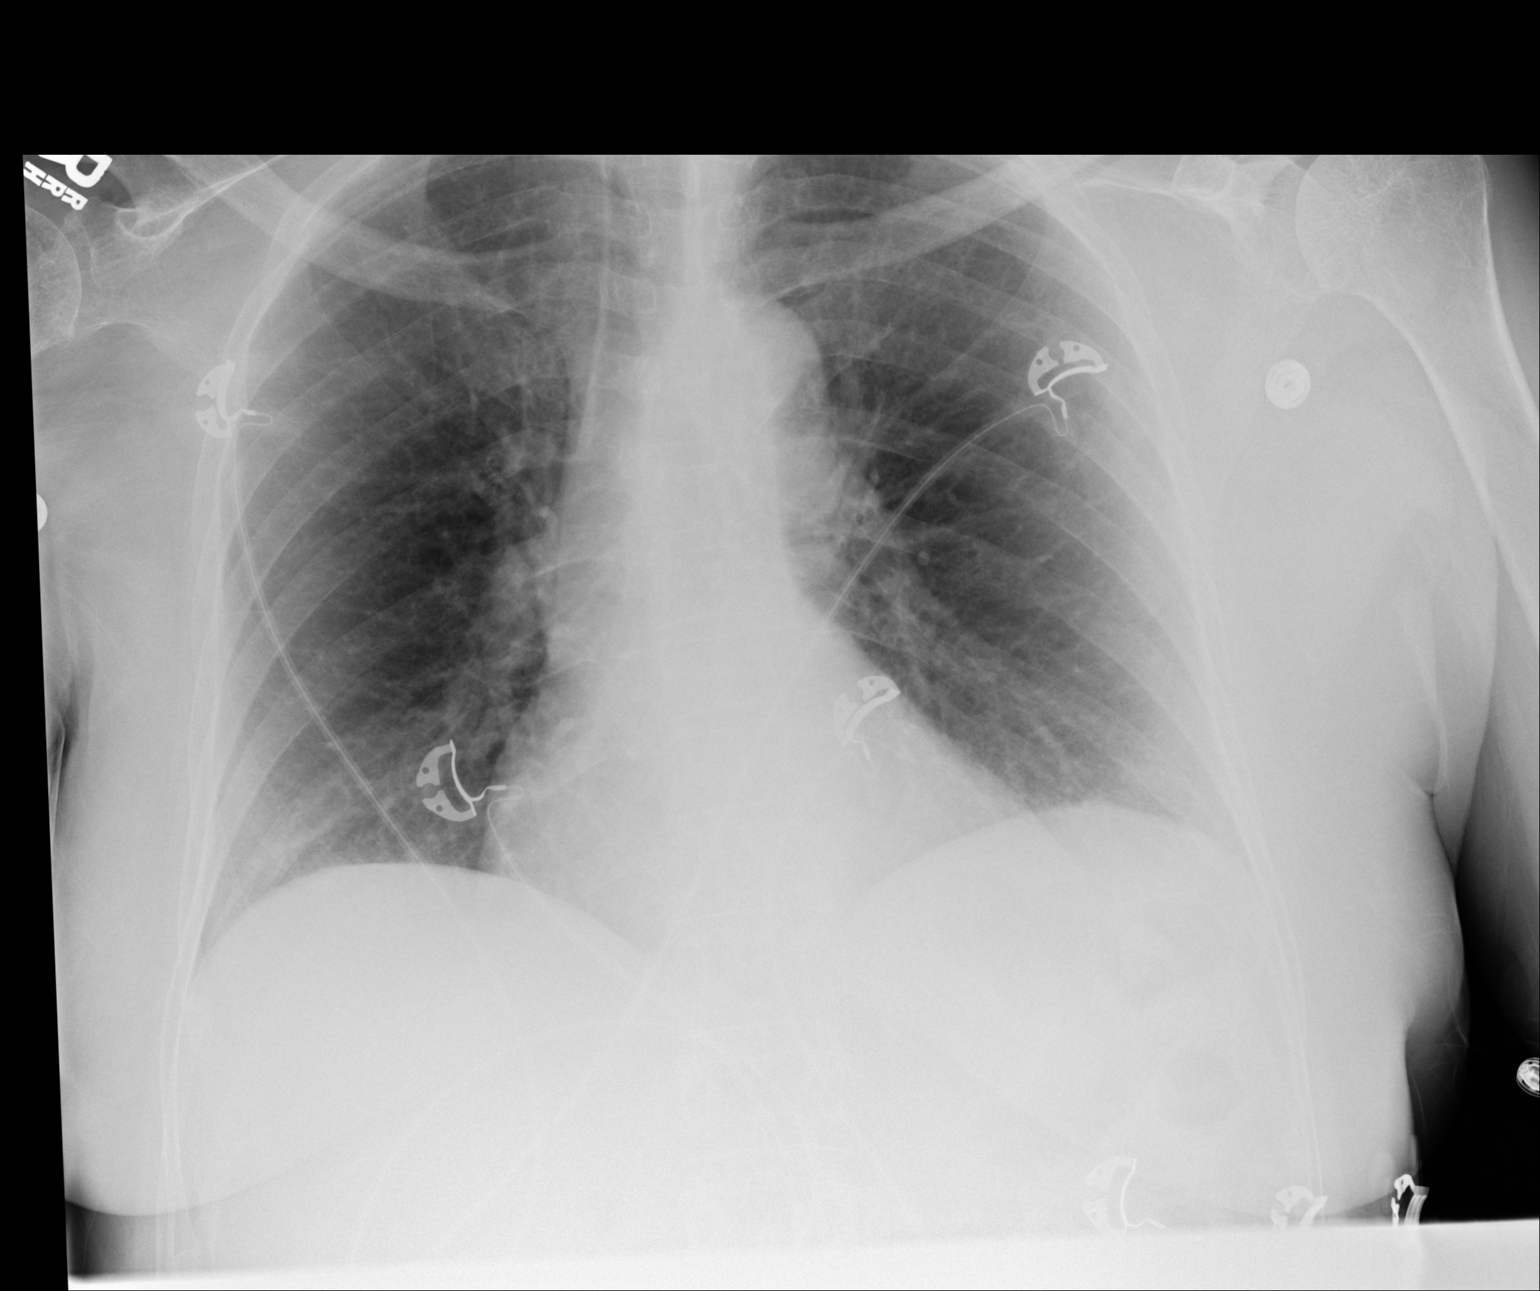

[1 of 1 positions shown; findings below may reference images not displayed]

FINDINGS: 4720 hours. There are lower lung volumes with minimal resulting
bibasilar atelectasis. No edema, confluent airspace opacity, pleural
effusion or pneumothorax. The heart size and mediastinal contours
are stable. No acute osseous findings are seen. Telemetry leads
overlie the chest.
IMPRESSION: No acute cardiopulmonary process.  Minimal bibasilar atelectasis.

## 2019-06-15 ENCOUNTER — Other Ambulatory Visit: Payer: Self-pay | Admitting: Cardiology

## 2019-06-18 ENCOUNTER — Other Ambulatory Visit: Payer: Self-pay | Admitting: *Deleted

## 2019-06-18 MED ORDER — PANTOPRAZOLE SODIUM 40 MG PO TBEC: DELAYED_RELEASE_TABLET | ORAL | 5 refills | Status: DC

## 2019-06-27 DIAGNOSIS — M810 Age-related osteoporosis without current pathological fracture: Secondary | ICD-10-CM | POA: Diagnosis not present

## 2019-06-27 DIAGNOSIS — M25579 Pain in unspecified ankle and joints of unspecified foot: Secondary | ICD-10-CM | POA: Diagnosis not present

## 2019-06-27 DIAGNOSIS — G8929 Other chronic pain: Secondary | ICD-10-CM | POA: Diagnosis not present

## 2019-06-27 DIAGNOSIS — M0579 Rheumatoid arthritis with rheumatoid factor of multiple sites without organ or systems involvement: Secondary | ICD-10-CM | POA: Diagnosis not present

## 2019-06-27 DIAGNOSIS — Z79891 Long term (current) use of opiate analgesic: Secondary | ICD-10-CM | POA: Diagnosis not present

## 2019-06-27 DIAGNOSIS — R748 Abnormal levels of other serum enzymes: Secondary | ICD-10-CM | POA: Diagnosis not present

## 2019-06-27 DIAGNOSIS — M2550 Pain in unspecified joint: Secondary | ICD-10-CM | POA: Diagnosis not present

## 2019-06-27 DIAGNOSIS — Z79899 Other long term (current) drug therapy: Secondary | ICD-10-CM | POA: Diagnosis not present

## 2019-07-05 ENCOUNTER — Telehealth: Payer: Self-pay

## 2019-07-05 NOTE — Telephone Encounter (Signed)
Yes she can.

## 2019-07-05 NOTE — Telephone Encounter (Signed)
If Pts Liver enzymes are up, can she still take the Covid shot

## 2019-07-05 NOTE — Telephone Encounter (Signed)
Called to Advise that it is ok.

## 2019-07-09 ENCOUNTER — Ambulatory Visit: Payer: Medicare Other | Admitting: Family Medicine

## 2019-07-19 DIAGNOSIS — R748 Abnormal levels of other serum enzymes: Secondary | ICD-10-CM | POA: Diagnosis not present

## 2019-07-19 DIAGNOSIS — R109 Unspecified abdominal pain: Secondary | ICD-10-CM | POA: Diagnosis not present

## 2019-08-06 ENCOUNTER — Other Ambulatory Visit: Payer: Self-pay | Admitting: Cardiology

## 2019-08-06 ENCOUNTER — Other Ambulatory Visit: Payer: Self-pay | Admitting: Family Medicine

## 2019-09-27 ENCOUNTER — Telehealth: Payer: Self-pay

## 2019-09-27 DIAGNOSIS — G8929 Other chronic pain: Secondary | ICD-10-CM | POA: Diagnosis not present

## 2019-09-27 DIAGNOSIS — Z79891 Long term (current) use of opiate analgesic: Secondary | ICD-10-CM | POA: Diagnosis not present

## 2019-09-27 DIAGNOSIS — K760 Fatty (change of) liver, not elsewhere classified: Secondary | ICD-10-CM | POA: Diagnosis not present

## 2019-09-27 DIAGNOSIS — M2550 Pain in unspecified joint: Secondary | ICD-10-CM | POA: Diagnosis not present

## 2019-09-27 DIAGNOSIS — M810 Age-related osteoporosis without current pathological fracture: Secondary | ICD-10-CM | POA: Diagnosis not present

## 2019-09-27 DIAGNOSIS — Z79899 Other long term (current) drug therapy: Secondary | ICD-10-CM | POA: Diagnosis not present

## 2019-09-27 DIAGNOSIS — R748 Abnormal levels of other serum enzymes: Secondary | ICD-10-CM | POA: Diagnosis not present

## 2019-09-27 DIAGNOSIS — M7989 Other specified soft tissue disorders: Secondary | ICD-10-CM | POA: Diagnosis not present

## 2019-09-27 DIAGNOSIS — M0579 Rheumatoid arthritis with rheumatoid factor of multiple sites without organ or systems involvement: Secondary | ICD-10-CM | POA: Diagnosis not present

## 2019-09-27 DIAGNOSIS — M25579 Pain in unspecified ankle and joints of unspecified foot: Secondary | ICD-10-CM | POA: Diagnosis not present

## 2019-09-27 NOTE — Telephone Encounter (Signed)
Per Husband Pt was seen by arthritis doctor today. Physician asked Pt to call Cardiologist for an appt for retaining fluid.   Please call husband [phone]   Thanks renee

## 2019-09-28 NOTE — Telephone Encounter (Signed)
Apt made for 6/18 with NP

## 2019-10-01 ENCOUNTER — Emergency Department (HOSPITAL_COMMUNITY): Payer: Medicare Other

## 2019-10-01 ENCOUNTER — Other Ambulatory Visit: Payer: Self-pay

## 2019-10-01 ENCOUNTER — Emergency Department (HOSPITAL_COMMUNITY)
Admission: EM | Admit: 2019-10-01 | Discharge: 2019-10-01 | Disposition: A | Discharge status: Home / Self Care | Payer: Medicare Other | Attending: Emergency Medicine | Admitting: Emergency Medicine

## 2019-10-01 ENCOUNTER — Encounter (HOSPITAL_COMMUNITY): Payer: Self-pay | Admitting: *Deleted

## 2019-10-01 ENCOUNTER — Telehealth: Payer: Self-pay | Admitting: Cardiology

## 2019-10-01 DIAGNOSIS — Z7982 Long term (current) use of aspirin: Secondary | ICD-10-CM | POA: Insufficient documentation

## 2019-10-01 DIAGNOSIS — R220 Localized swelling, mass and lump, head: Secondary | ICD-10-CM | POA: Diagnosis present

## 2019-10-01 DIAGNOSIS — R2231 Localized swelling, mass and lump, right upper limb: Secondary | ICD-10-CM | POA: Diagnosis not present

## 2019-10-01 DIAGNOSIS — Z79899 Other long term (current) drug therapy: Secondary | ICD-10-CM | POA: Diagnosis not present

## 2019-10-01 DIAGNOSIS — I509 Heart failure, unspecified: Secondary | ICD-10-CM | POA: Diagnosis not present

## 2019-10-01 DIAGNOSIS — F17210 Nicotine dependence, cigarettes, uncomplicated: Secondary | ICD-10-CM | POA: Insufficient documentation

## 2019-10-01 DIAGNOSIS — R2241 Localized swelling, mass and lump, right lower limb: Secondary | ICD-10-CM | POA: Diagnosis not present

## 2019-10-01 DIAGNOSIS — I2510 Atherosclerotic heart disease of native coronary artery without angina pectoris: Secondary | ICD-10-CM | POA: Insufficient documentation

## 2019-10-01 DIAGNOSIS — I110 Hypertensive heart disease with heart failure: Secondary | ICD-10-CM | POA: Insufficient documentation

## 2019-10-01 DIAGNOSIS — R531 Weakness: Secondary | ICD-10-CM | POA: Diagnosis not present

## 2019-10-01 DIAGNOSIS — M7989 Other specified soft tissue disorders: Secondary | ICD-10-CM

## 2019-10-01 LAB — CBC WITH DIFFERENTIAL/PLATELET
Abs Immature Granulocytes: 0.02 10*3/uL (ref 0.00–0.07)
Basophils Absolute: 0 10*3/uL (ref 0.0–0.1)
Basophils Relative: 0 %
Eosinophils Absolute: 0.1 10*3/uL (ref 0.0–0.5)
Eosinophils Relative: 2 %
HCT: 35.5 % — ABNORMAL LOW (ref 36.0–46.0)
Hemoglobin: 11.2 g/dL — ABNORMAL LOW (ref 12.0–15.0)
Immature Granulocytes: 0 %
Lymphocytes Relative: 38 %
Lymphs Abs: 2.6 10*3/uL (ref 0.7–4.0)
MCH: 32.9 pg (ref 26.0–34.0)
MCHC: 31.5 g/dL (ref 30.0–36.0)
MCV: 104.4 fL — ABNORMAL HIGH (ref 80.0–100.0)
Monocytes Absolute: 0.8 10*3/uL (ref 0.1–1.0)
Monocytes Relative: 12 %
Neutro Abs: 3.3 10*3/uL (ref 1.7–7.7)
Neutrophils Relative %: 48 %
Platelets: 151 10*3/uL (ref 150–400)
RBC: 3.4 MIL/uL — ABNORMAL LOW (ref 3.87–5.11)
RDW: 16.3 % — ABNORMAL HIGH (ref 11.5–15.5)
WBC: 6.9 10*3/uL (ref 4.0–10.5)
nRBC: 0 % (ref 0.0–0.2)

## 2019-10-01 LAB — COMPREHENSIVE METABOLIC PANEL
ALT: 55 U/L — ABNORMAL HIGH (ref 0–44)
AST: 62 U/L — ABNORMAL HIGH (ref 15–41)
Albumin: 3.3 g/dL — ABNORMAL LOW (ref 3.5–5.0)
Alkaline Phosphatase: 77 U/L (ref 38–126)
Anion gap: 8 (ref 5–15)
BUN: 10 mg/dL (ref 8–23)
CO2: 23 mmol/L (ref 22–32)
Calcium: 8.5 mg/dL — ABNORMAL LOW (ref 8.9–10.3)
Chloride: 106 mmol/L (ref 98–111)
Creatinine, Ser: 1.08 mg/dL — ABNORMAL HIGH (ref 0.44–1.00)
GFR calc Af Amer: >60 mL/min (ref >60)
GFR calc non Af Amer: 54 mL/min — ABNORMAL LOW (ref >60)
Glucose, Bld: 93 mg/dL (ref 70–99)
Potassium: 3.7 mmol/L (ref 3.5–5.1)
Sodium: 137 mmol/L (ref 135–145)
Total Bilirubin: 0.3 mg/dL (ref 0.3–1.2)
Total Protein: 7.5 g/dL (ref 6.5–8.1)

## 2019-10-01 LAB — TROPONIN I (HIGH SENSITIVITY): Troponin I (High Sensitivity): 8 ng/L (ref <18)

## 2019-10-01 LAB — BRAIN NATRIURETIC PEPTIDE: B Natriuretic Peptide: 80 pg/mL (ref 0.0–100.0)

## 2019-10-01 MED ORDER — FUROSEMIDE 10 MG/ML IJ SOLN
40.0000 mg | Freq: Once | INTRAMUSCULAR | Status: AC
  Administered 2019-10-01: 40 mg via INTRAVENOUS
  Filled 2019-10-01: qty 4

## 2019-10-01 NOTE — Telephone Encounter (Signed)
Pt currently being seen in the ER for swelling

## 2019-10-01 NOTE — ED Provider Notes (Signed)
[facility] EMERGENCY DEPARTMENT Provider Note   CSN: [id number] Arrival date & time: 10/01/19  1145     History Chief Complaint  Patient presents with  . Leg Swelling  . Facial Swelling    Danielle Cruz is a 64 y.o. female.  Patient has a history of congestive heart failure and also history of a stroke that left her right arm and right leg weakness.  She has more swelling in her legs and right arm.  She takes Lasix 20 mg a day  The history is provided by the patient and a friend. No language interpreter was used.  Weakness Severity:  Moderate Onset quality:  Gradual Timing:  Constant Progression:  Worsening Chronicity:  Recurrent Context: not alcohol use   Relieved by:  Nothing Worsened by:  Nothing Ineffective treatments:  None tried Associated symptoms: no abdominal pain, no chest pain, no cough, no diarrhea, no frequency, no headaches and no seizures   Risk factors: no anemia        Past Medical History:  Diagnosis Date  . AKI (acute kidney injury) ([place]) 09/22/2017  . Anemia   . 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 Arthritis   . Colonic polyp 2010   Hemorrhoids; h/o mild hematochezia  . CVA (cerebral infarction) 08/2008   sizable left -residual expressive aphasia, right sided weakness; ambulates with difficulty with the right leg brace   . Hyperlipidemia   . Hypertension 2005  . Rheumatoid arthritis(714.0)   . Stroke [facility])     Patient Active Problem List   Diagnosis Date Noted  . Diverticulosis of colon 02/04/2018  . Hemorrhoid 02/04/2018  . FH: breast cancer in relative when <24 years old 01/25/2018  . Hematochezia   . GI bleed 10/03/2017  . Hip fracture ([place]) 09/22/2017  . At high risk for falls 01/31/2017  . Disorder of bone and cartilage 05/11/2016  . Normocytic anemia 01/08/2015  . Elevated ferritin 01/08/2015  . CAD in native artery 12/07/2014  . Fasting hyperglycemia  11/05/2011  . Elevated transaminase level 01/08/2011  . Tobacco abuse   . Rheumatoid arthritis ([place])   . Colonic polyp   . 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 Cardiovascular disease 01/14/2010  . Anemia 04/01/2009  . Hyperlipemia 11/14/2008  . Essential hypertension 11/14/2008    Past Surgical History:  Procedure Laterality Date  . ANKLE SURGERY     Right  . CAROTID STENT INSERTION    . COLONOSCOPY W/ POLYPECTOMY  11/2008   Dr. Gala Romney. Left-sided diverticula, Pedunculated polyp snared (no adenomatous changes)  . COLONOSCOPY WITH PROPOFOL N/A 01/12/2018   Procedure: COLONOSCOPY WITH PROPOFOL;  Surgeon: Daneil Dolin, MD;  [facility];  Service: Endoscopy;  Laterality: N/A;  . FEMUR IM NAIL Right 09/23/2017   Procedure: INTRAMEDULLARY (IM) NAIL FEMORAL;  Surgeon: Renette Butters, MD;  Location: [place];  Service: Orthopedics;  Laterality: Right;  . FRACTURE SURGERY     Hip replacement in May 2019 - fall related   . OOPHORECTOMY       OB History   No obstetric history on file.     Family History  Problem Relation Age of Onset  . Cancer Father        prostate, deceased 7s  . Breast cancer Sister        Deceased 38s  . Heart attack Mother  deceased 34, during childbirth  . Cancer Brother        lymphoma  . Colon cancer Maternal Aunt        older than age 36  . Liver disease Neg Hx     Social History   Tobacco Use  . Smoking status: Current Every Day Smoker    Packs/day: 0.50    Years: 15.00    Pack years: 7.50    Types: Cigarettes  . Smokeless tobacco: Never Used  . Tobacco comment: quit after hospitized for hip fracture   Vaping Use  . Vaping Use: Never used  Substance Use Topics  . Alcohol use: No    Alcohol/week: 0.0 standard drinks    Comment: quit 6 years ago   . Drug use: No    Home Medications Prior to Admission medications   Medication Sig Start Date End Date Taking? Authorizing Provider    aspirin EC 81 MG tablet Take 81 mg by mouth daily.    [provider]  ezetimibe (ZETIA) 10 MG tablet Take 1 tablet (10 mg total) by mouth daily. 01/24/19   Fayrene Helper, MD  fluticasone (FLONASE) 50 MCG/ACT nasal spray SHAKE LIQUID AND USE 1 SPRAY IN EACH NOSTRIL DAILY 08/23/18   Fayrene Helper, MD  furosemide (LASIX) 20 MG tablet Take 20 mg daily as needed for swelling 04/30/19   Arnoldo Lenis, MD  HYDROcodone-acetaminophen (NORCO/VICODIN) 5-325 MG tablet Take 1 tablet by mouth daily as needed. 12/16/17   [provider]  methotrexate (RHEUMATREX) 2.5 MG tablet Take by mouth once a week. Spouse unsure of dose 05/23/18   [provider]  metoprolol succinate (TOPROL-XL) 25 MG 24 hr tablet TAKE 1 TABLET(25 MG) BY MOUTH DAILY 06/18/19   Arnoldo Lenis, MD  montelukast (SINGULAIR) 10 MG tablet TAKE 1 TABLET(10 MG) BY MOUTH AT BEDTIME 05/28/19   Fayrene Helper, MD  Multiple Vitamin (MULTIVITAMIN WITH MINERALS) TABS tablet Take 1 tablet by mouth daily.    [provider]  oxybutynin (DITROPAN) 5 MG tablet TAKE 1/2 TABLET(2.5 MG) BY MOUTH TWICE DAILY 08/06/19   Fayrene Helper, MD  pantoprazole (PROTONIX) 40 MG tablet TAKE 1 TABLET(40 MG) BY MOUTH DAILY 06/18/19   Fayrene Helper, MD  potassium chloride SA (KLOR-CON) 20 MEQ tablet TAKE 1 TABLET BY MOUTH EVERY DAY 08/06/19   Arnoldo Lenis, MD  rosuvastatin (CRESTOR) 20 MG tablet Take one tablet by mouth every Monday, Wednesday, Friday and Sunday 02/19/19   Arnoldo Lenis, MD  UNABLE TO FIND Under pads use as needed  Pullups use as needed   Length of need- 99 months DX urinary incontinence 01/02/19   Fayrene Helper, MD    Allergies    Ace inhibitors  Review of Systems   Review of Systems  Constitutional: Negative for appetite change and fatigue.  HENT: Negative for congestion, ear discharge and sinus pressure.   Eyes: Negative for discharge.  Respiratory: Negative for cough.    Cardiovascular: Negative for chest pain.  Gastrointestinal: Negative for abdominal pain and diarrhea.  Genitourinary: Negative for frequency and hematuria.  Musculoskeletal: Negative for back pain.       Swelling in right upper extremity and both lower extremities  Skin: Negative for rash.  Neurological: Positive for weakness. Negative for seizures and headaches.  Psychiatric/Behavioral: Negative for hallucinations.    Physical Exam Updated Vital Signs BP (!) 137/104   Pulse 60   Temp (!) 97.5 F (36.4 C) (Oral)  Resp (!) 21   SpO2 100%   Physical Exam Vitals and nursing note reviewed.  Constitutional:      Appearance: She is well-developed.  HENT:     Head: Normocephalic.     Nose: Nose normal.  Eyes:     General: No scleral icterus.    Conjunctiva/sclera: Conjunctivae normal.  Neck:     Thyroid: No thyromegaly.  Cardiovascular:     Rate and Rhythm: Normal rate and regular rhythm.     Heart sounds: No murmur heard.  No friction rub. No gallop.   Pulmonary:     Breath sounds: No stridor. No wheezing or rales.  Chest:     Chest wall: No tenderness.  Abdominal:     General: There is no distension.     Tenderness: There is no abdominal tenderness. There is no rebound.  Musculoskeletal:        General: Normal range of motion.     Cervical back: Neck supple.     Comments: Significant edema in both lower legs and right arm.  Lymphadenopathy:     Cervical: No cervical adenopathy.  Skin:    Findings: No erythema or rash.  Neurological:     Mental Status: She is alert and oriented to person, place, and time.     Motor: No abnormal muscle tone.     Coordination: Coordination normal.     Comments: Patient cannot move right arm or right leg from old stroke  Psychiatric:        Behavior: Behavior normal.     ED Results / Procedures / Treatments   Labs (all labs ordered are listed, but only abnormal results are displayed) Labs Reviewed  CBC WITH  DIFFERENTIAL/PLATELET - Abnormal; Notable for the following components:      Result Value   RBC 3.40 (*)    Hemoglobin 11.2 (*)    HCT 35.5 (*)    MCV 104.4 (*)    RDW 16.3 (*)    All other components within normal limits  COMPREHENSIVE METABOLIC PANEL - Abnormal; Notable for the following components:   Creatinine, Ser 1.08 (*)    Calcium 8.5 (*)    Albumin 3.3 (*)    AST 62 (*)    ALT 55 (*)    GFR calc non Af Amer 54 (*)    All other components within normal limits  BRAIN NATRIURETIC PEPTIDE  TROPONIN I (HIGH SENSITIVITY)    EKG None  Radiology US Venous Img Upper Uni Right(DVT)  Result Date: 10/01/2019 CLINICAL DATA:  Right upper extremity hand and forearm swelling for 3 weeks. EXAM: RIGHT UPPER EXTREMITY VENOUS DOPPLER ULTRASOUND TECHNIQUE: Gray-scale sonography with graded compression, as well as color Doppler and duplex ultrasound were performed to evaluate the upper extremity deep venous system from the level of the subclavian vein and including the jugular, axillary, basilic, radial, ulnar and upper cephalic vein. Spectral Doppler was utilized to evaluate flow at rest and with distal augmentation maneuvers. COMPARISON:  None. FINDINGS: Some technical limitations due to difficulty patient positioning related to prior stroke. Contralateral Subclavian Vein: Respiratory phasicity is normal and symmetric with the symptomatic side. No evidence of thrombus. Normal compressibility. Internal Jugular Vein: No evidence of thrombus. Normal compressibility, respiratory phasicity and response to augmentation. Subclavian Vein: No evidence of thrombus. Normal compressibility, respiratory phasicity and response to augmentation. Axillary Vein: Not well visualized due to limitations with positioning, however no findings suspicious for thrombus. Cephalic Vein: No evidence of thrombus. Normal compressibility, respiratory phasicity  and response to augmentation. Basilic Vein: No evidence of thrombus.  Normal compressibility, respiratory phasicity and response to augmentation. Brachial Veins: No evidence of thrombus. Normal compressibility, respiratory phasicity and response to augmentation. Radial Veins: No evidence of thrombus. Normal compressibility, respiratory phasicity and response to augmentation. Ulnar Veins: No evidence of thrombus. Normal compressibility, respiratory phasicity and response to augmentation. Venous Reflux:  None visualized. Other Findings:  Subcutaneous edema throughout the right forearm. IMPRESSION: 1. No evidence of right upper extremity DVT. 2. Nonspecific soft tissue edema in the forearm. Electronically Signed   By: Keith Rake M.D.   On: 10/01/2019 17:06   DG Chest Port 1 View  Result Date: 10/01/2019 CLINICAL DATA:  Shortness of breath swelling. EXAM: PORTABLE CHEST 1 VIEW COMPARISON:  One-view chest x-ray 05/05/2017 FINDINGS: The heart size is normal. Atherosclerotic changes are present at the aortic arch. Mild edema is present. No significant airspace consolidation is present. Small effusions are suspected. IMPRESSION: Mild edema and small bilateral pleural effusions compatible with congestive heart failure. Electronically Signed   By: San Morelle M.D.   On: 10/01/2019 15:39    Procedures Procedures (including critical care time)  Medications Ordered in ED Medications  furosemide (LASIX) injection 40 mg (has no administration in time range)    ED Course  I have reviewed the triage vital signs and the nursing notes.  Pertinent labs & imaging results that were available during my care of the patient were reviewed by me and considered in my medical decision making (see chart for details).    MDM Rules/Calculators/A&P                          Patient with worsening heart failure.  We will increase her Lasix and give her 40 mg IV now and she is to follow-up with her primary doctor tomorrow and cardiologist later this week      This patient  presents to the ED for concern of swelling this involves an extensive number of treatment options, and is a complaint that carries with it a high risk of complications and morbidity.  The differential diagnosis includes heart failure   Lab Tests:   I Ordered, reviewed, and interpreted labs, which included CBC and chemistries that showed mild anemia  Medicines ordered:   I ordered medication Lasix for edema  Imaging Studies ordered:   I ordered imaging studies which included chest x-ray and ultrasound right upper extremity and  I independently visualized and interpreted imaging which showed no DVT in right upper extremity and chest x-ray shows mild congestive heart failure  Additional history obtained:   Additional history obtained from husband  Previous records obtained and reviewed   Consultations Obtained:     Reevaluation:  After the interventions stated above, I reevaluated the patient and found mild improvement  Critical Interventions:  .   Final Clinical Impression(s) / ED Diagnoses Final diagnoses:  Leg swelling    Rx / DC Orders ED Discharge Orders    None       Milton Ferguson, MD 10/01/19 1805

## 2019-10-01 NOTE — Telephone Encounter (Signed)
Pt. Husband says that his wife has swollen up. He says her feet, hands and face are swollen. He wants to know if she can be seen ASAP. She does have an appt on on Friday with Mitzi Hansen

## 2019-10-01 NOTE — ED Notes (Signed)
Pt refused labdraw

## 2019-10-01 NOTE — ED Triage Notes (Signed)
Spouse states she had facial, torso and leg swelling onset over 2 weeks ago

## 2019-10-01 NOTE — Discharge Instructions (Addendum)
Increase Lasix so you are taking 40 mg a day

## 2019-10-02 ENCOUNTER — Telehealth: Payer: Self-pay

## 2019-10-02 ENCOUNTER — Encounter: Payer: Self-pay | Admitting: Family Medicine

## 2019-10-02 ENCOUNTER — Other Ambulatory Visit: Payer: Self-pay

## 2019-10-02 ENCOUNTER — Ambulatory Visit (INDEPENDENT_AMBULATORY_CARE_PROVIDER_SITE_OTHER): Payer: Medicare Other | Admitting: Family Medicine

## 2019-10-02 VITALS — BP 152/76 | HR 76 | Temp 98.4°F | Resp 15 | Ht 67.0 in | Wt 185.1 lb

## 2019-10-02 DIAGNOSIS — I509 Heart failure, unspecified: Secondary | ICD-10-CM

## 2019-10-02 DIAGNOSIS — I10 Essential (primary) hypertension: Secondary | ICD-10-CM | POA: Diagnosis not present

## 2019-10-02 DIAGNOSIS — F17210 Nicotine dependence, cigarettes, uncomplicated: Secondary | ICD-10-CM

## 2019-10-02 DIAGNOSIS — I5032 Chronic diastolic (congestive) heart failure: Secondary | ICD-10-CM | POA: Insufficient documentation

## 2019-10-02 DIAGNOSIS — Z09 Encounter for follow-up examination after completed treatment for conditions other than malignant neoplasm: Secondary | ICD-10-CM | POA: Diagnosis not present

## 2019-10-02 DIAGNOSIS — R32 Unspecified urinary incontinence: Secondary | ICD-10-CM | POA: Insufficient documentation

## 2019-10-02 DIAGNOSIS — I2510 Atherosclerotic heart disease of native coronary artery without angina pectoris: Secondary | ICD-10-CM | POA: Diagnosis not present

## 2019-10-02 DIAGNOSIS — Z720 Tobacco use: Secondary | ICD-10-CM

## 2019-10-02 DIAGNOSIS — I69051 Hemiplegia and hemiparesis following nontraumatic subarachnoid hemorrhage affecting right dominant side: Secondary | ICD-10-CM | POA: Diagnosis not present

## 2019-10-02 HISTORY — DX: Heart failure, unspecified: I50.9

## 2019-10-02 MED ORDER — NICOTINE 14 MG/24HR TD PT24: 14.0000 mg | MEDICATED_PATCH | Freq: Every day | TRANSDERMAL | 1 refills | Status: DC

## 2019-10-02 MED ORDER — POTASSIUM CHLORIDE CRYS ER 20 MEQ PO TBCR
20.0000 meq | EXTENDED_RELEASE_TABLET | Freq: Three times a day (TID) | ORAL | 0 refills | Status: DC
Start: 2019-10-02 — End: 2019-10-23

## 2019-10-02 MED ORDER — FUROSEMIDE 40 MG PO TABS
40.0000 mg | ORAL_TABLET | Freq: Two times a day (BID) | ORAL | 0 refills | Status: DC
Start: 2019-10-02 — End: 2019-10-23

## 2019-10-02 MED ORDER — UNABLE TO FIND: 11 refills | Status: AC

## 2019-10-02 NOTE — Telephone Encounter (Signed)
Noted will address

## 2019-10-02 NOTE — Telephone Encounter (Signed)
Danielle Cruz is calling to see if Dr Moshe Cipro can write a prescription for Pull Ups & Under pads and give to the pt, when she comes in today

## 2019-10-02 NOTE — Patient Instructions (Addendum)
F/u with MD in 6 weeks, pap at that visit, call if you need me sooner  New HIGHER dose of potassium and furosemide at least until you see your Cardiologist this week. I prescribed thi you s dose for only 10 days, cardiology will take over this Fridayplease take one exrtra 20 mg furosemide and 2 extra potassium tablets today, and this evening take one 40 mg  Furosemide tablet  Prescription for incontinence pads  and briefs due to incontinence will be left at front desk for pick up , per your request  Plan to STOP smoking, and instead use the patches prescribed , apply one daily, 2 months are prescribed. You CANNOT smoke and use the patch, ow smoking 1 PPD , bad for your heart and lungs  Be careful not to fall  Thanks for choosing [facility], we consider it a privelige to serve you.

## 2019-10-02 NOTE — Assessment & Plan Note (Signed)
Wets on herself maily at night and also in the day also has limited mobility so may have some accidents, needs chucks and  briefs

## 2019-10-02 NOTE — Assessment & Plan Note (Addendum)
Asked:confirms currently smokes cigarettes, 1 PPD Assess: Unwilling to quit but cutting back Advise: needs to QUIT to reduce risk of cancer, cardio and cerebrovascular disease Assist: counseled for 5 minutes and literature provided Arrange: follow up in 3 months RX for nicoderm patch presented, advised no smoking when using patch

## 2019-10-05 ENCOUNTER — Encounter: Payer: Self-pay | Admitting: Family Medicine

## 2019-10-05 ENCOUNTER — Ambulatory Visit (INDEPENDENT_AMBULATORY_CARE_PROVIDER_SITE_OTHER): Payer: Medicare Other | Admitting: Family Medicine

## 2019-10-05 ENCOUNTER — Other Ambulatory Visit: Payer: Self-pay

## 2019-10-05 VITALS — BP 144/76 | HR 84 | Ht 67.0 in | Wt 185.0 lb

## 2019-10-05 DIAGNOSIS — R601 Generalized edema: Secondary | ICD-10-CM

## 2019-10-05 DIAGNOSIS — Z09 Encounter for follow-up examination after completed treatment for conditions other than malignant neoplasm: Secondary | ICD-10-CM | POA: Insufficient documentation

## 2019-10-05 DIAGNOSIS — I10 Essential (primary) hypertension: Secondary | ICD-10-CM | POA: Diagnosis not present

## 2019-10-05 DIAGNOSIS — E782 Mixed hyperlipidemia: Secondary | ICD-10-CM | POA: Diagnosis not present

## 2019-10-05 DIAGNOSIS — R600 Localized edema: Secondary | ICD-10-CM | POA: Diagnosis not present

## 2019-10-05 DIAGNOSIS — I509 Heart failure, unspecified: Secondary | ICD-10-CM | POA: Diagnosis not present

## 2019-10-05 DIAGNOSIS — R32 Unspecified urinary incontinence: Secondary | ICD-10-CM | POA: Insufficient documentation

## 2019-10-05 DIAGNOSIS — Z8673 Personal history of transient ischemic attack (TIA), and cerebral infarction without residual deficits: Secondary | ICD-10-CM | POA: Diagnosis not present

## 2019-10-05 DIAGNOSIS — I2510 Atherosclerotic heart disease of native coronary artery without angina pectoris: Secondary | ICD-10-CM | POA: Diagnosis not present

## 2019-10-05 NOTE — Assessment & Plan Note (Signed)
Currently decompensated with generalized edema affecting trunk, upper and lower extremities. Incrase dose of lasix and potassium x 10 days, Cardiology to evaluate in next 3 to 4 days and take over management. Limitation of sodium and fluid intake discussed and encouraged

## 2019-10-05 NOTE — Progress Notes (Signed)
   Danielle Cruz     MRN: [id number]      DOB: [birth year]   HPI Danielle Cruz is here for follow up of recent ED evaluation 2 days ago, when she presented with swelling of legs, abdomen and upper extremities which has progressed in the last 3 weeks Spouse denies any PND orthopnea or complaint of chest pain or palpitations Still smoking , will try cutting back  C/o wetting on herself ,night worse than day, a lot of this is due to her limitation in mobility due to hemiparesis from a CVA, and requesting and requiring briefs and chucks to improve the  quality of her life    ROS Denies recent fever or chills. Denies sinus pressure, nasal congestion, ear pain or sore throat. Denies chest congestion, productive cough or wheezing.  Denies abdominal pain, nausea, vomiting,diarrhea or constipation.   C/o chronic  joint pain, swelling and limitation in mobility.  Denies depression, anxiety or insomnia. Denies skin break down or rash.   PE  BP (!) 152/76   Pulse 76   Temp 98.4 F (36.9 C) (Temporal)   Resp 15   Ht 5\' 7"  (1.702 m)   Wt 185 lb 1.9 oz (84 kg)   SpO2 98%   BMI 28.99 kg/m   Patient alert and oriented and in no cardiopulmonary distress.  HEENT: No facial asymmetry, EOMI,     Neck supple .  Chest: Clear to auscultation bilaterally.  CVS: S1, S2 no murmurs, no S3.Regular rate.  ABD: Soft non tender.   Ext: 3 plus pitting lower extremity edema, also edema of upper extremities, 2 plus  [id number]  ROM spine, shoulders, hips and knees.  Skin: Intact, no ulcerations or rash noted.  Psych: Good eye contact, normal affect.  not anxious or depressed appearing.  CNS: CN 2-12 intact, right hemiparesis  Assessment & Plan  Urinary incontinence in female Wets on herself maily at night and also in the day also has limited mobility so may have some accidents, needs chucks and  briefs  Tobacco abuse Asked:confirms currently smokes cigarettes, 1 PPD Assess: Unwilling to quit but  cutting back Advise: needs to QUIT to reduce risk of cancer, cardio and cerebrovascular disease Assist: counseled for 5 minutes and literature provided Arrange: follow up in 3 months RX for nicoderm patch presented, advised no smoking when using patch   CHF (congestive heart failure) ([place]) Currently decompensated with generalized edema affecting trunk, upper and lower extremities. Incrase dose of lasix and potassium x 10 days, Cardiology to evaluate in next 3 to 4 days and take over management. 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 Fall risk reduction discussed Need for incontinece pads and undergarments justified due to poor mobility  Incontinence in female Need for incontinence supplies including briefs and chucks established due to incontinence of urine primarily due to limited mobility. Prescriptiion for these will be provided  Encounter for examination following treatment at hospital ED evaluation on 10/01/2019 for generalized swelling Lab and radiologic data as well as plan for follow up and assessment are reviewed and discussed with the patient and her spouse. ll questions are answered. Has Cardiology evaluation later this week. Primary problem is decompensated heart failure

## 2019-10-05 NOTE — Assessment & Plan Note (Signed)
Need for incontinence supplies including briefs and chucks established due to incontinence of urine primarily due to limited mobility. Prescriptiion for these will be provided

## 2019-10-05 NOTE — Assessment & Plan Note (Signed)
Elevated at visit, likely  Fluid overload contributing, no change in antihypertensive meds at this visit

## 2019-10-05 NOTE — Assessment & Plan Note (Signed)
ED evaluation on 10/01/2019 for generalized swelling Lab and radiologic data as well as plan for follow up and assessment are reviewed and discussed with the patient and her spouse. ll questions are answered. Has Cardiology evaluation later this week. Primary problem is decompensated heart failure

## 2019-10-05 NOTE — Patient Instructions (Signed)
Medication Instructions:  Your physician recommends that you continue on your current medications as directed. Please refer to the Current Medication list given to you today.  *If you need a refill on your cardiac medications before your next appointment, please call your pharmacy*   Lab Work: BMET, Magnesium at next appointment  If you have labs (blood work) drawn today and your tests are completely normal, you will receive your results only by: Marland Kitchen MyChart Message (if you have MyChart) OR . A paper copy in the mail If you have any lab test that is abnormal or we need to change your treatment, we will call you to review the results.   Testing/Procedures: Your physician has requested that you have an echocardiogram. Echocardiography is a painless test that uses sound waves to create images of your heart. It provides your doctor with information about the size and shape of your heart and how well your heart's chambers and valves are working. This procedure takes approximately one hour. There are no restrictions for this procedure.     Follow-Up: At [facility], you and your health needs are our priority.  As part of our continuing mission to provide you with exceptional heart care, we have created designated Provider Care Teams.  These Care Teams include your primary Cardiologist (physician) and Advanced Practice Providers (APPs -  Physician Assistants and Nurse Practitioners) who all work together to provide you with the care you need, when you need it.  We recommend signing up for the patient portal called "MyChart".  Sign up information is provided on this After Visit Summary.  MyChart is used to connect with patients for Virtual Visits (Telemedicine).  Patients are able to view lab/test results, encounter notes, upcoming appointments, etc.  Non-urgent messages can be sent to your provider as well.   To learn more about what you can do with MyChart, go to [email or web address].     Your next appointment:   Follow Up after Echocardiogram  The format for your next appointment:   In Person  Provider:   You may see Carlyle Dolly, MD or one of the following Advanced Practice Providers on your designated Care Team:    Bernerd Pho, PA-C   Ermalinda Barrios, [place]     Other Instructions Continue taking Furosemide 40mg  twice daily until your next office visit.

## 2019-10-05 NOTE — Progress Notes (Signed)
Cardiology Office Note  Date: 10/05/2019   ID: Danielle Cruz, DOB 1956/04/09, MRN [id number]  PCP:  Fayrene Helper, MD  Cardiologist:  Carlyle Dolly, MD Electrophysiologist:  None   Chief Complaint: F/U CAD, history of CVA, HLD, HTN, lower extremity edema  History of Present Illness: Danielle Cruz is a 64 y.o. female with a history of CAD, CVA, HLD, HTN, lower extremity edema.Current smoker 1 ppd. Unwilling to quit but cutting back per PCP note.   Prior cardiac cath with stenting 2005 to LAD and OM. Regarding the residual disease in the apical LAD recommended to continue medical therapy. If recurrent CP refractory to medical therapy PCI to apical LAD could be considered. She had some non obstructive disease in small RCA. Echo 09/05/2008 EF 55 to 65% no WMA's. History of CVA with aphasia. Aspirin and statin for secondary prevention. LDL 11/05/2017 was 112. Labs followed by PCP. Compliant with statin. Lower extremity edema with echo 03/2016 EF 60 to 62%, grade 1 diastolic dysfunction. Prior admission 12/2017 had AKI. Diuretics were stopped. Overall venous and sufficiency suspected for cause of lower extremity edema.. She was taking Lasix 20 mg a day and intermittently using compression stockings.  Presented to [facility] ED 10/01/2019 with leg and facial swelling. ED provider described the patient as having worsening heart failure. Her BNP was 80. Hemoglobin 11.2 hematocrit 35.5, creatinine 1.08, GFR greater greater than 60. Troponin I was 8. Chest x-ray showed mild edema and small bilateral pleural effusions compatible with congestive heart failure. She was given 40 mg of IV Lasix. Patient's husband had called PCP on 10/01/2019 and stated his wife had swollen up and her feet, hands, and face were swollen.  She saw her primary care provider 10/02/2019 who instructed her to start taking Lasix 40 mg twice a day for 10 days.  Patient's husband who is speaking for her due to her inability to speak  secondary to her stroke states she denies any recent increase in dyspnea.  Denies any recent acute illnesses.  He states nothing has changed except for swelling in her face, arms, abdomen and legs.  Today she weighs 185.  Prior weight in October 2020 she weighed 169.  Blood pressure is 144/76 today.  Heart rate is 84.  Husband is asking about medication that may contribute to elevated liver enzymes.  Patient's husband states her liver enzymes were recently elevated at rheumatology office.  We do not have access to those results.  Husband states he will attempt to have the office send the lab work to [place].  Past Medical History:  Diagnosis Date  . AKI (acute kidney injury) ([place]) 09/22/2017  . Anemia   . 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 Arthritis   . CHF (congestive heart failure) ([place]) 10/02/2019  . Colonic polyp 2010   Hemorrhoids; h/o mild hematochezia  . CVA (cerebral infarction) 08/2008   sizable left -residual expressive aphasia, right sided weakness; ambulates with difficulty with the right leg brace   . Hyperlipidemia   . Hypertension 2005  . Rheumatoid arthritis(714.0)   . Stroke [facility])     Past Surgical History:  Procedure Laterality Date  . ANKLE SURGERY     Right  . CAROTID STENT INSERTION    . COLONOSCOPY W/ POLYPECTOMY  11/2008   Dr. Gala Romney. Left-sided diverticula, Pedunculated polyp snared (no adenomatous changes)  . COLONOSCOPY WITH PROPOFOL N/A  01/12/2018   Procedure: COLONOSCOPY WITH PROPOFOL;  Surgeon: Daneil Dolin, MD;  [facility];  Service: Endoscopy;  Laterality: N/A;  . FEMUR IM NAIL Right 09/23/2017   Procedure: INTRAMEDULLARY (IM) NAIL FEMORAL;  Surgeon: Renette Butters, MD;  Location: [facility];  Service: Orthopedics;  Laterality: Right;  . FRACTURE SURGERY     Hip replacement in May 2019 - fall related   . OOPHORECTOMY      Current Outpatient Medications  Medication Sig  Dispense Refill  . aspirin EC 81 MG tablet Take 81 mg by mouth daily.    Marland Kitchen ezetimibe (ZETIA) 10 MG tablet Take 1 tablet (10 mg total) by mouth daily. 90 tablet 3  . fluticasone (FLONASE) 50 MCG/ACT nasal spray SHAKE LIQUID AND USE 1 SPRAY IN EACH NOSTRIL DAILY 16 g 3  . furosemide (LASIX) 40 MG tablet Take 1 tablet (40 mg total) by mouth 2 (two) times daily for 10 days. 20 tablet 0  . HYDROcodone-acetaminophen (NORCO/VICODIN) 5-325 MG tablet Take 1 tablet by mouth daily as needed.  0  . hydroxychloroquine (PLAQUENIL) 200 MG tablet Take 400 mg by mouth daily.    . metoprolol succinate (TOPROL-XL) 25 MG 24 hr tablet TAKE 1 TABLET(25 MG) BY MOUTH DAILY 30 tablet 11  . montelukast (SINGULAIR) 10 MG tablet TAKE 1 TABLET(10 MG) BY MOUTH AT BEDTIME 30 tablet 4  . Multiple Vitamin (MULTIVITAMIN WITH MINERALS) TABS tablet Take 1 tablet by mouth daily.    . nicotine (NICODERM CQ - DOSED IN MG/24 HOURS) 14 mg/24hr patch Place 1 patch (14 mg total) onto the skin daily. 28 patch 1  . oxybutynin (DITROPAN) 5 MG tablet TAKE 1/2 TABLET(2.5 MG) BY MOUTH TWICE DAILY 90 tablet 3  . pantoprazole (PROTONIX) 40 MG tablet TAKE 1 TABLET(40 MG) BY MOUTH DAILY 30 tablet 5  . potassium chloride SA (KLOR-CON) 20 MEQ tablet Take 1 tablet (20 mEq total) by mouth 3 (three) times daily for 10 days. 30 tablet 0  . rosuvastatin (CRESTOR) 20 MG tablet Take one tablet by mouth every Monday, Wednesday, Friday and Sunday 48 tablet 6  . UNABLE TO FIND Under pads use as needed  Pullups use as needed   Length of need- 99 months DX urinary incontinence 100 each 11  . UNABLE TO FIND Incontinence briefs and pads Dx incontinence 100 each 11   No current facility-administered medications for this visit.   Allergies:  Ace inhibitors   Social History: The patient  reports that she has been smoking cigarettes. She has a 7.50 pack-year smoking history. She has never used smokeless tobacco. She reports that she does not drink alcohol and  does not use drugs.   Family History: The patient's family history includes Breast cancer in her sister; Cancer in her brother and father; Colon cancer in her maternal aunt; Heart attack in her mother.   ROS:  Please see the history of present illness. Otherwise, complete review of systems is positive for none.  All other systems are reviewed and negative.   Physical Exam: VS:  BP (!) 144/76   Pulse 84   Ht 5\' 7"  (1.702 m)   Wt 185 lb (83.9 kg)   SpO2 100%   BMI 28.98 kg/m , BMI Body mass index is 28.98 kg/m.  Wt Readings from Last 3 Encounters:  10/05/19 185 lb (83.9 kg)  10/02/19 185 lb 1.9 oz (84 kg)  02/13/19 169 lb (76.7 kg)    General: Patient appears comfortable at  rest. Neck: Supple, no elevated JVP or carotid bruits, no thyromegaly. Lungs: Clear to auscultation, nonlabored breathing at rest. Cardiac: Regular rate and rhythm, no S3 or significant systolic murmur, no pericardial rub. Extremities: 1+ pitting edema bilaterally lower extremities, distal pulses 2+. Skin: Warm and dry. Musculoskeletal: No kyphosis. Neuropsychiatric: Alert and oriented x3, affect grossly appropriate.  ECG:  10/01/2019 EKG shows sinus rhythm rate of 65, prolonged PR interval greater than 0.22.  Probable left atrial enlargement, borderline left axis deviation.  Recent Labwork: 01/22/2019: TSH 2.77 10/01/2019: ALT 55; AST 62; B Natriuretic Peptide 80.0; BUN 10; Creatinine, Ser 1.08; Hemoglobin 11.2; Platelets 151; Potassium 3.7; Sodium 137     Component Value Date/Time   CHOL 126 01/22/2019 0913   TRIG 246 (H) 01/22/2019 0913   HDL 23 (L) 01/22/2019 0913   CHOLHDL 5.5 (H) 01/22/2019 0913   VLDL 42 (H) 05/04/2016 1035   [place] 69 01/22/2019 0913    Other Studies Reviewed Today:   Diagnostic Studies 07/2003 Cath RESULTS:  HEMODYNAMICS:  1. Left ventricular pressure 145/12.  2. Aortic pressure 150/82.  3. There was no aortic valve gradient.  LEFT VENTRICULOGRAM: Wall motion is  normal. Ejection fraction estimated at  greater than or equal to 65%. There is no mitral regurgitation.  CORONARY ARTERIOGRAPHY (CO-DOMINANT):  1. Left main is normal.  2. Left anterior descending artery has a tubular 20% stenosis in the  proximal to mid vessel. The distal LAD has a tubular 80% stenosis. The  apical LAD has a tubular 80% stenosis. The LAD gives rise to a small  diagonal branch.  3. The left circumflex was a large co-dominant vessel. It gives rise to a  large first obtuse marginal branch. There is a 90% stenosis in the mid  portion of the first obtuse marginal branch. There is haziness  associated with this lesion, and it has the appearance of a positive  ruptured plaque. There is a normal sized second obtuse marginal branch,  which has a 50% stenosis proximally and a 60% stenosis distally. The  distal circumflex also gives rise to 2 small posterolateral branches.  4. The right coronary artery is a relatively small co-dominant vessel.  There is diffuse 60% stenosis in the proximal vessel. In the mid vessel  at the acute margin, there is a 20% stenosis and the distal vessel has a  20% stenosis. There is a small to normal sized acute marginal branch,  which has a 70% at its origin. The distal right coronary artery also  gives rise to a small posterior descending coronary artery and a small  posterolateral branch.  IMPRESSION:  1. Normal left ventricular systolic function.  2. Three-vessel coronary artery disease, as described. The culprit lesion  appears to be the 90% stenosis in the first obtuse marginal branch.  There is also significant disease in the distal LAD and moderate, but  nonobstructive, disease in the small right coronary artery.  PLAN: Percutaneous intervention of the obtuse marginal and the LAD, see  below.  PTCA PROCEDURAL NOTE: Following completion of diagnostic catheterization,  we proceeded with percutaneous coronary  intervention. We utilized the  preexisting #6 French sheath in the right femoral artery. Heparin and  Integrilin were administered per protocol. We used a #6 [place] LS 3.5  guiding catheter. A BMW wire was advanced under fluoroscopic guidance into  the distal portion of the first obtuse marginal branch. We then performed  PTCA of the 90% stenosis in the obtuse marginal with a 2.5 x  12 mm Quantum  balloon inflated to 10 atmospheres. We then positioned a 2.5 x 13 mm CYPHER  drug-eluting stent across the area of diseased vessel and deployed the stent  at 15 atmospheres. We then went back with a 2.5 x 12 mm Quantum balloon  within the stent and inflated this balloon to 22 atmospheres. The final  angiographic images were obtained revealing patency of the obtuse marginal  branch with 0% residual stenosis and TIMI 3 flow.  We then turned our attention to the LAD. The BMW wire was advanced under  fluoroscopic guidance into the apical portion of the LAD. We then performed  PTCA of the 80% stenosis in the distal LAD with a 2.0 x 15 mm Quantum  balloon inflated to 12 atmospheres. We then positioned a 2.0 x 15 mm Mini  Vision bare metal stent across the area of segment of disease and deployed  the stent at 12 atmospheres. We then went back with a 2.25 x 12 mm Quantum  balloon and positioning this within the stent, we inflated this balloon to  14 atmospheres in the proximal and distal edges of the stent. Final  angiographic images are obtained revealing patency of the LAD at the stent  site and with 0% residual stenosis and TIMI 3 flow.  COMPLICATIONS: None.  RESULTS:  1. Successful PTCA with placement of a drug-eluting stent in the first  obtuse marginal branch. A 90% stenosis with haziness was reduced to 0%  residual with TIMI 3 flow.  2. Successful PTCA with placement of a bare metal stent in the distal left  anterior descending artery. A tubular 80% stenosis was reduced  to 0%  residual with TIMI flow.  PLAN: Integrilin will be continued for 18 hours. It is recommended that  the patient will be treated with Plavix for a minimum of 6-9 months. She  also needs aggressive risk factor modification. Regarding the residual  disease in the apical LAD, at this point, would recommend continue medical  therapy. If she has recurrent chest pain, which is refractor to medical  therapy, percutaneous coronary intervention of the apical LAD could be  considered.  08/2008 Echo Study Conclusions  1. Left ventricle: The cavity size was normal. Systolic function was normal. The estimated ejection fraction was in the range of 55% to 65%. Wall motion was normal; there were no regional wall motion abnormalities. 2. Aortic valve: Trivial regurgitation. 3. Mitral valve: No evidence of vegetation. 4. Left atrium: No evidence of thrombus in the atrial cavity or appendage. 5. Atrial septum: Echo contrast study showed no right-to-left atrial level shunt, in the baseline state (Late bubbles felt to be nondiagnostic) There was an atrial septal aneurysm.  06/2015 echo Study Conclusions  - Left ventricle: The cavity size was normal. Wall thickness was increased in a pattern of mild LVH. Systolic function was normal. The estimated ejection fraction was in the range of 60% to 65%. Wall motion was normal; there were no regional wall motion abnormalities. Doppler parameters are consistent with abnormal left ventricular relaxation (grade 1 diastolic dysfunction). - Atrial septum: No defect or patent foramen ovale was identified.  .  Assessment and Plan:  1. Anasarca   2. Congestive heart failure, unspecified HF chronicity, unspecified heart failure type ([place])   3. CAD in native artery   4. Mixed hyperlipidemia   5. Essential hypertension   6. Bilateral edema of lower extremity   7. History of CVA (cerebrovascular accident)    1. Anasarca Recent  history of swelling in hands, feet, face, abdomen. ED presentation for same.  2. Congestive heart failure, unspecified HF chronicity, unspecified heart failure type [facility]) Patient recently had a 16 pound weight gain with edema and face, arms, abdomen and lower extremities.  Please get a repeat echocardiogram to check LV function, diastolic function, valvular function.  Continue Lasix 40 mg p.o. twice daily for 14 days until follow-up.  We will get a BMP and magnesium at follow-up.  Continue Toprol 25 mg daily.  3. CAD in native artery No recent complaints of anginal symptoms.  Continue aspirin 81 mg.  4. Mixed hyperlipidemia Patient is taking Crestor 20 mg every Monday, Wednesday, Friday and Sunday.  Husband states patient's liver enzymes have been elevated.  He is concerned one of the medications may be contributing to elevated liver enzymes.  Advised him patient can take the Crestor every 2 days instead of every other day and we her PCP could do a follow-up fasting lipid profile with LFTs in the next 8+ weeks to see if liver enzymes were trending down and medication reduction improves the numbers.  Continue Zetia 10 mg daily.  5. Essential hypertension Blood pressure today 144/76 with a heart rate of 84.  6. Bilateral edema of lower extremity History of bilateral lower extremity edema previously taking lower doses of Lasix for treatment.  We have increased Lasix dosage to 40 mg p.o. twice daily for the next 14 days.  Hopefully this will help with increased lower extremity edema.  7. History of CVA (cerebrovascular accident) Previous history of CVA with right-sided hemiparesis.  Patient is noncommunicative verbally secondary to stroke.  But is very communicative nonverbally.  She is in a wheelchair today.  Medication Adjustments/Labs and Tests Ordered: Current medicines are reviewed at length with the patient today.  Concerns regarding medicines are outlined above.   Disposition: Follow-up  with Dr. Harl Bowie or APP 2 weeks  Signed, Levell July, NP 10/05/2019 2:53 PM    [facility] [place], [place], [place] [address] Phone: [phone]; Fax: [phone]

## 2019-10-05 NOTE — Assessment & Plan Note (Signed)
Limits mobility. Fall risk reduction discussed Need for incontinece pads and undergarments justified due to poor mobility

## 2019-10-17 ENCOUNTER — Ambulatory Visit (HOSPITAL_COMMUNITY)
Admission: RE | Admit: 2019-10-17 | Discharge: 2019-10-17 | Disposition: A | Discharge status: Home / Self Care | Payer: Medicare Other | Source: Ambulatory Visit | Attending: Family Medicine | Admitting: Family Medicine

## 2019-10-17 ENCOUNTER — Other Ambulatory Visit: Payer: Self-pay

## 2019-10-17 DIAGNOSIS — R600 Localized edema: Secondary | ICD-10-CM

## 2019-10-17 DIAGNOSIS — I509 Heart failure, unspecified: Secondary | ICD-10-CM

## 2019-10-17 NOTE — Progress Notes (Signed)
*  PRELIMINARY RESULTS* Echocardiogram 2D Echocardiogram has been performed.  Danielle Cruz 10/17/2019, 3:50 PM

## 2019-10-19 ENCOUNTER — Other Ambulatory Visit: Payer: Self-pay | Admitting: Family Medicine

## 2019-10-19 DIAGNOSIS — Z1231 Encounter for screening mammogram for malignant neoplasm of breast: Secondary | ICD-10-CM

## 2019-10-19 DIAGNOSIS — E785 Hyperlipidemia, unspecified: Secondary | ICD-10-CM

## 2019-10-19 DIAGNOSIS — I10 Essential (primary) hypertension: Secondary | ICD-10-CM

## 2019-10-23 ENCOUNTER — Ambulatory Visit (INDEPENDENT_AMBULATORY_CARE_PROVIDER_SITE_OTHER): Payer: Medicare Other | Admitting: Student

## 2019-10-23 ENCOUNTER — Other Ambulatory Visit: Payer: Self-pay

## 2019-10-23 ENCOUNTER — Encounter: Payer: Self-pay | Admitting: Student

## 2019-10-23 VITALS — BP 132/64 | HR 80 | Ht 67.0 in | Wt 189.0 lb

## 2019-10-23 DIAGNOSIS — I2510 Atherosclerotic heart disease of native coronary artery without angina pectoris: Secondary | ICD-10-CM | POA: Diagnosis not present

## 2019-10-23 DIAGNOSIS — I10 Essential (primary) hypertension: Secondary | ICD-10-CM | POA: Diagnosis not present

## 2019-10-23 DIAGNOSIS — Z79899 Other long term (current) drug therapy: Secondary | ICD-10-CM

## 2019-10-23 DIAGNOSIS — R600 Localized edema: Secondary | ICD-10-CM | POA: Diagnosis not present

## 2019-10-23 DIAGNOSIS — Z8673 Personal history of transient ischemic attack (TIA), and cerebral infarction without residual deficits: Secondary | ICD-10-CM

## 2019-10-23 DIAGNOSIS — E782 Mixed hyperlipidemia: Secondary | ICD-10-CM

## 2019-10-23 MED ORDER — TORSEMIDE 20 MG PO TABS
20.0000 mg | ORAL_TABLET | Freq: Every day | ORAL | 3 refills | Status: DC
Start: 2019-10-23 — End: 2019-12-04

## 2019-10-23 NOTE — Progress Notes (Signed)
Cardiology Office Note    Date:  10/23/2019   ID:  Danielle Cruz, DOB 1955-12-25, MRN [id number]  PCP:  Fayrene Helper, MD  Cardiologist: Carlyle Dolly, MD    Chief Complaint  Patient presents with  . Follow-up    3 week visit    History of Present Illness:    Danielle Cruz is a 64 y.o. female with past medical history of CAD (s/p prior stenting to LAD and OM in 2005), HTN, HLD lower extremity edema (thought to be secondary to venous insufficiency), tobacco use and prior CVA who presents to the office today for 3-week follow-up.  She was last examined by Katina Dung, NP on 10/05/2019 and had recently been evaluated in the ED for worsening edema and was given IV Lasix with improvement in her symptoms. She had followed up with her PCP in the interim who recommended she take Lasix 40 mg twice a day for the next 10 days (previously on 20mg  daily). She was aphasic given her prior CVA but the patient's husband mentioned no significant change in her dyspnea. Weight was at 185 lbs and she was continued on the increased dose of Lasix 40 mg twice daily and a repeat echocardiogram was recommended to assess for any structural abnormalities. This showed a preserved EF of 60 to 65% with no regional wall motion normalities. She did have mild LVH and grade 1 diastolic dysfunction with mild aortic valve regurgitation.  In talking with the patient and her husband today, she is aphasic given her prior CVA but still communicates by nodding her head and her husband provides most of the history. She reports that her lower extremity edema did not improve with taking Lasix 40 mg twice a day and her weight has actually increased by an additional 4 pounds since her visit last month. She denies any associated orthopnea, PND, dyspnea on exertion, chest pain or palpitations.  They have been monitoring her sodium intake but she does not elevate her legs during the day. She does have compression stockings but does not  utilize these regularly.  Past Medical History:  Diagnosis Date  . AKI (acute kidney injury) ([place]) 09/22/2017  . Anemia   . 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 Arthritis   . CHF (congestive heart failure) ([place]) 10/02/2019  . Colonic polyp 2010   Hemorrhoids; h/o mild hematochezia  . CVA (cerebral infarction) 08/2008   sizable left -residual expressive aphasia, right sided weakness; ambulates with difficulty with the right leg brace   . Hyperlipidemia   . Hypertension 2005  . Rheumatoid arthritis(714.0)   . Stroke [facility])     Past Surgical History:  Procedure Laterality Date  . ANKLE SURGERY     Right  . CAROTID STENT INSERTION    . COLONOSCOPY W/ POLYPECTOMY  11/2008   Dr. Gala Romney. Left-sided diverticula, Pedunculated polyp snared (no adenomatous changes)  . COLONOSCOPY WITH PROPOFOL N/A 01/12/2018   Procedure: COLONOSCOPY WITH PROPOFOL;  Surgeon: Daneil Dolin, MD;  [facility];  Service: Endoscopy;  Laterality: N/A;  . FEMUR IM NAIL Right 09/23/2017   Procedure: INTRAMEDULLARY (IM) NAIL FEMORAL;  Surgeon: Renette Butters, MD;  Location: [facility];  Service: Orthopedics;  Laterality: Right;  . FRACTURE SURGERY     Hip replacement in May 2019 - fall related   . OOPHORECTOMY      Current Medications: Outpatient Medications  Prior to Visit  Medication Sig Dispense Refill  . aspirin EC 81 MG tablet Take 81 mg by mouth daily.    Marland Kitchen ezetimibe (ZETIA) 10 MG tablet Take 1 tablet (10 mg total) by mouth daily. 90 tablet 3  . fluticasone (FLONASE) 50 MCG/ACT nasal spray SHAKE LIQUID AND USE 1 SPRAY IN EACH NOSTRIL DAILY 16 g 3  . HYDROcodone-acetaminophen (NORCO/VICODIN) 5-325 MG tablet Take 1 tablet by mouth daily as needed.  0  . hydroxychloroquine (PLAQUENIL) 200 MG tablet Take 400 mg by mouth daily.    . metoprolol succinate (TOPROL-XL) 25 MG 24 hr tablet TAKE 1 TABLET(25 MG) BY MOUTH DAILY 30 tablet 11  .  montelukast (SINGULAIR) 10 MG tablet TAKE 1 TABLET(10 MG) BY MOUTH AT BEDTIME 30 tablet 4  . Multiple Vitamin (MULTIVITAMIN WITH MINERALS) TABS tablet Take 1 tablet by mouth daily.    . nicotine (NICODERM CQ - DOSED IN MG/24 HOURS) 14 mg/24hr patch Place 1 patch (14 mg total) onto the skin daily. 28 patch 1  . oxybutynin (DITROPAN) 5 MG tablet TAKE 1/2 TABLET(2.5 MG) BY MOUTH TWICE DAILY 90 tablet 3  . pantoprazole (PROTONIX) 40 MG tablet TAKE 1 TABLET(40 MG) BY MOUTH DAILY 30 tablet 5  . rosuvastatin (CRESTOR) 20 MG tablet Take one tablet by mouth every Monday, Wednesday, Friday and Sunday 48 tablet 6  . UNABLE TO FIND Under pads use as needed  Pullups use as needed   Length of need- 99 months DX urinary incontinence 100 each 11  . UNABLE TO FIND Incontinence briefs and pads Dx incontinence 100 each 11  . furosemide (LASIX) 20 MG tablet Take 20 mg by mouth daily.    . furosemide (LASIX) 40 MG tablet Take 1 tablet (40 mg total) by mouth 2 (two) times daily for 10 days. 20 tablet 0  . potassium chloride SA (KLOR-CON) 20 MEQ tablet Take 1 tablet (20 mEq total) by mouth 3 (three) times daily for 10 days. 30 tablet 0   No facility-administered medications prior to visit.     Allergies:   Ace inhibitors   Social History   Socioeconomic History  . Marital status: Married    Spouse name: Not on file  . Number of children: 3  . Years of education: 11th grade  . Highest education level: 11th grade  Occupational History  . Occupation: disabilty x 9years    Comment: secondary to arthritis   Tobacco Use  . Smoking status: Current Every Day Smoker    Packs/day: 0.50    Years: 15.00    Pack years: 7.50    Types: Cigarettes  . Smokeless tobacco: Never Used  . Tobacco comment: quit after hospitized for hip fracture   Vaping Use  . Vaping Use: Never used  Substance and Sexual Activity  . Alcohol use: No    Alcohol/week: 0.0 standard drinks    Comment: quit 6 years ago   . Drug use: No    . Sexual activity: Not on file  Other Topics Concern  . Not on file  Social History Narrative   Pt gave birth to 4 children, 1 deceased at 31 weeks was a premature baby, 3 are living ages range 36 to 21 all healthy    Social Determinants of Health   Financial Resource Strain:   . Difficulty of Paying Living Expenses:   Food Insecurity:   . Worried About Charity fundraiser in the Last Year:   . Arboriculturist in  the Last Year:   Transportation Needs:   . Film/video editor (Medical):   Marland Kitchen Lack of Transportation (Non-Medical):   Physical Activity:   . Days of Exercise per Week:   . Minutes of Exercise per Session:   Stress:   . Feeling of Stress :   Social Connections:   . Frequency of Communication with Friends and Family:   . Frequency of Social Gatherings with Friends and Family:   . Attends Religious Services:   . Active Member of Clubs or Organizations:   . Attends Archivist Meetings:   Marland Kitchen Marital Status:      Family History:  The patient's family history includes Breast cancer in her sister; Cancer in her brother and father; Colon cancer in her maternal aunt; Heart attack in her mother.   Review of Systems:   Please see the history of present illness.     General:  No chills, fever, night sweats or weight changes.  Cardiovascular:  No chest pain, dyspnea on exertion, orthopnea, palpitations, paroxysmal nocturnal dyspnea. Positive for edema.  Dermatological: No rash, lesions/masses Respiratory: No cough, dyspnea Urologic: No hematuria, dysuria Abdominal:   No nausea, vomiting, diarrhea, bright red blood per rectum, melena, or hematemesis Neurologic:  No visual changes, wkns, changes in mental status. All other systems reviewed and are otherwise negative except as noted above.   Physical Exam:    VS:  BP 132/64   Pulse 80   Ht 5\' 7"  (1.702 m)   Wt 189 lb (85.7 kg)   SpO2 98%   BMI 29.60 kg/m    General: Well developed, well nourished,female  appearing in no acute distress. Head: Normocephalic, atraumatic, sclera non-icteric.  Neck: No carotid bruits. JVD not elevated.  Lungs: Respirations regular and unlabored, without wheezes or rales.  Heart: Regular rate and rhythm. No S3 or S4.  No murmur, no rubs, or gallops appreciated. Abdomen: Soft, non-tender, non-distended. No obvious abdominal masses. Msk:  Strength and tone appear normal for age. No obvious joint deformities or effusions. Extremities: No clubbing or cyanosis. 2+ pitting edema up to mid-shins bilaterally  Distal pedal pulses are 2+ bilaterally. Neuro:  Moves all extremities spontaneously. No focal deficits noted.  Psych:  Aphasic.  Skin: No rashes or lesions noted  Wt Readings from Last 3 Encounters:  10/23/19 189 lb (85.7 kg)  10/05/19 185 lb (83.9 kg)  10/02/19 185 lb 1.9 oz (84 kg)      Studies/Labs Reviewed:   EKG:  EKG is not ordered today.   Recent Labs: 01/22/2019: TSH 2.77 10/01/2019: ALT 55; B Natriuretic Peptide 80.0; BUN 10; Creatinine, Ser 1.08; Hemoglobin 11.2; Platelets 151; Potassium 3.7; Sodium 137   Lipid Panel    Component Value Date/Time   CHOL 126 01/22/2019 0913   TRIG 246 (H) 01/22/2019 0913   HDL 23 (L) 01/22/2019 0913   CHOLHDL 5.5 (H) 01/22/2019 0913   VLDL 42 (H) 05/04/2016 1035   LDLCALC 69 01/22/2019 0913    Additional studies/ records that were reviewed today include:   Echocardiogram: 09/2019 IMPRESSIONS    1. Left ventricular ejection fraction, by estimation, is 60 to 65%. The  left ventricle has normal function. The left ventricle has no regional  wall motion abnormalities. There is mild left ventricular hypertrophy.  Left ventricular diastolic parameters  are consistent with Grade I diastolic dysfunction (impaired relaxation).  Elevated left atrial pressure.  2. Right ventricular systolic function is normal. The right ventricular  size is normal.  3.  The mitral valve is normal in structure. No evidence of  mitral valve  regurgitation. No evidence of mitral stenosis.  4. The aortic valve has an indeterminant number of cusps. Aortic valve  regurgitation is mild. Mild to moderate aortic valve  sclerosis/calcification is present, without any evidence of aortic  stenosis.  5. The inferior vena cava is normal in size with greater than 50%  respiratory variability, suggesting right atrial pressure of 3 mmHg.   Assessment:    1. Bilateral edema of lower extremity   2. Medication management   3. Coronary artery disease involving native coronary artery of native heart without angina pectoris   4. Essential hypertension   5. Mixed hyperlipidemia   6. History of CVA (cerebrovascular accident)      Plan:   In order of problems listed above:  1. Lower Extremity Edema - She has a history of chronic lower extremity edema by review of notes which was thought to be secondary to venous insufficiency. Recent echocardiogram showed a preserved EF with no wall motion abnormalities and mild LVH with grade 1 diastolic dysfunction as outlined above. - She was previously on Lasix 20 mg daily and this was titrated to 40 mg twice daily for 2 weeks with minimal improvement in her symptoms. Her baseline weight is 165 - 170 lbs, now at 189 lbs which is up an additional 4 lbs since her last visit. Reviewed options with the patient and will try switching from Lasix to Torsemide 20 mg daily for improved bioavailability. Will likely require a higher dose if follow-up labs are reassuring. She does have a history of AKI with diuretic therapy in the past and will recheck a BMET to assess renal function and electrolytes. We reviewed limiting sodium intake and keeping fluid intake to less than 2 L each day. Also recommended the use of compression stockings and elevating her lower extremities as much as possible.  2. CAD - She is s/p prior stenting to LAD and OM in 2005. Most recent echocardiogram last month showed a preserved  EF with no regional wall motion abnormalities as outlined above. She is not active at baseline due to her lower extremity weakness following her CVA but denies any recent chest pain or dyspnea. - Continue current medication regimen with ASA 81 mg daily, Zetia 10 mg daily, Toprol-XL 25 mg daily and Crestor as outlined below.  3. HTN - BP is well controlled at 132/64 during today's visit.  Continue current medication regimen with Toprol-XL 25 mg daily.  4. HLD - Followed by her PCP.  FLP in 01/2019 showed LDL at 69. She remains on Crestor 20 mg every other day and Zetia 10mg  daily.   5. History of CVA - She does have residual aphasia and most history is provided by her husband during today's visit.  Medication Adjustments/Labs and Tests Ordered: Current medicines are reviewed at length with the patient today.  Concerns regarding medicines are outlined above.  Medication changes, Labs and Tests ordered today are listed in the Patient Instructions below. Patient Instructions  Medication Instructions:  Your physician has recommended you make the following change in your medication:   Stop Taking Lasix  Start Torsemide 20 mg Daily   *If you need a refill on your cardiac medications before your next appointment, please call your pharmacy*   Lab Work: Your physician recommends that you return for lab work in: 2 Weeks   If you have labs (blood work) drawn today and your tests are completely  normal, you will receive your results only by: Marland Kitchen MyChart Message (if you have MyChart) OR . A paper copy in the mail If you have any lab test that is abnormal or we need to change your treatment, we will call you to review the results.   Testing/Procedures: NONE   Follow-Up: At [facility], you and your health needs are our priority.  As part of our continuing mission to provide you with exceptional heart care, we have created designated Provider Care Teams.  These Care Teams include your primary  Cardiologist (physician) and Advanced Practice Providers (APPs -  Physician Assistants and Nurse Practitioners) who all work together to provide you with the care you need, when you need it.  We recommend signing up for the patient portal called "MyChart".  Sign up information is provided on this After Visit Summary.  MyChart is used to connect with patients for Virtual Visits (Telemedicine).  Patients are able to view lab/test results, encounter notes, upcoming appointments, etc.  Non-urgent messages can be sent to your provider as well.   To learn more about what you can do with MyChart, go to [email or web address].    Your next appointment:   4 week(s)  The format for your next appointment:   In Person  Provider:   Carlyle Dolly, MD or Bernerd Pho, PA-C   Other Instructions Thank you for choosing [facility]!    Signed, Erma Heritage, PA-C  10/23/2019 5:50 PM    [facility] [address] [place], [place] [address] Phone: [phone] Fax: [phone]

## 2019-10-23 NOTE — Progress Notes (Deleted)
Cardiology Office Note    Date:  10/23/2019   ID:  Danielle Cruz, DOB 12-30-1955, MRN [id number]  PCP:  Fayrene Helper, MD  Cardiologist: Carlyle Dolly, MD    No chief complaint on file.   History of Present Illness:    Danielle Cruz is a 64 y.o. 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  She was last examined by Katina Dung, NP on 10/05/2019 and had recently been evaluated in the ED for worsening edema and was given IV Lasix with improvement in her symptoms.  She had followed up with her PCP in the interim who recommended she take Lasix 40 mg twice a day for the next 10 days (previously on 20mg  daily). She was aphasic given her prior CVA but the patient's husband mentioned no significant change in her dyspnea. Weight was at 185 lbs and she was continued on the increased dose of Lasix 40 mg twice daily and a repeat echocardiogram was recommended to assess for any structural abnormalities.  This showed a preserved EF of 60 to 65% with no regional wall motion normalities. She did have mild LVH and grade 1 diastolic dysfunction with mild aortic valve regurgitation.`   - BMET!  Past Medical History:  Diagnosis Date  . AKI (acute kidney injury) ([place]) 09/22/2017  . Anemia   . 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 Arthritis   . CHF (congestive heart failure) ([place]) 10/02/2019  . Colonic polyp 2010   Hemorrhoids; h/o mild hematochezia  . CVA (cerebral infarction) 08/2008   sizable left -residual expressive aphasia, right sided weakness; ambulates with difficulty with the right leg brace   . Hyperlipidemia   . Hypertension 2005  . Rheumatoid arthritis(714.0)   . Stroke [facility])     Past Surgical History:  Procedure  Laterality Date  . ANKLE SURGERY     Right  . CAROTID STENT INSERTION    . COLONOSCOPY W/ POLYPECTOMY  11/2008   Dr. Gala Romney. Left-sided diverticula, Pedunculated polyp snared (no adenomatous changes)  . COLONOSCOPY WITH PROPOFOL N/A 01/12/2018   Procedure: COLONOSCOPY WITH PROPOFOL;  Surgeon: Daneil Dolin, MD;  [facility];  Service: Endoscopy;  Laterality: N/A;  . FEMUR IM NAIL Right 09/23/2017   Procedure: INTRAMEDULLARY (IM) NAIL FEMORAL;  Surgeon: Renette Butters, MD;  Location: [place];  Service: Orthopedics;  Laterality: Right;  . FRACTURE SURGERY     Hip replacement in May 2019 - fall related   . OOPHORECTOMY      Current Medications: Outpatient Medications Prior to Visit  Medication Sig Dispense Refill  . aspirin EC 81 MG tablet Take 81 mg by mouth daily.    Marland Kitchen ezetimibe (ZETIA) 10 MG tablet Take 1 tablet (10 mg total) by mouth daily. 90 tablet 3  . fluticasone (FLONASE) 50 MCG/ACT nasal spray SHAKE LIQUID AND USE 1 SPRAY IN EACH NOSTRIL DAILY 16 g 3  . furosemide (LASIX) 40 MG tablet Take 1 tablet (40 mg total) by mouth 2 (two) times daily for 10 days. 20 tablet 0  . HYDROcodone-acetaminophen (NORCO/VICODIN) 5-325 MG tablet Take 1 tablet by mouth daily as needed.  0  . hydroxychloroquine (PLAQUENIL) 200 MG tablet Take  400 mg by mouth daily.    . metoprolol succinate (TOPROL-XL) 25 MG 24 hr tablet TAKE 1 TABLET(25 MG) BY MOUTH DAILY 30 tablet 11  . montelukast (SINGULAIR) 10 MG tablet TAKE 1 TABLET(10 MG) BY MOUTH AT BEDTIME 30 tablet 4  . Multiple Vitamin (MULTIVITAMIN WITH MINERALS) TABS tablet Take 1 tablet by mouth daily.    . nicotine (NICODERM CQ - DOSED IN MG/24 HOURS) 14 mg/24hr patch Place 1 patch (14 mg total) onto the skin daily. 28 patch 1  . oxybutynin (DITROPAN) 5 MG tablet TAKE 1/2 TABLET(2.5 MG) BY MOUTH TWICE DAILY 90 tablet 3  . pantoprazole (PROTONIX) 40 MG tablet TAKE 1 TABLET(40 MG) BY MOUTH DAILY 30 tablet 5  . potassium chloride SA (KLOR-CON) 20  MEQ tablet Take 1 tablet (20 mEq total) by mouth 3 (three) times daily for 10 days. 30 tablet 0  . rosuvastatin (CRESTOR) 20 MG tablet Take one tablet by mouth every Monday, Wednesday, Friday and Sunday 48 tablet 6  . UNABLE TO FIND Under pads use as needed  Pullups use as needed   Length of need- 99 months DX urinary incontinence 100 each 11  . UNABLE TO FIND Incontinence briefs and pads Dx incontinence 100 each 11   No facility-administered medications prior to visit.     Allergies:   Ace inhibitors   Social History   Socioeconomic History  . Marital status: Married    Spouse name: Not on file  . Number of children: 3  . Years of education: 11th grade  . Highest education level: 11th grade  Occupational History  . Occupation: disabilty x 9years    Comment: secondary to arthritis   Tobacco Use  . Smoking status: Current Every Day Smoker    Packs/day: 0.50    Years: 15.00    Pack years: 7.50    Types: Cigarettes  . Smokeless tobacco: Never Used  . Tobacco comment: quit after hospitized for hip fracture   Vaping Use  . Vaping Use: Never used  Substance and Sexual Activity  . Alcohol use: No    Alcohol/week: 0.0 standard drinks    Comment: quit 6 years ago   . Drug use: No  . Sexual activity: Not on file  Other Topics Concern  . Not on file  Social History Narrative   Pt gave birth to 4 children, 1 deceased at 6 weeks was a premature baby, 3 are living ages range 75 to 6 all healthy    Social Determinants of Health   Financial Resource Strain:   . Difficulty of Paying Living Expenses:   Food Insecurity:   . Worried About Charity fundraiser in the Last Year:   . Arboriculturist in the Last Year:   Transportation Needs:   . Film/video editor (Medical):   Marland Kitchen Lack of Transportation (Non-Medical):   Physical Activity:   . Days of Exercise per Week:   . Minutes of Exercise per Session:   Stress:   . Feeling of Stress :   Social Connections:   . Frequency  of Communication with Friends and Family:   . Frequency of Social Gatherings with Friends and Family:   . Attends Religious Services:   . Active Member of Clubs or Organizations:   . Attends Archivist Meetings:   Marland Kitchen Marital Status:      Family History:  The patient's ***family history includes Breast cancer in her sister; Cancer in her brother and father; Colon  cancer in her maternal aunt; Heart attack in her mother.   Review of Systems:   Please see the history of present illness.     General:  No chills, fever, night sweats or weight changes.  Cardiovascular:  No chest pain, dyspnea on exertion, edema, orthopnea, palpitations, paroxysmal nocturnal dyspnea. Dermatological: No rash, lesions/masses Respiratory: No cough, dyspnea Urologic: No hematuria, dysuria Abdominal:   No nausea, vomiting, diarrhea, bright red blood per rectum, melena, or hematemesis Neurologic:  No visual changes, wkns, changes in mental status. All other systems reviewed and are otherwise negative except as noted above.   Physical Exam:    VS:  There were no vitals taken for this visit.   General: Well developed, well nourished,female appearing in no acute distress. Head: Normocephalic, atraumatic, sclera non-icteric.  Neck: No carotid bruits. JVD not elevated.  Lungs: Respirations regular and unlabored, without wheezes or rales.  Heart: ***Regular rate and rhythm. No S3 or S4.  No murmur, no rubs, or gallops appreciated. Abdomen: Soft, non-tender, non-distended. No obvious abdominal masses. Msk:  Strength and tone appear normal for age. No obvious joint deformities or effusions. Extremities: No clubbing or cyanosis. No edema.  Distal pedal pulses are 2+ bilaterally. Neuro: Alert and oriented X 3. Moves all extremities spontaneously. No focal deficits noted. Psych:  Responds to questions appropriately with a normal affect. Skin: No rashes or lesions noted  Wt Readings from Last 3 Encounters:    10/05/19 185 lb (83.9 kg)  10/02/19 185 lb 1.9 oz (84 kg)  02/13/19 169 lb (76.7 kg)        Studies/Labs Reviewed:   EKG:  EKG is*** ordered today.  The ekg ordered today demonstrates ***  Recent Labs: 01/22/2019: TSH 2.77 10/01/2019: ALT 55; B Natriuretic Peptide 80.0; BUN 10; Creatinine, Ser 1.08; Hemoglobin 11.2; Platelets 151; Potassium 3.7; Sodium 137   Lipid Panel    Component Value Date/Time   CHOL 126 01/22/2019 0913   TRIG 246 (H) 01/22/2019 0913   HDL 23 (L) 01/22/2019 0913   CHOLHDL 5.5 (H) 01/22/2019 0913   VLDL 42 (H) 05/04/2016 1035   LDLCALC 69 01/22/2019 0913    Additional studies/ records that were reviewed today include:   Echocardiogram: 09/2019 IMPRESSIONS    1. Left ventricular ejection fraction, by estimation, is 60 to 65%. The  left ventricle has normal function. The left ventricle has no regional  wall motion abnormalities. There is mild left ventricular hypertrophy.  Left ventricular diastolic parameters  are consistent with Grade I diastolic dysfunction (impaired relaxation).  Elevated left atrial pressure.  2. Right ventricular systolic function is normal. The right ventricular  size is normal.  3. The mitral valve is normal in structure. No evidence of mitral valve  regurgitation. No evidence of mitral stenosis.  4. The aortic valve has an indeterminant number of cusps. Aortic valve  regurgitation is mild. Mild to moderate aortic valve  sclerosis/calcification is present, without any evidence of aortic  stenosis.  5. The inferior vena cava is normal in size with greater than 50%  respiratory variability, suggesting right atrial pressure of 3 mmHg.   Assessment:    No diagnosis found.   Plan:   In order of problems listed above:  1. ***    Medication Adjustments/Labs and Tests Ordered: Current medicines are reviewed at length with the patient today.  Concerns regarding medicines are outlined above.  Medication changes,  Labs and Tests ordered today are listed in the Patient Instructions below. There are  no Patient Instructions on file for this visit.   Signed, Erma Heritage, PA-C  10/23/2019 11:04 AM    [facility]. [address] [place], [place] [address] Phone: [phone] Fax: [phone]

## 2019-10-23 NOTE — Patient Instructions (Signed)
Medication Instructions:  Your physician has recommended you make the following change in your medication:   Stop Taking Lasix  Start Torsemide 20 mg Daily   *If you need a refill on your cardiac medications before your next appointment, please call your pharmacy*   Lab Work: Your physician recommends that you return for lab work in: 2 Weeks   If you have labs (blood work) drawn today and your tests are completely normal, you will receive your results only by: Marland Kitchen MyChart Message (if you have MyChart) OR . A paper copy in the mail If you have any lab test that is abnormal or we need to change your treatment, we will call you to review the results.   Testing/Procedures: NONE   Follow-Up: At [facility], you and your health needs are our priority.  As part of our continuing mission to provide you with exceptional heart care, we have created designated Provider Care Teams.  These Care Teams include your primary Cardiologist (physician) and Advanced Practice Providers (APPs -  Physician Assistants and Nurse Practitioners) who all work together to provide you with the care you need, when you need it.  We recommend signing up for the patient portal called "MyChart".  Sign up information is provided on this After Visit Summary.  MyChart is used to connect with patients for Virtual Visits (Telemedicine).  Patients are able to view lab/test results, encounter notes, upcoming appointments, etc.  Non-urgent messages can be sent to your provider as well.   To learn more about what you can do with MyChart, go to [email or web address].    Your next appointment:   4 week(s)  The format for your next appointment:   In Person  Provider:   Carlyle Dolly, MD or Bernerd Pho, PA-C   Other Instructions Thank you for choosing [facility]!

## 2019-11-14 ENCOUNTER — Encounter: Payer: Self-pay | Admitting: Family Medicine

## 2019-11-14 ENCOUNTER — Ambulatory Visit (INDEPENDENT_AMBULATORY_CARE_PROVIDER_SITE_OTHER): Payer: Medicare Other | Admitting: Family Medicine

## 2019-11-14 ENCOUNTER — Other Ambulatory Visit: Payer: Self-pay

## 2019-11-14 ENCOUNTER — Other Ambulatory Visit (HOSPITAL_COMMUNITY)
Admission: RE | Admit: 2019-11-14 | Discharge: 2019-11-14 | Disposition: A | Discharge status: Home / Self Care | Payer: Medicare Other | Source: Ambulatory Visit | Attending: Family Medicine | Admitting: Family Medicine

## 2019-11-14 VITALS — BP 152/77 | HR 81 | Resp 16 | Ht 67.0 in | Wt 188.1 lb

## 2019-11-14 DIAGNOSIS — Z720 Tobacco use: Secondary | ICD-10-CM | POA: Diagnosis not present

## 2019-11-14 DIAGNOSIS — I10 Essential (primary) hypertension: Secondary | ICD-10-CM

## 2019-11-14 DIAGNOSIS — I2510 Atherosclerotic heart disease of native coronary artery without angina pectoris: Secondary | ICD-10-CM

## 2019-11-14 DIAGNOSIS — Z124 Encounter for screening for malignant neoplasm of cervix: Secondary | ICD-10-CM | POA: Diagnosis not present

## 2019-11-14 DIAGNOSIS — Z01419 Encounter for gynecological examination (general) (routine) without abnormal findings: Secondary | ICD-10-CM | POA: Insufficient documentation

## 2019-11-14 DIAGNOSIS — F17210 Nicotine dependence, cigarettes, uncomplicated: Secondary | ICD-10-CM | POA: Diagnosis not present

## 2019-11-14 DIAGNOSIS — Z1151 Encounter for screening for human papillomavirus (HPV): Secondary | ICD-10-CM | POA: Insufficient documentation

## 2019-11-14 MED ORDER — AMLODIPINE BESYLATE 5 MG PO TABS
5.0000 mg | ORAL_TABLET | Freq: Every day | ORAL | 3 refills | Status: DC
Start: 2019-11-14 — End: 2020-03-18

## 2019-11-14 NOTE — Patient Instructions (Signed)
F/U IN OFFICE , RE EVAL bp IN 8 TO 10 WEEKS, CALL IF YOU NEED ME SOONER  PLEASE START USING NICODERM PATCH AND STOP SMOKING, STILL SMOKING 10/DAY AND NEED TO QUIT!  NEW ADDITIONAL MEDICATION FOR BLOOD PRESSURE IS AMLODIPINE 5 MG DAILY  PAP IS SENT  BE CAREFUL NOT TO FALL

## 2019-11-17 ENCOUNTER — Encounter: Payer: Self-pay | Admitting: Family Medicine

## 2019-11-17 DIAGNOSIS — Z01419 Encounter for gynecological examination (general) (routine) without abnormal findings: Secondary | ICD-10-CM | POA: Insufficient documentation

## 2019-11-17 NOTE — Progress Notes (Signed)
   Danielle Cruz     MRN: [id number]      DOB: 06-Jun-1955   HPI Danielle Cruz is here for GYN exam and Pap.  Of note patient states she is ready to quit smoking she is still smoking 10 cigarettes a day however she and her husband expressed the feeling that should she get the NicoDerm patch she will stop smoking which she does need to do.  Of note for her visit her blood pressure is elevated and on record review this has been the pattern so there is an adjustment made in her antihypertensive medicine also.   ROS Denies recent fever or chills. Denies sinus pressure, nasal congestion, ear pain or sore throat. Denies chest congestion, productive cough or wheezing. Denies chest pains, palpitations and leg swelling Denies abdominal pain, nausea, vomiting,diarrhea or constipation.   Denies dysuria, frequency, hesitancy or incontinence.  Denies skin break down or rash.   PE  BP (!) 152/77   Pulse 81   Resp 16   Ht 5\' 7"  (1.702 m)   Wt 188 lb 1.9 oz (85.3 kg)   SpO2 99%   BMI 29.46 kg/m   Patient alert and oriented and in no cardiopulmonary distress.  HEENT: No facial asymmetry, EOMI,     Neck supple .  Chest: Clear to auscultation bilaterally.  CVS: S1, S2 no murmurs, no S3.Regular rate.  ABD: Soft non tender.   Gyne: external genitalia, normal female, no ulcers, or drainage noted, normal female distribution of hair. Internal: Vaginal walls pink and moist, no ulcers, cervix healthy, no cervical or adnexal tenderness, uterus normal size , no palpable adnexa mass. Physiologic discharge  present  MS: decreased  ROM spine, shoulders, hips and knees.  Skin: Intact, no ulcerations or rash noted.  Assessment & Plan  Encounter for gynecological examination with Papanicolaou smear of cervix Exam as documented, pap sent  Essential hypertension Uncontrolled, medications adjusted, with close g/u  Tobacco abuse Asked:confirms currently smokes cigarettes Assess: willing to quit and  cutting back Advise: needs to QUIT to reduce risk of cancer, cardio and cerebrovascular disease Assist: counseled for 5 minutes and literature provided, nicoderm prescribed Arrange: follow up in 3 months

## 2019-11-17 NOTE — Assessment & Plan Note (Signed)
Asked:confirms currently smokes cigarettes Assess: willing to quit and cutting back Advise: needs to QUIT to reduce risk of cancer, cardio and cerebrovascular disease Assist: counseled for 5 minutes and literature provided, nicoderm prescribed Arrange: follow up in 3 months

## 2019-11-17 NOTE — Assessment & Plan Note (Signed)
Uncontrolled, medications adjusted, with close g/u

## 2019-11-17 NOTE — Assessment & Plan Note (Signed)
Exam as documented , pap sent 

## 2019-11-19 LAB — CYTOLOGY - PAP
Comment: NEGATIVE
Comment: NEGATIVE
Diagnosis: NEGATIVE
HPV 16: NEGATIVE
HPV 18 / 45: NEGATIVE
High risk HPV: POSITIVE — AB

## 2019-12-04 ENCOUNTER — Encounter: Payer: Self-pay | Admitting: Student

## 2019-12-04 ENCOUNTER — Other Ambulatory Visit: Payer: Self-pay

## 2019-12-04 ENCOUNTER — Ambulatory Visit (INDEPENDENT_AMBULATORY_CARE_PROVIDER_SITE_OTHER): Payer: Medicare Other | Admitting: Student

## 2019-12-04 VITALS — BP 132/60 | HR 88 | Ht 67.0 in | Wt 184.0 lb

## 2019-12-04 DIAGNOSIS — E785 Hyperlipidemia, unspecified: Secondary | ICD-10-CM | POA: Diagnosis not present

## 2019-12-04 DIAGNOSIS — Z79899 Other long term (current) drug therapy: Secondary | ICD-10-CM

## 2019-12-04 DIAGNOSIS — I10 Essential (primary) hypertension: Secondary | ICD-10-CM

## 2019-12-04 DIAGNOSIS — I2510 Atherosclerotic heart disease of native coronary artery without angina pectoris: Secondary | ICD-10-CM | POA: Diagnosis not present

## 2019-12-04 DIAGNOSIS — R600 Localized edema: Secondary | ICD-10-CM

## 2019-12-04 MED ORDER — TORSEMIDE 20 MG PO TABS: 20.0000 mg | ORAL_TABLET | Freq: Every day | ORAL | 3 refills | Status: DC

## 2019-12-04 NOTE — Patient Instructions (Signed)
Medication Instructions:  Your physician recommends that you continue on your current medications as directed. Please refer to the Current Medication list given to you today.  *If you need a refill on your cardiac medications before your next appointment, please call your pharmacy*   Lab Work: Your physician recommends that you return for lab work in: Today   If you have labs (blood work) drawn today and your tests are completely normal, you will receive your results only by: MyChart Message (if you have MyChart) OR A paper copy in the mail If you have any lab test that is abnormal or we need to change your treatment, we will call you to review the results.   Testing/Procedures: NONE    Follow-Up: At [facility], you and your health needs are our priority.  As part of our continuing mission to provide you with exceptional heart care, we have created designated Provider Care Teams.  These Care Teams include your primary Cardiologist (physician) and Advanced Practice Providers (APPs -  Physician Assistants and Nurse Practitioners) who all work together to provide you with the care you need, when you need it.  We recommend signing up for the patient portal called "MyChart".  Sign up information is provided on this After Visit Summary.  MyChart is used to connect with patients for Virtual Visits (Telemedicine).  Patients are able to view lab/test results, encounter notes, upcoming appointments, etc.  Non-urgent messages can be sent to your provider as well.   To learn more about what you can do with MyChart, go to [email or web address].    Your next appointment:   6 month(s)  The format for your next appointment:   In Person  Provider:   Jonathan Branch, MD   Other Instructions Thank you for choosing [facility]!    

## 2019-12-04 NOTE — Progress Notes (Signed)
Cardiology Office Note    Date:  12/04/2019   ID:  Danielle Cruz, DOB 04-06-1956, MRN [id number]  PCP:  Fayrene Helper, MD  Cardiologist: Carlyle Dolly, MD    Chief Complaint  Patient presents with  . Follow-up    6 week visit    History of Present Illness:    Danielle Cruz is a 64 y.o. female with past medical history of CAD (s/p prior stenting to LAD and OM in 2005), HTN, HLD, lower extremity edema (thought to be secondary to venous insufficiency), tobacco use and prior CVA who presents to the office today for 6-week follow-up.   She was last examined by myself in 10/2019 and was taking Lasix 40mg  BID with no improvement in her edema and her weight had actually increased by 4 lbs since her last visit (was at 189 lbs with a reported baseline of 170 lbs). Lasix was discontinued and she was transitioned to Torsemide 20mg  daily. Follow-up BMET was recommended in 2 weeks but I cannot see where this was obtained.   In talking with the patient and her husband today, they report her lower extremity edema significantly improved after being switched to Torsemide at the time of her last visit. Weight has declined by 5 lbs on the office scales. She has also started elevating her legs during the evening hours. Her husband mentions she has a foot stool to use during the day but is not fond of this. She denies any recent dyspnea on exertion, orthopnea or PND.  No recent chest pain or palpitations.  She does try to limit her salt intake. They have a caregiver that helps to prepare most meals.    Past Medical History:  Diagnosis Date  . AKI (acute kidney injury) ([place]) 09/22/2017  . Anemia   . 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 Arthritis   . CHF (congestive heart failure) ([place]) 10/02/2019  . Colonic polyp 2010   Hemorrhoids; h/o mild hematochezia  . CVA (cerebral infarction) 08/2008   sizable left -residual expressive  aphasia, right sided weakness; ambulates with difficulty with the right leg brace   . Hyperlipidemia   . Hypertension 2005  . Rheumatoid arthritis(714.0)   . Stroke [facility])     Past Surgical History:  Procedure Laterality Date  . ANKLE SURGERY     Right  . CAROTID STENT INSERTION    . COLONOSCOPY W/ POLYPECTOMY  11/2008   Dr. Gala Romney. Left-sided diverticula, Pedunculated polyp snared (no adenomatous changes)  . COLONOSCOPY WITH PROPOFOL N/A 01/12/2018   Procedure: COLONOSCOPY WITH PROPOFOL;  Surgeon: Daneil Dolin, MD;  [facility];  Service: Endoscopy;  Laterality: N/A;  . FEMUR IM NAIL Right 09/23/2017   Procedure: INTRAMEDULLARY (IM) NAIL FEMORAL;  Surgeon: Renette Butters, MD;  Location: [place];  Service: Orthopedics;  Laterality: Right;  . FRACTURE SURGERY     Hip replacement in May 2019 - fall related   . OOPHORECTOMY      Current Medications: Outpatient Medications Prior to Visit  Medication Sig Dispense Refill  . amLODipine (NORVASC) 5 MG tablet Take 1 tablet (5 mg total) by mouth daily. 30 tablet 3  . aspirin EC 81 MG tablet Take 81 mg by mouth daily.    Marland Kitchen ezetimibe (ZETIA) 10 MG tablet Take 1 tablet (10 mg total) by mouth daily. 90 tablet 3  . fluticasone (FLONASE) 50 MCG/ACT  nasal spray SHAKE LIQUID AND USE 1 SPRAY IN EACH NOSTRIL DAILY 16 g 3  . HYDROcodone-acetaminophen (NORCO/VICODIN) 5-325 MG tablet Take 1 tablet by mouth daily as needed.  0  . hydroxychloroquine (PLAQUENIL) 200 MG tablet Take 400 mg by mouth daily.    . metoprolol succinate (TOPROL-XL) 25 MG 24 hr tablet TAKE 1 TABLET(25 MG) BY MOUTH DAILY 30 tablet 11  . montelukast (SINGULAIR) 10 MG tablet TAKE 1 TABLET(10 MG) BY MOUTH AT BEDTIME 30 tablet 4  . Multiple Vitamin (MULTIVITAMIN WITH MINERALS) TABS tablet Take 1 tablet by mouth daily.    Marland Kitchen oxybutynin (DITROPAN) 5 MG tablet TAKE 1/2 TABLET(2.5 MG) BY MOUTH TWICE DAILY 90 tablet 3  . pantoprazole (PROTONIX) 40 MG tablet TAKE 1 TABLET(40 MG) BY  MOUTH DAILY 30 tablet 5  . rosuvastatin (CRESTOR) 20 MG tablet Take one tablet by mouth every Monday, Wednesday, Friday and Sunday 48 tablet 6  . UNABLE TO FIND Under pads use as needed  Pullups use as needed   Length of need- 99 months DX urinary incontinence 100 each 11  . UNABLE TO FIND Incontinence briefs and pads Dx incontinence 100 each 11  . torsemide (DEMADEX) 20 MG tablet Take 1 tablet (20 mg total) by mouth daily. 90 tablet 3  . nicotine (NICODERM CQ - DOSED IN MG/24 HOURS) 14 mg/24hr patch Place 1 patch (14 mg total) onto the skin daily. (Patient not taking: Reported on 12/04/2019) 28 patch 1   No facility-administered medications prior to visit.     Allergies:   Ace inhibitors   Social History   Socioeconomic History  . Marital status: Married    Spouse name: Not on file  . Number of children: 3  . Years of education: 11th grade  . Highest education level: 11th grade  Occupational History  . Occupation: disabilty x 9years    Comment: secondary to arthritis   Tobacco Use  . Smoking status: Current Every Day Smoker    Packs/day: 0.50    Years: 15.00    Pack years: 7.50    Types: Cigarettes  . Smokeless tobacco: Never Used  . Tobacco comment: quit after hospitized for hip fracture   Vaping Use  . Vaping Use: Never used  Substance and Sexual Activity  . Alcohol use: No    Alcohol/week: 0.0 standard drinks    Comment: quit 6 years ago   . Drug use: No  . Sexual activity: Not on file  Other Topics Concern  . Not on file  Social History Narrative   Pt gave birth to 4 children, 1 deceased at 52 weeks was a premature baby, 3 are living ages range 56 to 66 all healthy    Social Determinants of Health   Financial Resource Strain:   . Difficulty of Paying Living Expenses:   Food Insecurity:   . Worried About Charity fundraiser in the Last Year:   . Arboriculturist in the Last Year:   Transportation Needs:   . Film/video editor (Medical):   Marland Kitchen Lack of  Transportation (Non-Medical):   Physical Activity:   . Days of Exercise per Week:   . Minutes of Exercise per Session:   Stress:   . Feeling of Stress :   Social Connections:   . Frequency of Communication with Friends and Family:   . Frequency of Social Gatherings with Friends and Family:   . Attends Religious Services:   . Active Member of Clubs or Organizations:   .  Attends Archivist Meetings:   Marland Kitchen Marital Status:      Family History:  The patient's family history includes Breast cancer in her sister; Cancer in her brother and father; Colon cancer in her maternal aunt; Heart attack in her mother.   Review of Systems:   Please see the history of present illness.     General:  No chills, fever, night sweats or weight changes.  Cardiovascular:  No chest pain, dyspnea on exertion, orthopnea, palpitations, paroxysmal nocturnal dyspnea. Positive for edema.  Dermatological: No rash, lesions/masses Respiratory: No cough, dyspnea Urologic: No hematuria, dysuria Abdominal:   No nausea, vomiting, diarrhea, bright red blood per rectum, melena, or hematemesis Neurologic:  No visual changes, wkns, changes in mental status. All other systems reviewed and are otherwise negative except as noted above.   Physical Exam:    VS:  BP 132/60   Pulse 88   Ht 5\' 7"  (1.702 m)   Wt 184 lb (83.5 kg)   SpO2 99%   BMI 28.82 kg/m    General: Well developed, well nourished,female appearing in no acute distress. Head: Normocephalic, atraumatic. Neck: No carotid bruits. JVD not elevated.  Lungs: Respirations regular and unlabored, without wheezes or rales.  Heart: Regular rate and rhythm. No S3 or S4.  No murmur, no rubs, or gallops appreciated. Abdomen: Appears non-distended. No obvious abdominal masses. Msk:  Strength and tone appear normal for age. No obvious joint deformities or effusions. Extremities: No clubbing or cyanosis. 1+ pitting edema bilaterally.  Distal pedal pulses are 2+  bilaterally. Neuro: Alert and oriented X 3. Moves all extremities spontaneously. No focal deficits noted. Psych:  Aphasic. Nods head yes/no to questions.  Skin: No rashes or lesions noted  Wt Readings from Last 3 Encounters:  12/04/19 184 lb (83.5 kg)  11/14/19 188 lb 1.9 oz (85.3 kg)  10/23/19 189 lb (85.7 kg)     Studies/Labs Reviewed:   EKG:  EKG is not ordered today.   Recent Labs: 01/22/2019: TSH 2.77 10/01/2019: ALT 55; B Natriuretic Peptide 80.0; BUN 10; Creatinine, Ser 1.08; Hemoglobin 11.2; Platelets 151; Potassium 3.7; Sodium 137   Lipid Panel    Component Value Date/Time   CHOL 126 01/22/2019 0913   TRIG 246 (H) 01/22/2019 0913   HDL 23 (L) 01/22/2019 0913   CHOLHDL 5.5 (H) 01/22/2019 0913   VLDL 42 (H) 05/04/2016 1035   LDLCALC 69 01/22/2019 0913    Additional studies/ records that were reviewed today include:   Echocardiogram: 09/2019 IMPRESSIONS    1. Left ventricular ejection fraction, by estimation, is 60 to 65%. The  left ventricle has normal function. The left ventricle has no regional  wall motion abnormalities. There is mild left ventricular hypertrophy.  Left ventricular diastolic parameters  are consistent with Grade I diastolic dysfunction (impaired relaxation).  Elevated left atrial pressure.  2. Right ventricular systolic function is normal. The right ventricular  size is normal.  3. The mitral valve is normal in structure. No evidence of mitral valve  regurgitation. No evidence of mitral stenosis.  4. The aortic valve has an indeterminant number of cusps. Aortic valve  regurgitation is mild. Mild to moderate aortic valve  sclerosis/calcification is present, without any evidence of aortic  stenosis.  5. The inferior vena cava is normal in size with greater than 50%  respiratory variability, suggesting right atrial pressure of 3 mmHg.   Assessment:    1. Bilateral leg edema   2. Medication management   3.  Coronary artery disease  involving native coronary artery of native heart without angina pectoris   4. Essential hypertension   5. Hyperlipidemia LDL goal <70      Plan:   In order of problems listed above:  1. Lower Extremity Edema - Prior echocardiogram imaging earlier this year showed a preserved EF of 60 to 65% with grade 1 diastolic dysfunction and mild LVH.  She still has lower extremity edema but this has significantly improved since her last visit and being switched from Lasix to Torsemide 20mg  daily. Weight has declined by 5 pounds. Will plan to obtain a follow-up BMET today as she has not had repeat lab work since being switched to [place].  2. CAD - She is s/p prior stenting to LAD and OM in 2005. She denies any recent anginal symptoms.  - Continue current medication regimen with ASA 81mg  daily, Zetia 10mg  daily, Toprol-XL 25mg  daily and Crestor 20mg  every other day.   3. HTN - BP is well-controlled at 132/60 during today's visit. Continue current medication regimen.   4. HLD - FLP in 01/2019 showed LDL at 69.  She remains on Zetia 10 mg daily and Crestor 20 mg every other day. She was previously intolerant to higher intensity statin therapy.     Medication Adjustments/Labs and Tests Ordered: Current medicines are reviewed at length with the patient today.  Concerns regarding medicines are outlined above.  Medication changes, Labs and Tests ordered today are listed in the Patient Instructions below. Patient Instructions  Medication Instructions:  Your physician recommends that you continue on your current medications as directed. Please refer to the Current Medication list given to you today.  *If you need a refill on your cardiac medications before your next appointment, please call your pharmacy*   Lab Work: Your physician recommends that you return for lab work in: Today   If you have labs (blood work) drawn today and your tests are completely normal, you will receive your results only  by: Marland Kitchen MyChart Message (if you have MyChart) OR . A paper copy in the mail If you have any lab test that is abnormal or we need to change your treatment, we will call you to review the results.   Testing/Procedures: NONE    Follow-Up: At [facility], you and your health needs are our priority.  As part of our continuing mission to provide you with exceptional heart care, we have created designated Provider Care Teams.  These Care Teams include your primary Cardiologist (physician) and Advanced Practice Providers (APPs -  Physician Assistants and Nurse Practitioners) who all work together to provide you with the care you need, when you need it.  We recommend signing up for the patient portal called "MyChart".  Sign up information is provided on this After Visit Summary.  MyChart is used to connect with patients for Virtual Visits (Telemedicine).  Patients are able to view lab/test results, encounter notes, upcoming appointments, etc.  Non-urgent messages can be sent to your provider as well.   To learn more about what you can do with MyChart, go to [email or web address].    Your next appointment:   6 month(s)  The format for your next appointment:   In Person  Provider:   Carlyle Dolly, MD   Other Instructions Thank you for choosing [facility]!    Signed, Erma Heritage, PA-C  12/04/2019 3:24 PM    [facility] [address] [place], [place] [address] Phone: [phone])  [phone] Fax: [phone]

## 2019-12-12 ENCOUNTER — Other Ambulatory Visit: Payer: Self-pay | Admitting: Family Medicine

## 2019-12-17 ENCOUNTER — Ambulatory Visit: Payer: Medicare Other | Admitting: Family Medicine

## 2019-12-20 ENCOUNTER — Ambulatory Visit (INDEPENDENT_AMBULATORY_CARE_PROVIDER_SITE_OTHER): Payer: Medicare Other | Admitting: Family Medicine

## 2019-12-20 ENCOUNTER — Other Ambulatory Visit: Payer: Self-pay

## 2019-12-20 ENCOUNTER — Ambulatory Visit (HOSPITAL_COMMUNITY)
Admission: RE | Admit: 2019-12-20 | Discharge: 2019-12-20 | Disposition: A | Discharge status: Home / Self Care | Payer: Medicare Other | Source: Ambulatory Visit | Attending: Family Medicine | Admitting: Family Medicine

## 2019-12-20 ENCOUNTER — Encounter: Payer: Self-pay | Admitting: Family Medicine

## 2019-12-20 VITALS — BP 136/73 | HR 80 | Resp 16 | Ht 67.0 in | Wt 184.0 lb

## 2019-12-20 DIAGNOSIS — I2510 Atherosclerotic heart disease of native coronary artery without angina pectoris: Secondary | ICD-10-CM | POA: Diagnosis not present

## 2019-12-20 DIAGNOSIS — L03115 Cellulitis of right lower limb: Secondary | ICD-10-CM | POA: Diagnosis not present

## 2019-12-20 DIAGNOSIS — M25471 Effusion, right ankle: Secondary | ICD-10-CM

## 2019-12-20 DIAGNOSIS — D649 Anemia, unspecified: Secondary | ICD-10-CM | POA: Diagnosis not present

## 2019-12-20 DIAGNOSIS — Z23 Encounter for immunization: Secondary | ICD-10-CM

## 2019-12-20 DIAGNOSIS — I739 Peripheral vascular disease, unspecified: Secondary | ICD-10-CM | POA: Insufficient documentation

## 2019-12-20 DIAGNOSIS — I10 Essential (primary) hypertension: Secondary | ICD-10-CM

## 2019-12-20 DIAGNOSIS — S91001A Unspecified open wound, right ankle, initial encounter: Secondary | ICD-10-CM

## 2019-12-20 DIAGNOSIS — F17210 Nicotine dependence, cigarettes, uncomplicated: Secondary | ICD-10-CM

## 2019-12-20 DIAGNOSIS — Z720 Tobacco use: Secondary | ICD-10-CM

## 2019-12-20 DIAGNOSIS — M7989 Other specified soft tissue disorders: Secondary | ICD-10-CM | POA: Diagnosis not present

## 2019-12-20 DIAGNOSIS — Z79899 Other long term (current) drug therapy: Secondary | ICD-10-CM | POA: Diagnosis not present

## 2019-12-20 MED ORDER — DOXYCYCLINE HYCLATE 100 MG PO TABS: 100.0000 mg | ORAL_TABLET | Freq: Two times a day (BID) | ORAL | 0 refills | Status: DC

## 2019-12-20 MED ORDER — CEFTRIAXONE SODIUM 500 MG IJ SOLR
500.0000 mg | Freq: Once | INTRAMUSCULAR | Status: AC
  Administered 2019-12-20: 500 mg via INTRAMUSCULAR

## 2019-12-20 NOTE — Patient Instructions (Addendum)
Follow-up in office to reevaluate his leg ulcer in 10 to 14 days call if you need me sooner.  Cancel September 23 follow-up visit.  Rocephin 500 mg IM in office today for leg ulcer. Flu vaccine In office today  10-day course of antibiotics is prescribed for leg ulcer.  CBC and differential today lab draw.  Please get an x-ray of the right ankle today to ensure that this is not affecting the bone.  Please stay off leg as much as possible , elevate it and try not to cause direct trauma to the leg You are referred to vascular surgery for evaluation of circulation in your legs and further management.  You need to stop smoking to help with improving circulation to the legs and healing of this ulcer.  Thanks for choosing [facility], we consider it a privelige to serve you.

## 2019-12-20 NOTE — Assessment & Plan Note (Signed)
After obtaining informed consent, the vaccine is  administered , with no adverse effect noted at the time of administration.  

## 2019-12-20 NOTE — Assessment & Plan Note (Signed)
Hyperpigmentation and ulceration of right ankle, refer vasscular, right greater than left

## 2019-12-21 ENCOUNTER — Observation Stay (HOSPITAL_COMMUNITY)
Admission: EM | Admit: 2019-12-21 | Discharge: 2019-12-23 | Disposition: A | Discharge status: Home / Self Care | Payer: Medicare Other | Attending: Internal Medicine | Admitting: Internal Medicine

## 2019-12-21 ENCOUNTER — Telehealth: Payer: Self-pay | Admitting: *Deleted

## 2019-12-21 ENCOUNTER — Other Ambulatory Visit: Payer: Self-pay

## 2019-12-21 DIAGNOSIS — Z79899 Other long term (current) drug therapy: Secondary | ICD-10-CM | POA: Diagnosis not present

## 2019-12-21 DIAGNOSIS — F17210 Nicotine dependence, cigarettes, uncomplicated: Secondary | ICD-10-CM | POA: Insufficient documentation

## 2019-12-21 DIAGNOSIS — Z20822 Contact with and (suspected) exposure to covid-19: Secondary | ICD-10-CM | POA: Insufficient documentation

## 2019-12-21 DIAGNOSIS — I69359 Hemiplegia and hemiparesis following cerebral infarction affecting unspecified side: Secondary | ICD-10-CM | POA: Diagnosis not present

## 2019-12-21 DIAGNOSIS — L03115 Cellulitis of right lower limb: Secondary | ICD-10-CM | POA: Diagnosis not present

## 2019-12-21 DIAGNOSIS — I10 Essential (primary) hypertension: Secondary | ICD-10-CM | POA: Insufficient documentation

## 2019-12-21 DIAGNOSIS — Z7982 Long term (current) use of aspirin: Secondary | ICD-10-CM | POA: Diagnosis not present

## 2019-12-21 DIAGNOSIS — E785 Hyperlipidemia, unspecified: Secondary | ICD-10-CM | POA: Diagnosis not present

## 2019-12-21 DIAGNOSIS — L97511 Non-pressure chronic ulcer of other part of right foot limited to breakdown of skin: Secondary | ICD-10-CM | POA: Diagnosis not present

## 2019-12-21 DIAGNOSIS — I69959 Hemiplegia and hemiparesis following unspecified cerebrovascular disease affecting unspecified side: Secondary | ICD-10-CM

## 2019-12-21 DIAGNOSIS — M069 Rheumatoid arthritis, unspecified: Secondary | ICD-10-CM | POA: Diagnosis not present

## 2019-12-21 DIAGNOSIS — D539 Nutritional anemia, unspecified: Secondary | ICD-10-CM | POA: Insufficient documentation

## 2019-12-21 DIAGNOSIS — L97519 Non-pressure chronic ulcer of other part of right foot with unspecified severity: Secondary | ICD-10-CM | POA: Diagnosis present

## 2019-12-21 DIAGNOSIS — N179 Acute kidney failure, unspecified: Secondary | ICD-10-CM | POA: Diagnosis not present

## 2019-12-21 DIAGNOSIS — M25571 Pain in right ankle and joints of right foot: Secondary | ICD-10-CM | POA: Diagnosis present

## 2019-12-21 DIAGNOSIS — L0390 Cellulitis, unspecified: Secondary | ICD-10-CM | POA: Diagnosis present

## 2019-12-21 DIAGNOSIS — R5381 Other malaise: Secondary | ICD-10-CM | POA: Diagnosis not present

## 2019-12-21 DIAGNOSIS — R609 Edema, unspecified: Secondary | ICD-10-CM | POA: Diagnosis not present

## 2019-12-21 LAB — CBC WITH DIFFERENTIAL/PLATELET
Absolute Monocytes: 1233 cells/uL — ABNORMAL HIGH (ref 200–950)
Basophils Absolute: 46 cells/uL (ref 0–200)
Basophils Relative: 0.5 %
Eosinophils Absolute: 46 cells/uL (ref 15–500)
Eosinophils Relative: 0.5 %
HCT: 34.2 % — ABNORMAL LOW (ref 35.0–45.0)
Hemoglobin: 11.2 g/dL — ABNORMAL LOW (ref 11.7–15.5)
Lymphs Abs: 3146 cells/uL (ref 850–3900)
MCH: 32.2 pg (ref 27.0–33.0)
MCHC: 32.7 g/dL (ref 32.0–36.0)
MCV: 98.3 fL (ref 80.0–100.0)
MPV: 11.1 fL (ref 7.5–12.5)
Monocytes Relative: 13.4 %
Neutro Abs: 4729 cells/uL (ref 1500–7800)
Neutrophils Relative %: 51.4 %
Platelets: 170 10*3/uL (ref 140–400)
RBC: 3.48 10*6/uL — ABNORMAL LOW (ref 3.80–5.10)
RDW: 12.8 % (ref 11.0–15.0)
Total Lymphocyte: 34.2 %
WBC: 9.2 10*3/uL (ref 3.8–10.8)

## 2019-12-21 LAB — BASIC METABOLIC PANEL
BUN/Creatinine Ratio: 14 (calc) (ref 6–22)
BUN: 21 mg/dL (ref 7–25)
CO2: 18 mmol/L — ABNORMAL LOW (ref 20–32)
Calcium: 9.1 mg/dL (ref 8.6–10.4)
Chloride: 109 mmol/L (ref 98–110)
Creat: 1.53 mg/dL — ABNORMAL HIGH (ref 0.50–0.99)
Glucose, Bld: 95 mg/dL (ref 65–139)
Potassium: 3.8 mmol/L (ref 3.5–5.3)
Sodium: 139 mmol/L (ref 135–146)

## 2019-12-21 MED ORDER — TORSEMIDE 20 MG PO TABS
10.0000 mg | ORAL_TABLET | Freq: Every day | ORAL | 3 refills | Status: DC
Start: 2019-12-21 — End: 2020-05-13

## 2019-12-21 NOTE — ED Triage Notes (Signed)
Patient complaint right foot pain/wound with swelling. Patient is non-verbal and has had a stroke that has paralyzed the left side.

## 2019-12-21 NOTE — Telephone Encounter (Signed)
Husband notified of test results

## 2019-12-21 NOTE — Telephone Encounter (Signed)
-----   Message from Erma Heritage, [place] sent at 12/21/2019  7:56 AM EDT ----- Please let the patient know her lab work shows her kidneys are stressed with the increased dose of Torsemide as creatinine has gone from 1.08 to 1.53. I would recommend reducing her dosing from 20 mg daily to 10 mg daily and they can cut her current tablets in half.  Repeat BMET in 2-3 weeks for reassessment.

## 2019-12-22 ENCOUNTER — Encounter: Payer: Self-pay | Admitting: Family Medicine

## 2019-12-22 ENCOUNTER — Emergency Department (HOSPITAL_COMMUNITY): Payer: Medicare Other

## 2019-12-22 ENCOUNTER — Inpatient Hospital Stay (HOSPITAL_COMMUNITY): Payer: Medicare Other

## 2019-12-22 DIAGNOSIS — L97519 Non-pressure chronic ulcer of other part of right foot with unspecified severity: Secondary | ICD-10-CM | POA: Diagnosis present

## 2019-12-22 DIAGNOSIS — L03115 Cellulitis of right lower limb: Secondary | ICD-10-CM | POA: Diagnosis not present

## 2019-12-22 DIAGNOSIS — R600 Localized edema: Secondary | ICD-10-CM | POA: Diagnosis not present

## 2019-12-22 LAB — BASIC METABOLIC PANEL
Anion gap: 11 (ref 5–15)
Anion gap: 9 (ref 5–15)
BUN: 22 mg/dL (ref 8–23)
BUN: 18 mg/dL (ref 8–23)
CO2: 17 mmol/L — ABNORMAL LOW (ref 22–32)
CO2: 17 mmol/L — ABNORMAL LOW (ref 22–32)
Calcium: 9.1 mg/dL (ref 8.9–10.3)
Calcium: 8.7 mg/dL — ABNORMAL LOW (ref 8.9–10.3)
Chloride: 108 mmol/L (ref 98–111)
Chloride: 114 mmol/L — ABNORMAL HIGH (ref 98–111)
Creatinine, Ser: 1.61 mg/dL — ABNORMAL HIGH (ref 0.44–1.00)
Creatinine, Ser: 1.23 mg/dL — ABNORMAL HIGH (ref 0.44–1.00)
GFR calc Af Amer: 39 mL/min — ABNORMAL LOW (ref >60)
GFR calc Af Amer: 54 mL/min — ABNORMAL LOW (ref >60)
GFR calc non Af Amer: 33 mL/min — ABNORMAL LOW (ref >60)
GFR calc non Af Amer: 46 mL/min — ABNORMAL LOW (ref >60)
Glucose, Bld: 104 mg/dL — ABNORMAL HIGH (ref 70–99)
Glucose, Bld: 91 mg/dL (ref 70–99)
Potassium: 3.6 mmol/L (ref 3.5–5.1)
Potassium: 3.7 mmol/L (ref 3.5–5.1)
Sodium: 136 mmol/L (ref 135–145)
Sodium: 140 mmol/L (ref 135–145)

## 2019-12-22 LAB — SEDIMENTATION RATE: Sed Rate: 72 mm/hr — ABNORMAL HIGH (ref 0–22)

## 2019-12-22 LAB — CBC WITH DIFFERENTIAL/PLATELET
Abs Immature Granulocytes: 0.02 10*3/uL (ref 0.00–0.07)
Basophils Absolute: 0 10*3/uL (ref 0.0–0.1)
Basophils Relative: 0 %
Eosinophils Absolute: 0.1 10*3/uL (ref 0.0–0.5)
Eosinophils Relative: 2 %
HCT: 36.6 % (ref 36.0–46.0)
Hemoglobin: 11.6 g/dL — ABNORMAL LOW (ref 12.0–15.0)
Immature Granulocytes: 0 %
Lymphocytes Relative: 34 %
Lymphs Abs: 2.9 10*3/uL (ref 0.7–4.0)
MCH: 32.3 pg (ref 26.0–34.0)
MCHC: 31.7 g/dL (ref 30.0–36.0)
MCV: 101.9 fL — ABNORMAL HIGH (ref 80.0–100.0)
Monocytes Absolute: 0.9 10*3/uL (ref 0.1–1.0)
Monocytes Relative: 10 %
Neutro Abs: 4.5 10*3/uL (ref 1.7–7.7)
Neutrophils Relative %: 54 %
Platelets: 177 10*3/uL (ref 150–400)
RBC: 3.59 MIL/uL — ABNORMAL LOW (ref 3.87–5.11)
RDW: 15 % (ref 11.5–15.5)
WBC: 8.2 10*3/uL (ref 4.0–10.5)
nRBC: 0 % (ref 0.0–0.2)

## 2019-12-22 LAB — LACTIC ACID, PLASMA
Lactic Acid, Venous: 1.1 mmol/L (ref 0.5–1.9)
Lactic Acid, Venous: 1.2 mmol/L (ref 0.5–1.9)

## 2019-12-22 LAB — SARS CORONAVIRUS 2 BY RT PCR (HOSPITAL ORDER, PERFORMED IN ~~LOC~~ HOSPITAL LAB): SARS Coronavirus 2: NEGATIVE

## 2019-12-22 LAB — CBG MONITORING, ED
Glucose-Capillary: 91 mg/dL (ref 70–99)
Glucose-Capillary: 84 mg/dL (ref 70–99)
Glucose-Capillary: 84 mg/dL (ref 70–99)

## 2019-12-22 LAB — C-REACTIVE PROTEIN: CRP: 1.4 mg/dL — ABNORMAL HIGH (ref <1.0)

## 2019-12-22 LAB — HEMOGLOBIN A1C
Hgb A1c MFr Bld: 6.1 % — ABNORMAL HIGH (ref 4.8–5.6)
Mean Plasma Glucose: 128.37 mg/dL

## 2019-12-22 MED ORDER — ONDANSETRON HCL 4 MG PO TABS: 4.0000 mg | ORAL_TABLET | Freq: Four times a day (QID) | ORAL | Status: DC | PRN

## 2019-12-22 MED ORDER — ONDANSETRON HCL 4 MG/2ML IJ SOLN: 4.0000 mg | Freq: Four times a day (QID) | INTRAMUSCULAR | Status: DC | PRN

## 2019-12-22 MED ORDER — ACETAMINOPHEN 325 MG PO TABS: 650.0000 mg | ORAL_TABLET | Freq: Four times a day (QID) | ORAL | Status: DC | PRN

## 2019-12-22 MED ORDER — SODIUM CHLORIDE 0.9 % IV SOLN
1.0000 g | Freq: Once | INTRAVENOUS | Status: AC
  Administered 2019-12-22: 1 g via INTRAVENOUS
  Filled 2019-12-22: qty 1

## 2019-12-22 MED ORDER — VANCOMYCIN HCL 1500 MG/300ML IV SOLN
1500.0000 mg | INTRAVENOUS | Status: AC
  Administered 2019-12-22: 1500 mg via INTRAVENOUS
  Filled 2019-12-22: qty 300

## 2019-12-22 MED ORDER — VANCOMYCIN HCL IN DEXTROSE 1-5 GM/200ML-% IV SOLN
1000.0000 mg | INTRAVENOUS | Status: DC
  Administered 2019-12-23: 1000 mg via INTRAVENOUS
  Filled 2019-12-22: qty 200

## 2019-12-22 MED ORDER — SODIUM CHLORIDE 0.9 % IV SOLN
1.0000 g | Freq: Two times a day (BID) | INTRAVENOUS | Status: DC
  Administered 2019-12-22 – 2019-12-23 (×2): 1 g via INTRAVENOUS
  Filled 2019-12-22 (×2): qty 1

## 2019-12-22 MED ORDER — SODIUM CHLORIDE 0.9 % IV SOLN: INTRAVENOUS | Status: DC

## 2019-12-22 MED ORDER — IOHEXOL 300 MG/ML  SOLN
75.0000 mL | Freq: Once | INTRAMUSCULAR | Status: AC | PRN
  Administered 2019-12-22: 75 mL via INTRAVENOUS

## 2019-12-22 MED ORDER — ACETAMINOPHEN 650 MG RE SUPP: 650.0000 mg | Freq: Four times a day (QID) | RECTAL | Status: DC | PRN

## 2019-12-22 MED ORDER — VANCOMYCIN HCL 1500 MG/300ML IV SOLN: 1500.0000 mg | INTRAVENOUS | Status: DC

## 2019-12-22 MED ORDER — SODIUM CHLORIDE 0.9 % IV BOLUS
1000.0000 mL | Freq: Once | INTRAVENOUS | Status: AC
  Administered 2019-12-22: 1000 mL via INTRAVENOUS

## 2019-12-22 MED ORDER — INSULIN ASPART 100 UNIT/ML ~~LOC~~ SOLN: 0.0000 [IU] | Freq: Three times a day (TID) | SUBCUTANEOUS | Status: DC

## 2019-12-22 MED ORDER — HEPARIN SODIUM (PORCINE) 5000 UNIT/ML IJ SOLN
5000.0000 [IU] | Freq: Three times a day (TID) | INTRAMUSCULAR | Status: DC
  Administered 2019-12-22 – 2019-12-23 (×4): 5000 [IU] via SUBCUTANEOUS
  Filled 2019-12-22 (×4): qty 1

## 2019-12-22 MED ORDER — PIPERACILLIN-TAZOBACTAM 3.375 G IVPB 30 MIN
3.3750 g | Freq: Once | INTRAVENOUS | Status: AC
  Administered 2019-12-22: 3.375 g via INTRAVENOUS
  Filled 2019-12-22: qty 50

## 2019-12-22 NOTE — Progress Notes (Signed)
No charge note, patient seen and examined this morning, admitted overnight, H&P reviewed and agree with the assessment and plan.  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  Right ankle wound, cellulitis -started on vancomycin and meropenem, continue.  Is quite tender, cannot really rule out a deeper abscess and will obtain a CT scan of the ankle   Scheduled Meds:  heparin  5,000 Units Subcutaneous Q8H   insulin aspart  0-15 Units Subcutaneous TID WC   Continuous Infusions:  sodium chloride 125 mL/hr at 12/22/19 0713   meropenem (MERREM) IV     [START ON 12/23/2019] vancomycin     PRN Meds:.acetaminophen **OR** acetaminophen, ondansetron **OR** ondansetron (ZOFRAN) IV  Tymika Grilli M. Cruzita Lederer, MD, PhD [facility]  Between 7 am - 7 pm you can contact me via [place] or [place].  I am not available 7 pm - 7 am, please contact night coverage MD/APP via Amion

## 2019-12-22 NOTE — ED Provider Notes (Signed)
[facility] EMERGENCY DEPARTMENT Provider Note   CSN: [id number] Arrival date & time: 12/21/19  2327     History Chief Complaint  Patient presents with  . Foot Pain    Danielle Cruz is a 64 y.o. female.  Patient nonverbal from previous stroke.  Husband gives history.  Wound to right lateral malleolus for the past 3 weeks.  Denies any trauma.  States that is there from "swelling".  Is been bleeding and draining.  Went to PCP yesterday given a shot of penicillin and started on doxycycline.  Comes in today with worsening pain.  Denies any fevers, chills, nausea, vomiting.  No change in mental status.  No trauma.  She is not a diabetic.  Husband concerned she is having increased pain in the leg.  The right leg is always more swollen compared to the left.  No chest pain or shortness of breath.  No abdominal pain.  The history is provided by the patient and the spouse. The history is limited by the condition of the patient.  Foot Pain       Past Medical History:  Diagnosis Date  . AKI (acute kidney injury) ([place]) 09/22/2017  . Anemia   . 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 Arthritis   . CHF (congestive heart failure) ([place]) 10/02/2019  . Colonic polyp 2010   Hemorrhoids; h/o mild hematochezia  . CVA (cerebral infarction) 08/2008   sizable left -residual expressive aphasia, right sided weakness; ambulates with difficulty with the right leg brace   . Hyperlipidemia   . Hypertension 2005  . Rheumatoid arthritis(714.0)   . Stroke [facility])     Patient Active Problem List   Diagnosis Date Noted  . PVD (peripheral vascular disease) ([place]) 12/20/2019  . Incontinence in female 10/05/2019  . Urinary incontinence in female 10/02/2019  . CHF (congestive heart failure) ([place]) 10/02/2019  . Diverticulosis of colon 02/04/2018  . Hemorrhoid 02/04/2018  . FH: breast cancer in relative when <71 years old 01/25/2018  .  Hematochezia   . GI bleed 10/03/2017  . Hip fracture ([place]) 09/22/2017  . At high risk for falls 01/31/2017  . Disorder of bone and cartilage 05/11/2016  . Normocytic anemia 01/08/2015  . Elevated ferritin 01/08/2015  . CAD in native artery 12/07/2014  . Need for influenza vaccination 05/10/2014  . Fasting hyperglycemia 11/05/2011  . Elevated transaminase level 01/08/2011  . Tobacco abuse   . Rheumatoid arthritis ([place])   . Colonic polyp   . 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 Cardiovascular disease 01/14/2010  . Anemia 04/01/2009  . Hyperlipemia 11/14/2008  . Essential hypertension 11/14/2008    Past Surgical History:  Procedure Laterality Date  . ANKLE SURGERY     Right  . CAROTID STENT INSERTION    . COLONOSCOPY W/ POLYPECTOMY  11/2008   Dr. Gala Romney. Left-sided diverticula, Pedunculated polyp snared (no adenomatous changes)  . COLONOSCOPY WITH PROPOFOL N/A 01/12/2018   Procedure: COLONOSCOPY WITH PROPOFOL;  Surgeon: Daneil Dolin, MD;  [facility];  Service: Endoscopy;  Laterality: N/A;  . FEMUR IM NAIL Right 09/23/2017   Procedure: INTRAMEDULLARY (IM) NAIL FEMORAL;  Surgeon: Renette Butters, MD;  Location: [place];  Service: Orthopedics;  Laterality: Right;  . FRACTURE SURGERY     Hip replacement in May 2019 - fall related   . OOPHORECTOMY  OB History   No obstetric history on file.     Family History  Problem Relation Age of Onset  . Cancer Father        prostate, deceased 17s  . Breast cancer Sister        Deceased 59s  . Heart attack Mother        deceased 63, during childbirth  . Cancer Brother        lymphoma  . Colon cancer Maternal Aunt        older than age 11  . Liver disease Neg Hx     Social History   Tobacco Use  . Smoking status: Current Every Day Smoker    Packs/day: 0.50    Years: 15.00    Pack years: 7.50    Types: Cigarettes  . Smokeless tobacco: Never Used  . Tobacco comment: quit  after hospitized for hip fracture   Vaping Use  . Vaping Use: Never used  Substance Use Topics  . Alcohol use: No    Alcohol/week: 0.0 standard drinks    Comment: quit 6 years ago   . Drug use: No    Home Medications Prior to Admission medications   Medication Sig Start Date End Date Taking? Authorizing Provider  amLODipine (NORVASC) 5 MG tablet Take 1 tablet (5 mg total) by mouth daily. 11/14/19   Fayrene Helper, MD  aspirin EC 81 MG tablet Take 81 mg by mouth daily.    [provider]  doxycycline (VIBRA-TABS) 100 MG tablet Take 1 tablet (100 mg total) by mouth 2 (two) times daily. 12/20/19   Fayrene Helper, MD  ezetimibe (ZETIA) 10 MG tablet Take 1 tablet (10 mg total) by mouth daily. 01/24/19   Fayrene Helper, MD  fluticasone (FLONASE) 50 MCG/ACT nasal spray SHAKE LIQUID AND USE 1 SPRAY IN EACH NOSTRIL DAILY 08/23/18   Fayrene Helper, MD  HYDROcodone-acetaminophen (NORCO/VICODIN) 5-325 MG tablet Take 1 tablet by mouth daily as needed. 12/16/17   [provider]  hydroxychloroquine (PLAQUENIL) 200 MG tablet Take 400 mg by mouth daily. 08/14/19   [provider]  metoprolol succinate (TOPROL-XL) 25 MG 24 hr tablet TAKE 1 TABLET(25 MG) BY MOUTH DAILY 06/18/19   Arnoldo Lenis, MD  montelukast (SINGULAIR) 10 MG tablet TAKE 1 TABLET(10 MG) BY MOUTH AT BEDTIME 10/19/19   Fayrene Helper, MD  Multiple Vitamin (MULTIVITAMIN WITH MINERALS) TABS tablet Take 1 tablet by mouth daily.    [provider]  nicotine (NICODERM CQ - DOSED IN MG/24 HOURS) 14 mg/24hr patch Place 1 patch (14 mg total) onto the skin daily. 10/02/19   Fayrene Helper, MD  oxybutynin (DITROPAN) 5 MG tablet TAKE 1/2 TABLET(2.5 MG) BY MOUTH TWICE DAILY 08/06/19   Fayrene Helper, MD  pantoprazole (PROTONIX) 40 MG tablet TAKE 1 TABLET(40 MG) BY MOUTH DAILY 12/12/19   Fayrene Helper, MD  rosuvastatin (CRESTOR) 20 MG tablet Take one tablet by mouth every Monday,  Wednesday, Friday and Sunday 02/19/19   Arnoldo Lenis, MD  torsemide (DEMADEX) 20 MG tablet Take 0.5 tablets (10 mg total) by mouth daily. 12/21/19 03/20/20  Strader, Fransisco Hertz, PA-C  UNABLE TO FIND Under pads use as needed  Pullups use as needed   Length of need- 99 months DX urinary incontinence 01/02/19   Fayrene Helper, MD  UNABLE TO FIND Incontinence briefs and pads Dx incontinence 10/02/19   Fayrene Helper, MD    Allergies  Ace inhibitors  Review of Systems   Review of Systems  Unable to perform ROS: Patient nonverbal    Physical Exam Updated Vital Signs BP (!) 163/55 (BP Location: Left Arm)   Pulse 64   Temp 99.3 F (37.4 C) (Oral)   Resp 18   Ht 5\' 6"  (1.676 m)   Wt 81.6 kg   SpO2 100%   BMI 29.05 kg/m   Physical Exam Vitals and nursing note reviewed.  Constitutional:      General: She is not in acute distress.    Appearance: She is well-developed.     Comments: Nonverbal.  Right-sided weakness at baseline  HENT:     Head: Normocephalic and atraumatic.     Mouth/Throat:     Pharynx: No oropharyngeal exudate.  Eyes:     Conjunctiva/sclera: Conjunctivae normal.     Pupils: Pupils are equal, round, and reactive to light.  Neck:     Comments: No meningismus. Cardiovascular:     Rate and Rhythm: Normal rate and regular rhythm.     Heart sounds: Normal heart sounds. No murmur heard.   Pulmonary:     Effort: Pulmonary effort is normal. No respiratory distress.     Breath sounds: Normal breath sounds.  Abdominal:     Palpations: Abdomen is soft.     Tenderness: There is no abdominal tenderness. There is no guarding or rebound.  Musculoskeletal:        General: Swelling, tenderness and signs of injury present. Normal range of motion.     Cervical back: Normal range of motion and neck supple.     Comments: 2 cm ulceration over the right lateral malleolus with surrounding erythema.  There is a maggots in the wound. Foul-smelling purulent drainage  with surrounding induration and erythema.  Intact DP and PT pulse with Doppler  Skin:    General: Skin is warm.     Findings: Lesion present.  Neurological:     Mental Status: She is alert.     Cranial Nerves: No cranial nerve deficit.     Motor: No abnormal muscle tone.     Coordination: Coordination normal.     Comments: Right hemiparesis at baseline.  Moves left side.  Psychiatric:        Behavior: Behavior normal.       ED Results / Procedures / Treatments   Labs (all labs ordered are listed, but only abnormal results are displayed) Labs Reviewed  CBC WITH DIFFERENTIAL/PLATELET - Abnormal; Notable for the following components:      Result Value   RBC 3.59 (*)    Hemoglobin 11.6 (*)    MCV 101.9 (*)    All other components within normal limits  BASIC METABOLIC PANEL - Abnormal; Notable for the following components:   CO2 17 (*)    Glucose, Bld 104 (*)    Creatinine, Ser 1.61 (*)    GFR calc non Af Amer 33 (*)    GFR calc Af Amer 39 (*)    All other components within normal limits  SARS CORONAVIRUS 2 BY RT PCR (HOSPITAL ORDER, [place] [facility])  CULTURE, BLOOD (ROUTINE X 2)  CULTURE, BLOOD (ROUTINE X 2)  LACTIC ACID, PLASMA  LACTIC ACID, PLASMA  SEDIMENTATION RATE  C-REACTIVE PROTEIN  HEMOGLOBIN A1C    EKG None  Radiology DG Ankle Complete Right  Result Date: 12/21/2019 CLINICAL DATA:  Right ankle swelling for several months without known injury. Possible ulceration. EXAM: RIGHT ANKLE -  COMPLETE 3+ VIEW COMPARISON:  None. FINDINGS: There is no evidence of fracture, dislocation, or joint effusion. No lytic destruction is seen to suggest osteomyelitis. There appears to be fusion of the talotibial joint which may be due to degenerative disease. Soft tissue swelling is seen medially and laterally, with probable ulceration noted laterally. IMPRESSION: No fracture or dislocation is noted. Soft tissue swelling is seen medially and laterally, with  probable ulceration noted laterally. Electronically Signed   By: Marijo Conception M.D.   On: 12/21/2019 09:34    Procedures Procedures (including critical care time)  Medications Ordered in ED Medications - No data to display  ED Course  I have reviewed the triage vital signs and the nursing notes.  Pertinent labs & imaging results that were available during my care of the patient were reviewed by me and considered in my medical decision making (see chart for details).    MDM Rules/Calculators/A&P                         Foot ulceration for the past 3 weeks.  Here with increased pain and swelling.  Started on antibiotics yesterday.  Pulses intact distally  X-ray done yesterday shows soft tissue swelling without bony destruction. Labs are reassuring. Temperature 99.3 on arrival. No leukocytosis. Mild AKI. Patient started on broad-spectrum antibiotics.  Concern for progressive worsening foot ulcer.  Patient has AKI as well.  No leukocytosis or fever.  Will obtain cultures, send informatory markers.  Start broad-spectrum antibiotics. Distal pulses are intact.  Admission discussed with Dr. Olevia Bowens Final Clinical Impression(s) / ED Diagnoses Final diagnoses:  None    Rx / DC Orders ED Discharge Orders    None       Duy Lemming, Annie Main, MD 12/22/19 [phone]

## 2019-12-22 NOTE — Progress Notes (Addendum)
Pharmacy Antibiotic Note  Danielle Cruz is a 64 y.o. female admitted on 12/21/2019 with R foot cellulitis.  Pharmacy has been consulted for Vancomycin dosing.  Plan: Vancomycin 1500mg  IV q24h  Will f/u renal function, micro data, and pt's clinical condition Vanc levels prn  Addendum (1587): Adding meropenem. Meropenem 1gm IV q12h   Height: 5\' 6"  (167.6 cm) Weight: 81.6 kg (180 lb) IBW/kg (Calculated) : 59.3  Temp (24hrs), Avg:99.3 F (37.4 C), Min:99.3 F (37.4 C), Max:99.3 F (37.4 C)  Recent Labs  Lab 12/20/19 1406 12/20/19 1407  WBC 9.2  --   CREATININE  --  1.53*    Estimated Creatinine Clearance: 40 mL/min (A) (by C-G formula based on SCr of 1.53 mg/dL (H)).    Allergies  Allergen Reactions   Ace Inhibitors Cough    New daily cough since starting ACE    Antimicrobials this admission: 9/4 Vanc >>  9/4 Meropenem>> 9/4  Zosyn x 1  Microbiology results: Pending  Thank you for allowing pharmacy to be a part of this patients care.  Sherlon Handing, PharmD, BCPS Please see amion for complete clinical pharmacist phone list9/07/2019 1:03 AM

## 2019-12-22 NOTE — Progress Notes (Signed)
   Danielle Cruz     MRN: [id number]      DOB: 17-Jan-1956   HPI history is from spouse, pt has aphasia Ms. Ivanov is here with a 3 week h/o pain and swellig of right foot and ankle, also notes some left leg swelling. No h/o trauma , swelling has gradually increased. No fever, chills or change inappetite   ROS Denies recent fever or chills. Denies sinus pressure, nasal congestion, ear pain or sore throat. Denies chest congestion, productive cough or wheezing. Denies chest pains, palpitations  Denies abdominal pain, nausea, vomiting,diarrhea or constipation.   Denies dysuria,  chronic joint pain, and limitation in mobility.  PE  BP 136/73   Pulse 80   Resp 16   Ht 5\' 7"  (1.702 m)   Wt 184 lb (83.5 kg)   SpO2 98%   BMI 28.82 kg/m   Patient alert  and in no cardiopulmonary distress.  HEENT: No facial asymmetry, EOMI,     Neck supple .  Chest: Clear to auscultation bilaterally.  CVS: S1, S2 no murmurs, no S3.Regular rate.Decreased DP bilaterally  ABD: Soft non tender.   Ext: bilateral lower ext edema, 3 plus on right distal leg, ankle and foot  [id number]  ROM spine, shoulders, hips and knees.  Skin: .hyperpigmentation of lower ext with ulcer on right ankle CNS: CN 2-12 intact,right hemiplegia  Assessment & Plan  PVD (peripheral vascular disease) ([place]) Hyperpigmentation and ulceration of right ankle, refer vasscular, right greater than left  Need for influenza vaccination After obtaining informed consent, the vaccine is  administered , with no adverse effect noted at the time of administration.   Cellulitis of right ankle 3 week history, Rocephin IM followed by antibiotic course, with close follow up X ray of ankle and basic lab, CBC  Essential hypertension Controlled, no change in medication   Tobacco abuse Asked:confirms currently smokes cigarettes Assess: Unwilling to set a quit date, but is cutting back Advise: needs to QUIT to reduce risk of cancer,  cardio and cerebrovascular disease Assist: counseled for 5 minutes and literature provided Arrange: follow up in 2 to 4 months

## 2019-12-22 NOTE — Assessment & Plan Note (Signed)
Asked:confirms currently smokes cigarettes °Assess: Unwilling to set a quit date, but is cutting back °Advise: needs to QUIT to reduce risk of cancer, cardio and cerebrovascular disease °Assist: counseled for 5 minutes and literature provided °Arrange: follow up in 2 to 4 months ° °

## 2019-12-22 NOTE — Assessment & Plan Note (Addendum)
3 week history, Rocephin IM followed by antibiotic course, with close follow up X ray of ankle and basic lab, CBC

## 2019-12-22 NOTE — Assessment & Plan Note (Signed)
Controlled, no change in medication  

## 2019-12-22 NOTE — H&P (Signed)
History and Physical    Danielle Cruz PJK:[id number] DOB: 1956-01-15 DOA: 12/21/2019  PCP: Fayrene Helper, MD   Patient coming from: Home.  I have personally briefly reviewed patient's old medical records in [place]  Chief Complaint: Right ankle pain and wound.  HPI: Danielle Cruz is a 64 y.o. female with medical history significant of history of AKI, unspecified anemia, ASCVD, chronic diastolic heart failure, colonic polyp, history of hemorrhoids, history of other non-hemorrhagic CVA with residual right-sided hemiparesis, hyperlipidemia, hypertension, rheumatoid arthritis who is coming to the emergency department due to right ankle pain due to open wound. She is nonverbal after having her stroke and unable to provide further history.  ED Course: Initial vital signs were temperature 99.3, pulse 64, respiration 18, blood pressure 163/55 mmHg O2 sat 100% on room air. The patient received vancomycin and Zosyn in the emergency department.  White count is 8.2, hemoglobin 11.6 g/dL and platelets 177. Lactic acid was normal twice. Her sed rate was 72 mm/h. BMP shows normal electrolytes, except for a CO2 of 17 mmol/L with a normal anion gap. Glucose 104, BUN 22 and creatinine 1.61 mg/dL. She has a normal baseline creatinine.  Imaging: Right ankle x-ray did not show any fracture or dislocation. There is soft tissue swelling seen medially and laterally with probable lateral ulceration.  Review of Systems: As per HPI otherwise all other systems reviewed and are negative.  Past Medical History:  Diagnosis Date  . AKI (acute kidney injury) ([place]) 09/22/2017  . Anemia   . 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 Arthritis   . CHF (congestive heart failure) ([place]) 10/02/2019  . Colonic polyp 2010   Hemorrhoids; h/o mild hematochezia  . CVA (cerebral infarction) 08/2008   sizable left -residual expressive aphasia, right sided  weakness; ambulates with difficulty with the right leg brace   . Hyperlipidemia   . Hypertension 2005  . Rheumatoid arthritis(714.0)   . Stroke [facility])    Past Surgical History:  Procedure Laterality Date  . ANKLE SURGERY     Right  . CAROTID STENT INSERTION    . COLONOSCOPY W/ POLYPECTOMY  11/2008   Dr. Gala Romney. Left-sided diverticula, Pedunculated polyp snared (no adenomatous changes)  . COLONOSCOPY WITH PROPOFOL N/A 01/12/2018   Procedure: COLONOSCOPY WITH PROPOFOL;  Surgeon: Daneil Dolin, MD;  [facility];  Service: Endoscopy;  Laterality: N/A;  . FEMUR IM NAIL Right 09/23/2017   Procedure: INTRAMEDULLARY (IM) NAIL FEMORAL;  Surgeon: Renette Butters, MD;  Location: [place];  Service: Orthopedics;  Laterality: Right;  . FRACTURE SURGERY     Hip replacement in May 2019 - fall related   . OOPHORECTOMY     Social History  reports that she has been smoking cigarettes. She has a 7.50 pack-year smoking history. She has never used smokeless tobacco. She reports that she does not drink alcohol and does not use drugs.  Allergies  Allergen Reactions  . Ace Inhibitors Cough    New daily cough since starting ACE   Family History  Problem Relation Age of Onset  . Cancer Father        prostate, deceased 83s  . Breast cancer Sister        Deceased 69s  . Heart attack Mother        deceased 70, during childbirth  . Cancer Brother  lymphoma  . Colon cancer Maternal Aunt        older than age 42  . Liver disease Neg Hx    Prior to Admission medications   Medication Sig Start Date End Date Taking? Authorizing Provider  amLODipine (NORVASC) 5 MG tablet Take 1 tablet (5 mg total) by mouth daily. 11/14/19   Fayrene Helper, MD  aspirin EC 81 MG tablet Take 81 mg by mouth daily.    [provider]  doxycycline (VIBRA-TABS) 100 MG tablet Take 1 tablet (100 mg total) by mouth 2 (two) times daily. 12/20/19   Fayrene Helper, MD  ezetimibe (ZETIA) 10 MG tablet Take  1 tablet (10 mg total) by mouth daily. 01/24/19   Fayrene Helper, MD  fluticasone (FLONASE) 50 MCG/ACT nasal spray SHAKE LIQUID AND USE 1 SPRAY IN EACH NOSTRIL DAILY 08/23/18   Fayrene Helper, MD  HYDROcodone-acetaminophen (NORCO/VICODIN) 5-325 MG tablet Take 1 tablet by mouth daily as needed. 12/16/17   [provider]  hydroxychloroquine (PLAQUENIL) 200 MG tablet Take 400 mg by mouth daily. 08/14/19   [provider]  metoprolol succinate (TOPROL-XL) 25 MG 24 hr tablet TAKE 1 TABLET(25 MG) BY MOUTH DAILY 06/18/19   Arnoldo Lenis, MD  montelukast (SINGULAIR) 10 MG tablet TAKE 1 TABLET(10 MG) BY MOUTH AT BEDTIME 10/19/19   Fayrene Helper, MD  Multiple Vitamin (MULTIVITAMIN WITH MINERALS) TABS tablet Take 1 tablet by mouth daily.    [provider]  nicotine (NICODERM CQ - DOSED IN MG/24 HOURS) 14 mg/24hr patch Place 1 patch (14 mg total) onto the skin daily. 10/02/19   Fayrene Helper, MD  oxybutynin (DITROPAN) 5 MG tablet TAKE 1/2 TABLET(2.5 MG) BY MOUTH TWICE DAILY 08/06/19   Fayrene Helper, MD  pantoprazole (PROTONIX) 40 MG tablet TAKE 1 TABLET(40 MG) BY MOUTH DAILY 12/12/19   Fayrene Helper, MD  rosuvastatin (CRESTOR) 20 MG tablet Take one tablet by mouth every Monday, Wednesday, Friday and Sunday 02/19/19   Arnoldo Lenis, MD  torsemide (DEMADEX) 20 MG tablet Take 0.5 tablets (10 mg total) by mouth daily. 12/21/19 03/20/20  Strader, Fransisco Hertz, PA-C  UNABLE TO FIND Under pads use as needed  Pullups use as needed   Length of need- 99 months DX urinary incontinence 01/02/19   Fayrene Helper, MD  UNABLE TO FIND Incontinence briefs and pads Dx incontinence 10/02/19   Fayrene Helper, MD    Physical Exam: Vitals:   12/21/19 2341 12/21/19 2342 12/22/19 0230  BP: (!) 163/55  (!) 156/68  Pulse: 64  60  Resp: 18  (!) 21  Temp: 99.3 F (37.4 C)    TempSrc: Oral    SpO2: 100%  100%  Weight:  81.6 kg   Height:  5\' 6"  (1.676 m)      Constitutional: NAD, calm, comfortable Eyes: PERRL, lids and conjunctivae normal ENMT: Mucous membranes are moist. Posterior pharynx clear of any exudate or lesions. Neck: normal, supple, no masses, no thyromegaly Respiratory: clear to auscultation bilaterally, no wheezing, no crackles. Normal respiratory effort. No accessory muscle use.  Cardiovascular: Regular rate and rhythm, no murmurs / rubs / gallops. Unilateral right sided lower extremity edema. 2+ pedal pulses. No carotid bruits.  Abdomen: Nondistended. Obese. Soft, no tenderness, no masses palpated. No hepatosplenomegaly. Bowel sounds positive.  Musculoskeletal: no clubbing / cyanosis. Good ROM, no contractures. Normal muscle tone.  Skin: Right lateral malleolar ulceration with surrounding erythema, edema, calor, foul-smelling discharge and  initially there were maggots in the wound as described by Dr. Wyvonnia Dusky. Please see picture below. Neurologic: CN 2-12 grossly intact. Sensation intact, DTR normal. Right sided hemiparesis. Psychiatric: Normal judgment and insight. Alert and oriented x 3. Normal mood.       Labs on Admission: I have personally reviewed following labs and imaging studies  CBC: Recent Labs  Lab 12/20/19 1406 12/22/19 0115  WBC 9.2 8.2  NEUTROABS 4,729 4.5  HGB 11.2* 11.6*  HCT 34.2* 36.6  MCV 98.3 101.9*  PLT 170 096   Basic Metabolic Panel: Recent Labs  Lab 12/20/19 1407 12/22/19 0115  NA 139 136  K 3.8 3.6  CL 109 108  CO2 18* 17*  GLUCOSE 95 104*  BUN 21 22  CREATININE 1.53* 1.61*  CALCIUM 9.1 9.1   GFR: Estimated Creatinine Clearance: 38 mL/min (A) (by C-G formula based on SCr of 1.61 mg/dL (H)).  Liver Function Tests: No results for input(s): AST, ALT, ALKPHOS, BILITOT, PROT, ALBUMIN in the last 168 hours.  Radiological Exams on Admission: DG Ankle Complete Right  Result Date: 12/21/2019 CLINICAL DATA:  Right ankle swelling for several months without known injury. Possible  ulceration. EXAM: RIGHT ANKLE - COMPLETE 3+ VIEW COMPARISON:  None. FINDINGS: There is no evidence of fracture, dislocation, or joint effusion. No lytic destruction is seen to suggest osteomyelitis. There appears to be fusion of the talotibial joint which may be due to degenerative disease. Soft tissue swelling is seen medially and laterally, with probable ulceration noted laterally. IMPRESSION: No fracture or dislocation is noted. Soft tissue swelling is seen medially and laterally, with probable ulceration noted laterally. Electronically Signed   By: Marijo Conception M.D.   On: 12/21/2019 09:34    EKG: Independently reviewed.   Assessment/Plan Principal Problem:   Cellulitis of right ankle Admit to telemetry/inpatient. Analgesics as needed. Continue local care. Consult wound/ostomy care. Vancomycin per pharmacy. Meropenem per pharmacy. Consider surgical evaluation.  Active Problems:   AKI (acute kidney injury) ([place]) Continue IVF. Monitor intake and output. Follow renal function electrolytes.    Hyperlipidemia Continue Crestor four times a week.    Macrocytic anemia Monitor H&H.    Hemiplegia, late effect of cerebrovascular disease ([place]) Supportive care.    Rheumatoid arthritis (HCC) Continue Plaquenil.    Hypertension Continue metoprolol and amlodipine after med reconciliation performed   DVT prophylaxis: Heparin SQ. Code Status:   Full code. Family Communication:   Disposition Plan:   Patient is from:  Home.  Anticipated DC to:  Home.  Anticipated DC date:  12/24/2019.  Anticipated DC barriers: Clinical condition.  Consults called: Admission status:  Inpatient/telemetry.  Severity of Illness:  Reubin Milan MD [facility]  How to contact the [facility] Attending or Consulting provider [place] or covering provider during after hours [place], for this patient?   1. Check the care team in [facility] and look for a) attending/consulting [facility] provider listed and b)  the [facility] team listed 2. Log into [email or web address] and use [place]'s universal password to access. If you do not have the password, please contact the hospital operator. 3. Locate the [facility] provider you are looking for under [facility] and page to a number that you can be directly reached. 4. If you still have difficulty reaching the provider, please page the [facility] (Director on Call) for the Hospitalists listed on amion for assistance.  12/22/2019, 4:05 AM   This document was prepared using Dragon voice recognition software and may  contain some unintended transcription errors.

## 2019-12-23 DIAGNOSIS — E785 Hyperlipidemia, unspecified: Secondary | ICD-10-CM | POA: Diagnosis not present

## 2019-12-23 DIAGNOSIS — D539 Nutritional anemia, unspecified: Secondary | ICD-10-CM

## 2019-12-23 DIAGNOSIS — L03115 Cellulitis of right lower limb: Secondary | ICD-10-CM | POA: Diagnosis not present

## 2019-12-23 DIAGNOSIS — I10 Essential (primary) hypertension: Secondary | ICD-10-CM

## 2019-12-23 DIAGNOSIS — I69959 Hemiplegia and hemiparesis following unspecified cerebrovascular disease affecting unspecified side: Secondary | ICD-10-CM | POA: Diagnosis not present

## 2019-12-23 DIAGNOSIS — L97519 Non-pressure chronic ulcer of other part of right foot with unspecified severity: Secondary | ICD-10-CM | POA: Diagnosis not present

## 2019-12-23 DIAGNOSIS — L0390 Cellulitis, unspecified: Secondary | ICD-10-CM | POA: Diagnosis present

## 2019-12-23 DIAGNOSIS — M069 Rheumatoid arthritis, unspecified: Secondary | ICD-10-CM

## 2019-12-23 LAB — GLUCOSE, CAPILLARY: Glucose-Capillary: 101 mg/dL — ABNORMAL HIGH (ref 70–99)

## 2019-12-23 NOTE — Discharge Summary (Signed)
Physician Discharge Summary  Danielle Cruz WUJ:[id number] DOB: 1955-07-05 DOA: 12/21/2019  PCP: Danielle Cruz  Admit date: 12/21/2019 Discharge date: 12/23/2019  Admitted From: home Disposition:  home  Recommendations for Outpatient Follow-up:  1. Follow up with PCP in 1-2 weeks 2. Continue doxycycline for 10 additional days  Home Health: None Equipment/Devices: None  Discharge Condition: Stable CODE STATUS: Full code Diet recommendation: Heart healthy  HPI: Per admitting Cruz, Danielle Cruz is a 64 y.o. female with medical history significant of history of AKI, unspecified anemia, ASCVD, chronic diastolic heart failure, colonic polyp, history of hemorrhoids, history of other non-hemorrhagic CVA with residual right-sided hemiparesis, hyperlipidemia, hypertension, rheumatoid arthritis who is coming to the emergency department due to right ankle pain due to open wound. She is nonverbal after having her stroke and unable to provide further history.  Hospital Course / Discharge diagnoses: Right ankle cellulitis, ankle wound -patient was admitted to the hospital and placed on broad-spectrum antibiotics, vancomycin and meropenem.  Her cellulitis improved, her pain completely resolved.  She underwent a CT scan of the ankle which did not show any abscesses or drainable collections.  She is afebrile, WBC is normal, will be transitioned to oral doxycycline and discharged home in stable condition.  She was advised to follow-up with PCP within a week.  She will be placed on Prevalon boot for pressure unloading on her ankle  Active Problems: AKI (acute kidney injury) ([place]) -creatinine improved with fluids Hyperlipidemia -continue home medications Macrocytic anemia -hemoglobin stable Hemiplegia, late effect of cerebrovascular disease ([place]) -resume home medications Rheumatoid arthritis ([place]) -resume home medications Hypertension -resume home medications  Discharge Instructions   Allergies as  of 12/23/2019      Reactions   Ace Inhibitors Cough   New daily cough since starting ACE      Medication List    TAKE these medications   amLODipine 5 MG tablet Commonly known as: NORVASC Take 1 tablet (5 mg total) by mouth daily.   aspirin EC 81 MG tablet Take 81 mg by mouth daily.   doxycycline 100 MG tablet Commonly known as: VIBRA-TABS Take 1 tablet (100 mg total) by mouth 2 (two) times daily.   ezetimibe 10 MG tablet Commonly known as: Zetia Take 1 tablet (10 mg total) by mouth daily.   fluticasone 50 MCG/ACT nasal spray Commonly known as: FLONASE SHAKE LIQUID AND USE 1 SPRAY IN EACH NOSTRIL DAILY   HYDROcodone-acetaminophen 5-325 MG tablet Commonly known as: NORCO/VICODIN Take 1 tablet by mouth daily as needed.   hydroxychloroquine 200 MG tablet Commonly known as: PLAQUENIL Take 400 mg by mouth daily.   metoprolol succinate 25 MG 24 hr tablet Commonly known as: TOPROL-XL TAKE 1 TABLET(25 MG) BY MOUTH DAILY   montelukast 10 MG tablet Commonly known as: SINGULAIR TAKE 1 TABLET(10 MG) BY MOUTH AT BEDTIME   multivitamin with minerals Tabs tablet Take 1 tablet by mouth daily.   nicotine 14 mg/24hr patch Commonly known as: NICODERM CQ - dosed in mg/24 hours Place 1 patch (14 mg total) onto the skin daily.   oxybutynin 5 MG tablet Commonly known as: DITROPAN TAKE 1/2 TABLET(2.5 MG) BY MOUTH TWICE DAILY   pantoprazole 40 MG tablet Commonly known as: PROTONIX TAKE 1 TABLET(40 MG) BY MOUTH DAILY   potassium chloride SA 20 MEQ tablet Commonly known as: KLOR-CON Take 20 mEq by mouth daily.   rosuvastatin 20 MG tablet Commonly known as: Crestor Take one tablet by mouth every Monday, Wednesday,  Friday and Sunday   torsemide 20 MG tablet Commonly known as: DEMADEX Take 0.5 tablets (10 mg total) by mouth daily.   UNABLE TO FIND Under pads use as needed  Pullups use as needed   Length of need- 99 months DX urinary incontinence   UNABLE TO  FIND Incontinence briefs and pads Dx incontinence       Follow-up Information    Danielle Cruz. Schedule an appointment as soon as possible for a visit in 1 week(s).   Specialty: Family Medicine Contact information: [address] [place] [address] [phone]        Danielle Cruz .   Specialty: Cardiology Contact information: [address] [place] [address] [phone]               Consultations:  None   Procedures/Studies:  DG Ankle Complete Right  Result Date: 12/21/2019 CLINICAL DATA:  Right ankle swelling for several months without known injury. Possible ulceration. EXAM: RIGHT ANKLE - COMPLETE 3+ VIEW COMPARISON:  None. FINDINGS: There is no evidence of fracture, dislocation, or joint effusion. No lytic destruction is seen to suggest osteomyelitis. There appears to be fusion of the talotibial joint which may be due to degenerative disease. Soft tissue swelling is seen medially and laterally, with probable ulceration noted laterally. IMPRESSION: No fracture or dislocation is noted. Soft tissue swelling is seen medially and laterally, with probable ulceration noted laterally. Electronically Signed   By: Marijo Conception M.D.   On: 12/21/2019 09:34   CT ANKLE RIGHT W CONTRAST  Result Date: 12/22/2019 CLINICAL DATA:  Right ankle pain and open wound. EXAM: CT OF THE RIGHT ANKLE WITH CONTRAST TECHNIQUE: Multidetector CT imaging of the right ankle was performed following the standard protocol during bolus administration of intravenous contrast. CONTRAST:  2mL OMNIPAQUE IOHEXOL 300 MG/ML  SOLN COMPARISON:  Radiographs 12/20/2019 FINDINGS: Bones/Joint/Cartilage No bony destructive findings characteristic of osteomyelitis. Multiple bony fusions including talofibular, tibiotalar, and fusion between the navicular and the cuneiforms. There is bony fusion between the cuneiforms and the associated metatarsals. Loss of articular space at the  calcaneocuboid joint and medially in the subtalar joint compatible with osteoarthritis. Small plantar and Achilles calcaneal spurs. Bony demineralization. Os peroneus noted. Ligaments Suboptimally assessed by CT. Muscles and Tendons Probable distal tibialis posterior tendinopathy. Otherwise moderately atrophic musculature of the foot. Soft tissues Prominent subcutaneous edema anteromedially and laterally in the ankle and tracking into the dorsum of the foot. Cutaneous ulceration overlying the lateral malleolus, image 52/8. No significant gas tracking in the soft tissues of the ankle. Atherosclerotic vascular calcifications noted. No abscess or well-defined drainable fluid collection. IMPRESSION: 1. Prominent subcutaneous edema anteromedially and laterally in the ankle and tracking into the dorsum of the foot, potentially reflecting cellulitis. Cutaneous ulceration overlying the lateral malleolus. No significant gas tracking in the soft tissues of the ankle. No well-defined drainable fluid collection. 2. No bony destructive findings characteristic of osteomyelitis. 3. Multiple bony fusions including talofibular, tibiotalar, and fusion between the navicular and the cuneiforms. 4. Probable distal tibialis posterior tendinopathy. 5. moderately atrophic musculature of the foot. 6. Bony demineralization. 7. Atherosclerosis. Electronically Signed   By: Van Clines M.D.   On: 12/22/2019 11:06     Subjective: - no chest pain, shortness of breath, no abdominal pain, nausea or vomiting.   Discharge Exam: BP (!) 177/68 (BP Location: Left Arm)   Pulse 83   Temp 98.3 F (36.8 C) (Oral)  Resp 16   Ht 5\' 6"  (1.676 m)   Wt 81.6 kg   SpO2 98%   BMI 29.05 kg/m   General: Pt is alert, awake, not in acute distress Cardiovascular: RRR, S1/S2 +, no rubs, no gallops Respiratory: CTA bilaterally, no wheezing, no rhonchi Abdominal: Soft, NT, ND, bowel sounds + Extremities: no edema, no cyanosis, right ankle  cellulitis resolved, small 1 cm wound without drainage   The results of significant diagnostics from this hospitalization (including imaging, microbiology, ancillary and laboratory) are listed below for reference.     Microbiology: Recent Results (from the past 240 hour(s))  Blood culture (routine x 2)     Status: None (Preliminary result)   Collection Time: 12/22/19  1:15 AM   Specimen: Right Antecubital; Blood  Result Value Ref Range Status   Specimen Description   Final    RIGHT ANTECUBITAL BLOOD Performed at [facility], [address] [place], [place] [address]    Special Requests   Final    NONE Performed at [facility], [address] [place], [place] [address]    Culture  Setup Time   Final    NO ORGANISMS SEEN AEROBIC BOTTLE ONLY Performed at [facility], [place] [address], [place], [place] [address]    Culture   Final    NO ORGANISMS SEEN Performed at [facility], [address], [place], [place] [address]    Report Status PENDING  Incomplete  SARS Coronavirus 2 by RT PCR (hospital order, performed in [facility] hospital lab) Nasopharyngeal Nasopharyngeal Swab     Status: None   Collection Time: 12/22/19  3:15 AM   Specimen: Nasopharyngeal Swab  Result Value Ref Range Status   SARS Coronavirus 2 NEGATIVE NEGATIVE Final    Comment: (NOTE) SARS-CoV-2 target nucleic acids are NOT DETECTED.  The SARS-CoV-2 RNA is generally detectable in upper and lower respiratory specimens during the acute phase of infection. The lowest concentration of SARS-CoV-2 viral copies this assay can detect is 250 copies / mL. A negative result does not preclude SARS-CoV-2 infection and should not be used as the sole basis for treatment or other patient management decisions.  A negative result may occur with improper specimen collection / handling, submission of specimen other than nasopharyngeal swab, presence of viral mutation(s) within the areas targeted by this assay, and  inadequate number of viral copies (<250 copies / mL). A negative result must be combined with clinical observations, patient history, and epidemiological information.  Fact Sheet for Patients:   StrictlyIdeas.no  Fact Sheet for Healthcare Providers: BankingDealers.co.za  This test is not yet approved or  cleared by the [place] FDA and has been authorized for detection and/or diagnosis of SARS-CoV-2 by FDA under an Emergency Use Authorization (EUA).  This EUA will remain in effect (meaning this test can be used) for the duration of the COVID-19 declaration under Section 564(b)(1) of the Act, 21 U.S.C. section 360bbb-3(b)(1), unless the authorization is terminated or revoked sooner.  Performed at [facility], [address], [place],  [address]   Blood culture (routine x 2)     Status: None (Preliminary result)   Collection Time: 12/22/19  3:55 AM   Specimen: Blood  Result Value Ref Range Status   Specimen Description BLOOD RIGHT ARM  Final   Special Requests   Final    BOTTLES DRAWN AEROBIC AND ANAEROBIC Blood Culture adequate volume   Culture   Final    NO GROWTH <  12 HOURS Performed at [facility], [address] [place], [place] [address]    Report Status PENDING  Incomplete     Labs: Basic Metabolic Panel: Recent Labs  Lab 12/20/19 1407 12/22/19 0115 12/22/19 0912  NA 139 136 140  K 3.8 3.6 3.7  CL 109 108 114*  CO2 18* 17* 17*  GLUCOSE 95 104* 91  BUN 21 22 18   CREATININE 1.53* 1.61* 1.23*  CALCIUM 9.1 9.1 8.7*   Liver Function Tests: No results for input(s): AST, ALT, ALKPHOS, BILITOT, PROT, ALBUMIN in the last 168 hours. CBC: Recent Labs  Lab 12/20/19 1406 12/22/19 0115  WBC 9.2 8.2  NEUTROABS 4,729 4.5  HGB 11.2* 11.6*  HCT 34.2* 36.6  MCV 98.3 101.9*  PLT 170 177   CBG: Recent Labs  Lab 12/22/19 0801 12/22/19 1147 12/22/19 1656 12/23/19 0713  GLUCAP 91 84 84 101*   Hgb  A1c Recent Labs    12/22/19 0356  HGBA1C 6.1*   Lipid Profile No results for input(s): CHOL, HDL, LDLCALC, TRIG, CHOLHDL, LDLDIRECT in the last 72 hours. Thyroid function studies No results for input(s): TSH, T4TOTAL, T3FREE, THYROIDAB in the last 72 hours.  Invalid input(s): FREET3 Urinalysis    Component Value Date/Time   COLORURINE YELLOW 04/28/2009 0825   APPEARANCEUR CLEAR 04/28/2009 0825   LABSPEC 1.020 04/28/2009 0825   PHURINE 6.5 04/28/2009 0825   GLUCOSEU NEGATIVE 04/28/2009 0825   HGBUR NEGATIVE 04/28/2009 0825   BILIRUBINUR NEGATIVE 04/28/2009 0825   KETONESUR NEGATIVE 04/28/2009 0825   PROTEINUR NEGATIVE 04/28/2009 0825   UROBILINOGEN 0.2 04/28/2009 0825   NITRITE NEGATIVE 04/28/2009 0825   LEUKOCYTESUR  04/28/2009 0825    NEGATIVE MICROSCOPIC NOT DONE ON URINES WITH NEGATIVE PROTEIN, BLOOD, LEUKOCYTES, NITRITE, OR GLUCOSE <1000 mg/dL.    FURTHER DISCHARGE INSTRUCTIONS:   Get Medicines reviewed and adjusted: Please take all your medications with you for your next visit with your Primary Cruz   Laboratory/radiological data: Please request your Primary Cruz to go over all hospital tests and procedure/radiological results at the follow up, please ask your Primary Cruz to get all Hospital records sent to his/her office.   In some cases, they will be blood work, cultures and biopsy results pending at the time of your discharge. Please request that your primary care M.D. goes through all the records of your hospital data and follows up on these results.   Also Note the following: If you experience worsening of your admission symptoms, develop shortness of breath, life threatening emergency, suicidal or homicidal thoughts you must seek medical attention immediately by calling 911 or calling your Cruz immediately  if symptoms less severe.   You must read complete instructions/literature along with all the possible adverse reactions/side effects for all the Medicines you take  and that have been prescribed to you. Take any new Medicines after you have completely understood and accpet all the possible adverse reactions/side effects.    Do not drive when taking Pain medications or sleeping medications (Benzodaizepines)   Do not take more than prescribed Pain, Sleep and Anxiety Medications. It is not advisable to combine anxiety,sleep and pain medications without talking with your primary care practitioner   Special Instructions: If you have smoked or chewed Tobacco  in the last 2 yrs please stop smoking, stop any regular Alcohol  and or any Recreational drug use.   Wear Seat belts while driving.   Please note: You were cared for by a hospitalist during your hospital stay. Once  you are discharged, your primary care physician will handle any further medical issues. Please note that NO REFILLS for any discharge medications will be authorized once you are discharged, as it is imperative that you return to your primary care physician (or establish a relationship with a primary care physician if you do not have one) for your post hospital discharge needs so that they can reassess your need for medications and monitor your lab values.  Time coordinating discharge: 35 minutes  SIGNED:  Marzetta Board, MD, PhD 12/23/2019, 9:50 AM

## 2019-12-23 NOTE — Plan of Care (Signed)
  Problem: Education: Goal: Knowledge of General Education information will improve Description Including pain rating scale, medication(s)/side effects and non-pharmacologic comfort measures Outcome: Progressing   Problem: Health Behavior/Discharge Planning: Goal: Ability to manage health-related needs will improve Outcome: Progressing   

## 2019-12-23 NOTE — Care Management Obs Status (Signed)
[place] NOTIFICATION   Patient Details  Name: Danielle Cruz MRN: [id number] Date of Birth: 1955/10/02   Medicare Observation Status Notification Given:  Yes    Natasha Bence, LCSW 12/23/2019, 10:33 AM

## 2019-12-23 NOTE — Care Management CC44 (Signed)
Condition Code 44 Documentation Completed  Patient Details  Name: Danielle Cruz MRN: [id number] Date of Birth: May 08, 1955   Condition Code 44 given:  Yes Patient signature on Condition Code 44 notice:  Yes Documentation of 2 MD's agreement:  Yes Code 44 added to claim:  Yes    Natasha Bence, LCSW 12/23/2019, 10:41 AM

## 2019-12-23 NOTE — Progress Notes (Signed)
Nsg Discharge Note  Admit Date:  12/21/2019 Discharge date: 12/23/2019   Danielle Cruz to be D/C'd Home per MD order.  AVS completed.  Copy for chart, and copy for patient signed, and dated. Removed IV-clean, dry, intact. Reviewed d/c paperwork with patient and husband. Answered all questions. Wheeled stable patient and belongings to main entrance where she was picked up by her husband to d/c to home. Patient/caregiver able to verbalize understanding.  Discharge Medication: Allergies as of 12/23/2019      Reactions   Ace Inhibitors Cough   New daily cough since starting ACE      Medication List    TAKE these medications   amLODipine 5 MG tablet Commonly known as: NORVASC Take 1 tablet (5 mg total) by mouth daily.   aspirin EC 81 MG tablet Take 81 mg by mouth daily.   doxycycline 100 MG tablet Commonly known as: VIBRA-TABS Take 1 tablet (100 mg total) by mouth 2 (two) times daily.   ezetimibe 10 MG tablet Commonly known as: Zetia Take 1 tablet (10 mg total) by mouth daily.   fluticasone 50 MCG/ACT nasal spray Commonly known as: FLONASE SHAKE LIQUID AND USE 1 SPRAY IN EACH NOSTRIL DAILY   HYDROcodone-acetaminophen 5-325 MG tablet Commonly known as: NORCO/VICODIN Take 1 tablet by mouth daily as needed.   hydroxychloroquine 200 MG tablet Commonly known as: PLAQUENIL Take 400 mg by mouth daily.   metoprolol succinate 25 MG 24 hr tablet Commonly known as: TOPROL-XL TAKE 1 TABLET(25 MG) BY MOUTH DAILY   montelukast 10 MG tablet Commonly known as: SINGULAIR TAKE 1 TABLET(10 MG) BY MOUTH AT BEDTIME   multivitamin with minerals Tabs tablet Take 1 tablet by mouth daily.   nicotine 14 mg/24hr patch Commonly known as: NICODERM CQ - dosed in mg/24 hours Place 1 patch (14 mg total) onto the skin daily.   oxybutynin 5 MG tablet Commonly known as: DITROPAN TAKE 1/2 TABLET(2.5 MG) BY MOUTH TWICE DAILY   pantoprazole 40 MG tablet Commonly known as: PROTONIX TAKE 1 TABLET(40  MG) BY MOUTH DAILY   potassium chloride SA 20 MEQ tablet Commonly known as: KLOR-CON Take 20 mEq by mouth daily.   rosuvastatin 20 MG tablet Commonly known as: Crestor Take one tablet by mouth every Monday, Wednesday, Friday and Sunday   torsemide 20 MG tablet Commonly known as: DEMADEX Take 0.5 tablets (10 mg total) by mouth daily.   UNABLE TO FIND Under pads use as needed  Pullups use as needed   Length of need- 99 months DX urinary incontinence   UNABLE TO FIND Incontinence briefs and pads Dx incontinence       Discharge Assessment: Vitals:   12/23/19 0519 12/23/19 0539  BP: (!) 170/73 (!) 177/68  Pulse: 82 83  Resp: 16 16  Temp: 98.3 F (36.8 C)   SpO2: 100% 98%   Skin clean, dry and intact without evidence of skin break down, no evidence of skin tears noted. IV catheter discontinued intact. Site without signs and symptoms of complications - no redness or edema noted at insertion site, patient denies c/o pain - only slight tenderness at site.  Dressing with slight pressure applied.  D/c Instructions-Education: Discharge instructions given to patient/family with verbalized understanding. D/c education completed with patient/family including follow up instructions, medication list, d/c activities limitations if indicated, with other d/c instructions as indicated by MD - patient able to verbalize understanding, all questions fully answered. Patient instructed to return to ED, call 911, or call  MD for any changes in condition.  Patient escorted via [place], and D/C home via private auto.  Santa Lighter, RN 12/23/2019 2:10 PM

## 2019-12-23 NOTE — Progress Notes (Signed)
Applied wet to dry dressing to right ankle and covered with foam. Showed husband how to apply dressing.

## 2019-12-23 NOTE — Plan of Care (Signed)
  Problem: Education: Goal: Knowledge of General Education information will improve Description: Including pain rating scale, medication(s)/side effects and non-pharmacologic comfort measures 12/23/2019 1405 by Santa Lighter, RN Outcome: Adequate for Discharge 12/23/2019 1405 by Santa Lighter, RN Outcome: Progressing   Problem: Health Behavior/Discharge Planning: Goal: Ability to manage health-related needs will improve 12/23/2019 1405 by Santa Lighter, RN Outcome: Adequate for Discharge 12/23/2019 1405 by Santa Lighter, RN Outcome: Progressing   Problem: Clinical Measurements: Goal: Ability to maintain clinical measurements within normal limits will improve Outcome: Adequate for Discharge Goal: Will remain free from infection Outcome: Adequate for Discharge Goal: Diagnostic test results will improve Outcome: Adequate for Discharge Goal: Respiratory complications will improve Outcome: Adequate for Discharge Goal: Cardiovascular complication will be avoided Outcome: Adequate for Discharge   Problem: Activity: Goal: Risk for activity intolerance will decrease Outcome: Adequate for Discharge   Problem: Nutrition: Goal: Adequate nutrition will be maintained Outcome: Adequate for Discharge   Problem: Coping: Goal: Level of anxiety will decrease Outcome: Adequate for Discharge   Problem: Elimination: Goal: Will not experience complications related to bowel motility Outcome: Adequate for Discharge Goal: Will not experience complications related to urinary retention Outcome: Adequate for Discharge   Problem: Pain Managment: Goal: General experience of comfort will improve Outcome: Adequate for Discharge   Problem: Safety: Goal: Ability to remain free from injury will improve Outcome: Adequate for Discharge   Problem: Skin Integrity: Goal: Risk for impaired skin integrity will decrease Outcome: Adequate for Discharge

## 2019-12-25 ENCOUNTER — Telehealth: Payer: Self-pay

## 2019-12-25 DIAGNOSIS — R748 Abnormal levels of other serum enzymes: Secondary | ICD-10-CM | POA: Diagnosis not present

## 2019-12-25 DIAGNOSIS — M25579 Pain in unspecified ankle and joints of unspecified foot: Secondary | ICD-10-CM | POA: Diagnosis not present

## 2019-12-25 DIAGNOSIS — K760 Fatty (change of) liver, not elsewhere classified: Secondary | ICD-10-CM | POA: Diagnosis not present

## 2019-12-25 DIAGNOSIS — M2550 Pain in unspecified joint: Secondary | ICD-10-CM | POA: Diagnosis not present

## 2019-12-25 DIAGNOSIS — Z79891 Long term (current) use of opiate analgesic: Secondary | ICD-10-CM | POA: Diagnosis not present

## 2019-12-25 DIAGNOSIS — G8929 Other chronic pain: Secondary | ICD-10-CM | POA: Diagnosis not present

## 2019-12-25 DIAGNOSIS — Z79899 Other long term (current) drug therapy: Secondary | ICD-10-CM | POA: Diagnosis not present

## 2019-12-25 DIAGNOSIS — M7989 Other specified soft tissue disorders: Secondary | ICD-10-CM | POA: Diagnosis not present

## 2019-12-25 DIAGNOSIS — M810 Age-related osteoporosis without current pathological fracture: Secondary | ICD-10-CM | POA: Diagnosis not present

## 2019-12-25 DIAGNOSIS — M0579 Rheumatoid arthritis with rheumatoid factor of multiple sites without organ or systems involvement: Secondary | ICD-10-CM | POA: Diagnosis not present

## 2019-12-25 NOTE — Telephone Encounter (Signed)
Transition Care Management Follow-up Telephone Call  Date of discharge and from where: 12/23/2019 [facility]  How have you been since you were released from the hospital? She is doing a lot better  Any questions or concerns? Yes concerns over the wound. Wants a wound doctor to look at it  Items Reviewed:  Did the pt receive and understand the discharge instructions provided? Yes   Medications obtained and verified? Yes   Any new allergies since your discharge? No   Dietary orders reviewed? Yes  Do you have support at home? Yes   Functional Questionnaire: (I = Independent and D = Dependent) ADLs: d  Bathing/Dressing- d Meal Prep- d  Eating- I    Maintaining continence- yes   Transferring/Ambulation- d   Managing Meds- pt's daughter does medications  Follow up appointments reviewed:   PCP Hospital f/u appt confirmed? Yes  Scheduled to see 01/02/21 on  With [facility] f/u appt confirmed? No  Scheduled to see  on    Are transportation arrangements needed? No   If their condition worsens, is the pt aware to call PCP or go to the Emergency Dept.? Yes  Was the patient provided with contact information for the PCP's office or ED? Yes  Was to pt encouraged to call back with questions or concerns? Yes

## 2019-12-27 LAB — CULTURE, BLOOD (ROUTINE X 2)
Culture  Setup Time: NONE SEEN
Culture: NO GROWTH
Culture: NO GROWTH
Special Requests: ADEQUATE

## 2019-12-28 ENCOUNTER — Other Ambulatory Visit: Payer: Self-pay

## 2019-12-28 DIAGNOSIS — I739 Peripheral vascular disease, unspecified: Secondary | ICD-10-CM

## 2020-01-03 ENCOUNTER — Ambulatory Visit: Payer: Medicare Other | Admitting: Family Medicine

## 2020-01-10 ENCOUNTER — Ambulatory Visit (INDEPENDENT_AMBULATORY_CARE_PROVIDER_SITE_OTHER): Payer: Medicare Other | Admitting: Family Medicine

## 2020-01-10 ENCOUNTER — Other Ambulatory Visit: Payer: Self-pay

## 2020-01-10 ENCOUNTER — Encounter: Payer: Self-pay | Admitting: Family Medicine

## 2020-01-10 VITALS — BP 128/62 | HR 71 | Temp 97.3°F | Resp 16 | Ht 67.0 in | Wt 180.4 lb

## 2020-01-10 DIAGNOSIS — I10 Essential (primary) hypertension: Secondary | ICD-10-CM | POA: Diagnosis not present

## 2020-01-10 DIAGNOSIS — L03115 Cellulitis of right lower limb: Secondary | ICD-10-CM

## 2020-01-10 DIAGNOSIS — Z720 Tobacco use: Secondary | ICD-10-CM | POA: Diagnosis not present

## 2020-01-10 DIAGNOSIS — E559 Vitamin D deficiency, unspecified: Secondary | ICD-10-CM

## 2020-01-10 DIAGNOSIS — R7301 Impaired fasting glucose: Secondary | ICD-10-CM

## 2020-01-10 DIAGNOSIS — E785 Hyperlipidemia, unspecified: Secondary | ICD-10-CM

## 2020-01-10 DIAGNOSIS — Z79899 Other long term (current) drug therapy: Secondary | ICD-10-CM | POA: Diagnosis not present

## 2020-01-10 DIAGNOSIS — F17210 Nicotine dependence, cigarettes, uncomplicated: Secondary | ICD-10-CM

## 2020-01-10 DIAGNOSIS — I2510 Atherosclerotic heart disease of native coronary artery without angina pectoris: Secondary | ICD-10-CM

## 2020-01-10 NOTE — Assessment & Plan Note (Signed)
Hyperlipidemia:Low fat diet discussed and encouraged.   Lipid Panel  Lab Results  Component Value Date   CHOL 126 01/22/2019   HDL 23 (L) 01/22/2019   LDLCALC 69 01/22/2019   TRIG 246 (H) 01/22/2019   CHOLHDL 5.5 (H) 01/22/2019   Updated lab today

## 2020-01-10 NOTE — Assessment & Plan Note (Signed)
RLE cellulitis completely resolved

## 2020-01-10 NOTE — Assessment & Plan Note (Signed)
Asked:confirms currently smokes cigarettes on avg 6 /day Assess: Unwilling to set a quit date, but is cutting back Advise: needs to QUIT to reduce risk of cancer, cardio and cerebrovascular disease Assist: counseled for 5 minutes and literature provided Arrange: follow up in 2 to 4 months

## 2020-01-10 NOTE — Assessment & Plan Note (Signed)
Controlled, no change in medication  

## 2020-01-10 NOTE — Assessment & Plan Note (Signed)
resolved 

## 2020-01-10 NOTE — Progress Notes (Signed)
   Danielle Cruz     MRN: [id number]      DOB: July 27, 1955   HPI Danielle Cruz is here for follow up and re-evaluation of  RLE cellulitis and chronic medical conditions, medication management and review of any available recent lab and radiology data.  Preventive health is updated, specifically  Cancer screening and Immunization.   Danielle Cruz was hospitalized for 3 days because of the cellulitis, now completely  Better and spouse intentionally having her elevate her leg, lying down for 2 to 3 hrs every afternoon The PT denies any adverse reactions to current medications since the last visit.  There are no new concerns.  There are no specific complaints   ROS Denies recent fever or chills. Denies sinus pressure, nasal congestion, ear pain or sore throat. Denies chest congestion, productive cough or wheezing. Denies chest pains, palpitations and leg swelling Denies abdominal pain, nausea, vomiting,diarrhea or constipation.   Denies dysuria, frequency, hesitancy or incontinence. Chronic  joint pain, swelling and limitation in mobility. Denies headaches, seizures, numbness, or tingling. Denies depression, anxiety or insomnia. Denies skin break down or rash.   PE  BP 128/62 (BP Location: Left Arm, Patient Position: Sitting, Cuff Size: Normal)   Pulse 71   Temp (!) 97.3 F (36.3 C) (Temporal)   Resp 16   Ht 5\' 7"  (1.702 m)   Wt 180 lb 6.4 oz (81.8 kg)   SpO2 95%   BMI 28.25 kg/m   Patient alert  and in no cardiopulmonary distress.  HEENT: No facial asymmetry, EOMI,     Neck supple .  Chest: Clear to auscultation bilaterally.  CVS: S1, S2 no murmurs, no S3.Regular rate.  ABD: Soft non tender.   Ext: No edema  ME:[id number] ROM spine, shoulders, hips and knees, wheelchair depenedent  Skin: Intact, no ulcerations or rash noted.  Psych: Good eye contact, normal affectt not anxious or depressed appearing.  CNS: CN 2-12 intact, right upper and lower hemiparesis Assessment &  Plan  Cellulitis of right ankle resolved  Cellulitis RLE cellulitis completely resolved  Tobacco abuse Asked:confirms currently smokes cigarettes on avg 6 /day Assess: Unwilling to set a quit date, but is cutting back Advise: needs to QUIT to reduce risk of cancer, cardio and cerebrovascular disease Assist: counseled for 5 minutes and literature provided Arrange: follow up in 2 to 4 months   Hyperlipidemia Hyperlipidemia:Low fat diet discussed and encouraged.   Lipid Panel  Lab Results  Component Value Date   CHOL 126 01/22/2019   HDL 23 (L) 01/22/2019   LDLCALC 69 01/22/2019   TRIG 246 (H) 01/22/2019   CHOLHDL 5.5 (H) 01/22/2019   Updated lab today    Essential hypertension Controlled, no change in medication

## 2020-01-10 NOTE — Patient Instructions (Addendum)
Lipid, hepatic, tSH and vit D today ( [facility])  F/u in office witjh MD end [place], call if you need me sooner  Plan to stop smoking by Dec 31,2021, you cAN do this!   Leg is better, elevate for 3 hours every afternoon to help to reduce swelling     Steps to Quit Smoking Smoking tobacco is the leading cause of preventable death. It can affect almost every organ in the body. Smoking puts you and people around you at risk for many serious, long-lasting (chronic) diseases. Quitting smoking can be hard, but it is one of the best things that you can do for your health. It is never too late to quit. How do I get ready to quit? When you decide to quit smoking, make a plan to help you succeed. Before you quit:  Pick a date to quit. Set a date within the next 2 weeks to give you time to prepare.  Write down the reasons why you are quitting. Keep this list in places where you will see it often.  Tell your family, friends, and co-workers that you are quitting. Their support is important.  Talk with your doctor about the choices that may help you quit.  Find out if your health insurance will pay for these treatments.  Know the people, places, things, and activities that make you want to smoke (triggers). Avoid them. What first steps can I take to quit smoking?  Throw away all cigarettes at home, at work, and in your car.  Throw away the things that you use when you smoke, such as ashtrays and lighters.  Clean your car. Make sure to empty the ashtray.  Clean your home, including curtains and carpets. What can I do to help me quit smoking? Talk with your doctor about taking medicines and seeing a counselor at the same time. You are more likely to succeed when you do both.  If you are pregnant or breastfeeding, talk with your doctor about counseling or other ways to quit smoking. Do not take medicine to help you quit smoking unless your doctor tells you to do so. To quit smoking: Quit right  away  Quit smoking totally, instead of slowly cutting back on how much you smoke over a period of time.  Go to counseling. You are more likely to quit if you go to counseling sessions regularly. Take medicine You may take medicines to help you quit. Some medicines need a prescription, and some you can buy over-the-counter. Some medicines may contain a drug called nicotine to replace the nicotine in cigarettes. Medicines may:  Help you to stop having the desire to smoke (cravings).  Help to stop the problems that come when you stop smoking (withdrawal symptoms). Your doctor may ask you to use:  Nicotine patches, gum, or lozenges.  Nicotine inhalers or sprays.  Non-nicotine medicine that is taken by mouth. Find resources Find resources and other ways to help you quit smoking and remain smoke-free after you quit. These resources are most helpful when you use them often. They include:  Online chats with a Social worker.  Phone quitlines.  Printed Furniture conservator/restorer.  Support groups or group counseling.  Text messaging programs.  Mobile phone apps. Use apps on your mobile phone or tablet that can help you stick to your quit plan. There are many free apps for mobile phones and tablets as well as websites. Examples include Quit Guide from the [employer] and smokefree.gov  What things can I do to  make it easier to quit?   Talk to your family and friends. Ask them to support and encourage you.  Call a phone quitline (1-800-QUIT-NOW), reach out to support groups, or work with a Social worker.  Ask people who smoke to not smoke around you.  Avoid places that make you want to smoke, such as: ? Bars. ? Parties. ? Smoke-break areas at work.  Spend time with people who do not smoke.  Lower the stress in your life. Stress can make you want to smoke. Try these things to help your stress: ? Getting regular exercise. ? Doing deep-breathing exercises. ? Doing yoga. ? Meditating. ? Doing a body  scan. To do this, close your eyes, focus on one area of your body at a time from head to toe. Notice which parts of your body are tense. Try to relax the muscles in those areas. How will I feel when I quit smoking? Day 1 to 3 weeks Within the first 24 hours, you may start to have some problems that come from quitting tobacco. These problems are very bad 2-3 days after you quit, but they do not often last for more than 2-3 weeks. You may get these symptoms:  Mood swings.  Feeling restless, nervous, angry, or annoyed.  Trouble concentrating.  Dizziness.  Strong desire for high-sugar foods and nicotine.  Weight gain.  Trouble pooping (constipation).  Feeling like you may vomit (nausea).  Coughing or a sore throat.  Changes in how the medicines that you take for other issues work in your body.  Depression.  Trouble sleeping (insomnia). Week 3 and afterward After the first 2-3 weeks of quitting, you may start to notice more positive results, such as:  Better sense of smell and taste.  Less coughing and sore throat.  Slower heart rate.  Lower blood pressure.  Clearer skin.  Better breathing.  Fewer sick days. Quitting smoking can be hard. Do not give up if you fail the first time. Some people need to try a few times before they succeed. Do your best to stick to your quit plan, and talk with your doctor if you have any questions or concerns. Summary  Smoking tobacco is the leading cause of preventable death. Quitting smoking can be hard, but it is one of the best things that you can do for your health.  When you decide to quit smoking, make a plan to help you succeed.  Quit smoking right away, not slowly over a period of time.  When you start quitting, seek help from your doctor, family, or friends. This information is not intended to replace advice given to you by your health care provider. Make sure you discuss any questions you have with your health care  provider. Document Revised: 12/29/2018 Document Reviewed: 06/24/2018 Elsevier Patient Education  [place].  Steps to Quit Smoking Smoking tobacco is the leading cause of preventable death. It can affect almost every organ in the body. Smoking puts you and people around you at risk for many serious, long-lasting (chronic) diseases. Quitting smoking can be hard, but it is one of the best things that you can do for your health. It is never too late to quit. How do I get ready to quit? When you decide to quit smoking, make a plan to help you succeed. Before you quit:  Pick a date to quit. Set a date within the next 2 weeks to give you time to prepare.  Write down the reasons why you are  quitting. Keep this list in places where you will see it often.  Tell your family, friends, and co-workers that you are quitting. Their support is important.  Talk with your doctor about the choices that may help you quit.  Find out if your health insurance will pay for these treatments.  Know the people, places, things, and activities that make you want to smoke (triggers). Avoid them. What first steps can I take to quit smoking?  Throw away all cigarettes at home, at work, and in your car.  Throw away the things that you use when you smoke, such as ashtrays and lighters.  Clean your car. Make sure to empty the ashtray.  Clean your home, including curtains and carpets. What can I do to help me quit smoking? Talk with your doctor about taking medicines and seeing a counselor at the same time. You are more likely to succeed when you do both.  If you are pregnant or breastfeeding, talk with your doctor about counseling or other ways to quit smoking. Do not take medicine to help you quit smoking unless your doctor tells you to do so. To quit smoking: Quit right away  Quit smoking totally, instead of slowly cutting back on how much you smoke over a period of time.  Go to counseling. You are  more likely to quit if you go to counseling sessions regularly. Take medicine You may take medicines to help you quit. Some medicines need a prescription, and some you can buy over-the-counter. Some medicines may contain a drug called nicotine to replace the nicotine in cigarettes. Medicines may:  Help you to stop having the desire to smoke (cravings).  Help to stop the problems that come when you stop smoking (withdrawal symptoms). Your doctor may ask you to use:  Nicotine patches, gum, or lozenges.  Nicotine inhalers or sprays.  Non-nicotine medicine that is taken by mouth. Find resources Find resources and other ways to help you quit smoking and remain smoke-free after you quit. These resources are most helpful when you use them often. They include:  Online chats with a Social worker.  Phone quitlines.  Printed Furniture conservator/restorer.  Support groups or group counseling.  Text messaging programs.  Mobile phone apps. Use apps on your mobile phone or tablet that can help you stick to your quit plan. There are many free apps for mobile phones and tablets as well as websites. Examples include Quit Guide from the [employer] and smokefree.gov  What things can I do to make it easier to quit?   Talk to your family and friends. Ask them to support and encourage you.  Call a phone quitline (1-800-QUIT-NOW), reach out to support groups, or work with a Social worker.  Ask people who smoke to not smoke around you.  Avoid places that make you want to smoke, such as: ? Bars. ? Parties. ? Smoke-break areas at work.  Spend time with people who do not smoke.  Lower the stress in your life. Stress can make you want to smoke. Try these things to help your stress: ? Getting regular exercise. ? Doing deep-breathing exercises. ? Doing yoga. ? Meditating. ? Doing a body scan. To do this, close your eyes, focus on one area of your body at a time from head to toe. Notice which parts of your body are tense.  Try to relax the muscles in those areas. How will I feel when I quit smoking? Day 1 to 3 weeks Within the first 24 hours, you  may start to have some problems that come from quitting tobacco. These problems are very bad 2-3 days after you quit, but they do not often last for more than 2-3 weeks. You may get these symptoms:  Mood swings.  Feeling restless, nervous, angry, or annoyed.  Trouble concentrating.  Dizziness.  Strong desire for high-sugar foods and nicotine.  Weight gain.  Trouble pooping (constipation).  Feeling like you may vomit (nausea).  Coughing or a sore throat.  Changes in how the medicines that you take for other issues work in your body.  Depression.  Trouble sleeping (insomnia). Week 3 and afterward After the first 2-3 weeks of quitting, you may start to notice more positive results, such as:  Better sense of smell and taste.  Less coughing and sore throat.  Slower heart rate.  Lower blood pressure.  Clearer skin.  Better breathing.  Fewer sick days. Quitting smoking can be hard. Do not give up if you fail the first time. Some people need to try a few times before they succeed. Do your best to stick to your quit plan, and talk with your doctor if you have any questions or concerns. Summary  Smoking tobacco is the leading cause of preventable death. Quitting smoking can be hard, but it is one of the best things that you can do for your health.  When you decide to quit smoking, make a plan to help you succeed.  Quit smoking right away, not slowly over a period of time.  When you start quitting, seek help from your doctor, family, or friends. This information is not intended to replace advice given to you by your health care provider. Make sure you discuss any questions you have with your health care provider. Document Revised: 12/29/2018 Document Reviewed: 06/24/2018 Elsevier Patient Education  [place].

## 2020-01-11 LAB — LIPID PANEL
Cholesterol: 100 mg/dL (ref <200)
HDL: 24 mg/dL — ABNORMAL LOW (ref >=50)
LDL Cholesterol (Calc): 49 mg/dL (calc)
Non-HDL Cholesterol (Calc): 76 mg/dL (calc) (ref <130)
Total CHOL/HDL Ratio: 4.2 (calc) (ref <5.0)
Triglycerides: 195 mg/dL — ABNORMAL HIGH (ref <150)

## 2020-01-11 LAB — BASIC METABOLIC PANEL
BUN: 8 mg/dL (ref 7–25)
CO2: 24 mmol/L (ref 20–32)
Calcium: 9.2 mg/dL (ref 8.6–10.4)
Chloride: 111 mmol/L — ABNORMAL HIGH (ref 98–110)
Creat: 0.89 mg/dL (ref 0.50–0.99)
Glucose, Bld: 104 mg/dL — ABNORMAL HIGH (ref 65–99)
Potassium: 3.8 mmol/L (ref 3.5–5.3)
Sodium: 142 mmol/L (ref 135–146)

## 2020-01-11 LAB — HEPATIC FUNCTION PANEL
AG Ratio: 1 (calc) (ref 1.0–2.5)
ALT: 68 U/L — ABNORMAL HIGH (ref 6–29)
AST: 59 U/L — ABNORMAL HIGH (ref 10–35)
Albumin: 3.6 g/dL (ref 3.6–5.1)
Alkaline phosphatase (APISO): 86 U/L (ref 37–153)
Bilirubin, Direct: 0.1 mg/dL (ref 0.0–0.2)
Globulin: 3.5 g/dL (calc) (ref 1.9–3.7)
Indirect Bilirubin: 0.3 mg/dL (calc) (ref 0.2–1.2)
Total Bilirubin: 0.4 mg/dL (ref 0.2–1.2)
Total Protein: 7.1 g/dL (ref 6.1–8.1)

## 2020-01-11 LAB — VITAMIN D 25 HYDROXY (VIT D DEFICIENCY, FRACTURES): Vit D, 25-Hydroxy: 85 ng/mL (ref 30–100)

## 2020-01-11 LAB — TSH: TSH: 1.28 mIU/L (ref 0.40–4.50)

## 2020-01-15 ENCOUNTER — Other Ambulatory Visit: Payer: Self-pay | Admitting: Family Medicine

## 2020-01-16 ENCOUNTER — Other Ambulatory Visit: Payer: Self-pay

## 2020-01-16 ENCOUNTER — Ambulatory Visit (INDEPENDENT_AMBULATORY_CARE_PROVIDER_SITE_OTHER): Payer: Medicare Other | Admitting: Family Medicine

## 2020-01-16 ENCOUNTER — Encounter: Payer: Self-pay | Admitting: Family Medicine

## 2020-01-16 VITALS — BP 146/77 | HR 80 | Resp 18 | Ht 67.0 in | Wt 184.0 lb

## 2020-01-16 DIAGNOSIS — I739 Peripheral vascular disease, unspecified: Secondary | ICD-10-CM

## 2020-01-16 DIAGNOSIS — I2510 Atherosclerotic heart disease of native coronary artery without angina pectoris: Secondary | ICD-10-CM

## 2020-01-16 DIAGNOSIS — L03115 Cellulitis of right lower limb: Secondary | ICD-10-CM

## 2020-01-16 DIAGNOSIS — I10 Essential (primary) hypertension: Secondary | ICD-10-CM

## 2020-01-16 DIAGNOSIS — Z720 Tobacco use: Secondary | ICD-10-CM

## 2020-01-16 MED ORDER — CEFTRIAXONE SODIUM 500 MG IJ SOLR
500.0000 mg | Freq: Once | INTRAMUSCULAR | Status: AC
  Administered 2020-01-16: 500 mg via INTRAMUSCULAR

## 2020-01-16 MED ORDER — CEFTRIAXONE SODIUM 500 MG IJ SOLR: 500.0000 mg | Freq: Once | INTRAMUSCULAR | Status: DC

## 2020-01-16 MED ORDER — CLINDAMYCIN HCL 300 MG PO CAPS: ORAL_CAPSULE | ORAL | 0 refills | Status: DC

## 2020-01-16 NOTE — Assessment & Plan Note (Signed)
RLE recurrent skin breakdown Rocephin 500mg  IM followed by 1 1 week of cleocin , clean and dress and refer vascular for evaluation soonest available

## 2020-01-16 NOTE — Patient Instructions (Addendum)
F/U in office with mD in 3 weeks, call if you need me sooner  Rocephin 500 mg im im office followed by a 10 day course of cleocin for skin ulcers  You are referred urgently to vein specialist also  Important that you STOP smoking to improve circulation in your legs  Use tylenol ES one tablet 2 to 3 times daily for pain please   Careful not to fall!

## 2020-01-18 ENCOUNTER — Other Ambulatory Visit: Payer: Self-pay | Admitting: Family Medicine

## 2020-01-18 ENCOUNTER — Other Ambulatory Visit: Payer: Self-pay

## 2020-01-18 ENCOUNTER — Ambulatory Visit (INDEPENDENT_AMBULATORY_CARE_PROVIDER_SITE_OTHER): Payer: Medicare Other | Admitting: Vascular Surgery

## 2020-01-18 ENCOUNTER — Encounter: Payer: Self-pay | Admitting: Family Medicine

## 2020-01-18 ENCOUNTER — Ambulatory Visit (HOSPITAL_COMMUNITY)
Admission: RE | Admit: 2020-01-18 | Discharge: 2020-01-18 | Disposition: A | Discharge status: Home / Self Care | Payer: Medicare Other | Source: Ambulatory Visit | Attending: Vascular Surgery | Admitting: Vascular Surgery

## 2020-01-18 ENCOUNTER — Encounter: Payer: Self-pay | Admitting: Vascular Surgery

## 2020-01-18 VITALS — BP 156/76 | HR 72 | Temp 97.6°F | Resp 20

## 2020-01-18 DIAGNOSIS — I739 Peripheral vascular disease, unspecified: Secondary | ICD-10-CM | POA: Diagnosis not present

## 2020-01-18 DIAGNOSIS — M79604 Pain in right leg: Secondary | ICD-10-CM | POA: Diagnosis not present

## 2020-01-18 DIAGNOSIS — M79605 Pain in left leg: Secondary | ICD-10-CM

## 2020-01-18 NOTE — Assessment & Plan Note (Addendum)
Reports reduce nicotine use 5 or less / day, trying to quit. Importance ofquitting to help circulation and reduce skin ulceration is discussed Asked:confirms currently smokes cigarettes Assess: Unwilling to set a quit date, but is cutting back Advise: needs to QUIT to reduce risk of cancer, cardio and cerebrovascular disease Assist: counseled for 5 minutes and literature provided Arrange: follow up in 2 to 4 months' '

## 2020-01-18 NOTE — Progress Notes (Signed)
Patient ID: Danielle Cruz, female   DOB: 10/08/1955, 64 y.o.   MRN: [id number]  Reason for Consult: New Patient (Initial Visit)   Referred by Fayrene Helper, MD  Subjective:     HPI:  Danielle Cruz is a 64 y.o. female history of stroke now she is nonverbal she has residual right-sided weakness.  She is mostly in a wheelchair she does use her left leg to transfer.  Right leg has a wound for 2 months approximately.  She does not have any fevers or chills.  She is taking antibiotics at this time.  Risk factors for vascular disease include hyperlipidemia, hypertension and she is a current everyday smoker.  She has dependent edema of the right lower extremity.  Past Medical History:  Diagnosis Date  . AKI (acute kidney injury) ([place]) 09/22/2017  . Anemia   . 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 Arthritis   . CHF (congestive heart failure) ([place]) 10/02/2019  . Colonic polyp 2010   Hemorrhoids; h/o mild hematochezia  . CVA (cerebral infarction) 08/2008   sizable left -residual expressive aphasia, right sided weakness; ambulates with difficulty with the right leg brace   . Hyperlipidemia   . Hypertension 2005  . Rheumatoid arthritis(714.0)   . Stroke [facility])    Family History  Problem Relation Age of Onset  . Cancer Father        prostate, deceased 7s  . Breast cancer Sister        Deceased 67s  . Heart attack Mother        deceased 78, during childbirth  . Cancer Brother        lymphoma  . Colon cancer Maternal Aunt        older than age 55  . Liver disease Neg Hx    Past Surgical History:  Procedure Laterality Date  . ANKLE SURGERY     Right  . CAROTID STENT INSERTION    . COLONOSCOPY W/ POLYPECTOMY  11/2008   Dr. Gala Romney. Left-sided diverticula, Pedunculated polyp snared (no adenomatous changes)  . COLONOSCOPY WITH PROPOFOL N/A 01/12/2018   Procedure: COLONOSCOPY WITH PROPOFOL;  Surgeon: Daneil Dolin,  MD;  [facility];  Service: Endoscopy;  Laterality: N/A;  . FEMUR IM NAIL Right 09/23/2017   Procedure: INTRAMEDULLARY (IM) NAIL FEMORAL;  Surgeon: Renette Butters, MD;  Location: [facility];  Service: Orthopedics;  Laterality: Right;  . FRACTURE SURGERY     Hip replacement in May 2019 - fall related   . OOPHORECTOMY      Short Social History:  Social History   Tobacco Use  . Smoking status: Current Every Day Smoker    Packs/day: 0.50    Years: 15.00    Pack years: 7.50    Types: Cigarettes  . Smokeless tobacco: Never Used  . Tobacco comment: quit after hospitized for hip fracture   Substance Use Topics  . Alcohol use: No    Alcohol/week: 0.0 standard drinks    Comment: quit 6 years ago     Allergies  Allergen Reactions  . Ace Inhibitors Cough    New daily cough since starting ACE    Current Outpatient Medications  Medication Sig Dispense Refill  . amLODipine (NORVASC) 5 MG tablet Take 1 tablet (5 mg total) by mouth daily. 30 tablet 3  . aspirin EC 81 MG tablet Take 81 mg by mouth  daily.    . clindamycin (CLEOCIN) 300 MG capsule Take one capsule by mouth three times daily for 10 days 30 capsule 0  . doxycycline (VIBRA-TABS) 100 MG tablet Take 1 tablet (100 mg total) by mouth 2 (two) times daily. 20 tablet 0  . ezetimibe (ZETIA) 10 MG tablet TAKE 1 TABLET(10 MG) BY MOUTH DAILY 90 tablet 3  . fluticasone (FLONASE) 50 MCG/ACT nasal spray SHAKE LIQUID AND USE 1 SPRAY IN EACH NOSTRIL DAILY 16 g 3  . HYDROcodone-acetaminophen (NORCO/VICODIN) 5-325 MG tablet Take 1 tablet by mouth daily as needed.  0  . hydroxychloroquine (PLAQUENIL) 200 MG tablet Take 400 mg by mouth daily.    . metoprolol succinate (TOPROL-XL) 25 MG 24 hr tablet TAKE 1 TABLET(25 MG) BY MOUTH DAILY 30 tablet 11  . montelukast (SINGULAIR) 10 MG tablet TAKE 1 TABLET(10 MG) BY MOUTH AT BEDTIME 30 tablet 4  . Multiple Vitamin (MULTIVITAMIN WITH MINERALS) TABS tablet Take 1 tablet by mouth daily.    . nicotine  (NICODERM CQ - DOSED IN MG/24 HOURS) 14 mg/24hr patch Place 1 patch (14 mg total) onto the skin daily. 28 patch 1  . oxybutynin (DITROPAN) 5 MG tablet TAKE 1/2 TABLET(2.5 MG) BY MOUTH TWICE DAILY 90 tablet 3  . pantoprazole (PROTONIX) 40 MG tablet TAKE 1 TABLET(40 MG) BY MOUTH DAILY 30 tablet 5  . potassium chloride SA (KLOR-CON) 20 MEQ tablet Take 20 mEq by mouth daily.    . rosuvastatin (CRESTOR) 20 MG tablet Take one tablet by mouth every Monday, Wednesday, Friday and Sunday 48 tablet 6  . torsemide (DEMADEX) 20 MG tablet Take 0.5 tablets (10 mg total) by mouth daily. 45 tablet 3  . UNABLE TO FIND Under pads use as needed  Pullups use as needed   Length of need- 99 months DX urinary incontinence 100 each 11  . UNABLE TO FIND Incontinence briefs and pads Dx incontinence 100 each 11   No current facility-administered medications for this visit.    Review of Systems  Constitutional:  Constitutional negative. Eyes: Eyes negative.  Respiratory: Respiratory negative.  Cardiovascular: Positive for leg swelling.  GI: Gastrointestinal negative.  Musculoskeletal: Musculoskeletal negative. Positive for leg pain.  Skin: Positive for wound.  Neurological: Neurological negative. Hematologic: Hematologic/lymphatic negative.  Psychiatric: Psychiatric negative.        Objective:  Objective   Vitals:   01/18/20 1542  BP: (!) 156/76  Pulse: 72  Resp: 20  Temp: 97.6 F (36.4 C)  SpO2: 95%   There is no height or weight on file to calculate BMI.  Physical Exam HENT:     Head: Normocephalic.  Cardiovascular:     Pulses:          Femoral pulses are 2+ on the right side and 2+ on the left side.      Popliteal pulses are 1+ on the left side.  Pulmonary:     Effort: Pulmonary effort is normal.  Abdominal:     General: Abdomen is flat.     Palpations: Abdomen is soft.  Skin:    Comments: Ulcers on right foot  Neurological:     General: No focal deficit present.     Mental Status:  She is alert.  Psychiatric:        Mood and Affect: Mood normal.     Data: I have independent interpreted her ABIs to be 0 and monophasic on the right on the left ABIs biphasic 1.01 with toe pressure of 67.  Assessment/Plan:     64 year old female with previous stroke with dependent edema of the right lower extremity ABI of 0.  She is nonverbal.  She does have a wound for the past 2 months currently on antibiotics.  We will begin with aortogram right lower extremity intervention from a left common femoral approach.      Waynetta Sandy MD [facility]

## 2020-01-18 NOTE — Progress Notes (Signed)
   Danielle Cruz     MRN: [id number]      DOB: 11/18/1955   HPI Danielle Cruz is here with a 1 weeek h/o recurrent ulceration and skin breakdown on right leg. New areas of breakdown are present. No direct inciting trauma. No fever or chills. Positive drainage from wounds   ROS Denies recent fever or chills. Denies sinus pressure, nasal congestion, ear pain or sore throat. Denies chest congestion, productive cough or wheezing. Denies chest pains, palpitations and leg swelling Denies abdominal pain, nausea, vomiting,diarrhea or constipation.   .   PE  BP (!) 146/77 (BP Location: Left Arm, Patient Position: Sitting)   Pulse 80   Resp 18   Ht 5\' 7"  (1.702 m)   Wt 184 lb (83.5 kg)   BMI 28.82 kg/m   Patient alert and oriented and in no cardiopulmonary distress.Appears to be in pain  HEENT: No facial asymmetry, EOMI,     Neck decreased ROM.  Chest: Clear to auscultation bilaterally.  CVS: S1, S2 no murmurs, no S3.Regular rate.  ABD: Soft non tender.   Ext: No edema  MS: decreased ROM spine, shoulders, hips and knees.  Skin: hyperpigmentation of lower extremities with superficial ulcers on right.  Psych: Good eye contact, normal affect.  anxious   Assessment & Plan  Cellulitis RLE recurrent skin breakdown Rocephin 500mg  IM followed by 1 1 week of cleocin , clean and dress and refer vascular for evaluation soonest available  Cellulitis of right ankle Recurrent cellulitis with ulceration, compromised circulation. Rocephin 500 mg im followed by cleoin  Refer tovascular surgery for evaluation  PVD (peripheral vascular disease) ([place]) Skin changes c/w PVD with recurrent skin breakdown, Vascular to evaluate  Hypertension Elevated at visit, no med change, pt in pain, will re assess   Tobacco abuse Reports reduce nicotine use 5 or less / day, trying to quit. Importance ofquitting to help circulation and reduce skin ulceration is discussed Asked:confirms currently smokes  cigarettes Assess: Unwilling to set a quit date, but is cutting back Advise: needs to QUIT to reduce risk of cancer, cardio and cerebrovascular disease Assist: counseled for 5 minutes and literature provided Arrange: follow up in 2 to 4 months' '

## 2020-01-18 NOTE — H&P (View-Only) (Signed)
Patient ID: Danielle Cruz, female   DOB: [birth year], 64 y.o.   MRN: [id number]  Reason for Consult: New Patient (Initial Visit)   Referred by Fayrene Helper, MD  Subjective:     HPI:  Danielle Cruz is a 64 y.o. female history of stroke now she is nonverbal she has residual right-sided weakness.  She is mostly in a wheelchair she does use her left leg to transfer.  Right leg has a wound for 2 months approximately.  She does not have any fevers or chills.  She is taking antibiotics at this time.  Risk factors for vascular disease include hyperlipidemia, hypertension and she is a current everyday smoker.  She has dependent edema of the right lower extremity.  Past Medical History:  Diagnosis Date  . AKI (acute kidney injury) ([place]) 09/22/2017  . Anemia   . 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 Arthritis   . CHF (congestive heart failure) ([place]) 10/02/2019  . Colonic polyp 2010   Hemorrhoids; h/o mild hematochezia  . CVA (cerebral infarction) 08/2008   sizable left -residual expressive aphasia, right sided weakness; ambulates with difficulty with the right leg brace   . Hyperlipidemia   . Hypertension 2005  . Rheumatoid arthritis(714.0)   . Stroke [facility])    Family History  Problem Relation Age of Onset  . Cancer Father        prostate, deceased 63s  . Breast cancer Sister        Deceased 29s  . Heart attack Mother        deceased 98, during childbirth  . Cancer Brother        lymphoma  . Colon cancer Maternal Aunt        older than age 70  . Liver disease Neg Hx    Past Surgical History:  Procedure Laterality Date  . ANKLE SURGERY     Right  . CAROTID STENT INSERTION    . COLONOSCOPY W/ POLYPECTOMY  11/2008   Dr. Gala Romney. Left-sided diverticula, Pedunculated polyp snared (no adenomatous changes)  . COLONOSCOPY WITH PROPOFOL N/A 01/12/2018   Procedure: COLONOSCOPY WITH PROPOFOL;  Surgeon: Daneil Dolin,  MD;  [facility];  Service: Endoscopy;  Laterality: N/A;  . FEMUR IM NAIL Right 09/23/2017   Procedure: INTRAMEDULLARY (IM) NAIL FEMORAL;  Surgeon: Renette Butters, MD;  Location: [place];  Service: Orthopedics;  Laterality: Right;  . FRACTURE SURGERY     Hip replacement in May 2019 - fall related   . OOPHORECTOMY      Short Social History:  Social History   Tobacco Use  . Smoking status: Current Every Day Smoker    Packs/day: 0.50    Years: 15.00    Pack years: 7.50    Types: Cigarettes  . Smokeless tobacco: Never Used  . Tobacco comment: quit after hospitized for hip fracture   Substance Use Topics  . Alcohol use: No    Alcohol/week: 0.0 standard drinks    Comment: quit 6 years ago     Allergies  Allergen Reactions  . Ace Inhibitors Cough    New daily cough since starting ACE    Current Outpatient Medications  Medication Sig Dispense Refill  . amLODipine (NORVASC) 5 MG tablet Take 1 tablet (5 mg total) by mouth daily. 30 tablet 3  . aspirin EC 81 MG tablet Take 81 mg by mouth  daily.    . clindamycin (CLEOCIN) 300 MG capsule Take one capsule by mouth three times daily for 10 days 30 capsule 0  . doxycycline (VIBRA-TABS) 100 MG tablet Take 1 tablet (100 mg total) by mouth 2 (two) times daily. 20 tablet 0  . ezetimibe (ZETIA) 10 MG tablet TAKE 1 TABLET(10 MG) BY MOUTH DAILY 90 tablet 3  . fluticasone (FLONASE) 50 MCG/ACT nasal spray SHAKE LIQUID AND USE 1 SPRAY IN EACH NOSTRIL DAILY 16 g 3  . HYDROcodone-acetaminophen (NORCO/VICODIN) 5-325 MG tablet Take 1 tablet by mouth daily as needed.  0  . hydroxychloroquine (PLAQUENIL) 200 MG tablet Take 400 mg by mouth daily.    . metoprolol succinate (TOPROL-XL) 25 MG 24 hr tablet TAKE 1 TABLET(25 MG) BY MOUTH DAILY 30 tablet 11  . montelukast (SINGULAIR) 10 MG tablet TAKE 1 TABLET(10 MG) BY MOUTH AT BEDTIME 30 tablet 4  . Multiple Vitamin (MULTIVITAMIN WITH MINERALS) TABS tablet Take 1 tablet by mouth daily.    . nicotine  (NICODERM CQ - DOSED IN MG/24 HOURS) 14 mg/24hr patch Place 1 patch (14 mg total) onto the skin daily. 28 patch 1  . oxybutynin (DITROPAN) 5 MG tablet TAKE 1/2 TABLET(2.5 MG) BY MOUTH TWICE DAILY 90 tablet 3  . pantoprazole (PROTONIX) 40 MG tablet TAKE 1 TABLET(40 MG) BY MOUTH DAILY 30 tablet 5  . potassium chloride SA (KLOR-CON) 20 MEQ tablet Take 20 mEq by mouth daily.    . rosuvastatin (CRESTOR) 20 MG tablet Take one tablet by mouth every Monday, Wednesday, Friday and Sunday 48 tablet 6  . torsemide (DEMADEX) 20 MG tablet Take 0.5 tablets (10 mg total) by mouth daily. 45 tablet 3  . UNABLE TO FIND Under pads use as needed  Pullups use as needed   Length of need- 99 months DX urinary incontinence 100 each 11  . UNABLE TO FIND Incontinence briefs and pads Dx incontinence 100 each 11   No current facility-administered medications for this visit.    Review of Systems  Constitutional:  Constitutional negative. Eyes: Eyes negative.  Respiratory: Respiratory negative.  Cardiovascular: Positive for leg swelling.  GI: Gastrointestinal negative.  Musculoskeletal: Musculoskeletal negative. Positive for leg pain.  Skin: Positive for wound.  Neurological: Neurological negative. Hematologic: Hematologic/lymphatic negative.  Psychiatric: Psychiatric negative.        Objective:  Objective   Vitals:   01/18/20 1542  BP: (!) 156/76  Pulse: 72  Resp: 20  Temp: 97.6 F (36.4 C)  SpO2: 95%   There is no height or weight on file to calculate BMI.  Physical Exam HENT:     Head: Normocephalic.  Cardiovascular:     Pulses:          Femoral pulses are 2+ on the right side and 2+ on the left side.      Popliteal pulses are 1+ on the left side.  Pulmonary:     Effort: Pulmonary effort is normal.  Abdominal:     General: Abdomen is flat.     Palpations: Abdomen is soft.  Skin:    Comments: Ulcers on right foot  Neurological:     General: No focal deficit present.     Mental Status:  She is alert.  Psychiatric:        Mood and Affect: Mood normal.     Data: I have independent interpreted her ABIs to be 0 and monophasic on the right on the left ABIs biphasic 1.01 with toe pressure of 67.  Assessment/Plan:     64 year old female with previous stroke with dependent edema of the right lower extremity ABI of 0.  She is nonverbal.  She does have a wound for the past 2 months currently on antibiotics.  We will begin with aortogram right lower extremity intervention from a left common femoral approach.      Waynetta Sandy MD [facility]

## 2020-01-18 NOTE — Assessment & Plan Note (Signed)
Skin changes c/w PVD with recurrent skin breakdown, Vascular to evaluate

## 2020-01-18 NOTE — Assessment & Plan Note (Signed)
Elevated at visit, no med change, pt in pain, will re assess

## 2020-01-18 NOTE — Assessment & Plan Note (Signed)
Recurrent cellulitis with ulceration, compromised circulation. Rocephin 500 mg im followed by cleoin  Refer tovascular surgery for evaluation

## 2020-01-26 ENCOUNTER — Other Ambulatory Visit (HOSPITAL_COMMUNITY)
Admission: RE | Admit: 2020-01-26 | Discharge: 2020-01-26 | Disposition: A | Discharge status: Home / Self Care | Payer: Medicare Other | Source: Ambulatory Visit | Attending: Vascular Surgery | Admitting: Vascular Surgery

## 2020-01-26 DIAGNOSIS — Z20822 Contact with and (suspected) exposure to covid-19: Secondary | ICD-10-CM | POA: Diagnosis not present

## 2020-01-26 DIAGNOSIS — Z01812 Encounter for preprocedural laboratory examination: Secondary | ICD-10-CM | POA: Insufficient documentation

## 2020-01-26 LAB — SARS CORONAVIRUS 2 (TAT 6-24 HRS): SARS Coronavirus 2: NEGATIVE

## 2020-01-28 ENCOUNTER — Ambulatory Visit (HOSPITAL_COMMUNITY)
Admission: RE | Admit: 2020-01-28 | Discharge: 2020-01-28 | Disposition: A | Discharge status: Home / Self Care | Payer: Medicare Other | Attending: Vascular Surgery | Admitting: Vascular Surgery

## 2020-01-28 ENCOUNTER — Other Ambulatory Visit: Payer: Self-pay

## 2020-01-28 ENCOUNTER — Encounter (HOSPITAL_COMMUNITY)
Admission: RE | Disposition: A | Discharge status: Home / Self Care | Payer: Self-pay | Source: Home / Self Care | Attending: Vascular Surgery

## 2020-01-28 DIAGNOSIS — L97519 Non-pressure chronic ulcer of other part of right foot with unspecified severity: Secondary | ICD-10-CM | POA: Insufficient documentation

## 2020-01-28 DIAGNOSIS — Z7982 Long term (current) use of aspirin: Secondary | ICD-10-CM | POA: Diagnosis not present

## 2020-01-28 DIAGNOSIS — M069 Rheumatoid arthritis, unspecified: Secondary | ICD-10-CM | POA: Diagnosis not present

## 2020-01-28 DIAGNOSIS — E785 Hyperlipidemia, unspecified: Secondary | ICD-10-CM | POA: Diagnosis not present

## 2020-01-28 DIAGNOSIS — Z8673 Personal history of transient ischemic attack (TIA), and cerebral infarction without residual deficits: Secondary | ICD-10-CM | POA: Insufficient documentation

## 2020-01-28 DIAGNOSIS — I70235 Atherosclerosis of native arteries of right leg with ulceration of other part of foot: Secondary | ICD-10-CM | POA: Diagnosis not present

## 2020-01-28 DIAGNOSIS — F17210 Nicotine dependence, cigarettes, uncomplicated: Secondary | ICD-10-CM | POA: Diagnosis not present

## 2020-01-28 DIAGNOSIS — I998 Other disorder of circulatory system: Secondary | ICD-10-CM | POA: Diagnosis not present

## 2020-01-28 DIAGNOSIS — I110 Hypertensive heart disease with heart failure: Secondary | ICD-10-CM | POA: Insufficient documentation

## 2020-01-28 DIAGNOSIS — Z79899 Other long term (current) drug therapy: Secondary | ICD-10-CM | POA: Insufficient documentation

## 2020-01-28 DIAGNOSIS — R600 Localized edema: Secondary | ICD-10-CM | POA: Diagnosis not present

## 2020-01-28 DIAGNOSIS — I509 Heart failure, unspecified: Secondary | ICD-10-CM | POA: Diagnosis not present

## 2020-01-28 HISTORY — PX: PERIPHERAL VASCULAR BALLOON ANGIOPLASTY: CATH118281

## 2020-01-28 HISTORY — PX: ABDOMINAL AORTOGRAM W/LOWER EXTREMITY: CATH118223

## 2020-01-28 LAB — POCT I-STAT, CHEM 8
BUN: 19 mg/dL (ref 8–23)
Calcium, Ion: 1.25 mmol/L (ref 1.15–1.40)
Chloride: 113 mmol/L — ABNORMAL HIGH (ref 98–111)
Creatinine, Ser: 1 mg/dL (ref 0.44–1.00)
Glucose, Bld: 84 mg/dL (ref 70–99)
HCT: 34 % — ABNORMAL LOW (ref 36.0–46.0)
Hemoglobin: 11.6 g/dL — ABNORMAL LOW (ref 12.0–15.0)
Potassium: 5 mmol/L (ref 3.5–5.1)
Sodium: 143 mmol/L (ref 135–145)
TCO2: 21 mmol/L — ABNORMAL LOW (ref 22–32)

## 2020-01-28 SURGERY — ABDOMINAL AORTOGRAM W/LOWER EXTREMITY
Anesthesia: LOCAL | Laterality: Right

## 2020-01-28 MED ORDER — MIDAZOLAM HCL 2 MG/2ML IJ SOLN
INTRAMUSCULAR | Status: DC | PRN
  Administered 2020-01-28: 1 mg via INTRAVENOUS

## 2020-01-28 MED ORDER — MORPHINE SULFATE (PF) 2 MG/ML IV SOLN
INTRAVENOUS | Status: AC
  Filled 2020-01-28: qty 1

## 2020-01-28 MED ORDER — SODIUM CHLORIDE 0.9 % IV SOLN: 250.0000 mL | INTRAVENOUS | Status: DC | PRN

## 2020-01-28 MED ORDER — IODIXANOL 320 MG/ML IV SOLN
INTRAVENOUS | Status: DC | PRN
  Administered 2020-01-28: 150 mL

## 2020-01-28 MED ORDER — LABETALOL HCL 5 MG/ML IV SOLN: 10.0000 mg | INTRAVENOUS | Status: DC | PRN

## 2020-01-28 MED ORDER — HEPARIN (PORCINE) IN NACL 1000-0.9 UT/500ML-% IV SOLN
INTRAVENOUS | Status: DC | PRN
  Administered 2020-01-28 (×2): 500 mL

## 2020-01-28 MED ORDER — LIDOCAINE HCL (PF) 1 % IJ SOLN
INTRAMUSCULAR | Status: DC | PRN
  Administered 2020-01-28: 12 mL

## 2020-01-28 MED ORDER — CLOPIDOGREL BISULFATE 300 MG PO TABS
ORAL_TABLET | ORAL | Status: DC | PRN
  Administered 2020-01-28: 300 mg via ORAL

## 2020-01-28 MED ORDER — ONDANSETRON HCL 4 MG/2ML IJ SOLN: 4.0000 mg | Freq: Four times a day (QID) | INTRAMUSCULAR | Status: DC | PRN

## 2020-01-28 MED ORDER — SODIUM CHLORIDE 0.9 % WEIGHT BASED INFUSION: 1.0000 mL/kg/h | INTRAVENOUS | Status: DC

## 2020-01-28 MED ORDER — LIDOCAINE HCL (PF) 1 % IJ SOLN
INTRAMUSCULAR | Status: AC
  Filled 2020-01-28: qty 30

## 2020-01-28 MED ORDER — FENTANYL CITRATE (PF) 100 MCG/2ML IJ SOLN
INTRAMUSCULAR | Status: DC | PRN
Start: 2020-01-28 — End: 2020-01-28
  Administered 2020-01-28 (×2): 50 ug via INTRAVENOUS

## 2020-01-28 MED ORDER — SODIUM CHLORIDE 0.9% FLUSH: 3.0000 mL | Freq: Two times a day (BID) | INTRAVENOUS | Status: DC

## 2020-01-28 MED ORDER — LABETALOL HCL 5 MG/ML IV SOLN
INTRAVENOUS | Status: DC | PRN
  Administered 2020-01-28 (×2): 10 mg via INTRAVENOUS

## 2020-01-28 MED ORDER — ACETAMINOPHEN 325 MG PO TABS: 650.0000 mg | ORAL_TABLET | ORAL | Status: DC | PRN

## 2020-01-28 MED ORDER — MORPHINE SULFATE (PF) 2 MG/ML IV SOLN: 2.0000 mg | INTRAVENOUS | Status: DC | PRN

## 2020-01-28 MED ORDER — HYDRALAZINE HCL 20 MG/ML IJ SOLN: 5.0000 mg | INTRAMUSCULAR | Status: DC | PRN

## 2020-01-28 MED ORDER — MIDAZOLAM HCL 2 MG/2ML IJ SOLN
INTRAMUSCULAR | Status: AC
  Filled 2020-01-28: qty 2

## 2020-01-28 MED ORDER — CLOPIDOGREL BISULFATE 300 MG PO TABS
ORAL_TABLET | ORAL | Status: AC
  Filled 2020-01-28: qty 1

## 2020-01-28 MED ORDER — HEPARIN (PORCINE) IN NACL 1000-0.9 UT/500ML-% IV SOLN
INTRAVENOUS | Status: AC
  Filled 2020-01-28: qty 1000

## 2020-01-28 MED ORDER — HEPARIN SODIUM (PORCINE) 1000 UNIT/ML IJ SOLN
INTRAMUSCULAR | Status: DC | PRN
  Administered 2020-01-28: 8000 [IU] via INTRAVENOUS

## 2020-01-28 MED ORDER — CLOPIDOGREL BISULFATE 75 MG PO TABS: 75.0000 mg | ORAL_TABLET | Freq: Every day | ORAL | Status: DC

## 2020-01-28 MED ORDER — HEPARIN SODIUM (PORCINE) 1000 UNIT/ML IJ SOLN
INTRAMUSCULAR | Status: AC
  Filled 2020-01-28: qty 1

## 2020-01-28 MED ORDER — LABETALOL HCL 5 MG/ML IV SOLN
INTRAVENOUS | Status: AC
  Filled 2020-01-28: qty 4

## 2020-01-28 MED ORDER — CLOPIDOGREL BISULFATE 75 MG PO TABS: 300.0000 mg | ORAL_TABLET | Freq: Once | ORAL | Status: DC

## 2020-01-28 MED ORDER — FENTANYL CITRATE (PF) 100 MCG/2ML IJ SOLN
INTRAMUSCULAR | Status: AC
  Filled 2020-01-28: qty 2

## 2020-01-28 MED ORDER — OXYCODONE HCL 5 MG PO TABS: 5.0000 mg | ORAL_TABLET | ORAL | Status: DC | PRN

## 2020-01-28 MED ORDER — CLOPIDOGREL BISULFATE 75 MG PO TABS: 75.0000 mg | ORAL_TABLET | Freq: Every day | ORAL | 11 refills | Status: DC

## 2020-01-28 MED ORDER — SODIUM CHLORIDE 0.9 % IV SOLN: INTRAVENOUS | Status: DC

## 2020-01-28 MED ORDER — SODIUM CHLORIDE 0.9% FLUSH: 3.0000 mL | INTRAVENOUS | Status: DC | PRN

## 2020-01-28 MED ORDER — MORPHINE SULFATE (PF) 2 MG/ML IV SOLN
2.0000 mg | Freq: Once | INTRAVENOUS | Status: AC
  Administered 2020-01-28: 2 mg via INTRAVENOUS

## 2020-01-28 SURGICAL SUPPLY — 20 items
BALLN STERLING OTW 5X40X135 (BALLOONS) ×3
BALLOON STERLING OTW 5X40X135 (BALLOONS) IMPLANT
CATH OMNI FLUSH 5F 65CM (CATHETERS) ×1 IMPLANT
CATH TEMPO AQUA 5F 100CM (CATHETERS) ×1 IMPLANT
CLOSURE MYNX CONTROL 6F/7F (Vascular Products) ×1 IMPLANT
DCB RANGER 5.0X40 135 (BALLOONS) IMPLANT
GLIDEWIRE ADV .035X260CM (WIRE) ×1 IMPLANT
GUIDEWIRE ZILIENT 6G 018 (WIRE) ×1 IMPLANT
KIT ENCORE 26 ADVANTAGE (KITS) ×1 IMPLANT
KIT MICROPUNCTURE NIT STIFF (SHEATH) ×1 IMPLANT
KIT PV (KITS) ×3 IMPLANT
RANGER DCB 5.0X40 135 (BALLOONS) ×6
SHEATH FLEX ANSEL ANG 6F 45CM (SHEATH) ×1 IMPLANT
SHEATH PINNACLE 5F 10CM (SHEATH) ×1 IMPLANT
SHEATH PINNACLE 6F 10CM (SHEATH) ×1 IMPLANT
SHEATH PROBE COVER 6X72 (BAG) ×1 IMPLANT
SYR MEDRAD MARK V 150ML (SYRINGE) ×1 IMPLANT
TRANSDUCER W/STOPCOCK (MISCELLANEOUS) ×3 IMPLANT
TRAY PV CATH (CUSTOM PROCEDURE TRAY) ×3 IMPLANT
WIRE BENTSON .035X145CM (WIRE) ×1 IMPLANT

## 2020-01-28 NOTE — Progress Notes (Signed)
Report given to Jorge Mandril who assumes care at this time

## 2020-01-28 NOTE — Discharge Instructions (Signed)

## 2020-01-28 NOTE — Op Note (Signed)
    Patient name: Danielle Cruz MRN: [id number] DOB: 01/26/1956 Sex: female  01/28/2020 Pre-operative Diagnosis: Critical right lower extremity ischemia with wounds Post-operative diagnosis:  Same Surgeon:  Erlene Quan C. Donzetta Matters, MD Procedure Performed: 1.  Ultrasound-guided cannulation left common femoral artery 2.  Aortogram with bilateral lower extremity runoff 3.  Drug-coated balloon angioplasty right popliteal and SFA with two 5 x 40 mm Ranger balloons 4.  Minx device closure left common femoral artery 5.  Moderate sedation with fentanyl Versed for 7 minutes   Indications: 64 year old female with history of stroke she is now nonverbal has limited motion of the right lower extremity.  She has persistent swelling in the right leg she has decreased ABIs she is indicated for aortogram due to wounds on the right lower extremity.  Findings: Aortoiliac segments are free of flow-limiting stenosis.  The left common femoral reaccessed appears she has heavy calcific disease extending into the proximal SFA.  Right lower extremity which is the site of interest SFA has 1 area at the distal SFA proximal 80% stenosis after angioplasty is down to 0%.  Popliteal artery at the joint demonstrates 80% stenosis resolved to 0%.  She has single-vessel runoff on the right via anterior tibial artery.  The left side she does have heavy calcified disease in the common femoral artery extending in the proximal SFA no flow-limiting stenosis distally appears to have 3 vessels running to the foot.   Procedure:  The patient was identified in the holding area and taken to room 8.  The patient was then placed supine on the table and prepped and draped in the usual sterile fashion.  A time out was called.  Ultrasound was used to evaluate the left common femoral artery.  This was noted to be heavily calcified distally leading into the SFA and profunda.  Proximally it was soft the areas anesthetized 1% lidocaine cannulated with  micropuncture needle followed by wire sheath.  Bentson wire was placed to pass easily replaced 5 [place] sheath followed by Omni catheter to the level of L1.  We performed aortogram followed bilateral lower extremity runoff.  With the above findings we crossed the bifurcation with Glidewire advantage place a straight catheter down to the right SFA and performed further imaging.  We then exchanged for a long 6 French sheath patient was fully heparinized.  We then crossed the lesion with Glidewire advantage.  We placed an 018 wire after confirming distal intraluminal access.  We then primarily ballooned dilated the popliteal at the knee and distal SFA.  These lesions did not demonstrate any dissection or flow limitation we then use drug-coated balloons inflated nominal pressure for 3 minutes 5 x 40 mm Ranger.  Completion demonstrated no residual stenosis or dissection.  There is runoff via the anterior tibial artery is the dominant vessel.  She did tolerate procedure without any complication.    Contrast: 150cc  Annaleigha Woo C. Donzetta Matters, MD [facility] [place] Office: [phone] Pager: [phone]

## 2020-01-28 NOTE — Progress Notes (Signed)
Discharge instructions reviewed with pt and her husband. Both voice understanding.  

## 2020-01-28 NOTE — Interval H&P Note (Signed)
History and Physical Interval Note:  01/28/2020 10:38 AM  Danielle Cruz  has presented today for surgery, with the diagnosis of PVD.  The various methods of treatment have been discussed with the patient and family. After consideration of risks, benefits and other options for treatment, the patient has consented to  Procedure(s): ABDOMINAL AORTOGRAM W/LOWER EXTREMITY (Right) as a surgical intervention.  The patient's history has been reviewed, patient examined, no change in status, stable for surgery.  I have reviewed the patient's chart and labs.  Questions were answered to the patient's satisfaction.     Servando Snare

## 2020-01-29 ENCOUNTER — Encounter (HOSPITAL_COMMUNITY): Payer: Self-pay | Admitting: Vascular Surgery

## 2020-01-30 ENCOUNTER — Other Ambulatory Visit: Payer: Self-pay | Admitting: *Deleted

## 2020-01-30 DIAGNOSIS — I739 Peripheral vascular disease, unspecified: Secondary | ICD-10-CM

## 2020-01-31 HISTORY — PX: FOOT SURGERY: SHX648

## 2020-02-01 ENCOUNTER — Telehealth: Payer: Self-pay | Admitting: Cardiology

## 2020-02-01 ENCOUNTER — Encounter: Payer: Self-pay | Admitting: *Deleted

## 2020-02-01 NOTE — Telephone Encounter (Signed)
Per B.Strader, PAC, patient is advised to call [facility] at [phone]. family agrees to call them.

## 2020-02-01 NOTE — Progress Notes (Signed)
Dressing on right leg changed with petroleum gauze, abd, kerlex and ace. Daughter instructed on dressing changes and verbalized understanding.

## 2020-02-01 NOTE — Telephone Encounter (Signed)
New message   Patient had stents put in her legs on Monday - Patient is swollen all over especially in her legs and in pain  She cant lay down - they tried calling the surgeon who put the stents in can not get a response back

## 2020-02-07 ENCOUNTER — Ambulatory Visit: Payer: Medicare Other | Admitting: Family Medicine

## 2020-02-12 DIAGNOSIS — L97919 Non-pressure chronic ulcer of unspecified part of right lower leg with unspecified severity: Secondary | ICD-10-CM | POA: Diagnosis not present

## 2020-02-12 DIAGNOSIS — Z7902 Long term (current) use of antithrombotics/antiplatelets: Secondary | ICD-10-CM | POA: Diagnosis not present

## 2020-02-12 DIAGNOSIS — Z792 Long term (current) use of antibiotics: Secondary | ICD-10-CM | POA: Diagnosis not present

## 2020-02-12 DIAGNOSIS — L97519 Non-pressure chronic ulcer of other part of right foot with unspecified severity: Secondary | ICD-10-CM | POA: Diagnosis not present

## 2020-02-12 DIAGNOSIS — M7989 Other specified soft tissue disorders: Secondary | ICD-10-CM | POA: Diagnosis not present

## 2020-02-12 DIAGNOSIS — I110 Hypertensive heart disease with heart failure: Secondary | ICD-10-CM | POA: Diagnosis not present

## 2020-02-12 DIAGNOSIS — M19071 Primary osteoarthritis, right ankle and foot: Secondary | ICD-10-CM | POA: Diagnosis not present

## 2020-02-12 DIAGNOSIS — Z8679 Personal history of other diseases of the circulatory system: Secondary | ICD-10-CM | POA: Diagnosis not present

## 2020-02-12 DIAGNOSIS — Z79899 Other long term (current) drug therapy: Secondary | ICD-10-CM | POA: Diagnosis not present

## 2020-02-12 DIAGNOSIS — M069 Rheumatoid arthritis, unspecified: Secondary | ICD-10-CM | POA: Diagnosis not present

## 2020-02-12 DIAGNOSIS — Z8673 Personal history of transient ischemic attack (TIA), and cerebral infarction without residual deficits: Secondary | ICD-10-CM | POA: Diagnosis not present

## 2020-02-12 DIAGNOSIS — S81801A Unspecified open wound, right lower leg, initial encounter: Secondary | ICD-10-CM | POA: Diagnosis not present

## 2020-02-12 DIAGNOSIS — Z86010 Personal history of colonic polyps: Secondary | ICD-10-CM | POA: Diagnosis not present

## 2020-02-12 DIAGNOSIS — F17210 Nicotine dependence, cigarettes, uncomplicated: Secondary | ICD-10-CM | POA: Diagnosis not present

## 2020-02-12 DIAGNOSIS — I509 Heart failure, unspecified: Secondary | ICD-10-CM | POA: Diagnosis not present

## 2020-02-12 DIAGNOSIS — I739 Peripheral vascular disease, unspecified: Secondary | ICD-10-CM | POA: Diagnosis not present

## 2020-02-12 DIAGNOSIS — E785 Hyperlipidemia, unspecified: Secondary | ICD-10-CM | POA: Diagnosis not present

## 2020-02-12 DIAGNOSIS — Z7982 Long term (current) use of aspirin: Secondary | ICD-10-CM | POA: Diagnosis not present

## 2020-02-12 DIAGNOSIS — I2510 Atherosclerotic heart disease of native coronary artery without angina pectoris: Secondary | ICD-10-CM | POA: Diagnosis not present

## 2020-02-12 DIAGNOSIS — Z96649 Presence of unspecified artificial hip joint: Secondary | ICD-10-CM | POA: Diagnosis not present

## 2020-02-14 ENCOUNTER — Ambulatory Visit (INDEPENDENT_AMBULATORY_CARE_PROVIDER_SITE_OTHER): Payer: Medicare Other

## 2020-02-14 ENCOUNTER — Other Ambulatory Visit: Payer: Self-pay

## 2020-02-14 DIAGNOSIS — I7025 Atherosclerosis of native arteries of other extremities with ulceration: Secondary | ICD-10-CM | POA: Insufficient documentation

## 2020-02-14 DIAGNOSIS — Z0000 Encounter for general adult medical examination without abnormal findings: Secondary | ICD-10-CM | POA: Diagnosis not present

## 2020-02-14 NOTE — Patient Instructions (Addendum)
Danielle Cruz , Thank you for taking time to come for your Medicare Wellness Visit. I appreciate your ongoing commitment to your health goals. Please review the following plan we discussed and let me know if I can assist you in the future.   Screening recommendations/referrals: Colonoscopy: Up to date; DUE 01/11/28  Mammogram: Overdue, and unable to perform due to unable to stand for procedure.  Bone Density: Complete   Recommended yearly ophthalmology/optometry visit for glaucoma screening and checkup Recommended yearly dental visit for hygiene and checkup  Vaccinations: Influenza vaccine: Complete  Pneumococcal vaccine: Complete  Tdap vaccine: Complete; DUE on 01/10/21  Shingles vaccine: Eligible, will send education with AVS by mail. Can check local pharmacy for coverage of this vaccine.     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 Recent foot surgery due to blockage, stents were placed. Call if swelling persists or site becomes red or hot to touch.   Next appointment: 05-14-20 @ 11:00am with Dr. Moshe Cipro    Preventive Care 21 Years and Older, Female Preventive care refers to lifestyle choices and visits with your health care provider that can promote health and wellness. What does preventive care include?  A yearly physical exam. This is also called an annual well check.  Dental exams once or twice a year.  Routine eye exams. Ask your health care provider how often you should have your eyes checked.  Personal lifestyle choices, including:  Daily care of your teeth and gums.  Regular physical activity.  Eating a healthy diet.  Avoiding tobacco and drug use.  Limiting alcohol use.  Practicing safe sex.  Taking low-dose aspirin every day.  Taking vitamin and mineral supplements as recommended by your health care provider. What happens during an annual well check? The services and screenings  done by your health care provider during your annual well check will depend on your age, overall health, lifestyle risk factors, and family history of disease. Counseling  Your health care provider may ask you questions about your:  Alcohol use.  Tobacco use.  Drug use.  Emotional well-being.  Home and relationship well-being.  Sexual activity.  Eating habits.  History of falls.  Memory and ability to understand (cognition).  Work and work Statistician.  Reproductive health. Screening  You may have the following tests or measurements:  Height, weight, and BMI.  Blood pressure.  Lipid and cholesterol levels. These may be checked every 5 years, or more frequently if you are over 95 years old.  Skin check.  Lung cancer screening. You may have this screening every year starting at age 54 if you have a 30-pack-year history of smoking and currently smoke or have quit within the past 15 years.  Fecal occult blood test (FOBT) of the stool. You may have this test every year starting at age 58.  Flexible sigmoidoscopy or colonoscopy. You may have a sigmoidoscopy every 5 years or a colonoscopy every 10 years starting at age 84.  Hepatitis C blood test.  Hepatitis B blood test.  Sexually transmitted disease (STD) testing.  Diabetes screening. This is done by checking your blood sugar (glucose) after you have not eaten for a while (fasting). You may have this done every 1-3 years.  Bone density scan. This is done to screen for osteoporosis. You may have this done starting at age 89.  Mammogram. This may be done every 1-2 years. Talk to your health care provider  about how often you should have regular mammograms. Talk with your health care provider about your test results, treatment options, and if necessary, the need for more tests. Vaccines  Your health care provider may recommend certain vaccines, such as:  Influenza vaccine. This is recommended every year.  Tetanus,  diphtheria, and acellular pertussis (Tdap, Td) vaccine. You may need a Td booster every 10 years.  Zoster vaccine. You may need this after age 28.  Pneumococcal 13-valent conjugate (PCV13) vaccine. One dose is recommended after age 36.  Pneumococcal polysaccharide (PPSV23) vaccine. One dose is recommended after age 77. Talk to your health care provider about which screenings and vaccines you need and how often you need them. This information is not intended to replace advice given to you by your health care provider. Make sure you discuss any questions you have with your health care provider. Document Released: 05/02/2015 Document Revised: 12/24/2015 Document Reviewed: 02/04/2015 Elsevier Interactive Patient Education  2017 [place] Prevention in the Home Falls can cause injuries. They can happen to people of all ages. There are many things you can do to make your home safe and to help prevent falls. What can I do on the outside of my home?  Regularly fix the edges of walkways and driveways and fix any cracks.  Remove anything that might make you trip as you walk through a door, such as a raised step or threshold.  Trim any bushes or trees on the path to your home.  Use bright outdoor lighting.  Clear any walking paths of anything that might make someone trip, such as rocks or tools.  Regularly check to see if handrails are loose or broken. Make sure that both sides of any steps have handrails.  Any raised decks and porches should have guardrails on the edges.  Have any leaves, snow, or ice cleared regularly.  Use sand or salt on walking paths during winter.  Clean up any spills in your garage right away. This includes oil or grease spills. What can I do in the bathroom?  Use night lights.  Install grab bars by the toilet and in the tub and shower. Do not use towel bars as grab bars.  Use non-skid mats or decals in the tub or shower.  If you need to sit down in  the shower, use a plastic, non-slip stool.  Keep the floor dry. Clean up any water that spills on the floor as soon as it happens.  Remove soap buildup in the tub or shower regularly.  Attach bath mats securely with double-sided non-slip rug tape.  Do not have throw rugs and other things on the floor that can make you trip. What can I do in the bedroom?  Use night lights.  Make sure that you have a light by your bed that is easy to reach.  Do not use any sheets or blankets that are too big for your bed. They should not hang down onto the floor.  Have a firm chair that has side arms. You can use this for support while you get dressed.  Do not have throw rugs and other things on the floor that can make you trip. What can I do in the kitchen?  Clean up any spills right away.  Avoid walking on wet floors.  Keep items that you use a lot in easy-to-reach places.  If you need to reach something above you, use a strong step stool that has a grab bar.  Keep electrical cords out of the way.  Do not use floor polish or wax that makes floors slippery. If you must use wax, use non-skid floor wax.  Do not have throw rugs and other things on the floor that can make you trip. What can I do with my stairs?  Do not leave any items on the stairs.  Make sure that there are handrails on both sides of the stairs and use them. Fix handrails that are broken or loose. Make sure that handrails are as long as the stairways.  Check any carpeting to make sure that it is firmly attached to the stairs. Fix any carpet that is loose or worn.  Avoid having throw rugs at the top or bottom of the stairs. If you do have throw rugs, attach them to the floor with carpet tape.  Make sure that you have a light switch at the top of the stairs and the bottom of the stairs. If you do not have them, ask someone to add them for you. What else can I do to help prevent falls?  Wear shoes that:  Do not have high  heels.  Have rubber bottoms.  Are comfortable and fit you well.  Are closed at the toe. Do not wear sandals.  If you use a stepladder:  Make sure that it is fully opened. Do not climb a closed stepladder.  Make sure that both sides of the stepladder are locked into place.  Ask someone to hold it for you, if possible.  Clearly mark and make sure that you can see:  Any grab bars or handrails.  First and last steps.  Where the edge of each step is.  Use tools that help you move around (mobility aids) if they are needed. These include:  Canes.  Walkers.  Scooters.  Crutches.  Turn on the lights when you go into a dark area. Replace any light bulbs as soon as they burn out.  Set up your furniture so you have a clear path. Avoid moving your furniture around.  If any of your floors are uneven, fix them.  If there are any pets around you, be aware of where they are.  Review your medicines with your doctor. Some medicines can make you feel dizzy. This can increase your chance of falling. Ask your doctor what other things that you can do to help prevent falls. This information is not intended to replace advice given to you by your health care provider. Make sure you discuss any questions you have with your health care provider. Document Released: 01/30/2009 Document Revised: 09/11/2015 Document Reviewed: 05/10/2014 Elsevier Interactive Patient Education  2017 [employer].

## 2020-02-14 NOTE — Progress Notes (Signed)
Subjective:   Danielle Cruz is a 64 y.o. female who presents for Medicare Annual (Subsequent) preventive examination.       Objective:    There were no vitals filed for this visit. There is no height or weight on file to calculate BMI.  Advanced Directives 01/28/2020 12/22/2019 12/21/2019 10/01/2019 02/13/2019 01/11/2018 10/03/2017  Does Patient Have a Medical Advance Directive? Yes No No No No No Yes  Type of Advance Directive - - - - - - Healthcare Power of Attorney  Does patient want to make changes to medical advance directive? No - Patient declined - - - - - No - Patient declined  Copy of [place] in Chart? - - - - - - No - copy requested  Would patient like information on creating a medical advance directive? - No - Patient declined - - No - Guardian declined Yes (Inpatient - patient requests chaplain consult to create a medical advance directive) No - Patient declined    Current Medications (verified) Outpatient Encounter Medications as of 02/14/2020  Medication Sig  . acetaminophen (TYLENOL) 325 MG tablet Take 650 mg by mouth every 6 (six) hours as needed for mild pain or moderate pain.  Marland Kitchen amLODipine (NORVASC) 5 MG tablet Take 1 tablet (5 mg total) by mouth daily.  Marland Kitchen aspirin EC 81 MG tablet Take 81 mg by mouth daily.  . clindamycin (CLEOCIN) 300 MG capsule Take one capsule by mouth three times daily for 10 days (Patient taking differently: Take 300 mg by mouth 3 (three) times daily. )  . clopidogrel (PLAVIX) 75 MG tablet Take 1 tablet (75 mg total) by mouth daily.  Marland Kitchen ezetimibe (ZETIA) 10 MG tablet TAKE 1 TABLET(10 MG) BY MOUTH DAILY (Patient taking differently: Take 10 mg by mouth daily. )  . fluticasone (FLONASE) 50 MCG/ACT nasal spray SHAKE LIQUID AND USE 1 SPRAY IN EACH NOSTRIL DAILY (Patient taking differently: Place 1 spray into both nostrils daily as needed for allergies. )  . HYDROcodone-acetaminophen (NORCO/VICODIN) 5-325 MG tablet Take 1 tablet by mouth  daily as needed for moderate pain.   . hydroxychloroquine (PLAQUENIL) 200 MG tablet Take 400 mg by mouth daily.  . metoprolol succinate (TOPROL-XL) 25 MG 24 hr tablet TAKE 1 TABLET(25 MG) BY MOUTH DAILY (Patient taking differently: Take 25 mg by mouth daily. )  . montelukast (SINGULAIR) 10 MG tablet TAKE 1 TABLET(10 MG) BY MOUTH AT BEDTIME (Patient taking differently: Take 10 mg by mouth at bedtime. )  . Multiple Vitamin (MULTIVITAMIN WITH MINERALS) TABS tablet Take 1 tablet by mouth daily.  . nicotine (NICODERM CQ - DOSED IN MG/24 HOURS) 14 mg/24hr patch Place 1 patch (14 mg total) onto the skin daily.  Marland Kitchen oxybutynin (DITROPAN) 5 MG tablet TAKE 1/2 TABLET(2.5 MG) BY MOUTH TWICE DAILY (Patient taking differently: Take 2.5 mg by mouth 2 (two) times daily. )  . pantoprazole (PROTONIX) 40 MG tablet TAKE 1 TABLET(40 MG) BY MOUTH DAILY (Patient taking differently: Take 40 mg by mouth daily. )  . potassium chloride SA (KLOR-CON) 20 MEQ tablet Take 20 mEq by mouth daily.  . rosuvastatin (CRESTOR) 20 MG tablet Take one tablet by mouth every Monday, Wednesday, Friday and Sunday (Patient taking differently: Take 20 mg by mouth See admin instructions. Take 1 tablet (20mg ) by mouth every Monday, Wednesday, Friday and Sunday)  . torsemide (DEMADEX) 20 MG tablet Take 0.5 tablets (10 mg total) by mouth daily.  Marland Kitchen UNABLE TO FIND Under pads use  as needed  Pullups use as needed   Length of need- 99 months DX urinary incontinence (Patient not taking: Reported on 01/28/2020)  . UNABLE TO FIND Incontinence briefs and pads Dx incontinence (Patient not taking: Reported on 01/28/2020)   No facility-administered encounter medications on file as of 02/14/2020.    Allergies (verified) Ace inhibitors   History: Past Medical History:  Diagnosis Date  . AKI (acute kidney injury) ([place]) 09/22/2017  . Anemia   . 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  Arthritis   . CHF (congestive heart failure) ([place]) 10/02/2019  . Colonic polyp 2010   Hemorrhoids; h/o mild hematochezia  . CVA (cerebral infarction) 08/2008   sizable left -residual expressive aphasia, right sided weakness; ambulates with difficulty with the right leg brace   . Hyperlipidemia   . Hypertension 2005  . Rheumatoid arthritis(714.0)   . Stroke [facility])    Past Surgical History:  Procedure Laterality Date  . ABDOMINAL AORTOGRAM W/LOWER EXTREMITY Bilateral 01/28/2020   Procedure: ABDOMINAL AORTOGRAM W/LOWER EXTREMITY;  Surgeon: Waynetta Sandy, MD;  Location: [place] [facility];  Service: Cardiovascular;  Laterality: Bilateral;  . ANKLE SURGERY     Right  . CAROTID STENT INSERTION    . COLONOSCOPY W/ POLYPECTOMY  11/2008   Dr. Gala Romney. Left-sided diverticula, Pedunculated polyp snared (no adenomatous changes)  . COLONOSCOPY WITH PROPOFOL N/A 01/12/2018   Procedure: COLONOSCOPY WITH PROPOFOL;  Surgeon: Daneil Dolin, MD;  [facility];  Service: Endoscopy;  Laterality: N/A;  . FEMUR IM NAIL Right 09/23/2017   Procedure: INTRAMEDULLARY (IM) NAIL FEMORAL;  Surgeon: Renette Butters, MD;  Location: [place];  Service: Orthopedics;  Laterality: Right;  . FRACTURE SURGERY     Hip replacement in May 2019 - fall related   . OOPHORECTOMY    . PERIPHERAL VASCULAR BALLOON ANGIOPLASTY Right 01/28/2020   Procedure: PERIPHERAL VASCULAR BALLOON ANGIOPLASTY;  Surgeon: Waynetta Sandy, MD;  Location: [place] [facility];  Service: Cardiovascular;  Laterality: Right;  SFA   Family History  Problem Relation Age of Onset  . Cancer Father        prostate, deceased 93s  . Breast cancer Sister        Deceased 59s  . Heart attack Mother        deceased 28, during childbirth  . Cancer Brother        lymphoma  . Colon cancer Maternal Aunt        older than age 78  . Liver disease Neg Hx    Social History   Socioeconomic History  . Marital status: Married    Spouse  name: Not on file  . Number of children: 3  . Years of education: 11th grade  . Highest education level: 11th grade  Occupational History  . Occupation: disabilty x 9years    Comment: secondary to arthritis   Tobacco Use  . Smoking status: Current Every Day Smoker    Packs/day: 0.50    Years: 15.00    Pack years: 7.50    Types: Cigarettes  . Smokeless tobacco: Never Used  . Tobacco comment: quit after hospitized for hip fracture   Vaping Use  . Vaping Use: Never used  Substance and Sexual Activity  . Alcohol use: No    Alcohol/week: 0.0 standard drinks    Comment: quit 6 years ago   . Drug use: No  .  Sexual activity: Not on file  Other Topics Concern  . Not on file  Social History Narrative   Pt gave birth to 4 children, 1 deceased at 32 weeks was a premature baby, 3 are living ages range 21 to 58 all healthy    Social Determinants of Health   Financial Resource Strain:   . Difficulty of Paying Living Expenses: Not on file  Food Insecurity:   . Worried About Charity fundraiser in the Last Year: Not on file  . Ran Out of Food in the Last Year: Not on file  Transportation Needs:   . Lack of Transportation (Medical): Not on file  . Lack of Transportation (Non-Medical): Not on file  Physical Activity:   . Days of Exercise per Week: Not on file  . Minutes of Exercise per Session: Not on file  Stress:   . Feeling of Stress : Not on file  Social Connections:   . Frequency of Communication with Friends and Family: Not on file  . Frequency of Social Gatherings with Friends and Family: Not on file  . Attends Religious Services: Not on file  . Active Member of Clubs or Organizations: Not on file  . Attends Archivist Meetings: Not on file  . Marital Status: Not on file    Tobacco Counseling Ready to quit: Not Answered Counseling given: Not Answered Comment: quit after hospitized for hip fracture                   Diabetic? No           Activities of Daily Living In your present state of health, do you have any difficulty performing the following activities: 12/22/2019  Hearing? N  Vision? N  Difficulty concentrating or making decisions? Y  Walking or climbing stairs? Y  Dressing or bathing? Y  Doing errands, shopping? Y  Some recent data might be hidden    Patient Care Team: Fayrene Helper, MD as PCP - [place], Alphonse Guild, MD as PCP - Cardiology (Cardiology) Unice Bailey, MD (Internal Medicine) Gala Romney Cristopher Estimable, MD as Consulting Physician (Gastroenterology) Harl Bowie Alphonse Guild, MD as Consulting Physician (Cardiology)  Indicate any recent Medical Services you may have received from other than [facility] providers in the past year (date may be approximate).     Assessment:   This is a routine wellness examination for [place].  Hearing/Vision screen No exam data present  Dietary issues and exercise activities discussed:    Goals    . Increase physical activity    . LIFESTYLE - DECREASE FALLS RISK      Depression Screen PHQ 2/9 Scores 01/16/2020 01/10/2020 12/20/2019 11/14/2019 10/02/2019 02/13/2019 01/22/2019  PHQ - 2 Score 0 0 0 0 0 0 0  PHQ- 9 Score - - - - - - -    Fall Risk Fall Risk  01/16/2020 01/10/2020 12/20/2019 11/14/2019 10/02/2019  Falls in the past year? 0 0 Exclusion - non ambulatory 1 1  Number falls in past yr: 0 0 - 1 1  Injury with Fall? 0 0 - 0 0  Risk for fall due to : Impaired mobility No Fall Risks - - -  Follow up Falls evaluation completed Falls evaluation completed - - -    Any stairs in or around the home? No  If so, are there any without handrails? No  Home free of loose throw rugs in walkways, pet beds, electrical cords, etc? Yes  Adequate lighting in your  home to reduce risk of falls? Yes   ASSISTIVE DEVICES UTILIZED TO PREVENT FALLS:  Life alert? Yes  Use of a cane, walker or w/c? Yes  Grab bars in the bathroom? No  Shower chair or bench in shower? No  Elevated  toilet seat or a handicapped toilet? No   TIMED UP AND GO:  Was the test performed? No .   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   Cognitive Function:     6CIT Screen 03/21/2017  What Year? 0 points  What month? 0 points  What time? 0 points  Count back from 20 0 points  Months in reverse 0 points  Repeat phrase 0 points  Total Score 0    Immunizations Immunization History  Administered Date(s) Administered  . Influenza Whole 01/01/2010, 03/09/2010, 01/11/2011  . Influenza,inj,Quad PF,6+ Mos 02/09/2013, 05/09/2014, 05/07/2016, 01/31/2017, 01/25/2018, 01/22/2019, 12/20/2019  . Janssen (J&J) SARS-COV-2 Vaccination 07/02/2019  . Pneumococcal Conjugate-13 11/28/2013  . Pneumococcal Polysaccharide-23 01/22/2019  . Tdap 01/11/2011    TDAP status: Up to date Flu Vaccine status: Up to date Pneumococcal vaccine status: Up to date Covid-19 vaccine status: Completed vaccines  Qualifies for Shingles Vaccine? Yes   Zostavax completed No   Shingrix Completed?: No.    Education has been provided regarding the importance of this vaccine. Patient has been advised to call insurance company to determine out of pocket expense if they have not yet received this vaccine. Advised may also receive vaccine at local pharmacy or Health Dept. Verbalized acceptance and understanding.  Screening Tests Health Maintenance  Topic Date Due  . MAMMOGRAM  02/05/2017  . TETANUS/TDAP  01/10/2021  . PAP SMEAR-Modifier  11/14/2022  . COLONOSCOPY  01/13/2028  . INFLUENZA VACCINE  Completed  . COVID-19 Vaccine  Completed  . Hepatitis C Screening  Completed  . HIV Screening  Completed    Health Maintenance  Health Maintenance Due  Topic Date Due  . MAMMOGRAM  02/05/2017    Colorectal cancer screening: Completed normal. Repeat every 10 years   Mammogram status: Ordered not performed due to not being able to stand long enough. Pt provided with contact info and advised to call to schedule appt.    Bone Density Scan: Complete   Lung Cancer Screening: (Low Dose CT Chest recommended if Age 65-80 years, 30 pack-year currently smoking OR have quit w/in 15years.) does qualify.    Additional Screening:  Hepatitis C Screening: does qualify; Completed   Vision Screening: Recommended annual ophthalmology exams for early detection of glaucoma and other disorders of the eye.  Is the patient up to date with their annual eye exam?  No    Who is the provider or what is the name of the office in which the patient attends annual eye exams? Pt has been referred recently, was unable to make appt due to R foot surgery and blockages in R foot due to stroke.   If pt is not established with a provider, would they like to be referred to a provider to establish care? No .   Dental Screening: Recommended annual dental exams for proper oral hygiene  Community Resource Referral / Chronic Care Management: CRR required this visit?  No   CCM required this visit?  No      Plan:     I have personally reviewed and noted the following in the patient's chart:   . Medical and social history . Use of alcohol, tobacco or illicit drugs  . Current medications  and supplements . Functional ability and status . Nutritional status . Physical activity . Advanced directives . List of other physicians . Hospitalizations, surgeries, and ER visits in previous 12 months . Vitals . Screenings to include cognitive, depression, and falls . Referrals and appointments  In addition, I have reviewed and discussed with patient certain preventive protocols, quality metrics, and best practice recommendations. A written personalized care plan for preventive services as well as general preventive health recommendations were provided to patient.     Lonn Georgia, LPN   [id number]   Nurse Notes: AWV performed with provider in the office and pt along with POA (husband) in the home. Consent was given by the guardian  on behalf of the pt for televisit via audio. POA was asked questions as pt is unable to verbalize at this time due to stroke. Phone call took approx. 29min

## 2020-02-19 DIAGNOSIS — I110 Hypertensive heart disease with heart failure: Secondary | ICD-10-CM | POA: Diagnosis not present

## 2020-02-19 DIAGNOSIS — M069 Rheumatoid arthritis, unspecified: Secondary | ICD-10-CM | POA: Diagnosis not present

## 2020-02-19 DIAGNOSIS — I739 Peripheral vascular disease, unspecified: Secondary | ICD-10-CM | POA: Diagnosis not present

## 2020-02-19 DIAGNOSIS — I7025 Atherosclerosis of native arteries of other extremities with ulceration: Secondary | ICD-10-CM | POA: Diagnosis not present

## 2020-02-19 DIAGNOSIS — L97209 Non-pressure chronic ulcer of unspecified calf with unspecified severity: Secondary | ICD-10-CM | POA: Diagnosis not present

## 2020-02-19 DIAGNOSIS — L97919 Non-pressure chronic ulcer of unspecified part of right lower leg with unspecified severity: Secondary | ICD-10-CM | POA: Diagnosis not present

## 2020-02-19 DIAGNOSIS — E785 Hyperlipidemia, unspecified: Secondary | ICD-10-CM | POA: Diagnosis not present

## 2020-02-19 DIAGNOSIS — I509 Heart failure, unspecified: Secondary | ICD-10-CM | POA: Diagnosis not present

## 2020-03-05 ENCOUNTER — Other Ambulatory Visit: Payer: Self-pay | Admitting: Family Medicine

## 2020-03-05 DIAGNOSIS — Z1231 Encounter for screening mammogram for malignant neoplasm of breast: Secondary | ICD-10-CM

## 2020-03-05 DIAGNOSIS — E785 Hyperlipidemia, unspecified: Secondary | ICD-10-CM

## 2020-03-05 DIAGNOSIS — I10 Essential (primary) hypertension: Secondary | ICD-10-CM

## 2020-03-07 ENCOUNTER — Encounter: Payer: Medicare Other | Admitting: Vascular Surgery

## 2020-03-07 ENCOUNTER — Encounter (HOSPITAL_COMMUNITY): Payer: Medicare Other

## 2020-03-11 ENCOUNTER — Other Ambulatory Visit: Payer: Self-pay

## 2020-03-11 DIAGNOSIS — L97209 Non-pressure chronic ulcer of unspecified calf with unspecified severity: Secondary | ICD-10-CM | POA: Diagnosis not present

## 2020-03-11 DIAGNOSIS — M069 Rheumatoid arthritis, unspecified: Secondary | ICD-10-CM | POA: Diagnosis not present

## 2020-03-11 DIAGNOSIS — I739 Peripheral vascular disease, unspecified: Secondary | ICD-10-CM | POA: Diagnosis not present

## 2020-03-11 DIAGNOSIS — E785 Hyperlipidemia, unspecified: Secondary | ICD-10-CM | POA: Diagnosis not present

## 2020-03-11 DIAGNOSIS — I7025 Atherosclerosis of native arteries of other extremities with ulceration: Secondary | ICD-10-CM | POA: Diagnosis not present

## 2020-03-11 DIAGNOSIS — I509 Heart failure, unspecified: Secondary | ICD-10-CM | POA: Diagnosis not present

## 2020-03-11 DIAGNOSIS — I110 Hypertensive heart disease with heart failure: Secondary | ICD-10-CM | POA: Diagnosis not present

## 2020-03-11 DIAGNOSIS — L97919 Non-pressure chronic ulcer of unspecified part of right lower leg with unspecified severity: Secondary | ICD-10-CM | POA: Diagnosis not present

## 2020-03-17 ENCOUNTER — Other Ambulatory Visit: Payer: Self-pay | Admitting: Family Medicine

## 2020-03-25 DIAGNOSIS — I739 Peripheral vascular disease, unspecified: Secondary | ICD-10-CM | POA: Diagnosis not present

## 2020-03-25 DIAGNOSIS — M810 Age-related osteoporosis without current pathological fracture: Secondary | ICD-10-CM | POA: Diagnosis not present

## 2020-03-25 DIAGNOSIS — M25579 Pain in unspecified ankle and joints of unspecified foot: Secondary | ICD-10-CM | POA: Diagnosis not present

## 2020-03-25 DIAGNOSIS — K760 Fatty (change of) liver, not elsewhere classified: Secondary | ICD-10-CM | POA: Diagnosis not present

## 2020-03-25 DIAGNOSIS — M0579 Rheumatoid arthritis with rheumatoid factor of multiple sites without organ or systems involvement: Secondary | ICD-10-CM | POA: Diagnosis not present

## 2020-03-25 DIAGNOSIS — L97519 Non-pressure chronic ulcer of other part of right foot with unspecified severity: Secondary | ICD-10-CM | POA: Diagnosis not present

## 2020-03-25 DIAGNOSIS — R748 Abnormal levels of other serum enzymes: Secondary | ICD-10-CM | POA: Diagnosis not present

## 2020-03-25 DIAGNOSIS — M7989 Other specified soft tissue disorders: Secondary | ICD-10-CM | POA: Diagnosis not present

## 2020-03-25 DIAGNOSIS — M2550 Pain in unspecified joint: Secondary | ICD-10-CM | POA: Diagnosis not present

## 2020-03-25 DIAGNOSIS — Z79899 Other long term (current) drug therapy: Secondary | ICD-10-CM | POA: Diagnosis not present

## 2020-03-25 DIAGNOSIS — M79671 Pain in right foot: Secondary | ICD-10-CM | POA: Diagnosis not present

## 2020-03-25 DIAGNOSIS — G8929 Other chronic pain: Secondary | ICD-10-CM | POA: Diagnosis not present

## 2020-03-27 ENCOUNTER — Ambulatory Visit: Payer: Medicare Other | Admitting: Family Medicine

## 2020-03-28 ENCOUNTER — Encounter (HOSPITAL_COMMUNITY): Payer: Self-pay | Admitting: *Deleted

## 2020-03-28 ENCOUNTER — Emergency Department (HOSPITAL_COMMUNITY)
Admission: EM | Admit: 2020-03-28 | Discharge: 2020-03-28 | Disposition: A | Discharge status: Home / Self Care | Payer: Medicare Other | Attending: Emergency Medicine | Admitting: Emergency Medicine

## 2020-03-28 ENCOUNTER — Emergency Department (HOSPITAL_COMMUNITY): Payer: Medicare Other

## 2020-03-28 ENCOUNTER — Ambulatory Visit (INDEPENDENT_AMBULATORY_CARE_PROVIDER_SITE_OTHER): Payer: Medicare Other | Admitting: Internal Medicine

## 2020-03-28 ENCOUNTER — Other Ambulatory Visit: Payer: Self-pay

## 2020-03-28 ENCOUNTER — Encounter: Payer: Self-pay | Admitting: Internal Medicine

## 2020-03-28 DIAGNOSIS — G8929 Other chronic pain: Secondary | ICD-10-CM | POA: Insufficient documentation

## 2020-03-28 DIAGNOSIS — Z79891 Long term (current) use of opiate analgesic: Secondary | ICD-10-CM | POA: Insufficient documentation

## 2020-03-28 DIAGNOSIS — M810 Age-related osteoporosis without current pathological fracture: Secondary | ICD-10-CM

## 2020-03-28 DIAGNOSIS — M7989 Other specified soft tissue disorders: Secondary | ICD-10-CM | POA: Diagnosis not present

## 2020-03-28 DIAGNOSIS — Z5321 Procedure and treatment not carried out due to patient leaving prior to being seen by health care provider: Secondary | ICD-10-CM | POA: Diagnosis not present

## 2020-03-28 DIAGNOSIS — I739 Peripheral vascular disease, unspecified: Secondary | ICD-10-CM

## 2020-03-28 DIAGNOSIS — X58XXXA Exposure to other specified factors, initial encounter: Secondary | ICD-10-CM | POA: Insufficient documentation

## 2020-03-28 DIAGNOSIS — L97519 Non-pressure chronic ulcer of other part of right foot with unspecified severity: Secondary | ICD-10-CM | POA: Diagnosis not present

## 2020-03-28 DIAGNOSIS — K760 Fatty (change of) liver, not elsewhere classified: Secondary | ICD-10-CM | POA: Insufficient documentation

## 2020-03-28 DIAGNOSIS — S91301A Unspecified open wound, right foot, initial encounter: Secondary | ICD-10-CM | POA: Diagnosis not present

## 2020-03-28 DIAGNOSIS — Z981 Arthrodesis status: Secondary | ICD-10-CM | POA: Diagnosis not present

## 2020-03-28 DIAGNOSIS — M19071 Primary osteoarthritis, right ankle and foot: Secondary | ICD-10-CM | POA: Diagnosis not present

## 2020-03-28 HISTORY — DX: Age-related osteoporosis without current pathological fracture: M81.0

## 2020-03-28 NOTE — Progress Notes (Signed)
Established Patient Office Visit  Subjective:  Patient ID: Danielle Cruz, female    DOB: 06/26/1955  Age: 64 y.o. MRN: [id number]  CC:  Chief Complaint  Patient presents with  . Foot Swelling    Pt has foot swelling on the right side she saw reginald cathy wound specialist in [place] on 11-2 and returned on 11-16 was sent to vein specialist because they said it wasn't healing they sent her to vein specialist and the vein specialist did procedure but it isnt no better     HPI Danielle Cruz is a 64 year old female with PMH of PVD and non-healing right foot ulcer with multiple episodes of infections who presents with c/o foul-smelling discharge from the wound and worsening right leg edema extending to the thigh.  She has had leg ulcer for the last 5 months, and has had multiple course of antibiotics. She was seen by General surgeon for wound care and has stents placed by Vascular surgeon in 01/2020. Her daughter has been doing the dressing change regularly. She has noticed foul-smelling discharge for last 3-4 days. Her right leg edema has been getting worse, now extending to the right thigh. She is wheelchair bound currently. She has had severe pain in the right foot, which is getting worse for last few days. She tried to reach her Vascular surgeon, but they were not able to see her sooner.  Past Medical History:  Diagnosis Date  . AKI (acute kidney injury) ([place]) 09/22/2017  . Anemia   . 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 Arthritis   . CHF (congestive heart failure) ([place]) 10/02/2019  . Colonic polyp 2010   Hemorrhoids; h/o mild hematochezia  . CVA (cerebral infarction) 08/2008   sizable left -residual expressive aphasia, right sided weakness; ambulates with difficulty with the right leg brace   . Hyperlipidemia   . Hypertension 2005  . Osteoporosis 03/28/2020  . Rheumatoid arthritis(714.0)   . Stroke [facility])     Past  Surgical History:  Procedure Laterality Date  . ABDOMINAL AORTOGRAM W/LOWER EXTREMITY Bilateral 01/28/2020   Procedure: ABDOMINAL AORTOGRAM W/LOWER EXTREMITY;  Surgeon: Waynetta Sandy, MD;  Location: [place] [facility];  Service: Cardiovascular;  Laterality: Bilateral;  . ANKLE SURGERY     Right  . CAROTID STENT INSERTION    . COLONOSCOPY W/ POLYPECTOMY  11/2008   Dr. Gala Romney. Left-sided diverticula, Pedunculated polyp snared (no adenomatous changes)  . COLONOSCOPY WITH PROPOFOL N/A 01/12/2018   Procedure: COLONOSCOPY WITH PROPOFOL;  Surgeon: Daneil Dolin, MD;  [facility];  Service: Endoscopy;  Laterality: N/A;  . FEMUR IM NAIL Right 09/23/2017   Procedure: INTRAMEDULLARY (IM) NAIL FEMORAL;  Surgeon: Renette Butters, MD;  Location: [place];  Service: Orthopedics;  Laterality: Right;  . FOOT SURGERY Right 01/31/2020  . FRACTURE SURGERY     Hip replacement in May 2019 - fall related   . OOPHORECTOMY    . PERIPHERAL VASCULAR BALLOON ANGIOPLASTY Right 01/28/2020   Procedure: PERIPHERAL VASCULAR BALLOON ANGIOPLASTY;  Surgeon: Waynetta Sandy, MD;  Location: [place] [facility];  Service: Cardiovascular;  Laterality: Right;  SFA    Family History  Problem Relation Age of Onset  . Cancer Father        prostate, deceased 39s  . Breast cancer Sister        Deceased 4s  . Heart attack Mother  deceased 53, during childbirth  . Cancer Brother        lymphoma  . Colon cancer Maternal Aunt        older than age 40  . Liver disease Neg Hx     Social History   Socioeconomic History  . Marital status: Married    Spouse name: Not on file  . Number of children: 3  . Years of education: 11th grade  . Highest education level: 11th grade  Occupational History  . Occupation: disabilty x 9years    Comment: secondary to arthritis   Tobacco Use  . Smoking status: Current Every Day Smoker    Packs/day: 0.50    Years: 15.00    Pack years: 7.50    Types:  Cigarettes  . Smokeless tobacco: Never Used  . Tobacco comment: quit after hospitized for hip fracture   Vaping Use  . Vaping Use: Never used  Substance and Sexual Activity  . Alcohol use: No    Alcohol/week: 0.0 standard drinks    Comment: quit 6 years ago   . Drug use: No  . Sexual activity: Not on file  Other Topics Concern  . Not on file  Social History Narrative   Pt gave birth to 4 children, 1 deceased at 81 weeks was a premature baby, 3 are living ages range 68 to 74 all healthy    Social Determinants of Health   Financial Resource Strain: Medium Risk  . Difficulty of Paying Living Expenses: Somewhat hard  Food Insecurity: No Food Insecurity  . Worried About Charity fundraiser in the Last Year: Never true  . Ran Out of Food in the Last Year: Never true  Transportation Needs: No Transportation Needs  . Lack of Transportation (Medical): No  . Lack of Transportation (Non-Medical): No  Physical Activity: Inactive  . Days of Exercise per Week: 0 days  . Minutes of Exercise per Session: 0 min  Stress: No Stress Concern Present  . Feeling of Stress : Only a little  Social Connections: Moderately Integrated  . Frequency of Communication with Friends and Family: Never  . Frequency of Social Gatherings with Friends and Family: Three times a week  . Attends Religious Services: More than 4 times per year  . Active Member of Clubs or Organizations: No  . Attends Archivist Meetings: Never  . Marital Status: Married  Human resources officer Violence: Not At Risk  . Fear of Current or Ex-Partner: No  . Emotionally Abused: No  . Physically Abused: No  . Sexually Abused: No    Outpatient Medications Prior to Visit  Medication Sig Dispense Refill  . acetaminophen (TYLENOL) 325 MG tablet Take 650 mg by mouth every 6 (six) hours as needed for mild pain or moderate pain.    Marland Kitchen alendronate (FOSAMAX) 70 MG tablet 1 tablet 30 minutes before the first food, beverage or medicine of the  day with plain water    . amLODipine (NORVASC) 5 MG tablet TAKE 1 TABLET(5 MG) BY MOUTH DAILY 30 tablet 3  . aspirin EC 81 MG tablet Take 81 mg by mouth daily.    . clindamycin (CLEOCIN) 300 MG capsule Take one capsule by mouth three times daily for 10 days (Patient taking differently: Take 300 mg by mouth 3 (three) times daily.) 30 capsule 0  . clopidogrel (PLAVIX) 75 MG tablet Take 1 tablet (75 mg total) by mouth daily. 30 tablet 11  . ezetimibe (ZETIA) 10 MG tablet TAKE 1 TABLET(10  MG) BY MOUTH DAILY (Patient taking differently: Take 10 mg by mouth daily.) 90 tablet 3  . fluticasone (FLONASE) 50 MCG/ACT nasal spray SHAKE LIQUID AND USE 1 SPRAY IN EACH NOSTRIL DAILY (Patient taking differently: Place 1 spray into both nostrils daily as needed for allergies.) 16 g 3  . HYDROcodone-acetaminophen (NORCO/VICODIN) 5-325 MG tablet Take 1 tablet by mouth daily as needed for moderate pain.   0  . hydroxychloroquine (PLAQUENIL) 200 MG tablet Take 400 mg by mouth daily.    . metoprolol succinate (TOPROL-XL) 25 MG 24 hr tablet TAKE 1 TABLET(25 MG) BY MOUTH DAILY (Patient taking differently: Take 25 mg by mouth daily.) 30 tablet 11  . montelukast (SINGULAIR) 10 MG tablet TAKE 1 TABLET(10 MG) BY MOUTH AT BEDTIME 30 tablet 4  . Multiple Vitamin (MULTIVITAMIN WITH MINERALS) TABS tablet Take 1 tablet by mouth daily.    . nicotine (NICODERM CQ - DOSED IN MG/24 HOURS) 14 mg/24hr patch Place 1 patch (14 mg total) onto the skin daily. 28 patch 1  . oxybutynin (DITROPAN) 5 MG tablet TAKE 1/2 TABLET(2.5 MG) BY MOUTH TWICE DAILY (Patient taking differently: Take 2.5 mg by mouth 2 (two) times daily.) 90 tablet 3  . pantoprazole (PROTONIX) 40 MG tablet TAKE 1 TABLET(40 MG) BY MOUTH DAILY (Patient taking differently: Take 40 mg by mouth daily.) 30 tablet 5  . potassium chloride SA (KLOR-CON) 20 MEQ tablet Take 20 mEq by mouth daily.    . rosuvastatin (CRESTOR) 20 MG tablet Take one tablet by mouth every Monday, Wednesday,  Friday and Sunday (Patient taking differently: Take 20 mg by mouth See admin instructions. Take 1 tablet (20mg ) by mouth every Monday, Wednesday, Friday and Sunday) 48 tablet 6  . UNABLE TO FIND Under pads use as needed  Pullups use as needed   Length of need- 99 months DX urinary incontinence 100 each 11  . UNABLE TO FIND Incontinence briefs and pads Dx incontinence 100 each 11  . torsemide (DEMADEX) 20 MG tablet Take 0.5 tablets (10 mg total) by mouth daily. 45 tablet 3   No facility-administered medications prior to visit.    Allergies  Allergen Reactions  . Ace Inhibitors Cough    New daily cough since starting ACE    ROS Review of Systems  Constitutional: Negative for chills and fever.  HENT: Negative for congestion, sinus pressure, sinus pain and sore throat.   Eyes: Negative for pain and discharge.  Respiratory: Negative for cough and shortness of breath.   Cardiovascular: Negative for chest pain and palpitations.  Gastrointestinal: Negative for abdominal pain, constipation, diarrhea, nausea and vomiting.  Endocrine: Negative for polydipsia and polyuria.  Genitourinary: Negative for dysuria and hematuria.  Musculoskeletal: Negative for neck pain and neck stiffness.  Skin: Positive for wound (Right foot, with foul-smelling discharge).  Neurological: Negative for dizziness and weakness.  Psychiatric/Behavioral: Negative for agitation and behavioral problems.      Objective:    Physical Exam Constitutional:      Appearance: She is obese.     Comments: In wheelchair  HENT:     Head: Normocephalic.     Nose: Nose normal.     Mouth/Throat:     Mouth: Mucous membranes are moist.  Eyes:     General: No scleral icterus.    Extraocular Movements: Extraocular movements intact.     Pupils: Pupils are equal, round, and reactive to light.  Cardiovascular:     Rate and Rhythm: Normal rate and regular rhythm.  Heart sounds: No murmur heard.   Pulmonary:     Breath  sounds: Normal breath sounds. No wheezing or rales.  Musculoskeletal:     Cervical back: Neck supple.     Right lower leg: Edema (Extending to the thigh) present.     Comments: Right foot wrapped in bandage, foul-smelling discharge  Neurological:     General: No focal deficit present.     Mental Status: She is alert and oriented to person, place, and time.  Psychiatric:        Mood and Affect: Mood normal.        Behavior: Behavior normal.     BP 135/71 (BP Location: Right Arm, Patient Position: Sitting, Cuff Size: Normal)   Pulse 75   Temp 97.6 F (36.4 C) (Temporal)   Resp 16   SpO2 99%  Wt Readings from Last 3 Encounters:  01/28/20 185 lb (83.9 kg)  01/16/20 184 lb (83.5 kg)  01/10/20 180 lb 6.4 oz (81.8 kg)     Health Maintenance Due  Topic Date Due  . MAMMOGRAM  02/05/2017  . COVID-19 Vaccine (2 - Booster for Janssen series) 08/27/2019    There are no preventive care reminders to display for this patient.  Lab Results  Component Value Date   TSH 1.28 01/10/2020   Lab Results  Component Value Date   WBC 8.2 12/22/2019   HGB 11.6 (L) 01/28/2020   HCT 34.0 (L) 01/28/2020   MCV 101.9 (H) 12/22/2019   PLT 177 12/22/2019   Lab Results  Component Value Date   NA 143 01/28/2020   K 5.0 01/28/2020   CO2 24 01/10/2020   GLUCOSE 84 01/28/2020   BUN 19 01/28/2020   CREATININE 1.00 01/28/2020   BILITOT 0.4 01/10/2020   ALKPHOS 77 10/01/2019   AST 59 (H) 01/10/2020   ALT 68 (H) 01/10/2020   PROT 7.1 01/10/2020   ALBUMIN 3.3 (L) 10/01/2019   CALCIUM 9.2 01/10/2020   ANIONGAP 9 12/22/2019   Lab Results  Component Value Date   CHOL 100 01/10/2020   Lab Results  Component Value Date   HDL 24 (L) 01/10/2020   Lab Results  Component Value Date   LDLCALC 49 01/10/2020   Lab Results  Component Value Date   TRIG 195 (H) 01/10/2020   Lab Results  Component Value Date   CHOLHDL 4.2 01/10/2020   Lab Results  Component Value Date   HGBA1C 6.1 (H)  12/22/2019      Assessment & Plan:   Problem List Items Addressed This Visit      Cardiovascular and Mediastinum   Peripheral vascular disease ([place])    Leading to nonhealing right foot ulcer On Aspirin and Plavix S/p stent placement in 01/2020 Follows up with Vascular surgeon         Other   Right foot ulcer ([place])    Has had recurrent ulcers, has been followed up by General surgeon for wound care Wound has been draining foul-smelling pus lately, has had oral antibiotics in the past Right leg edema extending to the thigh Referred to ER for urgent evaluation by General surgeon, culture and possible need for IV antibiotics based on the culture        Referred to ER and discussed with ER Physician the reason for referral.   No orders of the defined types were placed in this encounter.   Follow-up: No follow-ups on file.    Lindell Spar, MD

## 2020-03-28 NOTE — ED Triage Notes (Signed)
Left at 1730

## 2020-03-28 NOTE — Patient Instructions (Signed)
Please go to ER at [facility] for evaluation of right foot wound.

## 2020-03-28 NOTE — ED Triage Notes (Signed)
Right foot wound for over 2 months

## 2020-03-28 NOTE — Assessment & Plan Note (Signed)
Has had recurrent ulcers, has been followed up by General surgeon for wound care Wound has been draining foul-smelling pus lately, has had oral antibiotics in the past Right leg edema extending to the thigh Referred to ER for urgent evaluation by General surgeon, culture and possible need for IV antibiotics based on the culture

## 2020-03-28 NOTE — Assessment & Plan Note (Signed)
Leading to nonhealing right foot ulcer On Aspirin and Plavix S/p stent placement in 01/2020 Follows up with Vascular surgeon

## 2020-03-31 ENCOUNTER — Other Ambulatory Visit: Payer: Self-pay

## 2020-03-31 ENCOUNTER — Ambulatory Visit (INDEPENDENT_AMBULATORY_CARE_PROVIDER_SITE_OTHER): Payer: Medicare Other | Admitting: Nurse Practitioner

## 2020-03-31 ENCOUNTER — Encounter: Payer: Self-pay | Admitting: Nurse Practitioner

## 2020-03-31 VITALS — BP 138/82 | HR 90 | Temp 98.5°F | Resp 20 | Ht 67.0 in | Wt 165.0 lb

## 2020-03-31 DIAGNOSIS — L97519 Non-pressure chronic ulcer of other part of right foot with unspecified severity: Secondary | ICD-10-CM

## 2020-03-31 MED ORDER — SULFAMETHOXAZOLE-TRIMETHOPRIM 800-160 MG PO TABS: 1.0000 | ORAL_TABLET | Freq: Two times a day (BID) | ORAL | 0 refills | Status: DC

## 2020-03-31 NOTE — Progress Notes (Addendum)
Established Patient Office Visit  Subjective:  Patient ID: Danielle Cruz, female    DOB: May 12, 1955  Age: 64 y.o. MRN: [id number]  CC:  Chief Complaint  Patient presents with  . Foot Swelling  . Foot Pain    HPI Danielle Cruz presents for follow-up for right foot ulcer.   She has had leg ulcer for the last 5 months, and has had multiple course of antibiotics. She is followed by [name] with a Wound Care clinic in [place], and she was referred to vascular surgery. Additionally, she was seen by General surgeon for wound care and had stents placed by Vascular surgeon in 01/2020. Her daughter has been doing the dressing change regularly. She has noticed foul-smelling discharge for last several days. Her right leg edema has been getting worse, now extending to the right thigh. She is wheelchair bound currently. She has had severe pain in the right foot, which has been getting worse for the last few days. She tried to reach her Vascular surgeon, but they were not able to see her sooner.   She saw Dr. Posey Pronto in-office on 12/10 and was sent to the ED for rapid imaging and treatment for her ulcer. In the ED, the patient was combative based on radiology reports, and a discharge note was not written because the patient left AMA.  Dr. Sheron Nightingale note from 11/23 states "Patient was a no show no call for VVS follow up appt for Nov 19. Called patient daughter and explained that they needed to call and explain situation and could get an appt faster per VVS reception, otherwise, this RN has to fax a note and referral and wait. Daughter is calling now and was unaware of mother's appt and that she had missed it. Signed by [name], RN".      Past Medical History:  Diagnosis Date  . AKI (acute kidney injury) ([place]) 09/22/2017  . Anemia   . 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 Arthritis   . CHF (congestive heart failure) ([place])  10/02/2019  . Colonic polyp 2010   Hemorrhoids; h/o mild hematochezia  . CVA (cerebral infarction) 08/2008   sizable left -residual expressive aphasia, right sided weakness; ambulates with difficulty with the right leg brace   . Hyperlipidemia   . Hypertension 2005  . Osteoporosis 03/28/2020  . Rheumatoid arthritis(714.0)   . Stroke [facility])     Past Surgical History:  Procedure Laterality Date  . ABDOMINAL AORTOGRAM W/LOWER EXTREMITY Bilateral 01/28/2020   Procedure: ABDOMINAL AORTOGRAM W/LOWER EXTREMITY;  Surgeon: Waynetta Sandy, MD;  Location: [place] [facility];  Service: Cardiovascular;  Laterality: Bilateral;  . ANKLE SURGERY     Right  . CAROTID STENT INSERTION    . COLONOSCOPY W/ POLYPECTOMY  11/2008   Dr. Gala Romney. Left-sided diverticula, Pedunculated polyp snared (no adenomatous changes)  . COLONOSCOPY WITH PROPOFOL N/A 01/12/2018   Procedure: COLONOSCOPY WITH PROPOFOL;  Surgeon: Daneil Dolin, MD;  [facility];  Service: Endoscopy;  Laterality: N/A;  . FEMUR IM NAIL Right 09/23/2017   Procedure: INTRAMEDULLARY (IM) NAIL FEMORAL;  Surgeon: Renette Butters, MD;  Location: [place];  Service: Orthopedics;  Laterality: Right;  . FOOT SURGERY Right 01/31/2020  . FRACTURE SURGERY     Hip replacement in May 2019 - fall related   . OOPHORECTOMY    . PERIPHERAL VASCULAR BALLOON ANGIOPLASTY Right 01/28/2020  Procedure: PERIPHERAL VASCULAR BALLOON ANGIOPLASTY;  Surgeon: Waynetta Sandy, MD;  Location: [place] [facility];  Service: Cardiovascular;  Laterality: Right;  SFA    Family History  Problem Relation Age of Onset  . Cancer Father        prostate, deceased 71s  . Breast cancer Sister        Deceased 61s  . Heart attack Mother        deceased 63, during childbirth  . Cancer Brother        lymphoma  . Colon cancer Maternal Aunt        older than age 86  . Liver disease Neg Hx     Social History   Socioeconomic History  . Marital status:  Married    Spouse name: Not on file  . Number of children: 3  . Years of education: 11th grade  . Highest education level: 11th grade  Occupational History  . Occupation: disabilty x 9years    Comment: secondary to arthritis   Tobacco Use  . Smoking status: Current Every Day Smoker    Packs/day: 0.50    Years: 15.00    Pack years: 7.50    Types: Cigarettes  . Smokeless tobacco: Never Used  . Tobacco comment: quit after hospitized for hip fracture   Vaping Use  . Vaping Use: Never used  Substance and Sexual Activity  . Alcohol use: No    Alcohol/week: 0.0 standard drinks    Comment: quit 6 years ago   . Drug use: No  . Sexual activity: Not on file  Other Topics Concern  . Not on file  Social History Narrative   Pt gave birth to 4 children, 1 deceased at 49 weeks was a premature baby, 3 are living ages range 59 to 24 all healthy    Social Determinants of Health   Financial Resource Strain: Medium Risk  . Difficulty of Paying Living Expenses: Somewhat hard  Food Insecurity: No Food Insecurity  . Worried About Charity fundraiser in the Last Year: Never true  . Ran Out of Food in the Last Year: Never true  Transportation Needs: No Transportation Needs  . Lack of Transportation (Medical): No  . Lack of Transportation (Non-Medical): No  Physical Activity: Inactive  . Days of Exercise per Week: 0 days  . Minutes of Exercise per Session: 0 min  Stress: No Stress Concern Present  . Feeling of Stress : Only a little  Social Connections: Moderately Integrated  . Frequency of Communication with Friends and Family: Never  . Frequency of Social Gatherings with Friends and Family: Three times a week  . Attends Religious Services: More than 4 times per year  . Active Member of Clubs or Organizations: No  . Attends Archivist Meetings: Never  . Marital Status: Married  Human resources officer Violence: Not At Risk  . Fear of Current or Ex-Partner: No  . Emotionally Abused: No   . Physically Abused: No  . Sexually Abused: No    Outpatient Medications Prior to Visit  Medication Sig Dispense Refill  . acetaminophen (TYLENOL) 325 MG tablet Take 650 mg by mouth every 6 (six) hours as needed for mild pain or moderate pain.    Marland Kitchen alendronate (FOSAMAX) 70 MG tablet 1 tablet 30 minutes before the first food, beverage or medicine of the day with plain water    . amLODipine (NORVASC) 5 MG tablet TAKE 1 TABLET(5 MG) BY MOUTH DAILY 30 tablet 3  .  aspirin EC 81 MG tablet Take 81 mg by mouth daily.    . clindamycin (CLEOCIN) 300 MG capsule Take one capsule by mouth three times daily for 10 days (Patient taking differently: Take 300 mg by mouth 3 (three) times daily.) 30 capsule 0  . clopidogrel (PLAVIX) 75 MG tablet Take 1 tablet (75 mg total) by mouth daily. 30 tablet 11  . ezetimibe (ZETIA) 10 MG tablet TAKE 1 TABLET(10 MG) BY MOUTH DAILY (Patient taking differently: Take 10 mg by mouth daily.) 90 tablet 3  . fluticasone (FLONASE) 50 MCG/ACT nasal spray SHAKE LIQUID AND USE 1 SPRAY IN EACH NOSTRIL DAILY (Patient taking differently: Place 1 spray into both nostrils daily as needed for allergies.) 16 g 3  . HYDROcodone-acetaminophen (NORCO/VICODIN) 5-325 MG tablet Take 1 tablet by mouth daily as needed for moderate pain.   0  . hydroxychloroquine (PLAQUENIL) 200 MG tablet Take 400 mg by mouth daily.    . metoprolol succinate (TOPROL-XL) 25 MG 24 hr tablet TAKE 1 TABLET(25 MG) BY MOUTH DAILY (Patient taking differently: Take 25 mg by mouth daily.) 30 tablet 11  . montelukast (SINGULAIR) 10 MG tablet TAKE 1 TABLET(10 MG) BY MOUTH AT BEDTIME 30 tablet 4  . Multiple Vitamin (MULTIVITAMIN WITH MINERALS) TABS tablet Take 1 tablet by mouth daily.    . nicotine (NICODERM CQ - DOSED IN MG/24 HOURS) 14 mg/24hr patch Place 1 patch (14 mg total) onto the skin daily. 28 patch 1  . oxybutynin (DITROPAN) 5 MG tablet TAKE 1/2 TABLET(2.5 MG) BY MOUTH TWICE DAILY (Patient taking differently: Take 2.5  mg by mouth 2 (two) times daily.) 90 tablet 3  . pantoprazole (PROTONIX) 40 MG tablet TAKE 1 TABLET(40 MG) BY MOUTH DAILY (Patient taking differently: Take 40 mg by mouth daily.) 30 tablet 5  . potassium chloride SA (KLOR-CON) 20 MEQ tablet Take 20 mEq by mouth daily.    . rosuvastatin (CRESTOR) 20 MG tablet Take one tablet by mouth every Monday, Wednesday, Friday and Sunday (Patient taking differently: Take 20 mg by mouth See admin instructions. Take 1 tablet (20mg ) by mouth every Monday, Wednesday, Friday and Sunday) 48 tablet 6  . UNABLE TO FIND Under pads use as needed  Pullups use as needed   Length of need- 99 months DX urinary incontinence 100 each 11  . UNABLE TO FIND Incontinence briefs and pads Dx incontinence 100 each 11  . torsemide (DEMADEX) 20 MG tablet Take 0.5 tablets (10 mg total) by mouth daily. 45 tablet 3   No facility-administered medications prior to visit.    Allergies  Allergen Reactions  . Ace Inhibitors Cough    New daily cough since starting ACE    ROS Review of Systems  Constitutional: Negative.   Skin: Positive for wound.       Massive wounds to right foot, malodorous, draining yellow fluid      Objective:    Physical Exam Constitutional:      General: She is not in acute distress.    Appearance: She is ill-appearing.  Musculoskeletal:       Feet:  Skin:    Comments: -superior aspect of right foot has massive full thickness wound with purulent drainage -medial aspect of right lower leg also has massive full thickness wound with purulent drainage -dressing was visibly soiled, last dated 12/9 -malodorous  Neurological:     Mental Status: She is alert.     Comments: Hx of CVA, wheelchair bound, non-verbal, but can point and indicate  her needs     BP 138/82   Pulse 90   Temp 98.5 F (36.9 C)   Resp 20   Ht 5\' 7"  (1.702 m)   Wt 165 lb (74.8 kg)   SpO2 92%   BMI 25.84 kg/m  Wt Readings from Last 3 Encounters:  03/31/20 165 lb (74.8  kg)  03/28/20 186 lb (84.4 kg)  01/28/20 185 lb (83.9 kg)     Health Maintenance Due  Topic Date Due  . MAMMOGRAM  02/05/2017  . COVID-19 Vaccine (2 - Booster for Janssen series) 08/27/2019    There are no preventive care reminders to display for this patient.  Lab Results  Component Value Date   TSH 1.28 01/10/2020   Lab Results  Component Value Date   WBC 8.2 12/22/2019   HGB 11.6 (L) 01/28/2020   HCT 34.0 (L) 01/28/2020   MCV 101.9 (H) 12/22/2019   PLT 177 12/22/2019   Lab Results  Component Value Date   NA 143 01/28/2020   K 5.0 01/28/2020   CO2 24 01/10/2020   GLUCOSE 84 01/28/2020   BUN 19 01/28/2020   CREATININE 1.00 01/28/2020   BILITOT 0.4 01/10/2020   ALKPHOS 77 10/01/2019   AST 59 (H) 01/10/2020   ALT 68 (H) 01/10/2020   PROT 7.1 01/10/2020   ALBUMIN 3.3 (L) 10/01/2019   CALCIUM 9.2 01/10/2020   ANIONGAP 9 12/22/2019   Lab Results  Component Value Date   CHOL 100 01/10/2020   Lab Results  Component Value Date   HDL 24 (L) 01/10/2020   Lab Results  Component Value Date   LDLCALC 49 01/10/2020   Lab Results  Component Value Date   TRIG 195 (H) 01/10/2020   Lab Results  Component Value Date   CHOLHDL 4.2 01/10/2020   Lab Results  Component Value Date   HGBA1C 6.1 (H) 12/22/2019      Assessment & Plan:   Problem List Items Addressed This Visit      Other   Ulcer of right foot ([place]) - Primary    -non-healing ulcer to superior aspect for right foot as well as medial aspect of right lower leg; minimum of stage 3 ulceration d/t full thickness involvement; unable to assess all of wound bed d/t copious purulent drainage -she states she was dismissed from wound clinic after she was referred to vascular surgery -she went to ED last week, but eloped because it took a long time and she was uncomfortable -she states her swelling has gotten worse over the weekend and is progressing past her knees now -Rx. Bactrim, she had been on this  previously from Wound clinic -wound culture obtained -we discussed that PO Bactrim will not be enough to fix her issue, but she very likely will need amputation because antibiotics have failed -recommend her go to ED for eval, and likely will have admission with surgical consult and amputation -referred to vascular with [facility] since her VVS can't see her until January;         No orders of the defined types were placed in this encounter.   Follow-up: Return if symptoms worsen or fail to improve.   Time spent: 66 minutes  Noreene Larsson, NP

## 2020-03-31 NOTE — Patient Instructions (Signed)
-  non-healing ulcer to superior aspect for right foot as well as medial aspect of right lower leg; minimum of stage 3 ulceration d/t full thickness involvement; unable to assess all of wound bed d/t copious purulent drainage -she states she was dismissed from wound clinic after she was referred to vascular surgery -she went to ED last week, but eloped because it took a long time and she was uncomfortable -she states her swelling has gotten worse over the weekend and is progressing past her knees now -Rx. Bactrim, she had been on this previously form Wound clinic -wound culture obtained -we discussed that PO Bactrim will not be enough to fix her issue, but she very likely will need amputation because antibiotics have failed -recommend her go to ED for eval, and likely will have admission with surgical consult and amputation

## 2020-03-31 NOTE — Assessment & Plan Note (Addendum)
-  non-healing ulcer to superior aspect for right foot as well as medial aspect of right lower leg; minimum of stage 3 ulceration d/t full thickness involvement; unable to assess all of wound bed d/t copious purulent drainage -she states she was dismissed from wound clinic after she was referred to vascular surgery -she went to ED last week, but eloped because it took a long time and she was uncomfortable -she states her swelling has gotten worse over the weekend and is progressing past her knees now -dressing change completed today; irrigated some, but pt was in pain, so no debridement performed -Rx. Bactrim, she had been on this previously from Wound clinic -wound culture obtained -we discussed that PO Bactrim will not be enough to fix her issue, but she very likely will need amputation because antibiotics have failed -recommend her go to ED for eval, and likely will have admission with surgical consult and amputation -referred to vascular with [facility] since her VVS can't see her until January

## 2020-04-01 ENCOUNTER — Encounter (HOSPITAL_COMMUNITY): Payer: Self-pay | Admitting: Emergency Medicine

## 2020-04-01 ENCOUNTER — Emergency Department (HOSPITAL_COMMUNITY)
Admission: EM | Admit: 2020-04-01 | Discharge: 2020-04-01 | Disposition: A | Discharge status: Home / Self Care | Payer: Medicare Other | Attending: Emergency Medicine | Admitting: Emergency Medicine

## 2020-04-01 ENCOUNTER — Other Ambulatory Visit: Payer: Self-pay

## 2020-04-01 DIAGNOSIS — Z5321 Procedure and treatment not carried out due to patient leaving prior to being seen by health care provider: Secondary | ICD-10-CM | POA: Diagnosis not present

## 2020-04-01 DIAGNOSIS — S80821A Blister (nonthermal), right lower leg, initial encounter: Secondary | ICD-10-CM | POA: Insufficient documentation

## 2020-04-01 DIAGNOSIS — M79661 Pain in right lower leg: Secondary | ICD-10-CM | POA: Diagnosis present

## 2020-04-01 DIAGNOSIS — X58XXXA Exposure to other specified factors, initial encounter: Secondary | ICD-10-CM | POA: Diagnosis not present

## 2020-04-01 LAB — CBC WITH DIFFERENTIAL/PLATELET
Abs Immature Granulocytes: 0.03 10*3/uL (ref 0.00–0.07)
Basophils Absolute: 0 10*3/uL (ref 0.0–0.1)
Basophils Relative: 0 %
Eosinophils Absolute: 0.1 10*3/uL (ref 0.0–0.5)
Eosinophils Relative: 1 %
HCT: 33.9 % — ABNORMAL LOW (ref 36.0–46.0)
Hemoglobin: 11 g/dL — ABNORMAL LOW (ref 12.0–15.0)
Immature Granulocytes: 0 %
Lymphocytes Relative: 24 %
Lymphs Abs: 2 10*3/uL (ref 0.7–4.0)
MCH: 32.7 pg (ref 26.0–34.0)
MCHC: 32.4 g/dL (ref 30.0–36.0)
MCV: 100.9 fL — ABNORMAL HIGH (ref 80.0–100.0)
Monocytes Absolute: 1 10*3/uL (ref 0.1–1.0)
Monocytes Relative: 12 %
Neutro Abs: 5.1 10*3/uL (ref 1.7–7.7)
Neutrophils Relative %: 63 %
Platelets: 299 10*3/uL (ref 150–400)
RBC: 3.36 MIL/uL — ABNORMAL LOW (ref 3.87–5.11)
RDW: 18.6 % — ABNORMAL HIGH (ref 11.5–15.5)
WBC: 8.2 10*3/uL (ref 4.0–10.5)
nRBC: 0 % (ref 0.0–0.2)

## 2020-04-01 LAB — COMPREHENSIVE METABOLIC PANEL
ALT: 32 U/L (ref 0–44)
AST: 37 U/L (ref 15–41)
Albumin: 2.5 g/dL — ABNORMAL LOW (ref 3.5–5.0)
Alkaline Phosphatase: 133 U/L — ABNORMAL HIGH (ref 38–126)
Anion gap: 10 (ref 5–15)
BUN: 6 mg/dL — ABNORMAL LOW (ref 8–23)
CO2: 21 mmol/L — ABNORMAL LOW (ref 22–32)
Calcium: 8.7 mg/dL — ABNORMAL LOW (ref 8.9–10.3)
Chloride: 107 mmol/L (ref 98–111)
Creatinine, Ser: 0.85 mg/dL (ref 0.44–1.00)
GFR, Estimated: >60 mL/min (ref >60)
Glucose, Bld: 97 mg/dL (ref 70–99)
Potassium: 3.2 mmol/L — ABNORMAL LOW (ref 3.5–5.1)
Sodium: 138 mmol/L (ref 135–145)
Total Bilirubin: 0.5 mg/dL (ref 0.3–1.2)
Total Protein: 7.9 g/dL (ref 6.5–8.1)

## 2020-04-01 LAB — LACTIC ACID, PLASMA: Lactic Acid, Venous: 1.8 mmol/L (ref 0.5–1.9)

## 2020-04-01 NOTE — ED Triage Notes (Signed)
Pt to ED with c/o pain and wounds to right lower leg.  Pt has seen a vascular MD and today was referred to the ED by her medical MD for IV antibiotics for sores on right lower leg.  Pt c/o pain to this area

## 2020-04-01 NOTE — ED Notes (Signed)
Pt called x2 for VS, no response

## 2020-04-02 ENCOUNTER — Telehealth: Payer: Self-pay

## 2020-04-02 ENCOUNTER — Other Ambulatory Visit: Payer: Self-pay

## 2020-04-02 DIAGNOSIS — I7025 Atherosclerosis of native arteries of other extremities with ulceration: Secondary | ICD-10-CM

## 2020-04-02 DIAGNOSIS — I739 Peripheral vascular disease, unspecified: Secondary | ICD-10-CM

## 2020-04-02 NOTE — Telephone Encounter (Signed)
Patient's family called, patient has malodorous open draining wound on feet. According to PCP notes, patient needs to be seen in hospital. Patient is nonverbal due to past stroke and has been to ED twice, leaving both occasions due to wait times. Explained that wait times were unfortunately everywhere and that it was likely no matter what we did the wait was possibly inevitable. Discussed with PA if there was anything we could do to expedite, we can put patient on office schedule tomorrow to evaluate. Called family back to suggest an OV tomorrow - left VM.

## 2020-04-03 ENCOUNTER — Encounter: Payer: Self-pay | Admitting: Orthopedic Surgery

## 2020-04-03 ENCOUNTER — Ambulatory Visit (INDEPENDENT_AMBULATORY_CARE_PROVIDER_SITE_OTHER): Payer: Medicare Other | Admitting: Orthopedic Surgery

## 2020-04-03 VITALS — Ht 67.0 in | Wt 165.0 lb

## 2020-04-03 DIAGNOSIS — I2510 Atherosclerotic heart disease of native coronary artery without angina pectoris: Secondary | ICD-10-CM

## 2020-04-03 DIAGNOSIS — I96 Gangrene, not elsewhere classified: Secondary | ICD-10-CM

## 2020-04-03 DIAGNOSIS — I872 Venous insufficiency (chronic) (peripheral): Secondary | ICD-10-CM

## 2020-04-03 NOTE — Progress Notes (Signed)
Office Visit Note   Patient: Danielle Cruz           Date of Birth: [birth year]           MRN: [id number] Visit Date: 04/03/2020              Requested by: Noreene Larsson, NP [address]  [address] [place],  [place] [id number] PCP: Fayrene Helper, MD  Chief Complaint  Patient presents with  . Right Leg - Pain      HPI: Patient is a 64 year old woman who was seen for initial evaluation for ischemic necrotic ulceration to the posterior right calf and dorsum of the right foot.  Patient is status post revascularization with Dr. Donzetta Matters.  She has single-vessel runoff to the anterior tibial.  Patient has been going to wound care in [facility] was using enzymatic debridement and now is using dry dressing changes she has not used any compression.  She has a follow-up with vascular vein surgery on January 7.  Assessment & Plan: Visit Diagnoses:  1. Gangrene of right foot (HCC)   2. Venous insufficiency (chronic) (peripheral)     Plan: Will start with serial compression to help improve her microcirculation.  We will apply silver alginate to the wounds Unna paste and a Dynaflex compression wrap follow-up Monday and Thursday next week.  Will eventually place her in a pair of my compression socks.  She is currently on clindamycin Bactrim DS and Plavix.  Follow-Up Instructions: Return in about 1 week (around 04/10/2020) for Plan to follow-up biweekly.  Follow-up this Monday and Thursday.Manson Passey Exam  Patient is alert, oriented, no adenopathy, well-dressed, normal affect, normal respiratory effort. Examination patient has massive swelling of the right leg with brawny skin color changes from chronic venous insufficiency.  Her calf is 47 cm in circumference.  She has a massive necrotic ulcer over the posterior medial aspect of the right calf as well has a massive necrotic ulcer of the dorsum of the right foot however there is some good new petechial granulation tissue approximately 25% of  the wound.  The Doppler was used I cannot Doppler a posterior tibial pulse but she has a strong biphasic dorsalis pedis pulse with  excellent circulation from her revascularization.  Imaging: No results found. No images are attached to the encounter.  Labs: Lab Results  Component Value Date   HGBA1C 6.1 (H) 12/22/2019   HGBA1C 5.7 (H) 11/25/2015   HGBA1C 5.6 11/18/2014   ESRSEDRATE 72 (H) 12/22/2019   CRP 1.4 (H) 12/22/2019   LABURIC 4.1 06/05/2010   REPTSTATUS PENDING 04/01/2020   CULT  04/01/2020    NO GROWTH < 24 HOURS Performed at [facility], [place] [address] [place],  [address]      Lab Results  Component Value Date   ALBUMIN 2.5 (L) 04/01/2020   ALBUMIN 3.3 (L) 10/01/2019   ALBUMIN 3.3 (L) 01/11/2018   LABURIC 4.1 06/05/2010    Lab Results  Component Value Date   MG 2.0 09/24/2017   MG 1.6 (L) 09/23/2017   MG 1.7 11/25/2015   Lab Results  Component Value Date   VD25OH 85 01/10/2020   VD25OH 59 01/22/2019   VD25OH 34 10/25/2017    No results found for: PREALBUMIN CBC EXTENDED Latest Ref Rng & Units 04/01/2020 01/28/2020 12/22/2019  WBC 4.0 - 10.5 K/uL 8.2 - 8.2  RBC 3.87 - 5.11 MIL/uL 3.36(L) - 3.59(L)  HGB 12.0 -  15.0 g/dL 11.0(L) 11.6(L) 11.6(L)  HCT 36.0 - 46.0 % 33.9(L) 34.0(L) 36.6  PLT 150 - 400 K/uL 299 - 177  NEUTROABS 1.7 - 7.7 K/uL 5.1 - 4.5  LYMPHSABS 0.7 - 4.0 K/uL 2.0 - 2.9     Body mass index is 25.84 kg/m.  Orders:  No orders of the defined types were placed in this encounter.  No orders of the defined types were placed in this encounter.    Procedures: No procedures performed  Clinical Data: No additional findings.  ROS:  All other systems negative, except as noted in the HPI. Review of Systems  Objective: Vital Signs: Ht 5\' 7"  (1.702 m)   Wt 165 lb (74.8 kg)   BMI 25.84 kg/m   Specialty Comments:  No specialty comments available.  PMFS History: Patient Active Problem List   Diagnosis Date Noted   . Chronic pain 03/28/2020  . Fatty liver 03/28/2020  . Osteoporosis 03/28/2020  . Long term (current) use of opiate analgesic 03/28/2020  . 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 Cellulitis 12/23/2019  . Ulcer of right foot ([place]) 12/22/2019  . PVD (peripheral vascular disease) ([place]) 12/20/2019  . Incontinence in female 10/05/2019  . Urinary incontinence in female 10/02/2019  . CHF (congestive heart failure) ([place]) 10/02/2019  . Diverticulosis of colon 02/04/2018  . Hemorrhoid 02/04/2018  . FH: breast cancer in relative when <49 years old 01/25/2018  . Hematochezia   . GI bleed 10/03/2017  . Hip fracture ([place]) 09/22/2017  . AKI (acute kidney injury) ([place]) 09/22/2017  . At high risk for falls 01/31/2017  . Disorder of bone and cartilage 05/11/2016  . Normocytic anemia 01/08/2015  . Elevated ferritin 01/08/2015  . CAD in native artery 12/07/2014  . Need for influenza vaccination 05/10/2014  . Fasting hyperglycemia 11/05/2011  . Elevated transaminase level 01/08/2011  . Tobacco abuse   . Rheumatoid arthritis ([place])   . Colonic polyp   . 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 Peripheral vascular disease ([place]) 01/14/2010  . Macrocytic anemia 04/01/2009  . Hyperlipidemia 11/14/2008  . Essential hypertension 11/14/2008  . Hypertension 2005   Past Medical History:  Diagnosis Date  . AKI (acute kidney injury) ([place]) 09/22/2017  . Anemia   . 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 Arthritis   . CHF (congestive heart failure) ([place]) 10/02/2019  . Colonic polyp 2010   Hemorrhoids; h/o mild hematochezia  . CVA (cerebral infarction) 08/2008   sizable left -residual expressive aphasia, right sided weakness; ambulates with difficulty with the right leg brace   . Hyperlipidemia   . Hypertension 2005  . Osteoporosis 03/28/2020  . Rheumatoid  arthritis(714.0)   . Stroke [facility])     Family History  Problem Relation Age of Onset  . Cancer Father        prostate, deceased 36s  . Breast cancer Sister        Deceased 77s  . Heart attack Mother        deceased 29, during childbirth  . Cancer Brother        lymphoma  . Colon cancer Maternal Aunt        older than age 57  . Liver disease Neg Hx     Past Surgical History:  Procedure Laterality Date  . ABDOMINAL AORTOGRAM W/LOWER EXTREMITY Bilateral 01/28/2020   Procedure: ABDOMINAL  AORTOGRAM W/LOWER EXTREMITY;  Surgeon: Waynetta Sandy, MD;  Location: [place] [facility];  Service: Cardiovascular;  Laterality: Bilateral;  . ANKLE SURGERY     Right  . CAROTID STENT INSERTION    . COLONOSCOPY W/ POLYPECTOMY  11/2008   Dr. Gala Romney. Left-sided diverticula, Pedunculated polyp snared (no adenomatous changes)  . COLONOSCOPY WITH PROPOFOL N/A 01/12/2018   Procedure: COLONOSCOPY WITH PROPOFOL;  Surgeon: Daneil Dolin, MD;  [facility];  Service: Endoscopy;  Laterality: N/A;  . FEMUR IM NAIL Right 09/23/2017   Procedure: INTRAMEDULLARY (IM) NAIL FEMORAL;  Surgeon: Renette Butters, MD;  Location: [place];  Service: Orthopedics;  Laterality: Right;  . FOOT SURGERY Right 01/31/2020  . FRACTURE SURGERY     Hip replacement in May 2019 - fall related   . OOPHORECTOMY    . PERIPHERAL VASCULAR BALLOON ANGIOPLASTY Right 01/28/2020   Procedure: PERIPHERAL VASCULAR BALLOON ANGIOPLASTY;  Surgeon: Waynetta Sandy, MD;  Location: [place] [facility];  Service: Cardiovascular;  Laterality: Right;  SFA   Social History   Occupational History  . Occupation: disabilty x 9years    Comment: secondary to arthritis   Tobacco Use  . Smoking status: Current Every Day Smoker    Packs/day: 0.50    Years: 15.00    Pack years: 7.50    Types: Cigarettes  . Smokeless tobacco: Never Used  . Tobacco comment: quit after hospitized for hip fracture   Vaping Use  . Vaping Use: Never  used  Substance and Sexual Activity  . Alcohol use: No    Alcohol/week: 0.0 standard drinks    Comment: quit 6 years ago   . Drug use: No  . Sexual activity: Not on file

## 2020-04-06 LAB — CULTURE, BLOOD (ROUTINE X 2)
Culture: NO GROWTH
Culture: NO GROWTH
Special Requests: ADEQUATE
Special Requests: ADEQUATE

## 2020-04-07 ENCOUNTER — Encounter: Payer: Self-pay | Admitting: Orthopedic Surgery

## 2020-04-07 ENCOUNTER — Ambulatory Visit (INDEPENDENT_AMBULATORY_CARE_PROVIDER_SITE_OTHER): Payer: Medicare Other | Admitting: Physician Assistant

## 2020-04-07 VITALS — Ht 67.0 in | Wt 165.0 lb

## 2020-04-07 DIAGNOSIS — I96 Gangrene, not elsewhere classified: Secondary | ICD-10-CM

## 2020-04-07 DIAGNOSIS — I2510 Atherosclerotic heart disease of native coronary artery without angina pectoris: Secondary | ICD-10-CM

## 2020-04-07 MED ORDER — CLINDAMYCIN HCL 300 MG PO CAPS: ORAL_CAPSULE | ORAL | 0 refills | Status: DC

## 2020-04-07 MED ORDER — SULFAMETHOXAZOLE-TRIMETHOPRIM 800-160 MG PO TABS: 1.0000 | ORAL_TABLET | Freq: Two times a day (BID) | ORAL | 0 refills | Status: DC

## 2020-04-07 NOTE — Progress Notes (Signed)
Office Visit Note   Patient: Danielle Cruz           Date of Birth: 04-17-1956           MRN: [id number] Visit Date: 04/07/2020              Requested by: Fayrene Helper, MD [address] [place],  [place] [address] PCP: Fayrene Helper, MD  Chief Complaint  Patient presents with  . Right Leg - Follow-up    Compression wrap       HPI: This is a pleasant 64 year old woman who follows up on her right leg ulcerations to the dorsum of her foot and posterior calf.  She has been in an Building services engineer.  She has been on  Bactrim.  She is here for follow-up.  Assessment & Plan: Visit Diagnoses: No diagnosis found.  Plan: Continue with antibiotics.  We will rewrap her today follow-up in 1 week.  Follow-Up Instructions: No follow-ups on file.   Ortho Exam  Patient is alert, oriented, no adenopathy, well-dressed, normal affect, normal respiratory effort.  Focused examination demonstrates significant decrease in swelling from last visit.  She does have wrinkling of the skin.  The dorsal foot ulcer is massive however it does have increased healthy vascular fibrinous tissue over 50 to 60% of the wound.  Mild foul odor.  Posterior calf wound has improved 2.  With increased vascular healthy tissue.  No ascending cellulitis  Imaging: No results found. No images are attached to the encounter.  Labs: Lab Results  Component Value Date   HGBA1C 6.1 (H) 12/22/2019   HGBA1C 5.7 (H) 11/25/2015   HGBA1C 5.6 11/18/2014   ESRSEDRATE 72 (H) 12/22/2019   CRP 1.4 (H) 12/22/2019   LABURIC 4.1 06/05/2010   REPTSTATUS 04/06/2020 FINAL 04/01/2020   CULT  04/01/2020    NO GROWTH 5 DAYS Performed at [facility], [place] [address], [place], [place] [address]      Lab Results  Component Value Date   ALBUMIN 2.5 (L) 04/01/2020   ALBUMIN 3.3 (L) 10/01/2019   ALBUMIN 3.3 (L) 01/11/2018   LABURIC 4.1 06/05/2010    Lab Results  Component Value Date   MG 2.0 09/24/2017   MG 1.6 (L)  09/23/2017   MG 1.7 11/25/2015   Lab Results  Component Value Date   VD25OH 85 01/10/2020   VD25OH 59 01/22/2019   VD25OH 34 10/25/2017    No results found for: PREALBUMIN CBC EXTENDED Latest Ref Rng & Units 04/01/2020 01/28/2020 12/22/2019  WBC 4.0 - 10.5 K/uL 8.2 - 8.2  RBC 3.87 - 5.11 MIL/uL 3.36(L) - 3.59(L)  HGB 12.0 - 15.0 g/dL 11.0(L) 11.6(L) 11.6(L)  HCT 36.0 - 46.0 % 33.9(L) 34.0(L) 36.6  PLT 150 - 400 K/uL 299 - 177  NEUTROABS 1.7 - 7.7 K/uL 5.1 - 4.5  LYMPHSABS 0.7 - 4.0 K/uL 2.0 - 2.9     Body mass index is 25.84 kg/m.  Orders:  No orders of the defined types were placed in this encounter.  Meds ordered this encounter  Medications  . clindamycin (CLEOCIN) 300 MG capsule    Sig: Take one capsule by mouth three times daily for 10 days    Dispense:  30 capsule    Refill:  0  . sulfamethoxazole-trimethoprim (BACTRIM DS) 800-160 MG tablet    Sig: Take 1 tablet by mouth 2 (two) times daily.    Dispense:  20 tablet    Refill:  0     Procedures: No procedures performed  Clinical Data: No additional findings.  ROS:  All other systems negative, except as noted in the HPI. Review of Systems  Objective: Vital Signs: Ht 5\' 7"  (1.702 m)   Wt 165 lb (74.8 kg)   BMI 25.84 kg/m   Specialty Comments:  No specialty comments available.  PMFS History: Patient Active Problem List   Diagnosis Date Noted  . Chronic pain 03/28/2020  . Fatty liver 03/28/2020  . Osteoporosis 03/28/2020  . Long term (current) use of opiate analgesic 03/28/2020  . 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 Cellulitis 12/23/2019  . Ulcer of right foot ([place]) 12/22/2019  . PVD (peripheral vascular disease) ([place]) 12/20/2019  . Incontinence in female 10/05/2019  . Urinary incontinence in female 10/02/2019  . CHF (congestive heart failure) ([place]) 10/02/2019  . Diverticulosis of colon 02/04/2018  . Hemorrhoid 02/04/2018  . FH: breast cancer in  relative when <76 years old 01/25/2018  . Hematochezia   . GI bleed 10/03/2017  . Hip fracture ([place]) 09/22/2017  . AKI (acute kidney injury) ([place]) 09/22/2017  . At high risk for falls 01/31/2017  . Disorder of bone and cartilage 05/11/2016  . Normocytic anemia 01/08/2015  . Elevated ferritin 01/08/2015  . CAD in native artery 12/07/2014  . Need for influenza vaccination 05/10/2014  . Fasting hyperglycemia 11/05/2011  . Elevated transaminase level 01/08/2011  . Tobacco abuse   . Rheumatoid arthritis ([place])   . Colonic polyp   . Hemiplegia, late effect of cerebrovascular disease ([place]) 03/16/2010  . Peripheral vascular disease ([place]) 01/14/2010  . Macrocytic anemia 04/01/2009  . Hyperlipidemia 11/14/2008  . Essential hypertension 11/14/2008  . Hypertension 2005   Past Medical History:  Diagnosis Date  . AKI (acute kidney injury) ([place]) 09/22/2017  . Anemia   . 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 Arthritis   . CHF (congestive heart failure) ([place]) 10/02/2019  . Colonic polyp 2010   Hemorrhoids; h/o mild hematochezia  . CVA (cerebral infarction) 08/2008   sizable left -residual expressive aphasia, right sided weakness; ambulates with difficulty with the right leg brace   . Hyperlipidemia   . Hypertension 2005  . Osteoporosis 03/28/2020  . Rheumatoid arthritis(714.0)   . Stroke [facility])     Family History  Problem Relation Age of Onset  . Cancer Father        prostate, deceased 62s  . Breast cancer Sister        Deceased 107s  . Heart attack Mother        deceased 47, during childbirth  . Cancer Brother        lymphoma  . Colon cancer Maternal Aunt        older than age 91  . Liver disease Neg Hx     Past Surgical History:  Procedure Laterality Date  . ABDOMINAL AORTOGRAM W/LOWER EXTREMITY Bilateral 01/28/2020   Procedure: ABDOMINAL AORTOGRAM W/LOWER EXTREMITY;  Surgeon: Waynetta Sandy, MD;   Location: [facility];  Service: Cardiovascular;  Laterality: Bilateral;  . ANKLE SURGERY     Right  . CAROTID STENT INSERTION    . COLONOSCOPY W/ POLYPECTOMY  11/2008   Dr. Gala Romney. Left-sided diverticula, Pedunculated polyp snared (no adenomatous changes)  . COLONOSCOPY WITH PROPOFOL N/A 01/12/2018   Procedure: COLONOSCOPY WITH PROPOFOL;  Surgeon: Daneil Dolin, MD;  [facility];  Service: Endoscopy;  Laterality: N/A;  . FEMUR IM NAIL Right 09/23/2017   Procedure: INTRAMEDULLARY (IM) NAIL FEMORAL;  Surgeon: Renette Butters, MD;  Location: [facility];  Service: Orthopedics;  Laterality: Right;  . FOOT SURGERY Right 01/31/2020  . FRACTURE SURGERY     Hip replacement in May 2019 - fall related   . OOPHORECTOMY    . PERIPHERAL VASCULAR BALLOON ANGIOPLASTY Right 01/28/2020   Procedure: PERIPHERAL VASCULAR BALLOON ANGIOPLASTY;  Surgeon: Waynetta Sandy, MD;  Location: [facility];  Service: Cardiovascular;  Laterality: Right;  SFA   Social History   Occupational History  . Occupation: disabilty x 9years    Comment: secondary to arthritis   Tobacco Use  . Smoking status: Current Every Day Smoker    Packs/day: 0.50    Years: 15.00    Pack years: 7.50    Types: Cigarettes  . Smokeless tobacco: Never Used  . Tobacco comment: quit after hospitized for hip fracture   Vaping Use  . Vaping Use: Never used  Substance and Sexual Activity  . Alcohol use: No    Alcohol/week: 0.0 standard drinks    Comment: quit 6 years ago   . Drug use: No  . Sexual activity: Not on file

## 2020-04-07 NOTE — Progress Notes (Signed)
The preliminary results show that the bacteria in her wound are sensitive to the bactrim that we prescribed.  She is now in the care of Dr. Sharol Given for her wound. IF she has any further issues with her wound, she should see Dr. Sharol Given.

## 2020-04-10 LAB — WOUND CULTURE

## 2020-04-10 NOTE — Progress Notes (Signed)
Final results are in, and the klebsiella that grew should respond to bactrim.  She should still f/u with Dr. Sharol Given about her leg wound if she has any issues.

## 2020-04-14 ENCOUNTER — Ambulatory Visit (INDEPENDENT_AMBULATORY_CARE_PROVIDER_SITE_OTHER): Payer: Medicare Other | Admitting: Physician Assistant

## 2020-04-14 ENCOUNTER — Encounter: Payer: Self-pay | Admitting: Physician Assistant

## 2020-04-14 VITALS — Ht 67.0 in | Wt 165.0 lb

## 2020-04-14 DIAGNOSIS — I96 Gangrene, not elsewhere classified: Secondary | ICD-10-CM

## 2020-04-14 MED ORDER — HYDROCODONE-ACETAMINOPHEN 5-325 MG PO TABS: 1.0000 | ORAL_TABLET | Freq: Every day | ORAL | 0 refills | Status: DC | PRN

## 2020-04-14 NOTE — Progress Notes (Signed)
Office Visit Note   Patient: Danielle Cruz           Date of Birth: [birth year]           MRN: [id number] Visit Date: 04/14/2020              Requested by: Fayrene Helper, MD [address] [place],  [place] [address] PCP: Fayrene Helper, MD  Chief Complaint  Patient presents with  . Right Leg - Follow-up      HPI: Follows up today for her right leg.  She has been doing serial wrapping twice a week.  Because of the holiday she has not been seen in a week.  Assessment & Plan: Visit Diagnoses: No diagnosis found.  Plan: We will continue with wraps until Thursday when Dr. Sharol Given will reevaluate.  I am encouraged by better vascularization from the wounds but still has significant defects especially in the posterior aspect of her calf  Follow-Up Instructions: No follow-ups on file.   Ortho Exam  Patient is alert, oriented, no adenopathy, well-dressed, normal affect, normal respiratory effort. Right lower extremity dorsal foot wound has about 80 present vascular fibrinous tissue and only aced 20% just fibrinous tissue.  She does have a good healthy red wound bed throughout most of the wound.  Posterior calf tends to be more fibrinous tissue with some fibrinous exudates drainage.  No foul odor.  Swelling has decreased.  No ascending cellulitis  Imaging: No results found. No images are attached to the encounter.  Labs: Lab Results  Component Value Date   HGBA1C 6.1 (H) 12/22/2019   HGBA1C 5.7 (H) 11/25/2015   HGBA1C 5.6 11/18/2014   ESRSEDRATE 72 (H) 12/22/2019   CRP 1.4 (H) 12/22/2019   LABURIC 4.1 06/05/2010   REPTSTATUS 04/06/2020 FINAL 04/01/2020   CULT  04/01/2020    NO GROWTH 5 DAYS Performed at [facility], [place] [address], [place], [place] [address]      Lab Results  Component Value Date   ALBUMIN 2.5 (L) 04/01/2020   ALBUMIN 3.3 (L) 10/01/2019   ALBUMIN 3.3 (L) 01/11/2018   LABURIC 4.1 06/05/2010    Lab Results  Component Value Date    MG 2.0 09/24/2017   MG 1.6 (L) 09/23/2017   MG 1.7 11/25/2015   Lab Results  Component Value Date   VD25OH 85 01/10/2020   VD25OH 59 01/22/2019   VD25OH 34 10/25/2017    No results found for: PREALBUMIN CBC EXTENDED Latest Ref Rng & Units 04/01/2020 01/28/2020 12/22/2019  WBC 4.0 - 10.5 K/uL 8.2 - 8.2  RBC 3.87 - 5.11 MIL/uL 3.36(L) - 3.59(L)  HGB 12.0 - 15.0 g/dL 11.0(L) 11.6(L) 11.6(L)  HCT 36.0 - 46.0 % 33.9(L) 34.0(L) 36.6  PLT 150 - 400 K/uL 299 - 177  NEUTROABS 1.7 - 7.7 K/uL 5.1 - 4.5  LYMPHSABS 0.7 - 4.0 K/uL 2.0 - 2.9     Body mass index is 25.84 kg/m.  Orders:  No orders of the defined types were placed in this encounter.  Meds ordered this encounter  Medications  . HYDROcodone-acetaminophen (NORCO/VICODIN) 5-325 MG tablet    Sig: Take 1 tablet by mouth daily as needed for moderate pain.    Dispense:  30 tablet    Refill:  0     Procedures: No procedures performed  Clinical Data: No additional findings.  ROS:  All other systems negative, except as noted in the HPI. Review of Systems  Objective:  Vital Signs: Ht 5\' 7"  (1.702 m)   Wt 165 lb (74.8 kg)   BMI 25.84 kg/m   Specialty Comments:  No specialty comments available.  PMFS History: Patient Active Problem List   Diagnosis Date Noted  . Chronic pain 03/28/2020  . Fatty liver 03/28/2020  . Osteoporosis 03/28/2020  . Long term (current) use of opiate analgesic 03/28/2020  . 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 Cellulitis 12/23/2019  . Ulcer of right foot ([facility]) 12/22/2019  . PVD (peripheral vascular disease) ([facility]) 12/20/2019  . Incontinence in female 10/05/2019  . Urinary incontinence in female 10/02/2019  . CHF (congestive heart failure) ([facility]) 10/02/2019  . Diverticulosis of colon 02/04/2018  . Hemorrhoid 02/04/2018  . FH: breast cancer in relative when <66 years old 01/25/2018  . Hematochezia   . GI bleed 10/03/2017  . Hip fracture ([facility])  09/22/2017  . AKI (acute kidney injury) ([facility]) 09/22/2017  . At high risk for falls 01/31/2017  . Disorder of bone and cartilage 05/11/2016  . Normocytic anemia 01/08/2015  . Elevated ferritin 01/08/2015  . CAD in native artery 12/07/2014  . Need for influenza vaccination 05/10/2014  . Fasting hyperglycemia 11/05/2011  . Elevated transaminase level 01/08/2011  . Tobacco abuse   . Rheumatoid arthritis ([facility])   . Colonic polyp   . Hemiplegia, late effect of cerebrovascular disease ([facility]) 03/16/2010  . Peripheral vascular disease ([facility]) 01/14/2010  . Macrocytic anemia 04/01/2009  . Hyperlipidemia 11/14/2008  . Essential hypertension 11/14/2008  . Hypertension 2005   Past Medical History:  Diagnosis Date  . AKI (acute kidney injury) ([facility]) 09/22/2017  . Anemia   . 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 Arthritis   . CHF (congestive heart failure) ([facility]) 10/02/2019  . Colonic polyp 2010   Hemorrhoids; h/o mild hematochezia  . CVA (cerebral infarction) 08/2008   sizable left -residual expressive aphasia, right sided weakness; ambulates with difficulty with the right leg brace   . Hyperlipidemia   . Hypertension 2005  . Osteoporosis 03/28/2020  . Rheumatoid arthritis(714.0)   . Stroke [facility])     Family History  Problem Relation Age of Onset  . Cancer Father        prostate, deceased 30s  . Breast cancer Sister        Deceased 9s  . Heart attack Mother        deceased 53, during childbirth  . Cancer Brother        lymphoma  . Colon cancer Maternal Aunt        older than age 44  . Liver disease Neg Hx     Past Surgical History:  Procedure Laterality Date  . ABDOMINAL AORTOGRAM W/LOWER EXTREMITY Bilateral 01/28/2020   Procedure: ABDOMINAL AORTOGRAM W/LOWER EXTREMITY;  Surgeon: 03/29/2020, MD;  Location: [facility];  Service: Cardiovascular;  Laterality: Bilateral;  . ANKLE SURGERY      Right  . CAROTID STENT INSERTION    . COLONOSCOPY W/ POLYPECTOMY  11/2008   Dr. 12/2008. Left-sided diverticula, Pedunculated polyp snared (no adenomatous changes)  . COLONOSCOPY WITH PROPOFOL N/A 01/12/2018   Procedure: COLONOSCOPY WITH PROPOFOL;  Surgeon: 01/14/2018, MD;  [facility];  Service: Endoscopy;  Laterality: N/A;  . FEMUR IM NAIL Right 09/23/2017   Procedure: INTRAMEDULLARY (IM) NAIL FEMORAL;  Surgeon: 11/23/2017, MD;  Location:  [facility];  Service: Orthopedics;  Laterality: Right;  . FOOT SURGERY Right 01/31/2020  . FRACTURE SURGERY     Hip replacement in May 2019 - fall related   . OOPHORECTOMY    . PERIPHERAL VASCULAR BALLOON ANGIOPLASTY Right 01/28/2020   Procedure: PERIPHERAL VASCULAR BALLOON ANGIOPLASTY;  Surgeon: Waynetta Sandy, MD;  Location: [place];  Service: Cardiovascular;  Laterality: Right;  SFA   Social History   Occupational History  . Occupation: disabilty x 9years    Comment: secondary to arthritis   Tobacco Use  . Smoking status: Current Every Day Smoker    Packs/day: 0.50    Years: 15.00    Pack years: 7.50    Types: Cigarettes  . Smokeless tobacco: Never Used  . Tobacco comment: quit after hospitized for hip fracture   Vaping Use  . Vaping Use: Never used  Substance and Sexual Activity  . Alcohol use: No    Alcohol/week: 0.0 standard drinks    Comment: quit 6 years ago   . Drug use: No  . Sexual activity: Not on file

## 2020-04-16 ENCOUNTER — Ambulatory Visit: Payer: Medicare Other | Admitting: Orthopedic Surgery

## 2020-04-22 ENCOUNTER — Inpatient Hospital Stay (HOSPITAL_COMMUNITY)
Admission: EM | Admit: 2020-04-22 | Discharge: 2020-04-23 | DRG: 863 | Disposition: A | Discharge status: Home / Self Care | Payer: Medicare Other | Attending: Family Medicine | Admitting: Family Medicine

## 2020-04-22 ENCOUNTER — Encounter (HOSPITAL_COMMUNITY): Payer: Self-pay | Admitting: Emergency Medicine

## 2020-04-22 ENCOUNTER — Other Ambulatory Visit: Payer: Self-pay

## 2020-04-22 ENCOUNTER — Emergency Department (HOSPITAL_COMMUNITY): Payer: Medicare Other

## 2020-04-22 DIAGNOSIS — I7025 Atherosclerosis of native arteries of other extremities with ulceration: Secondary | ICD-10-CM | POA: Diagnosis present

## 2020-04-22 DIAGNOSIS — R404 Transient alteration of awareness: Secondary | ICD-10-CM | POA: Diagnosis not present

## 2020-04-22 DIAGNOSIS — R58 Hemorrhage, not elsewhere classified: Secondary | ICD-10-CM | POA: Diagnosis not present

## 2020-04-22 DIAGNOSIS — I739 Peripheral vascular disease, unspecified: Secondary | ICD-10-CM | POA: Diagnosis not present

## 2020-04-22 DIAGNOSIS — R52 Pain, unspecified: Secondary | ICD-10-CM | POA: Diagnosis not present

## 2020-04-22 DIAGNOSIS — Z96649 Presence of unspecified artificial hip joint: Secondary | ICD-10-CM | POA: Diagnosis present

## 2020-04-22 DIAGNOSIS — L97912 Non-pressure chronic ulcer of unspecified part of right lower leg with fat layer exposed: Secondary | ICD-10-CM | POA: Diagnosis not present

## 2020-04-22 DIAGNOSIS — Z20822 Contact with and (suspected) exposure to covid-19: Secondary | ICD-10-CM | POA: Diagnosis present

## 2020-04-22 DIAGNOSIS — Y839 Surgical procedure, unspecified as the cause of abnormal reaction of the patient, or of later complication, without mention of misadventure at the time of the procedure: Secondary | ICD-10-CM | POA: Diagnosis present

## 2020-04-22 DIAGNOSIS — Z79899 Other long term (current) drug therapy: Secondary | ICD-10-CM | POA: Diagnosis not present

## 2020-04-22 DIAGNOSIS — L03115 Cellulitis of right lower limb: Secondary | ICD-10-CM | POA: Diagnosis present

## 2020-04-22 DIAGNOSIS — Z8673 Personal history of transient ischemic attack (TIA), and cerebral infarction without residual deficits: Secondary | ICD-10-CM | POA: Diagnosis not present

## 2020-04-22 DIAGNOSIS — Z8249 Family history of ischemic heart disease and other diseases of the circulatory system: Secondary | ICD-10-CM

## 2020-04-22 DIAGNOSIS — I5032 Chronic diastolic (congestive) heart failure: Secondary | ICD-10-CM | POA: Diagnosis not present

## 2020-04-22 DIAGNOSIS — M810 Age-related osteoporosis without current pathological fracture: Secondary | ICD-10-CM | POA: Diagnosis present

## 2020-04-22 DIAGNOSIS — Z720 Tobacco use: Secondary | ICD-10-CM | POA: Diagnosis not present

## 2020-04-22 DIAGNOSIS — E785 Hyperlipidemia, unspecified: Secondary | ICD-10-CM | POA: Diagnosis present

## 2020-04-22 DIAGNOSIS — I110 Hypertensive heart disease with heart failure: Secondary | ICD-10-CM | POA: Diagnosis present

## 2020-04-22 DIAGNOSIS — I96 Gangrene, not elsewhere classified: Secondary | ICD-10-CM | POA: Diagnosis present

## 2020-04-22 DIAGNOSIS — I2510 Atherosclerotic heart disease of native coronary artery without angina pectoris: Secondary | ICD-10-CM | POA: Diagnosis present

## 2020-04-22 DIAGNOSIS — I509 Heart failure, unspecified: Secondary | ICD-10-CM | POA: Diagnosis present

## 2020-04-22 DIAGNOSIS — I69320 Aphasia following cerebral infarction: Secondary | ICD-10-CM

## 2020-04-22 DIAGNOSIS — I69351 Hemiplegia and hemiparesis following cerebral infarction affecting right dominant side: Secondary | ICD-10-CM | POA: Diagnosis not present

## 2020-04-22 DIAGNOSIS — I10 Essential (primary) hypertension: Secondary | ICD-10-CM | POA: Diagnosis present

## 2020-04-22 DIAGNOSIS — E876 Hypokalemia: Secondary | ICD-10-CM | POA: Diagnosis present

## 2020-04-22 DIAGNOSIS — Z7902 Long term (current) use of antithrombotics/antiplatelets: Secondary | ICD-10-CM | POA: Diagnosis not present

## 2020-04-22 DIAGNOSIS — L97218 Non-pressure chronic ulcer of right calf with other specified severity: Secondary | ICD-10-CM | POA: Diagnosis not present

## 2020-04-22 DIAGNOSIS — W19XXXA Unspecified fall, initial encounter: Secondary | ICD-10-CM | POA: Diagnosis not present

## 2020-04-22 DIAGNOSIS — F17210 Nicotine dependence, cigarettes, uncomplicated: Secondary | ICD-10-CM | POA: Diagnosis present

## 2020-04-22 DIAGNOSIS — I69959 Hemiplegia and hemiparesis following unspecified cerebrovascular disease affecting unspecified side: Secondary | ICD-10-CM

## 2020-04-22 DIAGNOSIS — T8149XA Infection following a procedure, other surgical site, initial encounter: Principal | ICD-10-CM | POA: Diagnosis present

## 2020-04-22 DIAGNOSIS — L97519 Non-pressure chronic ulcer of other part of right foot with unspecified severity: Secondary | ICD-10-CM | POA: Diagnosis present

## 2020-04-22 DIAGNOSIS — M14671 Charcot's joint, right ankle and foot: Secondary | ICD-10-CM | POA: Diagnosis present

## 2020-04-22 DIAGNOSIS — Z955 Presence of coronary angioplasty implant and graft: Secondary | ICD-10-CM

## 2020-04-22 DIAGNOSIS — M069 Rheumatoid arthritis, unspecified: Secondary | ICD-10-CM | POA: Diagnosis present

## 2020-04-22 LAB — COMPREHENSIVE METABOLIC PANEL
ALT: 37 U/L (ref 0–44)
AST: 36 U/L (ref 15–41)
Albumin: 2.6 g/dL — ABNORMAL LOW (ref 3.5–5.0)
Alkaline Phosphatase: 130 U/L — ABNORMAL HIGH (ref 38–126)
Anion gap: 12 (ref 5–15)
BUN: 9 mg/dL (ref 8–23)
CO2: 21 mmol/L — ABNORMAL LOW (ref 22–32)
Calcium: 8.8 mg/dL — ABNORMAL LOW (ref 8.9–10.3)
Chloride: 106 mmol/L (ref 98–111)
Creatinine, Ser: 0.76 mg/dL (ref 0.44–1.00)
GFR, Estimated: >60 mL/min (ref >60)
Glucose, Bld: 153 mg/dL — ABNORMAL HIGH (ref 70–99)
Potassium: 2.9 mmol/L — ABNORMAL LOW (ref 3.5–5.1)
Sodium: 139 mmol/L (ref 135–145)
Total Bilirubin: 0.3 mg/dL (ref 0.3–1.2)
Total Protein: 8.2 g/dL — ABNORMAL HIGH (ref 6.5–8.1)

## 2020-04-22 LAB — CBC WITH DIFFERENTIAL/PLATELET
Abs Immature Granulocytes: 0.09 10*3/uL — ABNORMAL HIGH (ref 0.00–0.07)
Basophils Absolute: 0.1 10*3/uL (ref 0.0–0.1)
Basophils Relative: 0 %
Eosinophils Absolute: 0.1 10*3/uL (ref 0.0–0.5)
Eosinophils Relative: 1 %
HCT: 31.4 % — ABNORMAL LOW (ref 36.0–46.0)
Hemoglobin: 9.8 g/dL — ABNORMAL LOW (ref 12.0–15.0)
Immature Granulocytes: 1 %
Lymphocytes Relative: 29 %
Lymphs Abs: 4.3 10*3/uL — ABNORMAL HIGH (ref 0.7–4.0)
MCH: 33 pg (ref 26.0–34.0)
MCHC: 31.2 g/dL (ref 30.0–36.0)
MCV: 105.7 fL — ABNORMAL HIGH (ref 80.0–100.0)
Monocytes Absolute: 1.5 10*3/uL — ABNORMAL HIGH (ref 0.1–1.0)
Monocytes Relative: 10 %
Neutro Abs: 8.8 10*3/uL — ABNORMAL HIGH (ref 1.7–7.7)
Neutrophils Relative %: 59 %
Platelets: 342 10*3/uL (ref 150–400)
RBC: 2.97 MIL/uL — ABNORMAL LOW (ref 3.87–5.11)
RDW: 17.8 % — ABNORMAL HIGH (ref 11.5–15.5)
WBC: 14.9 10*3/uL — ABNORMAL HIGH (ref 4.0–10.5)
nRBC: 0 % (ref 0.0–0.2)

## 2020-04-22 LAB — SEDIMENTATION RATE: Sed Rate: 125 mm/hr — ABNORMAL HIGH (ref 0–22)

## 2020-04-22 LAB — PROTIME-INR
INR: 1.3 — ABNORMAL HIGH (ref 0.8–1.2)
Prothrombin Time: 15.3 seconds — ABNORMAL HIGH (ref 11.4–15.2)

## 2020-04-22 MED ORDER — ENOXAPARIN SODIUM 40 MG/0.4ML ~~LOC~~ SOLN
40.0000 mg | SUBCUTANEOUS | Status: DC
  Administered 2020-04-23: 40 mg via SUBCUTANEOUS
  Filled 2020-04-22 (×2): qty 0.4

## 2020-04-22 MED ORDER — PANTOPRAZOLE SODIUM 40 MG PO TBEC
40.0000 mg | DELAYED_RELEASE_TABLET | Freq: Every day | ORAL | Status: DC
  Administered 2020-04-22 – 2020-04-23 (×2): 40 mg via ORAL
  Filled 2020-04-22 (×2): qty 1

## 2020-04-22 MED ORDER — SODIUM CHLORIDE 0.9 % IV SOLN
2.0000 g | Freq: Three times a day (TID) | INTRAVENOUS | Status: DC
  Administered 2020-04-23 (×2): 2 g via INTRAVENOUS
  Filled 2020-04-22 (×3): qty 2

## 2020-04-22 MED ORDER — ADULT MULTIVITAMIN W/MINERALS CH
1.0000 | ORAL_TABLET | Freq: Every day | ORAL | Status: DC
  Administered 2020-04-23: 1 via ORAL
  Filled 2020-04-22 (×2): qty 1

## 2020-04-22 MED ORDER — POLYETHYLENE GLYCOL 3350 17 G PO PACK: 17.0000 g | PACK | Freq: Every day | ORAL | Status: DC | PRN

## 2020-04-22 MED ORDER — ACETAMINOPHEN 325 MG PO TABS: 650.0000 mg | ORAL_TABLET | Freq: Four times a day (QID) | ORAL | Status: DC | PRN

## 2020-04-22 MED ORDER — OXYBUTYNIN CHLORIDE 5 MG PO TABS
2.5000 mg | ORAL_TABLET | Freq: Two times a day (BID) | ORAL | Status: DC
  Administered 2020-04-23: 2.5 mg via ORAL
  Filled 2020-04-22: qty 1

## 2020-04-22 MED ORDER — ONDANSETRON HCL 4 MG/2ML IJ SOLN: 4.0000 mg | Freq: Four times a day (QID) | INTRAMUSCULAR | Status: DC | PRN

## 2020-04-22 MED ORDER — CLOPIDOGREL BISULFATE 75 MG PO TABS
75.0000 mg | ORAL_TABLET | Freq: Every day | ORAL | Status: DC
  Administered 2020-04-22 – 2020-04-23 (×2): 75 mg via ORAL
  Filled 2020-04-22 (×2): qty 1

## 2020-04-22 MED ORDER — SODIUM CHLORIDE 0.9 % IV SOLN
2.0000 g | Freq: Once | INTRAVENOUS | Status: AC
  Administered 2020-04-22: 2 g via INTRAVENOUS
  Filled 2020-04-22: qty 2

## 2020-04-22 MED ORDER — ROSUVASTATIN CALCIUM 20 MG PO TABS
20.0000 mg | ORAL_TABLET | ORAL | Status: DC
  Administered 2020-04-23: 20 mg via ORAL
  Filled 2020-04-22 (×2): qty 1

## 2020-04-22 MED ORDER — EZETIMIBE 10 MG PO TABS
10.0000 mg | ORAL_TABLET | Freq: Every day | ORAL | Status: DC
  Administered 2020-04-22 – 2020-04-23 (×2): 10 mg via ORAL
  Filled 2020-04-22 (×4): qty 1

## 2020-04-22 MED ORDER — HYDROCODONE-ACETAMINOPHEN 5-325 MG PO TABS
1.0000 | ORAL_TABLET | Freq: Every day | ORAL | Status: DC | PRN
  Administered 2020-04-23: 1 via ORAL
  Filled 2020-04-22: qty 1

## 2020-04-22 MED ORDER — NICOTINE 14 MG/24HR TD PT24
14.0000 mg | MEDICATED_PATCH | Freq: Every day | TRANSDERMAL | Status: DC
  Administered 2020-04-22 – 2020-04-23 (×2): 14 mg via TRANSDERMAL
  Filled 2020-04-22 (×2): qty 1

## 2020-04-22 MED ORDER — POTASSIUM CHLORIDE CRYS ER 20 MEQ PO TBCR
20.0000 meq | EXTENDED_RELEASE_TABLET | Freq: Every day | ORAL | Status: DC
  Administered 2020-04-22 – 2020-04-23 (×2): 20 meq via ORAL
  Filled 2020-04-22 (×2): qty 1

## 2020-04-22 MED ORDER — SODIUM CHLORIDE 0.9% FLUSH: 3.0000 mL | INTRAVENOUS | Status: DC | PRN

## 2020-04-22 MED ORDER — TORSEMIDE 20 MG PO TABS
10.0000 mg | ORAL_TABLET | Freq: Every day | ORAL | Status: DC
Start: 2020-04-22 — End: 2020-04-23
  Administered 2020-04-23: 10 mg via ORAL
  Filled 2020-04-22: qty 1
  Filled 2020-04-22 (×2): qty 0.5

## 2020-04-22 MED ORDER — MONTELUKAST SODIUM 10 MG PO TABS: 10.0000 mg | ORAL_TABLET | Freq: Every day | ORAL | Status: DC

## 2020-04-22 MED ORDER — VANCOMYCIN HCL 1250 MG/250ML IV SOLN
1250.0000 mg | INTRAVENOUS | Status: DC
  Filled 2020-04-22: qty 250

## 2020-04-22 MED ORDER — SODIUM CHLORIDE 0.9% FLUSH
3.0000 mL | Freq: Two times a day (BID) | INTRAVENOUS | Status: DC
  Administered 2020-04-23: 3 mL via INTRAVENOUS

## 2020-04-22 MED ORDER — ONDANSETRON HCL 4 MG PO TABS: 4.0000 mg | ORAL_TABLET | Freq: Four times a day (QID) | ORAL | Status: DC | PRN

## 2020-04-22 MED ORDER — VANCOMYCIN HCL 1750 MG/350ML IV SOLN
1750.0000 mg | Freq: Once | INTRAVENOUS | Status: AC
  Administered 2020-04-22: 1750 mg via INTRAVENOUS
  Filled 2020-04-22: qty 350

## 2020-04-22 MED ORDER — SODIUM CHLORIDE 0.9 % IV SOLN: 250.0000 mL | INTRAVENOUS | Status: DC | PRN

## 2020-04-22 MED ORDER — HYDROXYCHLOROQUINE SULFATE 200 MG PO TABS
400.0000 mg | ORAL_TABLET | Freq: Every day | ORAL | Status: DC
  Administered 2020-04-22 – 2020-04-23 (×2): 400 mg via ORAL
  Filled 2020-04-22 (×2): qty 2

## 2020-04-22 MED ORDER — METOPROLOL SUCCINATE ER 25 MG PO TB24
25.0000 mg | ORAL_TABLET | Freq: Every day | ORAL | Status: DC
  Administered 2020-04-22 – 2020-04-23 (×2): 25 mg via ORAL
  Filled 2020-04-22 (×2): qty 1

## 2020-04-22 MED ORDER — AMLODIPINE BESYLATE 5 MG PO TABS
5.0000 mg | ORAL_TABLET | Freq: Every day | ORAL | Status: DC
  Administered 2020-04-22 – 2020-04-23 (×2): 5 mg via ORAL
  Filled 2020-04-22 (×2): qty 1

## 2020-04-22 MED ORDER — BISACODYL 5 MG PO TBEC: 5.0000 mg | DELAYED_RELEASE_TABLET | Freq: Every day | ORAL | Status: DC | PRN

## 2020-04-22 NOTE — ED Notes (Signed)
Lab to draw cultures, awaiting this to start ABT due limited access to left arm only.

## 2020-04-22 NOTE — ED Provider Notes (Signed)
[facility] EMERGENCY DEPARTMENT Provider Note  CSN: [id number] Arrival date & time: 04/22/20 1234    History Chief Complaint  Patient presents with  . Leg Wound    HPI  Makynzi C Aminov is a 65 y.o. female with known history of PVD s/p re-vascualrization procedure on R leg with known chronic wounds, treated by Dr. Sharol Given with wound care and una wraps. Has been getting antibiotics. Last visit to ortho was on 12/27. Today she began having bleeding through her dressing and so EMS was called. Brought to the ED for evaluation. She is on Plavix from previous stroke.    Past Medical History:  Diagnosis Date  . AKI (acute kidney injury) ([place]) 09/22/2017  . Anemia   . 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 Arthritis   . CHF (congestive heart failure) ([place]) 10/02/2019  . Colonic polyp 2010   Hemorrhoids; h/o mild hematochezia  . CVA (cerebral infarction) 08/2008   sizable left -residual expressive aphasia, right sided weakness; ambulates with difficulty with the right leg brace   . Hyperlipidemia   . Hypertension 2005  . Osteoporosis 03/28/2020  . Rheumatoid arthritis(714.0)   . Stroke [facility])     Past Surgical History:  Procedure Laterality Date  . ABDOMINAL AORTOGRAM W/LOWER EXTREMITY Bilateral 01/28/2020   Procedure: ABDOMINAL AORTOGRAM W/LOWER EXTREMITY;  Surgeon: Waynetta Sandy, MD;  Location: [place] CV LAB;  Service: Cardiovascular;  Laterality: Bilateral;  . ANKLE SURGERY     Right  . CAROTID STENT INSERTION    . COLONOSCOPY W/ POLYPECTOMY  11/2008   Dr. Gala Romney. Left-sided diverticula, Pedunculated polyp snared (no adenomatous changes)  . COLONOSCOPY WITH PROPOFOL N/A 01/12/2018   Procedure: COLONOSCOPY WITH PROPOFOL;  Surgeon: Daneil Dolin, MD;  [facility];  Service: Endoscopy;  Laterality: N/A;  . FEMUR IM NAIL Right 09/23/2017   Procedure: INTRAMEDULLARY (IM) NAIL FEMORAL;   Surgeon: Renette Butters, MD;  Location: [place];  Service: Orthopedics;  Laterality: Right;  . FOOT SURGERY Right 01/31/2020  . FRACTURE SURGERY     Hip replacement in May 2019 - fall related   . OOPHORECTOMY    . PERIPHERAL VASCULAR BALLOON ANGIOPLASTY Right 01/28/2020   Procedure: PERIPHERAL VASCULAR BALLOON ANGIOPLASTY;  Surgeon: Waynetta Sandy, MD;  Location: [place] CV LAB;  Service: Cardiovascular;  Laterality: Right;  SFA    Family History  Problem Relation Age of Onset  . Cancer Father        prostate, deceased 58s  . Breast cancer Sister        Deceased 9s  . Heart attack Mother        deceased 65, during childbirth  . Cancer Brother        lymphoma  . Colon cancer Maternal Aunt        older than age 93  . Liver disease Neg Hx     Social History   Tobacco Use  . Smoking status: Current Every Day Smoker    Packs/day: 0.50    Years: 15.00    Pack years: 7.50    Types: Cigarettes  . Smokeless tobacco: Never Used  . Tobacco comment: quit after hospitized for hip fracture   Vaping Use  . Vaping Use: Never used  Substance Use Topics  . Alcohol use: No    Alcohol/week: 0.0 standard drinks    Comment: quit 6 years ago   .  Drug use: No     Home Medications Prior to Admission medications   Medication Sig Start Date End Date Taking? Authorizing Provider  acetaminophen (TYLENOL) 325 MG tablet Take 650 mg by mouth every 6 (six) hours as needed for mild pain or moderate pain.   Yes [provider]  alendronate (FOSAMAX) 70 MG tablet 1 tablet 30 minutes before the first food, beverage or medicine of the day with plain water 05/19/18  Yes [provider]  amLODipine (NORVASC) 5 MG tablet TAKE 1 TABLET(5 MG) BY MOUTH DAILY 03/18/20  Yes Fayrene Helper, MD  clopidogrel (PLAVIX) 75 MG tablet Take 1 tablet (75 mg total) by mouth daily. 01/28/20 01/27/21 Yes Waynetta Sandy, MD  ezetimibe (ZETIA) 10 MG tablet TAKE 1 TABLET(10 MG)  BY MOUTH DAILY Patient taking differently: Take 10 mg by mouth daily. 01/15/20  Yes Fayrene Helper, MD  fluticasone (FLONASE) 50 MCG/ACT nasal spray SHAKE LIQUID AND USE 1 SPRAY IN EACH NOSTRIL DAILY Patient taking differently: Place 1 spray into both nostrils daily as needed for allergies. 08/23/18  Yes Fayrene Helper, MD  HYDROcodone-acetaminophen (NORCO/VICODIN) 5-325 MG tablet Take 1 tablet by mouth daily as needed for moderate pain. 04/14/20  Yes Persons, Bevely Palmer, [place]  hydroxychloroquine (PLAQUENIL) 200 MG tablet Take 400 mg by mouth daily. 08/14/19  Yes [provider]  metoprolol succinate (TOPROL-XL) 25 MG 24 hr tablet TAKE 1 TABLET(25 MG) BY MOUTH DAILY Patient taking differently: Take 25 mg by mouth daily. 06/18/19  Yes Arnoldo Lenis, MD  montelukast (SINGULAIR) 10 MG tablet TAKE 1 TABLET(10 MG) BY MOUTH AT BEDTIME 03/05/20  Yes Fayrene Helper, MD  Multiple Vitamin (MULTIVITAMIN WITH MINERALS) TABS tablet Take 1 tablet by mouth daily.   Yes [provider]  oxybutynin (DITROPAN) 5 MG tablet TAKE 1/2 TABLET(2.5 MG) BY MOUTH TWICE DAILY Patient taking differently: Take 2.5 mg by mouth 2 (two) times daily. 08/06/19  Yes Fayrene Helper, MD  pantoprazole (PROTONIX) 40 MG tablet TAKE 1 TABLET(40 MG) BY MOUTH DAILY Patient taking differently: Take 40 mg by mouth daily. 12/12/19  Yes Fayrene Helper, MD  potassium chloride SA (KLOR-CON) 20 MEQ tablet Take 20 mEq by mouth daily. 11/04/19  Yes [provider]  rosuvastatin (CRESTOR) 20 MG tablet Take one tablet by mouth every Monday, Wednesday, Friday and Sunday Patient taking differently: Take 20 mg by mouth See admin instructions. Take 1 tablet (20mg ) by mouth every Monday, Wednesday, Friday and Sunday 02/19/19  Yes Branch, Alphonse Guild, MD  torsemide (DEMADEX) 20 MG tablet Take 0.5 tablets (10 mg total) by mouth daily. 12/21/19 03/20/20 Yes [place], Fransisco Hertz, PA-C  UNABLE TO FIND Under pads use as  needed  Pullups use as needed   Length of need- 99 months DX urinary incontinence 01/02/19  Yes Fayrene Helper, MD  UNABLE TO FIND Incontinence briefs and pads Dx incontinence 10/02/19  Yes Fayrene Helper, MD  nicotine (NICODERM CQ - DOSED IN MG/24 HOURS) 14 mg/24hr patch Place 1 patch (14 mg total) onto the skin daily. Patient not taking: Reported on 04/22/2020 10/02/19   Fayrene Helper, MD  sulfamethoxazole-trimethoprim (BACTRIM DS) 800-160 MG tablet Take 1 tablet by mouth 2 (two) times daily. Patient not taking: No sig reported 04/07/20   Persons, Bevely Palmer, [place]     Allergies    Ace inhibitors   Review of Systems   Review of Systems Unable to assess due to mental status.  Physical Exam BP 128/81   Pulse (!) 115   Temp 98.3 F (36.8 C) (Oral)   Resp (!) 32   SpO2 100%   Physical Exam Vitals and nursing note reviewed.  Constitutional:      Appearance: Normal appearance.  HENT:     Head: Normocephalic and atraumatic.     Nose: Nose normal.     Mouth/Throat:     Mouth: Mucous membranes are moist.  Eyes:     Extraocular Movements: Extraocular movements intact.     Conjunctiva/sclera: Conjunctivae normal.  Cardiovascular:     Rate and Rhythm: Normal rate.  Pulmonary:     Effort: Pulmonary effort is normal.     Breath sounds: Normal breath sounds.  Abdominal:     General: Abdomen is flat.     Palpations: Abdomen is soft.     Tenderness: There is no abdominal tenderness.  Musculoskeletal:     Cervical back: Neck supple.     Comments: Dressing taken down, large ulceration on L dorsal foot with purulent drainage, large ulceration on L medial posterior calf with oozing blood, no pulsatile bleeding; see photos below; foot is blacked, faint DP pulse, warm to touch  Skin:    General: Skin is warm and dry.  Neurological:     General: No focal deficit present.     Mental Status: She is alert.  Psychiatric:        Mood and Affect: Mood normal.           ED Results / Procedures / Treatments   Labs (all labs ordered are listed, but only abnormal results are displayed) Labs Reviewed  COMPREHENSIVE METABOLIC PANEL - Abnormal; Notable for the following components:      Result Value   Potassium 2.9 (*)    CO2 21 (*)    Glucose, Bld 153 (*)    Calcium 8.8 (*)    Total Protein 8.2 (*)    Albumin 2.6 (*)    Alkaline Phosphatase 130 (*)    All other components within normal limits  CBC WITH DIFFERENTIAL/PLATELET - Abnormal; Notable for the following components:   WBC 14.9 (*)    RBC 2.97 (*)    Hemoglobin 9.8 (*)    HCT 31.4 (*)    MCV 105.7 (*)    RDW 17.8 (*)    Neutro Abs 8.8 (*)    Lymphs Abs 4.3 (*)    Monocytes Absolute 1.5 (*)    Abs Immature Granulocytes 0.09 (*)    All other components within normal limits  PROTIME-INR - Abnormal; Notable for the following components:   Prothrombin Time 15.3 (*)    INR 1.3 (*)    All other components within normal limits  SEDIMENTATION RATE - Abnormal; Notable for the following components:   Sed Rate 125 (*)    All other components within normal limits  CULTURE, BLOOD (ROUTINE X 2)  CULTURE, BLOOD (ROUTINE X 2)  SARS CORONAVIRUS 2 (TAT 6-24 HRS)    EKG EKG Interpretation  Date/Time:  Tuesday April 22 2020 12:50:08 EST Ventricular Rate:  117 PR Interval:    QRS Duration: 90 QT Interval:  339 QTC Calculation: 473 R Axis:   -10 Text Interpretation: Sinus tachycardia Probable left atrial enlargement Probable LVH with secondary repol abnrm Inferior infarct, old Baseline wander in lead(s) I III aVL Since last tracing Rate faster Confirmed by Calvert Cantor [phone]) on 04/22/2020 1:09:59 PM   Radiology DG Tibia/Fibula Right  Result Date: 04/22/2020 CLINICAL DATA:  Source foot and posterior calf ulcer, question gas or bone involvement EXAM: RIGHT TIBIA AND FIBULA - 2 VIEW COMPARISON:  03/28/2020 FINDINGS: Diffuse soft tissue swelling. Skin ulcer identified at the medial  margin of the distal RIGHT lower leg. Scattered soft tissue gas at the medial soft tissues posteriorly over approximately 12 cm length. Additional small skin ulcer overlying lateral malleolus. No fracture, dislocation, or bone destruction. Tibiotalar fusion with subtalar and additional intertarsal degenerative changes seen. Plantar calcaneal spurring. Small vessel vascular calcifications. IM nail within distal femur. Mild joint space narrowing at medial compartment RIGHT knee. IMPRESSION: Skin ulcer identified at posterior margin of distal RIGHT lower leg medially over 12 cm length. Additional small skin ulcer laterally overlying lateral malleolus. No underlying osseous abnormalities. Tibiotalar fusion with additional intertarsal degenerative changes. Electronically Signed   By: Lavonia Dana M.D.   On: 04/22/2020 14:47   DG Foot Complete Right  Result Date: 04/22/2020 CLINICAL DATA:  Several ulcers on the top and medial aspect of the foot. Ulcers along the posterior lower leg. EXAM: RIGHT FOOT COMPLETE - 3+ VIEW COMPARISON:  03/28/2020 and on 02/12/2020 and CT ankle 12/22/2019 FINDINGS: Soft tissue irregularity along the dorsal aspect of the foot compatible with history of ulcerations. In addition, there is lucency and soft tissue abnormality is along the posterior lower calf region and this is also compatible with history of ulcerations. Diffuse vascular calcifications. Diffuse osteopenia in the ankle and foot. 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 Again noted is abnormal appearance of the MTP joints with marked joint space narrowing at the fourth MTP joint. There appears to be a new fracture along the medial base of the fourth toe proximal phalanx. Difficult to exclude additional fractures involving the proximal phalanx of the fourth toe. New lucency involving the medial base of the little toe proximal phalanx. Again noted is abnormal appearance of the metatarsal heads particularly the third, fourth and  fifth. Appearance of the metatarsal heads raise concern for previous inflammatory changes and erosive changes. There may be chronic lucency or fracture involving the medial base of the third toe proximal phalanx. Evidence for extensive ankylosis involving the tarsal bones. IMPRESSION: 1. New fractures involving the proximal phalanx of the fourth toe and little toe. Possible old fracture involving the proximal phalanx of the third toe. 2. Chronic abnormalities at the MTP joints with remodeling and abnormality of the metatarsal heads. Findings are concerning for underlying inflammatory arthropathy. 3. Soft tissue irregularity along the dorsum of the foot and posterior aspect of the calf. Findings are compatible with known ulcerations. No clear evidence for osteomyelitis at these areas of soft tissue abnormality. 4. 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 Electronically Signed   By: Markus Daft M.D.   On: 04/22/2020 14:57    Procedures Procedures  Medications Ordered in the ED Medications  ceFEPIme (MAXIPIME) 2 g in sodium chloride 0.9 % 100 mL IVPB (has no administration in time range)  vancomycin (VANCOREADY) IVPB 1750 mg/350 mL (has no administration in time range)  vancomycin (VANCOREADY) IVPB 1250 mg/250 mL (has no administration in time range)     MDM Rules/Calculators/A&P MDM Patient with chronic large ulcers on foot and calf. No prior photos available to compare. Not febrile in the ED. Will check labs, xrays and reassess.  ED Course  I have reviewed the triage vital signs and the nursing notes.  Pertinent labs & imaging results that were available during my care of the patient were reviewed by me  and considered in my medical decision making (see chart for details).  Clinical Course as of 04/22/20 1517  Tue Apr 22, 2020  1434 WBC elevated, sed rate elevated, patient initially refusing Jillyn Hidden has now agreed. Will begin IV antibiotics. Anticipate admission. Covid swab added.   [CS]  1435 CMP with mild hypokalemia, otherwise unremarkable.  [CS]  1446 Attempted to contact family member's number listed in chart with no answer. Advised patient that we would plan to admit for further treatment.  [CS]  1515 Spoke with Dr. Arlean Hopping, Hospitalist, who will evaluate for admission.  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

## 2020-04-22 NOTE — ED Notes (Signed)
Lab called and asked to draw blood cultures due to not being able to collect from IV start.

## 2020-04-22 NOTE — ED Notes (Signed)
Wet to dry dressing placed to RLL wounds per Hospitalist. Patient tolerated well.

## 2020-04-22 NOTE — ED Triage Notes (Signed)
Pt from home via [facility]. Pt sent for right lower leg wound bleeding.

## 2020-04-22 NOTE — Progress Notes (Addendum)
Pharmacy Antibiotic Note  Danielle Cruz is a 65 y.o. female admitted on 04/22/2020 with wound infection.  Pharmacy has been consulted for Vancomycin dosing.  Plan: Vancomycin 1750 mg IV x 1 dose Vancomycin 1250 mg IV every 24 hours. Expected AUC 463. Cefepime 2000 mg IV every 8 hours. Monitor labs, c/s, and vanco level as indicated.     Temp (24hrs), Avg:98.3 F (36.8 C), Min:98.3 F (36.8 C), Max:98.3 F (36.8 C)  Recent Labs  Lab 04/22/20 1240  WBC 14.9*  CREATININE 0.76    Estimated Creatinine Clearance: 75 mL/min (by C-G formula based on SCr of 0.76 mg/dL).    Allergies  Allergen Reactions  . Ace Inhibitors Cough    New daily cough since starting ACE    Antimicrobials this admission: Vanco 1/4 >>  Cefepime 1/4 >>   Microbiology results: 1/4 BCx: pending   Thank you for allowing pharmacy to be a part of this patient's care.  Tad Moore 04/22/2020 3:08 PM

## 2020-04-22 NOTE — ED Notes (Signed)
Son in room, patient agreeable to x-ray with him present.

## 2020-04-22 NOTE — ED Notes (Signed)
Patient brought back to room from x-ray after refusing to allow staff to complete x-ray.

## 2020-04-22 NOTE — ED Notes (Signed)
Patient to xray at this time

## 2020-04-22 NOTE — ED Notes (Signed)
Hospitalist aware of only having one blood culture and states one is enough. Will D/C order.

## 2020-04-22 NOTE — H&P (Signed)
[facility] History & Physical  Danielle Splinter MD  Danielle Cruz 04/22/2020  Referring MD: Dr. Karle Cruz, ED  Chief Complaint: R leg wound bleeding HPI: The patient is a 65 y.o. year-old w/ hx of CAD sp stents, CHF, DMD, h/o CVA, HTN, HL, RA and known PVD, sp RLE revasc procedure w/ hx chronic leg wound f/b Dr Danielle Cruz with wound care and unna wraps.  Has been getting abx, Bactrim. Last ortho visit was on 12/27. Today began to have bleeding through her dressing and EMS was called. Brought to ED for eval. In ED exam showed severe chronic ulcer of RLE w/ fat layer exposed and also significant gangrene of the R foot and toes. WBC ^, ESR 125. Xray were done of the tib/ fib and R foot, IV abx Cruz w/ cefepime and vancomycin. Asked to see for admission.      Pt seen in ED, she is aphasia but understands what I am saying, replies appropriately to yes/ no questions. Main c/o is pain in R leg/ foot, no CP, no SOB, no cough or abd pain, no n/v/d.    Admit Sept 2021 for R ankle cellulitis and ankle wound, rx'd broad spec IV abx w/ improved cellulitis. CT scan w/o drainable collection. DC'd on po doxy, dc'd home. Had mild AKI also, hemiplegia sp prior CVA, rheumatoid arthritis on plaquenil and HTN.   ROS  denies CP  no joint pain   no HA  no blurry vision  no rash  no diarrhea  no nausea/ vomiting  no dysuria  no difficulty voiding  no change in urine color   Past Medical History  Past Medical History:  Diagnosis Date  . AKI (acute kidney injury) ([place]) 09/22/2017  . Anemia   . 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 Arthritis   . CHF (congestive heart failure) ([place]) 10/02/2019  . Colonic polyp 2010   Hemorrhoids; h/o mild hematochezia  . CVA (cerebral infarction) 08/2008   sizable left -residual expressive aphasia, right sided weakness; ambulates with difficulty with the right leg brace   . Hyperlipidemia   .  Hypertension 2005  . Osteoporosis 03/28/2020  . Rheumatoid arthritis(714.0)   . Stroke [facility])    Past Surgical History  Past Surgical History:  Procedure Laterality Date  . ABDOMINAL AORTOGRAM W/LOWER EXTREMITY Bilateral 01/28/2020   Procedure: ABDOMINAL AORTOGRAM W/LOWER EXTREMITY;  Surgeon: Danielle Sandy, MD;  Location: [place] [facility];  Service: Cardiovascular;  Laterality: Bilateral;  . ANKLE SURGERY     Right  . CAROTID STENT INSERTION    . COLONOSCOPY W/ POLYPECTOMY  11/2008   Dr. Gala Cruz. Left-sided diverticula, Pedunculated polyp snared (no adenomatous changes)  . COLONOSCOPY WITH PROPOFOL N/A 01/12/2018   Procedure: COLONOSCOPY WITH PROPOFOL;  Surgeon: Danielle Dolin, MD;  [facility];  Service: Endoscopy;  Laterality: N/A;  . FEMUR IM NAIL Right 09/23/2017   Procedure: INTRAMEDULLARY (IM) NAIL FEMORAL;  Surgeon: Danielle Butters, MD;  Location: [place];  Service: Orthopedics;  Laterality: Right;  . FOOT SURGERY Right 01/31/2020  . FRACTURE SURGERY     Hip replacement in May 2019 - fall related   . OOPHORECTOMY    . PERIPHERAL VASCULAR BALLOON ANGIOPLASTY Right 01/28/2020   Procedure: PERIPHERAL VASCULAR BALLOON ANGIOPLASTY;  Surgeon: Danielle Sandy, MD;  Location: [place] [facility];  Service: Cardiovascular;  Laterality: Right;  SFA  Family History  Family History  Problem Relation Age of Onset  . Cancer Father        prostate, deceased 84s  . Breast cancer Sister        Deceased 64s  . Heart attack Mother        deceased 16, during childbirth  . Cancer Brother        lymphoma  . Colon cancer Maternal Aunt        older than age 6  . Liver disease Neg Hx    Social History  reports that she has been smoking cigarettes. She has a 7.50 pack-year smoking history. She has never used smokeless tobacco. She reports that she does not drink alcohol and does not use drugs. Allergies  Allergies  Allergen Reactions  . Ace Inhibitors Cough     New daily cough since starting ACE   Home medications Prior to Admission medications   Medication Sig Start Date End Date Taking? Authorizing Provider  acetaminophen (TYLENOL) 325 MG tablet Take 650 mg by mouth every 6 (six) hours as needed for mild pain or moderate pain.   Yes [provider]  alendronate (FOSAMAX) 70 MG tablet 1 tablet 30 minutes before the first food, beverage or medicine of the day with plain water 05/19/18  Yes [provider]  amLODipine (NORVASC) 5 MG tablet TAKE 1 TABLET(5 MG) BY MOUTH DAILY 03/18/20  Yes Danielle Helper, MD  clopidogrel (PLAVIX) 75 MG tablet Take 1 tablet (75 mg total) by mouth daily. 01/28/20 01/27/21 Yes Danielle Sandy, MD  ezetimibe (ZETIA) 10 MG tablet TAKE 1 TABLET(10 MG) BY MOUTH DAILY Patient taking differently: Take 10 mg by mouth daily. 01/15/20  Yes Danielle Helper, MD  fluticasone (FLONASE) 50 MCG/ACT nasal spray SHAKE LIQUID AND USE 1 SPRAY IN EACH NOSTRIL DAILY Patient taking differently: Place 1 spray into both nostrils daily as needed for allergies. 08/23/18  Yes Danielle Helper, MD  HYDROcodone-acetaminophen (NORCO/VICODIN) 5-325 MG tablet Take 1 tablet by mouth daily as needed for moderate pain. 04/14/20  Yes Danielle Cruz, Danielle Cruz, [place]  hydroxychloroquine (PLAQUENIL) 200 MG tablet Take 400 mg by mouth daily. 08/14/19  Yes [provider]  metoprolol succinate (TOPROL-XL) 25 MG 24 hr tablet TAKE 1 TABLET(25 MG) BY MOUTH DAILY Patient taking differently: Take 25 mg by mouth daily. 06/18/19  Yes Danielle Lenis, MD  montelukast (SINGULAIR) 10 MG tablet TAKE 1 TABLET(10 MG) BY MOUTH AT BEDTIME 03/05/20  Yes Danielle Helper, MD  Multiple Vitamin (MULTIVITAMIN WITH MINERALS) TABS tablet Take 1 tablet by mouth daily.   Yes [provider]  oxybutynin (DITROPAN) 5 MG tablet TAKE 1/2 TABLET(2.5 MG) BY MOUTH TWICE DAILY Patient taking differently: Take 2.5 mg by mouth 2 (two) times daily.  08/06/19  Yes Danielle Helper, MD  pantoprazole (PROTONIX) 40 MG tablet TAKE 1 TABLET(40 MG) BY MOUTH DAILY Patient taking differently: Take 40 mg by mouth daily. 12/12/19  Yes Danielle Helper, MD  potassium chloride SA (KLOR-CON) 20 MEQ tablet Take 20 mEq by mouth daily. 11/04/19  Yes [provider]  rosuvastatin (CRESTOR) 20 MG tablet Take one tablet by mouth every Monday, Wednesday, Friday and Sunday Patient taking differently: Take 20 mg by mouth See admin instructions. Take 1 tablet (3m) by mouth every Monday, Wednesday, Friday and Sunday 02/19/19  Yes Branch, JAlphonse Guild MD  torsemide (DEMADEX) 20 MG tablet Take 0.5 tablets (10 mg total) by mouth daily. 12/21/19 03/20/20 Yes Strader,  [place], PA-C  UNABLE TO FIND Under pads use as needed  Pullups use as needed   Length of need- 99 months DX urinary incontinence 01/02/19  Yes Danielle Helper, MD  UNABLE TO FIND Incontinence briefs and pads Dx incontinence 10/02/19  Yes Danielle Helper, MD  nicotine (NICODERM CQ - DOSED IN MG/24 HOURS) 14 mg/24hr patch Place 1 patch (14 mg total) onto the skin daily. Patient not taking: Reported on 04/22/2020 10/02/19   Danielle Helper, MD  sulfamethoxazole-trimethoprim (BACTRIM DS) 800-160 MG tablet Take 1 tablet by mouth 2 (two) times daily. Patient not taking: No sig reported 04/07/20   Danielle Cruz, Danielle Cruz, [place]       Exam Gen alert, no distress, pleasant aphasicAAF sitting up on stretcher No rash, cyanosis or gangrene Sclera anicteric, throat clear  No jvd or bruits Chest clear bilat to bases RRR no MRG Abd soft ntnd no mass or ascites +bs GU defer MS no joint effusions or deformity Ext R lower leg w/ blackening of skin of distal R foot, large open wound on dorsal foot 5x 8 cm, another wound on R ant lower leg, no abscess noted, chronic deformities of both feet, and hands due to RA most likely. Trace edema bilat pretib.  Neuro is alert, Ox 3, R hemiparesis and severe  expressive aphasia    Home meds:  - norvasc 5/ metoprolol xl 25/ demadex 10 qd/ kdur 20 qd  - plavix 75/ zetia 10   - protonix 40/ fosamax 70 mg  - plaquenil 400qd  - bactrim bid / nicotine patch/ ditropan bid/ singulair 10 hx/ norco prn  - prn's/ vitamins/ supplements    Temp 98.3  RR 31 > 20  HR 115, 128 > 85 ,  BP 150/75 , 100% RA    In ED IV cefepime 2gm, IV vanc 1.75 gm    Na 139  K 2.9  CO2 21  BUN 9  Cr 0.76  Ca 8.8  Alb 2.6  LFT"s ok    wBC 14.9K HB 9.8 mcv 105   ESR 125  INR 1.3  Glu 153       COVID pending       R foot xray - IMPRESSION: 1. New fractures involving the proximal phalanx of the fourth toe and little toe. Possible old fracture involving the proximal phalanx of the third toe. 2. Chronic abnormalities at the MTP joints with remodeling and abnormality of the metatarsal heads. Findings are concerning for underlying inflammatory arthropathy. 3. Soft tissue irregularity along the dorsum of the foot and posterior aspect of the calf. Findings are compatible with known ulcerations. No clear evidence for osteomyelitis at these areas of soft tissue abnormality. 4. 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       R tib/fib xray - IMPRESSION: Skin ulcer identified at posterior margin of distal RIGHT lower leg medially over 12 cm length. Additional small skin ulcer laterally overlying lateral malleolus. No underlying osseous abnormalities. Tibiotalar fusion with additional intertarsal degenerative changes.   Assessment/ Plan: 1. 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 Pt is not septic fortunately. Will continue IV abx started in ED, consult ortho, admit to medical bed. Pt is normally f/b Dr Danielle Cruz in [place]. Pain meds.  2. H/o RA - severe changes in feet and hands 3. HTN - cont meds 4. HL - cont meds 5. CAD hx of stents 6. Chron diast CHF : last echo Daniya  2021, EF 60-65%, G1DD. Euvolemic on exam, cont low dose demadex.        Danielle Splinter  MD 04/22/2020, 4:15 PM

## 2020-04-22 NOTE — ED Notes (Signed)
This nurse and CN Michael into patient room to draw labs ordered. Patient refusing at this time. Dr. Arlean Hopping aware and states it was part of order set and not needed.

## 2020-04-22 NOTE — ED Notes (Signed)
One set of blood cultures obtained. Patient difficult stick and unable to obtain second set despite multiple attempts by lab and nursing staff. Patient refusing to allow [place] to draw anymore labs at this time. Noted to be easily agitated and tries to convey message without success which further upsets her. When told that she would be admitted, patient attempted to this this Clinical research associate. Noted to fling arms about in bed. IV secured with kling. Son in room to calm patient.

## 2020-04-23 ENCOUNTER — Telehealth: Payer: Self-pay | Admitting: Orthopedic Surgery

## 2020-04-23 ENCOUNTER — Inpatient Hospital Stay (HOSPITAL_COMMUNITY): Payer: Medicare Other

## 2020-04-23 ENCOUNTER — Encounter: Payer: Self-pay | Admitting: Orthopedic Surgery

## 2020-04-23 DIAGNOSIS — L97218 Non-pressure chronic ulcer of right calf with other specified severity: Secondary | ICD-10-CM | POA: Diagnosis not present

## 2020-04-23 DIAGNOSIS — L03115 Cellulitis of right lower limb: Secondary | ICD-10-CM | POA: Diagnosis not present

## 2020-04-23 DIAGNOSIS — T8149XA Infection following a procedure, other surgical site, initial encounter: Secondary | ICD-10-CM | POA: Diagnosis not present

## 2020-04-23 LAB — SARS CORONAVIRUS 2 (TAT 6-24 HRS): SARS Coronavirus 2: NEGATIVE

## 2020-04-23 MED ORDER — SODIUM CHLORIDE 0.9 % IV SOLN: INTRAVENOUS | Status: DC | PRN

## 2020-04-23 MED ORDER — SULFAMETHOXAZOLE-TRIMETHOPRIM 800-160 MG PO TABS: 1.0000 | ORAL_TABLET | Freq: Two times a day (BID) | ORAL | 0 refills | Status: DC

## 2020-04-23 MED ORDER — HYDROCODONE-ACETAMINOPHEN 5-325 MG PO TABS
1.0000 | ORAL_TABLET | Freq: Four times a day (QID) | ORAL | Status: DC | PRN
  Administered 2020-04-23: 1 via ORAL
  Filled 2020-04-23: qty 1

## 2020-04-23 MED ORDER — POTASSIUM CHLORIDE CRYS ER 20 MEQ PO TBCR: 40.0000 meq | EXTENDED_RELEASE_TABLET | Freq: Every day | ORAL | 0 refills | Status: DC

## 2020-04-23 NOTE — Telephone Encounter (Signed)
Pt was admitted to hospital last night. States that she went because her veins bleed out? Please give her a call [phone]

## 2020-04-23 NOTE — Progress Notes (Signed)
Nsg Discharge Note  Admit Date:  04/22/2020 Discharge date: 04/23/2020   Danielle Cruz to be D/C'd Danielle Cruz  per MD order.  AVS completed.  Patient able to verbalize understanding.  Discharge Medication: Allergies as of 04/23/2020      Reactions   Ace Inhibitors Cough   New daily cough since starting ACE      Medication List    TAKE these medications   acetaminophen 325 MG tablet Commonly known as: TYLENOL Take 650 mg by mouth every 6 (six) hours as needed for mild pain or moderate pain.   alendronate 70 MG tablet Commonly known as: FOSAMAX 1 tablet 30 minutes before the first food, beverage or medicine of the day with plain water   amLODipine 5 MG tablet Commonly known as: NORVASC TAKE 1 TABLET(5 MG) BY MOUTH DAILY   clopidogrel 75 MG tablet Commonly known as: Plavix Take 1 tablet (75 mg total) by mouth daily.   ezetimibe 10 MG tablet Commonly known as: ZETIA TAKE 1 TABLET(10 MG) BY MOUTH DAILY What changed: See the new instructions.   fluticasone 50 MCG/ACT nasal spray Commonly known as: FLONASE SHAKE LIQUID AND USE 1 SPRAY IN EACH NOSTRIL DAILY What changed: See the new instructions.   HYDROcodone-acetaminophen 5-325 MG tablet Commonly known as: NORCO/VICODIN Take 1 tablet by mouth daily as needed for moderate pain.   hydroxychloroquine 200 MG tablet Commonly known as: PLAQUENIL Take 400 mg by mouth daily.   metoprolol succinate 25 MG 24 hr tablet Commonly known as: TOPROL-XL TAKE 1 TABLET(25 MG) BY MOUTH DAILY What changed: See the new instructions.   montelukast 10 MG tablet Commonly known as: SINGULAIR TAKE 1 TABLET(10 MG) BY MOUTH AT BEDTIME   multivitamin with minerals Tabs tablet Take 1 tablet by mouth daily.   nicotine 14 mg/24hr patch Commonly known as: NICODERM CQ - dosed in mg/24 hours Place 1 patch (14 mg total) onto the skin daily.   oxybutynin 5 MG tablet Commonly known as: DITROPAN TAKE 1/2 TABLET(2.5 MG) BY MOUTH TWICE DAILY What  changed: See the new instructions.   pantoprazole 40 MG tablet Commonly known as: PROTONIX TAKE 1 TABLET(40 MG) BY MOUTH DAILY What changed: See the new instructions.   potassium chloride SA 20 MEQ tablet Commonly known as: KLOR-CON Take 2 tablets (40 mEq total) by mouth daily. What changed: how much to take   rosuvastatin 20 MG tablet Commonly known as: Crestor Take one tablet by mouth every Monday, Wednesday, Friday and Sunday What changed:   how much to take  how to take this  when to take this  additional instructions   sulfamethoxazole-trimethoprim 800-160 MG tablet Commonly known as: BACTRIM DS Take 1 tablet by mouth 2 (two) times daily.   torsemide 20 MG tablet Commonly known as: DEMADEX Take 0.5 tablets (10 mg total) by mouth daily.   UNABLE TO FIND Under pads use as needed  Pullups use as needed   Length of need- 99 months DX urinary incontinence   UNABLE TO FIND Incontinence briefs and pads Dx incontinence       Discharge Assessment: Vitals:   04/23/20 0605 04/23/20 1412  BP: (!) 120/54 (!) 155/64  Pulse: 79 88  Resp: 20 (!) 22  Temp: 98.1 F (36.7 C) 98.3 F (36.8 C)  SpO2: 100% 100%   Skin clean, dry and intact without evidence of skin break down, no evidence of skin tears noted. IV catheter discontinued intact. Site without signs and symptoms of complications -  no redness or edema noted at insertion site, patient denies c/o pain - only slight tenderness at site.  Dressing with slight pressure applied.  D/c Instructions-Education: Discharge instructions given to patient with verbalized understanding. D/c education completed with patient including follow up instructions, medication list, d/c activities limitations if indicated, with other d/c instructions as indicated by MD - patient able to verbalize understanding, all questions fully answered. Patient instructed to return to ED, call 911, or call MD for any changes in condition.  Patient  escorted via WC, and D/C home via private auto.  Jethro Poling, RN 04/23/2020 6:53 PM

## 2020-04-23 NOTE — Discharge Summary (Signed)
Physician Discharge Summary  Trenesha C Ribas [id number] DOB: 18-Feb-1956 DOA: 04/22/2020  PCP: Fayrene Helper, MD  Admit date: 04/22/2020 Discharge date: 04/23/2020  Admitted From: Home  Disposition:  Home   Recommendations for Outpatient Follow-up:  1. Follow up with Dr. Sharol Given tomorrow 2. Please repeat K in 1 week     Home Health: None  Equipment/Devices: None new  Discharge Condition: Fair  CODE STATUS: FULL Diet recommendation: Cardiac  Brief/Interim Summary: Mrs. Lotti is a 65 y.o. F with hx CVA, dense aphasia, CAD s/p remote PCI, dCHF, HTN, TA and PVD s/p revascularization October, but still with chronic wounds of the right foot, followed by Dr. Sharol Given who presents with bleeding.  Patient had excessive bleeding onto her right foot dressings, so EMS were activated.  In the ER, the patient was tachycardic, found to have elevated white blood cell count.  Started on antibiotics and admitted for cellulitis.         PRINCIPAL HOSPITAL DIAGNOSIS: Cellulitis of chronic wound    Discharge Diagnoses:   Cellulitis of chronic wound The patient was treated with broad-spectrum antibiotics.  Radiograph of the right foot and tib-fib showed new fractures of the proximal fourth and fifth toes, chronic Charcot arthropathy, no definite osteomyelitis.  Orthopedics Dr. Aline Brochure was consulted who evaluated the patient and felt that the foot likely require an amputation.  The patient's heart rate normalized, her appetite was good, she had no fever, and she was mentating at her baseline.    The case was discussed with the patient's primary orthopedist who noted the patient has an appointment tomorrow with Dr. Sharol Given.  She was discharged to complete 10 days of Bactrim and follow up closely with her primary team.   Hypokalemia Potassium low on admission.  This was repleted.  She was discharged on oral potassium, and should have a repeat potassium within 1 week.         Discharge  Instructions  Discharge Instructions    Diet - low sodium heart healthy   Complete by: As directed    Discharge instructions   Complete by: As directed    From Dr. Loleta Books: Pick up potassium and Bactrim tonight Take potassium 40 mEq tonight, then once daily for the next week, then have your potassium rechecked before resuming  Take Bactrim DS 1 tablet twice daily starting tonight  Go see Dr. Sharol Given tomorrow   Increase activity slowly   Complete by: As directed      Allergies as of 04/23/2020      Reactions   Ace Inhibitors Cough   New daily cough since starting ACE      Medication List    TAKE these medications   acetaminophen 325 MG tablet Commonly known as: TYLENOL Take 650 mg by mouth every 6 (six) hours as needed for mild pain or moderate pain.   alendronate 70 MG tablet Commonly known as: FOSAMAX 1 tablet 30 minutes before the first food, beverage or medicine of the day with plain water   amLODipine 5 MG tablet Commonly known as: NORVASC TAKE 1 TABLET(5 MG) BY MOUTH DAILY   clopidogrel 75 MG tablet Commonly known as: Plavix Take 1 tablet (75 mg total) by mouth daily.   ezetimibe 10 MG tablet Commonly known as: ZETIA TAKE 1 TABLET(10 MG) BY MOUTH DAILY What changed: See the new instructions.   fluticasone 50 MCG/ACT nasal spray Commonly known as: FLONASE SHAKE LIQUID AND USE 1 SPRAY IN EACH NOSTRIL DAILY What changed: See the  new instructions.   HYDROcodone-acetaminophen 5-325 MG tablet Commonly known as: NORCO/VICODIN Take 1 tablet by mouth daily as needed for moderate pain.   hydroxychloroquine 200 MG tablet Commonly known as: PLAQUENIL Take 400 mg by mouth daily.   metoprolol succinate 25 MG 24 hr tablet Commonly known as: TOPROL-XL TAKE 1 TABLET(25 MG) BY MOUTH DAILY What changed: See the new instructions.   montelukast 10 MG tablet Commonly known as: SINGULAIR TAKE 1 TABLET(10 MG) BY MOUTH AT BEDTIME   multivitamin with minerals Tabs  tablet Take 1 tablet by mouth daily.   nicotine 14 mg/24hr patch Commonly known as: NICODERM CQ - dosed in mg/24 hours Place 1 patch (14 mg total) onto the skin daily.   oxybutynin 5 MG tablet Commonly known as: DITROPAN TAKE 1/2 TABLET(2.5 MG) BY MOUTH TWICE DAILY What changed: See the new instructions.   pantoprazole 40 MG tablet Commonly known as: PROTONIX TAKE 1 TABLET(40 MG) BY MOUTH DAILY What changed: See the new instructions.   potassium chloride SA 20 MEQ tablet Commonly known as: KLOR-CON Take 2 tablets (40 mEq total) by mouth daily. What changed: how much to take   rosuvastatin 20 MG tablet Commonly known as: Crestor Take one tablet by mouth every Monday, Wednesday, Friday and Sunday What changed:   how much to take  how to take this  when to take this  additional instructions   sulfamethoxazole-trimethoprim 800-160 MG tablet Commonly known as: BACTRIM DS Take 1 tablet by mouth 2 (two) times daily.   torsemide 20 MG tablet Commonly known as: DEMADEX Take 0.5 tablets (10 mg total) by mouth daily.   UNABLE TO FIND Under pads use as needed  Pullups use as needed   Length of need- 99 months DX urinary incontinence   UNABLE TO FIND Incontinence briefs and pads Dx incontinence       Follow-up Information    [name], MD. Go in 1 day(s).   Specialty: Orthopedic Surgery Contact information: [address] [place] [address] [phone]              Allergies  Allergen Reactions  . Ace Inhibitors Cough    New daily cough since starting ACE    Consultations:  Orthopedics   Procedures/Studies: DG Tibia/Fibula Right  Result Date: 04/22/2020 CLINICAL DATA:  Source foot and posterior calf ulcer, question gas or bone involvement EXAM: RIGHT TIBIA AND FIBULA - 2 VIEW COMPARISON:  03/28/2020 FINDINGS: Diffuse soft tissue swelling. Skin ulcer identified at the medial margin of the distal RIGHT lower leg. Scattered soft tissue gas  at the medial soft tissues posteriorly over approximately 12 cm length. Additional small skin ulcer overlying lateral malleolus. No fracture, dislocation, or bone destruction. Tibiotalar fusion with subtalar and additional intertarsal degenerative changes seen. Plantar calcaneal spurring. Small vessel vascular calcifications. IM nail within distal femur. Mild joint space narrowing at medial compartment RIGHT knee. IMPRESSION: Skin ulcer identified at posterior margin of distal RIGHT lower leg medially over 12 cm length. Additional small skin ulcer laterally overlying lateral malleolus. No underlying osseous abnormalities. Tibiotalar fusion with additional intertarsal degenerative changes. Electronically Signed   By: Lavonia Dana M.D.   On: 04/22/2020 14:47   DG Tibia/Fibula Right  Result Date: 03/28/2020 CLINICAL DATA:  Swelling, infection EXAM: RIGHT TIBIA AND FIBULA - 2 VIEW COMPARISON:  12/20/2019 FINDINGS: Only an AP view is provided. Per notes, unable to get lateral view due to combative patient. Extensive soft tissue edema. Probable ulcer along the medial aspect  of the distal lower leg. No fracture or obvious bone destruction is evident. Fusion at the ankle joint. IMPRESSION: Soft tissue swelling without definitive acute osseous abnormality on limited single view. Suspected soft tissue ulcer along the medial aspect of the distal lower leg Electronically Signed   By: Jasmine Pang M.D.   On: 03/28/2020 16:12   DG Foot Complete Right  Result Date: 04/22/2020 CLINICAL DATA:  Several ulcers on the top and medial aspect of the foot. Ulcers along the posterior lower leg. EXAM: RIGHT FOOT COMPLETE - 3+ VIEW COMPARISON:  03/28/2020 and on 02/12/2020 and CT ankle 12/22/2019 FINDINGS: Soft tissue irregularity along the dorsal aspect of the foot compatible with history of ulcerations. In addition, there is lucency and soft tissue abnormality is along the posterior lower calf region and this is also compatible with  history of ulcerations. Diffuse vascular calcifications. Diffuse osteopenia in the ankle and foot. 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 Again noted is abnormal appearance of the MTP joints with marked joint space narrowing at the fourth MTP joint. There appears to be a new fracture along the medial base of the fourth toe proximal phalanx. Difficult to exclude additional fractures involving the proximal phalanx of the fourth toe. New lucency involving the medial base of the little toe proximal phalanx. Again noted is abnormal appearance of the metatarsal heads particularly the third, fourth and fifth. Appearance of the metatarsal heads raise concern for previous inflammatory changes and erosive changes. There may be chronic lucency or fracture involving the medial base of the third toe proximal phalanx. Evidence for extensive ankylosis involving the tarsal bones. IMPRESSION: 1. New fractures involving the proximal phalanx of the fourth toe and little toe. Possible old fracture involving the proximal phalanx of the third toe. 2. Chronic abnormalities at the MTP joints with remodeling and abnormality of the metatarsal heads. Findings are concerning for underlying inflammatory arthropathy. 3. Soft tissue irregularity along the dorsum of the foot and posterior aspect of the calf. Findings are compatible with known ulcerations. No clear evidence for osteomyelitis at these areas of soft tissue abnormality. 4. 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 Electronically Signed   By: Richarda Overlie M.D.   On: 04/22/2020 14:57   DG Foot Complete Right  Result Date: 03/28/2020 CLINICAL DATA:  Right lower extremity swelling. EXAM: RIGHT FOOT COMPLETE - 3+ VIEW COMPARISON:  None. FINDINGS: There is no evidence of fracture or dislocation. Diffuse osteopenia is noted. Moderate to marked severity degenerative changes are seen, predominantly involving the proximal and mid right foot. Moderate to marked severity  diffuse soft tissue swelling is seen. A mild amount of soft tissue air is also noted along the dorsal aspect of the mid to distal right foot. IMPRESSION: 1. Degenerative changes and diffuse soft tissue swelling without evidence of acute osteomyelitis. Electronically Signed   By: Aram Candela M.D.   On: 03/28/2020 16:12       Subjective: Feeling well.  Pain with movement of the foot.  No further bleeding.  No fever.  No vomiting.  Discharge Exam: Vitals:   04/23/20 0605 04/23/20 1412  BP: (!) 120/54 (!) 155/64  Pulse: 79 88  Resp: 20 (!) 22  Temp: 98.1 F (36.7 C) 98.3 F (36.8 C)  SpO2: 100% 100%   Vitals:   04/22/20 2202 04/23/20 0143 04/23/20 0605 04/23/20 1412  BP: (!) 149/66 (!) 126/51 (!) 120/54 (!) 155/64  Pulse: 75 80 79 88  Resp: 17 20 20  (!)  22  Temp: (!) 97.5 F (36.4 C) 98 F (36.7 C) 98.1 F (36.7 C) 98.3 F (36.8 C)  TempSrc: Oral Oral Oral Oral  SpO2: 100% 100% 100% 100%  Weight:   68.8 kg     General: Pt is alert, awake, not in acute distress Cardiovascular: RRR, nl S1-S2, no murmurs appreciated.   No LE edema.   Respiratory: Normal respiratory rate and rhythm.  CTAB without rales or wheezes. Abdominal: Abdomen soft and non-tender.  No distension or HSM.   Neuro/Psych: Aphasic.  Interactive.  Moves upper extremities normally, the right leg movement is limited by pain. Judgment and insight appear normal.   The results of significant diagnostics from this hospitalization (including imaging, microbiology, ancillary and laboratory) are listed below for reference.     Microbiology: Recent Results (from the past 240 hour(s))  SARS CORONAVIRUS 2 (TAT 6-24 HRS) Nasopharyngeal Nasopharyngeal Swab     Status: None   Collection Time: 04/22/20  3:04 PM   Specimen: Nasopharyngeal Swab  Result Value Ref Range Status   SARS Coronavirus 2 NEGATIVE NEGATIVE Final    Comment: (NOTE) SARS-CoV-2 target nucleic acids are NOT DETECTED.  The SARS-CoV-2 RNA is  generally detectable in upper and lower respiratory specimens during the acute phase of infection. Negative results do not preclude SARS-CoV-2 infection, do not rule out co-infections with other pathogens, and should not be used as the sole basis for treatment or other patient management decisions. Negative results must be combined with clinical observations, patient history, and epidemiological information. The expected result is Negative.  Fact Sheet for Patients: SugarRoll.be  Fact Sheet for Healthcare Providers: https://www.woods-mathews.com/  This test is not yet approved or cleared by the [place] FDA and  has been authorized for detection and/or diagnosis of SARS-CoV-2 by FDA under an Emergency Use Authorization (EUA). This EUA will remain  in effect (meaning this test can be used) for the duration of the COVID-19 declaration under Se ction 564(b)(1) of the Act, 21 U.S.C. section 360bbb-3(b)(1), unless the authorization is terminated or revoked sooner.  Performed at [facility], [place] [address] [place], [place] [address]   Culture, blood (routine x 2)     Status: None (Preliminary result)   Collection Time: 04/22/20  3:22 PM   Specimen: BLOOD LEFT HAND  Result Value Ref Range Status   Specimen Description BLOOD LEFT HAND  Final   Special Requests   Final    BOTTLES DRAWN AEROBIC ONLY Blood Culture results may not be optimal due to an inadequate volume of blood received in culture bottles   Culture   Final    NO GROWTH < 24 HOURS Performed at [facility], [address], [place], [place] [address]    Report Status PENDING  Incomplete     Labs: BNP (last 3 results) Recent Labs    10/01/19 1515  BNP 99991111   Basic Metabolic Panel: Recent Labs  Lab 04/22/20 1240  NA 139  K 2.9*  CL 106  CO2 21*  GLUCOSE 153*  BUN 9  CREATININE 0.76  CALCIUM 8.8*   Liver Function Tests: Recent Labs  Lab 04/22/20 1240   AST 36  ALT 37  ALKPHOS 130*  BILITOT 0.3  PROT 8.2*  ALBUMIN 2.6*   No results for input(s): LIPASE, AMYLASE in the last 168 hours. No results for input(s): AMMONIA in the last 168 hours. CBC: Recent Labs  Lab 04/22/20 1240  WBC 14.9*  NEUTROABS 8.8*  HGB 9.8*  HCT 31.4*  MCV 105.7*  PLT 342   Cardiac Enzymes: No results for input(s): CKTOTAL, CKMB, CKMBINDEX, TROPONINI in the last 168 hours. BNP: Invalid input(s): POCBNP CBG: No results for input(s): GLUCAP in the last 168 hours. D-Dimer No results for input(s): DDIMER in the last 72 hours. Hgb A1c No results for input(s): HGBA1C in the last 72 hours. Lipid Profile No results for input(s): CHOL, HDL, LDLCALC, TRIG, CHOLHDL, LDLDIRECT in the last 72 hours. Thyroid function studies No results for input(s): TSH, T4TOTAL, T3FREE, THYROIDAB in the last 72 hours.  Invalid input(s): FREET3 Anemia work up No results for input(s): VITAMINB12, FOLATE, FERRITIN, TIBC, IRON, RETICCTPCT in the last 72 hours. Urinalysis    Component Value Date/Time   COLORURINE YELLOW 04/28/2009 0825   APPEARANCEUR CLEAR 04/28/2009 0825   LABSPEC 1.020 04/28/2009 0825   PHURINE 6.5 04/28/2009 0825   GLUCOSEU NEGATIVE 04/28/2009 0825   HGBUR NEGATIVE 04/28/2009 0825   BILIRUBINUR NEGATIVE 04/28/2009 0825   KETONESUR NEGATIVE 04/28/2009 0825   PROTEINUR NEGATIVE 04/28/2009 0825   UROBILINOGEN 0.2 04/28/2009 0825   NITRITE NEGATIVE 04/28/2009 0825   LEUKOCYTESUR  04/28/2009 0825    NEGATIVE MICROSCOPIC NOT DONE ON URINES WITH NEGATIVE PROTEIN, BLOOD, LEUKOCYTES, NITRITE, OR GLUCOSE <1000 mg/dL.   Sepsis Labs Invalid input(s): PROCALCITONIN,  WBC,  LACTICIDVEN Microbiology Recent Results (from the past 240 hour(s))  SARS CORONAVIRUS 2 (TAT 6-24 HRS) Nasopharyngeal Nasopharyngeal Swab     Status: None   Collection Time: 04/22/20  3:04 PM   Specimen: Nasopharyngeal Swab  Result Value Ref Range Status   SARS Coronavirus 2 NEGATIVE  NEGATIVE Final    Comment: (NOTE) SARS-CoV-2 target nucleic acids are NOT DETECTED.  The SARS-CoV-2 RNA is generally detectable in upper and lower respiratory specimens during the acute phase of infection. Negative results do not preclude SARS-CoV-2 infection, do not rule out co-infections with other pathogens, and should not be used as the sole basis for treatment or other patient management decisions. Negative results must be combined with clinical observations, patient history, and epidemiological information. The expected result is Negative.  Fact Sheet for Patients: SugarRoll.be  Fact Sheet for Healthcare Providers: https://www.woods-mathews.com/  This test is not yet approved or cleared by the [place] FDA and  has been authorized for detection and/or diagnosis of SARS-CoV-2 by FDA under an Emergency Use Authorization (EUA). This EUA will remain  in effect (meaning this test can be used) for the duration of the COVID-19 declaration under Se ction 564(b)(1) of the Act, 21 U.S.C. section 360bbb-3(b)(1), unless the authorization is terminated or revoked sooner.  Performed at [facility], [place] [address], [place], [place] [address]   Culture, blood (routine x 2)     Status: None (Preliminary result)   Collection Time: 04/22/20  3:22 PM   Specimen: BLOOD LEFT HAND  Result Value Ref Range Status   Specimen Description BLOOD LEFT HAND  Final   Special Requests   Final    BOTTLES DRAWN AEROBIC ONLY Blood Culture results may not be optimal due to an inadequate volume of blood received in culture bottles   Culture   Final    NO GROWTH < 24 HOURS Performed at [facility], [address], [place], [place] [address]    Report Status PENDING  Incomplete     Time coordinating discharge: 40 minutes The [place] controlled substances registry was reviewed for this patient      SIGNED:   Edwin Dada, MD  [facility] 04/23/2020, 8:03 PM

## 2020-04-23 NOTE — Evaluation (Addendum)
Physical Therapy Evaluation Patient Details Name: Danielle Cruz MRN: [id number] DOB: 1956/01/14 Today's Date: 04/23/2020   History of Present Illness  Danielle Cruz is a 65 year old female with medical history of AKI, anemia, CHF, CVA with residual expressive aphasia and R weakness, HTN, osteoporosis and rheumatoid arthritis, s/p RLE revasc procedure with history of chronic leg wound receiving wound care from Dr Lajoyce Corners presents with c/o RLE pain and bleeding at dressing site.  Clinical Impression  Pt admitted with above diagnosis. Eval limited due to pt's RLE pain. Pt shakes head yes/no appropriately and appears to understand commands but declines to sit EOB with therapist. Therapist assisted pt up in bed with mod A, pt able to place LLE into hooklying position to assist in scooting up in bed with cues. Pt's spouse at bedside providing PLOF history, goal is to return home with caretaker aides and HHPT. Pt currently with functional limitations due to the deficits listed below (see PT Problem List). Pt will benefit from skilled PT to increase their independence and safety with mobility to allow discharge to the venue listed below.       Follow Up Recommendations Home health PT;Supervision/Assistance - 24 hour    Equipment Recommendations  None recommended by PT    Recommendations for Other Services OT consult     Precautions / Restrictions Precautions Precautions: Fall Precaution Comments: RLE wounds Restrictions Weight Bearing Restrictions: No      Mobility  Bed Mobility  General bed mobility comments: pt declines supine<>sit transfer; cued to place LLE in hooklying position to assist in scooting up in bed with good carryover, requires mod A to scoot up in bed, able assist with LUE once able to reach crossbar    Transfers  General transfer comment: pt declined  Ambulation/Gait  General Gait Details: nonambulatory at baseline due to RLE wounds, uses electric WC per spouse  Stairs             Wheelchair Mobility    Modified Rankin (Stroke Patients Only)       Balance                    Pertinent Vitals/Pain Pain Assessment: Faces Faces Pain Scale: Hurts even more Pain Location: R LE Pain Descriptors / Indicators: Grimacing;Guarding Pain Intervention(s): Limited activity within patient's tolerance;Monitored during session    Home Living Family/patient expects to be discharged to:: Private residence Living Arrangements: Spouse/significant other Available Help at Discharge: Family;Personal care attendant;Available PRN/intermittently           Home Equipment: Hospital bed;Bedside commode;Wheelchair - power;Walker - 2 wheels      Prior Function Level of Independence: Needs assistance   Gait / Transfers Assistance Needed: Spouse reports pt hasn't walked in months due to sores on feet. Pt independent with electric WC, spouse/aide assists with transfers  ADL's / Homemaking Assistance Needed: Aide assists with dressing, sponge baths, cook meals, laundry, toileting  Comments: Caretakers Monday-Friday 8am-3pm     Hand Dominance        Extremity/Trunk Assessment   Upper Extremity Assessment Upper Extremity Assessment: RUE deficits/detail RUE Deficits / Details: RUE appears flaccid, held in slight flexed position without movement noted during eval- requesting OT eval    Lower Extremity Assessment Lower Extremity Assessment: Generalized weakness;RLE deficits/detail;LLE deficits/detail RLE Deficits / Details: wounds noted in R foot and lower leg, shakes head "no" to mobilize toes, ankle, knee or hip on R RLE: Unable to fully assess due to pain LLE Deficits /  Details: AROM WNL, able to wiggle toes, DF/PF ankle, flex/ext knee, completes SLR ~6 in off bed, abd/add straight leg in bed without difficulty LLE Sensation: WNL       Communication   Communication: Expressive difficulties (expressive aphasia)  Cognition Arousal/Alertness:  Awake/alert Behavior During Therapy: WFL for tasks assessed/performed Overall Cognitive Status: Difficult to assess  General Comments: Pt able to follow single step commands appropriately, shakes head yes/no appropriatley to questions      General Comments      Exercises     Assessment/Plan    PT Assessment Patient needs continued PT services  PT Problem List Decreased strength;Decreased range of motion;Decreased activity tolerance;Decreased balance;Decreased mobility;Decreased cognition;Decreased skin integrity;Pain       PT Treatment Interventions DME instruction;Gait training;Functional mobility training;Therapeutic activities;Therapeutic exercise;Balance training;Neuromuscular re-education;Patient/family education    PT Goals (Current goals can be found in the Care Plan section)  Acute Rehab PT Goals Patient Stated Goal: return home with HHPT and aides PT Goal Formulation: With family Time For Goal Achievement: 05/07/20 Potential to Achieve Goals: Fair    Frequency Min 2X/week   Barriers to discharge        Co-evaluation               AM-PAC PT "6 Clicks" Mobility  Outcome Measure Help needed turning from your back to your side while in a flat bed without using bedrails?: A Little Help needed moving from lying on your back to sitting on the side of a flat bed without using bedrails?: A Little Help needed moving to and from a bed to a chair (including a wheelchair)?: A Lot Help needed standing up from a chair using your arms (e.g., wheelchair or bedside chair)?: A Lot Help needed to walk in hospital room?: Total Help needed climbing 3-5 steps with a railing? : Total 6 Click Score: 12    End of Session   Activity Tolerance: Patient limited by pain Patient left: in bed;with call bell/phone within reach;with nursing/sitter in room;with family/visitor present Nurse Communication: Mobility status PT Visit Diagnosis: Other abnormalities of gait and mobility  (R26.89);Muscle weakness (generalized) (M62.81);Pain Pain - Right/Left: Right Pain - part of body: Leg    Time: 1355-1409 PT Time Calculation (min) (ACUTE ONLY): 14 min   Charges:   PT Evaluation $PT Eval Moderate Complexity: 1 Mod           Tori Orrin Yurkovich PT, DPT 04/23/20, 2:33 PM [phone]

## 2020-04-23 NOTE — Telephone Encounter (Signed)
Did you want to try and call this person?

## 2020-04-23 NOTE — Telephone Encounter (Signed)
The phone number is that of her husband.  I contacted him this afternoon.  His wife is hospitalized at [facility].  They are having an orthopedic consult there.  The husband and his daughter have expressed preference that the patient be treated by Dr. Lajoyce Corners if surgery is necessary.  I told him the procedure was getting for here could be certainly organized and that I will make Dr. Lajoyce Corners aware of their preferences

## 2020-04-23 NOTE — Progress Notes (Signed)
Pt arrived to unit @ 2200. VS obtained and WNL. Pt oriented to room and call bell placed within reach. Pt refused all medications. Pt educated and no distress was noted. Will continue to monitor.

## 2020-04-23 NOTE — Plan of Care (Signed)

## 2020-04-23 NOTE — Telephone Encounter (Signed)
Left message with patient

## 2020-04-23 NOTE — Telephone Encounter (Signed)
Karin Golden called on behalf of the pt to make Dr.Duda aware the pt had been admitted to [facility] rehab but then kept losing consciousness as well as losing a lot of blood and had to be transferred to the hospital. Karin Golden would like a CB if Dr. Lajoyce Corners has any advice or can explain what's going on.   [phone]

## 2020-04-23 NOTE — Telephone Encounter (Signed)
Do you want to call and talk to this pt? See message below. We last saw her last week for compression wrap eval.

## 2020-04-23 NOTE — Progress Notes (Unsigned)
[facility]  Patient ID: Danielle Cruz, female   DOB: 02-Apr-1956, 65 y.o.   MRN: [id number] Consult in hospital [facility] Requested by Dr Loleta Books Reason for consultation right leg ulceration  I recommend amputation right leg  based on the history and physical below  No chief complaint on file. No chief complaint on file.  Chief complaint right leg ulcerations   HPI Danielle Cruz is a 65 y.o. female.    This is a 65 year old female followed by Dr. Sharol Given who was admitted to the [facility] under the hospitalist service.  Dr. Loleta Books has asked me to see the patient regarding the status of her right lower extremity.  The patient is noncommunicative and cannot understand her and she does not want her right for me  However, her right leg has 2 areas of ulceration 1 over the dorsum of the foot and one over the posterior medial aspect of the calf.  These are the areas in question. Review of Systems (at least 2) Review of Systems  Unable to perform ROS: Patient nonverbal    Past Medical History:  Diagnosis Date  . AKI (acute kidney injury) ([place]) 09/22/2017  . Anemia   . 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 Arthritis   . CHF (congestive heart failure) ([place]) 10/02/2019  . Colonic polyp 2010   Hemorrhoids; h/o mild hematochezia  . CVA (cerebral infarction) 08/2008   sizable left -residual expressive aphasia, right sided weakness; ambulates with difficulty with the right leg brace   . Hyperlipidemia   . Hypertension 2005  . Osteoporosis 03/28/2020  . Rheumatoid arthritis(714.0)   . Stroke [facility])       has a past medical history of AKI (acute kidney injury) ([place]) (09/22/2017), Anemia, Arteriosclerotic cardiovascular disease (ASCVD) (07/2003), Arthritis, CHF (congestive heart failure) ([place]) (10/02/2019), Colonic polyp (2010), CVA (cerebral infarction) (08/2008), Hyperlipidemia, Hypertension (2005),  Osteoporosis (03/28/2020), Rheumatoid arthritis(714.0), and Stroke ([place]).    Past Surgical History:  Procedure Laterality Date  . ABDOMINAL AORTOGRAM W/LOWER EXTREMITY Bilateral 01/28/2020   Procedure: ABDOMINAL AORTOGRAM W/LOWER EXTREMITY;  Surgeon: Waynetta Sandy, MD;  Location: [place] CV LAB;  Service: Cardiovascular;  Laterality: Bilateral;  . ANKLE SURGERY     Right  . CAROTID STENT INSERTION    . COLONOSCOPY W/ POLYPECTOMY  11/2008   Dr. Gala Romney. Left-sided diverticula, Pedunculated polyp snared (no adenomatous changes)  . COLONOSCOPY WITH PROPOFOL N/A 01/12/2018   Procedure: COLONOSCOPY WITH PROPOFOL;  Surgeon: Daneil Dolin, MD;  [facility];  Service: Endoscopy;  Laterality: N/A;  . FEMUR IM NAIL Right 09/23/2017   Procedure: INTRAMEDULLARY (IM) NAIL FEMORAL;  Surgeon: Renette Butters, MD;  Location: [place];  Service: Orthopedics;  Laterality: Right;  . FOOT SURGERY Right 01/31/2020  . FRACTURE SURGERY     Hip replacement in May 2019 - fall related   . OOPHORECTOMY    . PERIPHERAL VASCULAR BALLOON ANGIOPLASTY Right 01/28/2020   Procedure: PERIPHERAL VASCULAR BALLOON ANGIOPLASTY;  Surgeon: Waynetta Sandy, MD;  Location: [place] CV LAB;  Service: Cardiovascular;  Laterality: Right;  SFA     Family History  Problem Relation Age of Onset  . Cancer Father        prostate, deceased 65s  . Breast cancer Sister        Deceased 39s  . Heart attack Mother        deceased  32, during childbirth  . Cancer Brother        lymphoma  . Colon cancer Maternal Aunt        older than age 37  . Liver disease Neg Hx     Social History   Tobacco Use  . Smoking status: Current Every Day Smoker    Packs/day: 0.50    Years: 15.00    Pack years: 7.50    Types: Cigarettes  . Smokeless tobacco: Never Used  . Tobacco comment: quit after hospitized for hip fracture   Vaping Use  . Vaping Use: Never used  Substance Use Topics  . Alcohol use: No     Alcohol/week: 0.0 standard drinks    Comment: quit 6 years ago   . Drug use: No    Allergies  Allergen Reactions  . Ace Inhibitors Cough    New daily cough since starting ACE     Current Outpatient Medications:  .  acetaminophen (TYLENOL) 325 MG tablet, Take 650 mg by mouth every 6 (six) hours as needed for mild pain or moderate pain., Disp: , Rfl:  .  alendronate (FOSAMAX) 70 MG tablet, 1 tablet 30 minutes before the first food, beverage or medicine of the day with plain water, Disp: , Rfl:  .  amLODipine (NORVASC) 5 MG tablet, TAKE 1 TABLET(5 MG) BY MOUTH DAILY, Disp: 30 tablet, Rfl: 3 .  clopidogrel (PLAVIX) 75 MG tablet, Take 1 tablet (75 mg total) by mouth daily., Disp: 30 tablet, Rfl: 11 .  ezetimibe (ZETIA) 10 MG tablet, TAKE 1 TABLET(10 MG) BY MOUTH DAILY (Patient taking differently: Take 10 mg by mouth daily.), Disp: 90 tablet, Rfl: 3 .  fluticasone (FLONASE) 50 MCG/ACT nasal spray, SHAKE LIQUID AND USE 1 SPRAY IN EACH NOSTRIL DAILY (Patient taking differently: Place 1 spray into both nostrils daily as needed for allergies.), Disp: 16 g, Rfl: 3 .  HYDROcodone-acetaminophen (NORCO/VICODIN) 5-325 MG tablet, Take 1 tablet by mouth daily as needed for moderate pain., Disp: 30 tablet, Rfl: 0 .  hydroxychloroquine (PLAQUENIL) 200 MG tablet, Take 400 mg by mouth daily., Disp: , Rfl:  .  metoprolol succinate (TOPROL-XL) 25 MG 24 hr tablet, TAKE 1 TABLET(25 MG) BY MOUTH DAILY (Patient taking differently: Take 25 mg by mouth daily.), Disp: 30 tablet, Rfl: 11 .  montelukast (SINGULAIR) 10 MG tablet, TAKE 1 TABLET(10 MG) BY MOUTH AT BEDTIME, Disp: 30 tablet, Rfl: 4 .  Multiple Vitamin (MULTIVITAMIN WITH MINERALS) TABS tablet, Take 1 tablet by mouth daily., Disp: , Rfl:  .  nicotine (NICODERM CQ - DOSED IN MG/24 HOURS) 14 mg/24hr patch, Place 1 patch (14 mg total) onto the skin daily. (Patient not taking: Reported on 04/22/2020), Disp: 28 patch, Rfl: 1 .  oxybutynin (DITROPAN) 5 MG tablet, TAKE  1/2 TABLET(2.5 MG) BY MOUTH TWICE DAILY (Patient taking differently: Take 2.5 mg by mouth 2 (two) times daily.), Disp: 90 tablet, Rfl: 3 .  pantoprazole (PROTONIX) 40 MG tablet, TAKE 1 TABLET(40 MG) BY MOUTH DAILY (Patient taking differently: Take 40 mg by mouth daily.), Disp: 30 tablet, Rfl: 5 .  potassium chloride SA (KLOR-CON) 20 MEQ tablet, Take 2 tablets (40 mEq total) by mouth daily., Disp: 14 tablet, Rfl: 0 .  rosuvastatin (CRESTOR) 20 MG tablet, Take one tablet by mouth every Monday, Wednesday, Friday and Sunday (Patient taking differently: Take 20 mg by mouth See admin instructions. Take 1 tablet (20mg ) by mouth every Monday, Wednesday, Friday and Sunday), Disp: 48 tablet, Rfl: 6 .  sulfamethoxazole-trimethoprim (BACTRIM DS) 800-160 MG tablet, Take 1 tablet by mouth 2 (two) times daily., Disp: 20 tablet, Rfl: 0 .  torsemide (DEMADEX) 20 MG tablet, Take 0.5 tablets (10 mg total) by mouth daily., Disp: 45 tablet, Rfl: 3 .  UNABLE TO FIND, Under pads use as needed  Pullups use as needed   Length of need- 99 months DX urinary incontinence, Disp: 100 each, Rfl: 11 .  UNABLE TO FIND, Incontinence briefs and pads Dx incontinence, Disp: 100 each, Rfl: 11 No current facility-administered medications for this visit.  Facility-Administered Medications Ordered in Other Visits:  .  0.9 %  sodium chloride infusion, 250 mL, Intravenous, PRN, Roney Jaffe, MD .  0.9 %  sodium chloride infusion, , Intravenous, PRN, [place], Suann Larry, MD .  acetaminophen (TYLENOL) tablet 650 mg, 650 mg, Oral, Q6H PRN, Roney Jaffe, MD .  amLODipine (NORVASC) tablet 5 mg, 5 mg, Oral, Daily, Roney Jaffe, MD, 5 mg at 04/23/20 1055 .  bisacodyl (DULCOLAX) EC tablet 5 mg, 5 mg, Oral, Daily PRN, Roney Jaffe, MD .  ceFEPIme (MAXIPIME) 2 g in sodium chloride 0.9 % 100 mL IVPB, 2 g, Intravenous, Q8H, Roney Jaffe, MD, Last Rate: 200 mL/hr at 04/23/20 1728, 2 g at 04/23/20 1728 .  clopidogrel (PLAVIX) tablet 75  mg, 75 mg, Oral, Daily, Roney Jaffe, MD, 75 mg at 04/23/20 1056 .  enoxaparin (LOVENOX) injection 40 mg, 40 mg, Subcutaneous, Q24H, Roney Jaffe, MD, 40 mg at 04/23/20 1726 .  ezetimibe (ZETIA) tablet 10 mg, 10 mg, Oral, Daily, Roney Jaffe, MD, 10 mg at 04/23/20 1055 .  HYDROcodone-acetaminophen (NORCO/VICODIN) 5-325 MG per tablet 1 tablet, 1 tablet, Oral, Q6H PRN, [place], Suann Larry, MD, 1 tablet at 04/23/20 1725 .  hydroxychloroquine (PLAQUENIL) tablet 400 mg, 400 mg, Oral, Daily, Roney Jaffe, MD, 400 mg at 04/23/20 1055 .  metoprolol succinate (TOPROL-XL) 24 hr tablet 25 mg, 25 mg, Oral, Daily, Roney Jaffe, MD, 25 mg at 04/23/20 1055 .  montelukast (SINGULAIR) tablet 10 mg, 10 mg, Oral, QHS, Roney Jaffe, MD .  multivitamin with minerals tablet 1 tablet, 1 tablet, Oral, Daily, Roney Jaffe, MD, 1 tablet at 04/23/20 1055 .  nicotine (NICODERM CQ - dosed in mg/24 hours) patch 14 mg, 14 mg, Transdermal, Daily, Roney Jaffe, MD, 14 mg at 04/23/20 1103 .  ondansetron (ZOFRAN) tablet 4 mg, 4 mg, Oral, Q6H PRN **OR** ondansetron (ZOFRAN) injection 4 mg, 4 mg, Intravenous, Q6H PRN, Roney Jaffe, MD .  oxybutynin (DITROPAN) tablet 2.5 mg, 2.5 mg, Oral, BID, Roney Jaffe, MD, 2.5 mg at 04/23/20 1055 .  pantoprazole (PROTONIX) EC tablet 40 mg, 40 mg, Oral, Daily, Roney Jaffe, MD, 40 mg at 04/23/20 1055 .  polyethylene glycol (MIRALAX / GLYCOLAX) packet 17 g, 17 g, Oral, Daily PRN, Roney Jaffe, MD .  potassium chloride SA (KLOR-CON) CR tablet 20 mEq, 20 mEq, Oral, Daily, Roney Jaffe, MD, 20 mEq at 04/23/20 1055 .  rosuvastatin (CRESTOR) tablet 20 mg, 20 mg, Oral, Once per day on Sun Mon Wed Fri, Roney Jaffe, MD, 20 mg at 04/23/20 1108 .  sodium chloride flush (NS) 0.9 % injection 3 mL, 3 mL, Intravenous, Q12H, Roney Jaffe, MD, 3 mL at 04/23/20 1105 .  sodium chloride flush (NS) 0.9 % injection 3 mL, 3 mL, Intravenous, PRN, Roney Jaffe, MD .   torsemide [facility]) tablet 10 mg, 10 mg, Oral, Daily, Roney Jaffe, MD, 10 mg at 04/23/20 1055 .  vancomycin (VANCOREADY) IVPB 1250 mg/250 mL, 1,250 mg, Intravenous,  Q24H, Pollyann Savoy, MD     Physical Exam-Detailed Physical Exam  There were no vitals taken for this visit. There is no height or weight on file to calculate BMI.  Gen. appearance patient's skin is dark characteristic of renal disease  She has poor peripheral pulses with again darkened hardened skin and ulcerations over the dorsum of the foot and posterior medial aspect of the right leg  The ambulatory status is unknown  Extremity examined right lower Please see photographs which were reviewed which give a better explanation and visualization of this patient's right lower extremity  I Sensation to touch is abnormal she did not respond to painful stimuli where the skin was dark I could not tell the patient's true orientation to person place and time but she recognized me she seemed alert she seemed awake   Opposite extremity :   MEDICAL DECISION MAKING (minimum/low)  Data Reviewed  I have personally reviewed the imaging studies and the report and my interpretation is:  There is no acute fracture in the right lower extremity  Assessment and Plan  The patient has been followed by Dr. Lajoyce Corners and he is a wound care specialist.  The family has verbalized to Dr. Maryfrances Bunnell that they have been there for care be done by Dr. Lajoyce Corners  I am signing off on this patient's situation but the right lower extremity does not look viable in my opinion  Fuller Canada 04/23/2020, 8:11 PM   Vickki Hearing MD

## 2020-04-23 NOTE — Plan of Care (Signed)
  Problem: Acute Rehab PT Goals(only PT should resolve) Goal: Pt Will Go Supine/Side To Sit Outcome: Progressing Flowsheets (Taken 04/23/2020 1435) Pt will go Supine/Side to Sit: with minimal assist Goal: Pt Will Go Sit To Supine/Side Outcome: Progressing Flowsheets (Taken 04/23/2020 1435) Pt will go Sit to Supine/Side: with minimal assist Goal: Patient Will Transfer Sit To/From Stand Outcome: Progressing Flowsheets (Taken 04/23/2020 1435) Patient will transfer sit to/from stand: with moderate assist Goal: Pt Will Transfer Bed To Chair/Chair To Bed Outcome: Progressing Flowsheets (Taken 04/23/2020 1435) Pt will Transfer Bed to Chair/Chair to Bed: with mod assist    Tori Temari Schooler PT, DPT 04/23/20, 2:36 PM [phone]

## 2020-04-24 ENCOUNTER — Ambulatory Visit (INDEPENDENT_AMBULATORY_CARE_PROVIDER_SITE_OTHER): Payer: Medicare Other | Admitting: Orthopedic Surgery

## 2020-04-24 ENCOUNTER — Telehealth: Payer: Self-pay | Admitting: Orthopedic Surgery

## 2020-04-24 ENCOUNTER — Encounter: Payer: Self-pay | Admitting: Orthopedic Surgery

## 2020-04-24 ENCOUNTER — Other Ambulatory Visit: Payer: Self-pay

## 2020-04-24 DIAGNOSIS — I96 Gangrene, not elsewhere classified: Secondary | ICD-10-CM

## 2020-04-24 NOTE — Telephone Encounter (Signed)
Thanks! Opened time at 2:15

## 2020-04-24 NOTE — Progress Notes (Signed)
Office Visit Note   Patient: Danielle Cruz           Date of Birth: October 21, 1955           MRN: [id number] Visit Date: 04/24/2020              Requested by: Fayrene Helper, MD [address], [place],  [place] [address] PCP: Fayrene Helper, MD  Chief Complaint  Patient presents with  . Right Leg - Open Wound      HPI: Patient is a 65 year old woman who has had a stroke involving the right upper and right lower extremity she has peripheral vascular disease and underwent revascularization with Dr. Donzetta Matters.  Patient was admitted to [facility] due to progressive wound breakdown and bleeding and was discharged today for [place] to evaluate her in the office.  Patient complains of pain in her right leg.  Assessment & Plan: Visit Diagnoses:  1. Gangrene of right foot ([place])     Plan: Patient has a follow-up appointment with Dr. Donzetta Matters tomorrow.  We will apply dry dressing and they will change this as needed.  Discussed that patient may require an above-the-knee amputation on the right.  With her stroke and lack of motor function with the right knee and ulcerative tissue that stands to the mid calf she does not have enough knee function nor healthy soft tissue envelope to consider a below the knee amputation.  I will follow-up on Monday.  Follow-Up Instructions: Return in about 1 week (around 05/01/2020) for Follow-up on Monday.Manson Passey Exam  Patient is alert, oriented, no adenopathy, well-dressed, normal affect, normal respiratory effort. Examination patient has large gangrenous necrotic gangrenous ulcer on the dorsum of the right foot and posterior right calf with necrotic ulceration up to the mid calf.  Patient's leg is tender to palpation.  She has no active flexion and extension of the right knee or ankle.  Imaging: No results found. No images are attached to the encounter.  Labs: Lab Results  Component Value Date   HGBA1C 6.1 (H) 12/22/2019   HGBA1C 5.7 (H)  11/25/2015   HGBA1C 5.6 11/18/2014   ESRSEDRATE 125 (H) 04/22/2020   ESRSEDRATE 72 (H) 12/22/2019   CRP 1.4 (H) 12/22/2019   LABURIC 4.1 06/05/2010   REPTSTATUS PENDING 04/22/2020   CULT  04/22/2020    NO GROWTH 2 DAYS Performed at [facility], [address] [place], [place] [address]      Lab Results  Component Value Date   ALBUMIN 2.6 (L) 04/22/2020   ALBUMIN 2.5 (L) 04/01/2020   ALBUMIN 3.3 (L) 10/01/2019   LABURIC 4.1 06/05/2010    Lab Results  Component Value Date   MG 2.0 09/24/2017   MG 1.6 (L) 09/23/2017   MG 1.7 11/25/2015   Lab Results  Component Value Date   VD25OH 85 01/10/2020   VD25OH 59 01/22/2019   VD25OH 34 10/25/2017    No results found for: PREALBUMIN CBC EXTENDED Latest Ref Rng & Units 04/22/2020 04/01/2020 01/28/2020  WBC 4.0 - 10.5 K/uL 14.9(H) 8.2 -  RBC 3.87 - 5.11 MIL/uL 2.97(L) 3.36(L) -  HGB 12.0 - 15.0 g/dL 9.8(L) 11.0(L) 11.6(L)  HCT 36.0 - 46.0 % 31.4(L) 33.9(L) 34.0(L)  PLT 150 - 400 K/uL 342 299 -  NEUTROABS 1.7 - 7.7 K/uL 8.8(H) 5.1 -  LYMPHSABS 0.7 - 4.0 K/uL 4.3(H) 2.0 -     There is no height or weight on file  to calculate BMI.  Orders:  No orders of the defined types were placed in this encounter.  No orders of the defined types were placed in this encounter.    Procedures: No procedures performed  Clinical Data: No additional findings.  ROS:  All other systems negative, except as noted in the HPI. Review of Systems  Objective: Vital Signs: There were no vitals taken for this visit.  Specialty Comments:  No specialty comments available.  PMFS History: Patient Active Problem List   Diagnosis Date Noted  . Cellulitis of foot, right 04/22/2020  . Cellulitis of right lower leg 04/22/2020  . 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 Gangrene of extremity ([place])   . History of CVA (cerebrovascular accident)   . Chronic pain 03/28/2020  . Fatty liver 03/28/2020  . Osteoporosis  03/28/2020  . Long term (current) use of opiate analgesic 03/28/2020  . 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 Cellulitis 12/23/2019  . Ulcer of right foot ([place]) 12/22/2019  . PVD (peripheral vascular disease) ([place]) 12/20/2019  . Incontinence in female 10/05/2019  . Urinary incontinence in female 10/02/2019  . Chronic diastolic CHF (congestive heart failure) ([place]) 10/02/2019  . Diverticulosis of colon 02/04/2018  . Hemorrhoid 02/04/2018  . FH: breast cancer in relative when <68 years old 01/25/2018  . Hematochezia   . GI bleed 10/03/2017  . Hip fracture ([place]) 09/22/2017  . AKI (acute kidney injury) ([place]) 09/22/2017  . At high risk for falls 01/31/2017  . Disorder of bone and cartilage 05/11/2016  . Normocytic anemia 01/08/2015  . Elevated ferritin 01/08/2015  . Presence of stent in coronary artery in patient with coronary artery disease 12/07/2014  . Need for influenza vaccination 05/10/2014  . Fasting hyperglycemia 11/05/2011  . Elevated transaminase level 01/08/2011  . Tobacco abuse   . Rheumatoid arthritis ([place])   . Colonic polyp   . 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 Peripheral vascular disease ([place]) 01/14/2010  . Macrocytic anemia 04/01/2009  . Hyperlipidemia 11/14/2008  . Essential hypertension 11/14/2008  . Hypertension 2005   Past Medical History:  Diagnosis Date  . AKI (acute kidney injury) ([employer]) 09/22/2017  . Anemia   . 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 Arthritis   . CHF (congestive heart failure) ([place]) 10/02/2019  . Colonic polyp 2010   Hemorrhoids; h/o mild hematochezia  . CVA (cerebral infarction) 08/2008   sizable left -residual expressive aphasia, right sided weakness; ambulates with difficulty with the right leg brace   . Hyperlipidemia   . Hypertension 2005  . Osteoporosis 03/28/2020  . Rheumatoid  arthritis(714.0)   . Stroke [facility])     Family History  Problem Relation Age of Onset  . Cancer Father        prostate, deceased 73s  . Breast cancer Sister        Deceased 29s  . Heart attack Mother        deceased 68, during childbirth  . Cancer Brother        lymphoma  . Colon cancer Maternal Aunt        older than age 10  . Liver disease Neg Hx     Past Surgical History:  Procedure Laterality Date  . ABDOMINAL AORTOGRAM W/LOWER EXTREMITY Bilateral 01/28/2020   Procedure: ABDOMINAL AORTOGRAM W/LOWER EXTREMITY;  Surgeon: Maeola Harman, MD;  Location: [facility] INVASIVE CV LAB;  Service: Cardiovascular;  Laterality: Bilateral;  . ANKLE SURGERY     Right  . CAROTID STENT INSERTION    . COLONOSCOPY W/ POLYPECTOMY  11/2008   Dr. Jena Gauss. Left-sided diverticula, Pedunculated polyp snared (no adenomatous changes)  . COLONOSCOPY WITH PROPOFOL N/A 01/12/2018   Procedure: COLONOSCOPY WITH PROPOFOL;  Surgeon: Corbin Ade, MD;  [facility];  Service: Endoscopy;  Laterality: N/A;  . FEMUR IM NAIL Right 09/23/2017   Procedure: INTRAMEDULLARY (IM) NAIL FEMORAL;  Surgeon: Sheral Apley, MD;  Location: [facility] OR;  Service: Orthopedics;  Laterality: Right;  . FOOT SURGERY Right 01/31/2020  . FRACTURE SURGERY     Hip replacement in May 2019 - fall related   . OOPHORECTOMY    . PERIPHERAL VASCULAR BALLOON ANGIOPLASTY Right 01/28/2020   Procedure: PERIPHERAL VASCULAR BALLOON ANGIOPLASTY;  Surgeon: Maeola Harman, MD;  Location: [facility] INVASIVE CV LAB;  Service: Cardiovascular;  Laterality: Right;  SFA   Social History   Occupational History  . Occupation: disabilty x 9years    Comment: secondary to arthritis   Tobacco Use  . Smoking status: Current Every Day Smoker    Packs/day: 0.50    Years: 15.00    Pack years: 7.50    Types: Cigarettes  . Smokeless tobacco: Never Used  . Tobacco comment: quit after hospitized for hip fracture   Vaping Use  . Vaping Use: Never  used  Substance and Sexual Activity  . Alcohol use: No    Alcohol/week: 0.0 standard drinks    Comment: quit 6 years ago   . Drug use: No  . Sexual activity: Not on file

## 2020-04-24 NOTE — Telephone Encounter (Signed)
Danielle Cruz has were he wants to see pt Monday. Can you please open slot for him or Persons and let me know so I can call with appt time

## 2020-04-25 ENCOUNTER — Ambulatory Visit (HOSPITAL_COMMUNITY)
Admission: RE | Admit: 2020-04-25 | Discharge: 2020-04-25 | Disposition: A | Discharge status: Home / Self Care | Payer: Medicare Other | Source: Ambulatory Visit | Attending: Vascular Surgery | Admitting: Vascular Surgery

## 2020-04-25 ENCOUNTER — Ambulatory Visit (INDEPENDENT_AMBULATORY_CARE_PROVIDER_SITE_OTHER): Payer: Medicare Other | Admitting: Vascular Surgery

## 2020-04-25 ENCOUNTER — Encounter (HOSPITAL_COMMUNITY): Payer: Self-pay

## 2020-04-25 ENCOUNTER — Encounter: Payer: Medicare Other | Admitting: Vascular Surgery

## 2020-04-25 ENCOUNTER — Encounter (HOSPITAL_COMMUNITY): Payer: Medicare Other

## 2020-04-25 ENCOUNTER — Encounter: Payer: Self-pay | Admitting: Vascular Surgery

## 2020-04-25 VITALS — BP 133/80 | HR 53 | Temp 97.6°F | Resp 20 | Ht 67.0 in | Wt 151.0 lb

## 2020-04-25 DIAGNOSIS — I739 Peripheral vascular disease, unspecified: Secondary | ICD-10-CM

## 2020-04-25 NOTE — Progress Notes (Signed)
Patient ID: Danielle Cruz, female   DOB: 07-28-1955, 65 y.o.   MRN: [id number]  Reason for Consult: Routine Post Op   Referred by Fayrene Helper, MD  Subjective:     HPI:  Danielle Cruz is a 65 y.o. female with extensive right lower extremity wound.  She does have right lower extremity weakness from previous stroke.  She has significant limp was wearing a leg brace on the right has not been walking recently.  She has undergone right lower extremity revascularization with SFA balloon angioplasty.  She has remained on aspirin and Plavix.  Per her significant other she is planning for above-knee amputation with Dr. Sharol Given.  Past Medical History:  Diagnosis Date  . AKI (acute kidney injury) ([place]) 09/22/2017  . Anemia   . 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 Arthritis   . CHF (congestive heart failure) ([place]) 10/02/2019  . Colonic polyp 2010   Hemorrhoids; h/o mild hematochezia  . CVA (cerebral infarction) 08/2008   sizable left -residual expressive aphasia, right sided weakness; ambulates with difficulty with the right leg brace   . Hyperlipidemia   . Hypertension 2005  . Osteoporosis 03/28/2020  . Rheumatoid arthritis(714.0)   . Stroke [facility])    Family History  Problem Relation Age of Onset  . Cancer Father        prostate, deceased 53s  . Breast cancer Sister        Deceased 67s  . Heart attack Mother        deceased 44, during childbirth  . Cancer Brother        lymphoma  . Colon cancer Maternal Aunt        older than age 86  . Liver disease Neg Hx    Past Surgical History:  Procedure Laterality Date  . ABDOMINAL AORTOGRAM W/LOWER EXTREMITY Bilateral 01/28/2020   Procedure: ABDOMINAL AORTOGRAM W/LOWER EXTREMITY;  Surgeon: Waynetta Sandy, MD;  Location: [place] [facility];  Service: Cardiovascular;  Laterality: Bilateral;  . ANKLE SURGERY     Right  . CAROTID STENT INSERTION    .  COLONOSCOPY W/ POLYPECTOMY  11/2008   Dr. Gala Romney. Left-sided diverticula, Pedunculated polyp snared (no adenomatous changes)  . COLONOSCOPY WITH PROPOFOL N/A 01/12/2018   Procedure: COLONOSCOPY WITH PROPOFOL;  Surgeon: Daneil Dolin, MD;  [facility];  Service: Endoscopy;  Laterality: N/A;  . FEMUR IM NAIL Right 09/23/2017   Procedure: INTRAMEDULLARY (IM) NAIL FEMORAL;  Surgeon: Renette Butters, MD;  Location: [place];  Service: Orthopedics;  Laterality: Right;  . FOOT SURGERY Right 01/31/2020  . FRACTURE SURGERY     Hip replacement in May 2019 - fall related   . OOPHORECTOMY    . PERIPHERAL VASCULAR BALLOON ANGIOPLASTY Right 01/28/2020   Procedure: PERIPHERAL VASCULAR BALLOON ANGIOPLASTY;  Surgeon: Waynetta Sandy, MD;  Location: [place] [facility];  Service: Cardiovascular;  Laterality: Right;  SFA    Short Social History:  Social History   Tobacco Use  . Smoking status: Current Every Day Smoker    Packs/day: 0.50    Years: 15.00    Pack years: 7.50    Types: Cigarettes  . Smokeless tobacco: Never Used  . Tobacco comment: quit after hospitized for hip fracture   Substance Use Topics  . Alcohol use: No    Alcohol/week: 0.0 standard drinks    Comment: quit 6  years ago     Allergies  Allergen Reactions  . Ace Inhibitors Cough    New daily cough since starting ACE    Current Outpatient Medications  Medication Sig Dispense Refill  . acetaminophen (TYLENOL) 325 MG tablet Take 650 mg by mouth every 6 (six) hours as needed for mild pain or moderate pain.    Marland Kitchen alendronate (FOSAMAX) 70 MG tablet 1 tablet 30 minutes before the first food, beverage or medicine of the day with plain water    . amLODipine (NORVASC) 5 MG tablet TAKE 1 TABLET(5 MG) BY MOUTH DAILY 30 tablet 3  . clopidogrel (PLAVIX) 75 MG tablet Take 1 tablet (75 mg total) by mouth daily. 30 tablet 11  . ezetimibe (ZETIA) 10 MG tablet TAKE 1 TABLET(10 MG) BY MOUTH DAILY (Patient taking differently: Take  10 mg by mouth daily.) 90 tablet 3  . fluticasone (FLONASE) 50 MCG/ACT nasal spray SHAKE LIQUID AND USE 1 SPRAY IN EACH NOSTRIL DAILY (Patient taking differently: Place 1 spray into both nostrils daily as needed for allergies.) 16 g 3  . HYDROcodone-acetaminophen (NORCO/VICODIN) 5-325 MG tablet Take 1 tablet by mouth daily as needed for moderate pain. 30 tablet 0  . hydroxychloroquine (PLAQUENIL) 200 MG tablet Take 400 mg by mouth daily.    . metoprolol succinate (TOPROL-XL) 25 MG 24 hr tablet TAKE 1 TABLET(25 MG) BY MOUTH DAILY (Patient taking differently: Take 25 mg by mouth daily.) 30 tablet 11  . montelukast (SINGULAIR) 10 MG tablet TAKE 1 TABLET(10 MG) BY MOUTH AT BEDTIME 30 tablet 4  . Multiple Vitamin (MULTIVITAMIN WITH MINERALS) TABS tablet Take 1 tablet by mouth daily.    . nicotine (NICODERM CQ - DOSED IN MG/24 HOURS) 14 mg/24hr patch Place 1 patch (14 mg total) onto the skin daily. 28 patch 1  . oxybutynin (DITROPAN) 5 MG tablet TAKE 1/2 TABLET(2.5 MG) BY MOUTH TWICE DAILY (Patient taking differently: Take 2.5 mg by mouth 2 (two) times daily.) 90 tablet 3  . pantoprazole (PROTONIX) 40 MG tablet TAKE 1 TABLET(40 MG) BY MOUTH DAILY (Patient taking differently: Take 40 mg by mouth daily.) 30 tablet 5  . potassium chloride SA (KLOR-CON) 20 MEQ tablet Take 2 tablets (40 mEq total) by mouth daily. 14 tablet 0  . rosuvastatin (CRESTOR) 20 MG tablet Take one tablet by mouth every Monday, Wednesday, Friday and Sunday (Patient taking differently: Take 20 mg by mouth See admin instructions. Take 1 tablet (20mg ) by mouth every Monday, Wednesday, Friday and Sunday) 48 tablet 6  . sulfamethoxazole-trimethoprim (BACTRIM DS) 800-160 MG tablet Take 1 tablet by mouth 2 (two) times daily. 20 tablet 0  . UNABLE TO FIND Under pads use as needed  Pullups use as needed   Length of need- 99 months DX urinary incontinence 100 each 11  . UNABLE TO FIND Incontinence briefs and pads Dx incontinence 100 each 11  .  torsemide (DEMADEX) 20 MG tablet Take 0.5 tablets (10 mg total) by mouth daily. 45 tablet 3   No current facility-administered medications for this visit.    Review of Systems  Constitutional:  Constitutional negative. HENT: HENT negative.  Eyes: Eyes negative.  Respiratory: Respiratory negative.  Cardiovascular: Cardiovascular negative.  Musculoskeletal: Positive for leg pain.  Skin: Positive for wound.  Neurological: Neurological negative. Hematologic: Hematologic/lymphatic negative.  Psychiatric: Psychiatric negative.        Objective:  Objective   Vitals:   04/25/20 1430  BP: 133/80  Pulse: (!) 53  Resp: 20  Temp:  97.6 F (36.4 C)  SpO2: 98%    Physical Exam HENT:     Head: Normocephalic.  Cardiovascular:     Rate and Rhythm: Normal rate.  Pulmonary:     Effort: Pulmonary effort is normal.  Abdominal:     General: Abdomen is flat.     Palpations: Abdomen is soft.  Musculoskeletal:     Comments: Patient refused dressing change today  Skin:    Comments: Right leg dressing  Neurological:     Mental Status: She is alert.     Motor: Weakness present.  Psychiatric:        Mood and Affect: Mood normal.     Data: Studies were refused     Assessment/Plan:     65 year old female with extensive right lower extremity wound she does have previous stroke with weakness in the right leg has not been ambulating due to the wound.  She is planned for above-knee amputation in the near future.  We will see her back in 6 months to monitor the blood flow in her left leg.    Waynetta Sandy MD [facility]

## 2020-04-27 LAB — CULTURE, BLOOD (ROUTINE X 2): Culture: NO GROWTH

## 2020-04-28 ENCOUNTER — Encounter: Payer: Self-pay | Admitting: Orthopedic Surgery

## 2020-04-28 ENCOUNTER — Ambulatory Visit (INDEPENDENT_AMBULATORY_CARE_PROVIDER_SITE_OTHER): Payer: Medicare Other | Admitting: Orthopedic Surgery

## 2020-04-28 DIAGNOSIS — I96 Gangrene, not elsewhere classified: Secondary | ICD-10-CM | POA: Diagnosis not present

## 2020-04-28 NOTE — Progress Notes (Signed)
Office Visit Note   Patient: Danielle Cruz           Date of Birth: 1955-07-15           MRN: [id number] Visit Date: 04/28/2020              Requested by: Fayrene Helper, MD [address], [place],  [place] [address] PCP: Fayrene Helper, MD  Chief Complaint  Patient presents with  . Right Leg - Follow-up    Gangrene discuss possible AKA       HPI: Patient is a 65 year old woman with peripheral vascular disease and a stroke involving the right upper and right lower extremity.  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  Patient most recently has been seen by vascular surgery and by report she was not very cooperative.  Recommendations were to proceed with amputation surgery.  Patient is also seeing Dr. Aline Brochure in [place] and recommendations are also to proceed with amputation surgery.  Assessment & Plan: Visit Diagnoses:  1. Gangrene of right foot ([place])     Plan: Discussed with the patient recommendation to proceed with amputation surgery.  With the long femoral nail on the right foot intertrochanteric hip fracture feel that we could proceed with amputation just proximal to the metaphyseal flare of the right leg proceed with a long above-the-knee amputation.  Risk and benefits were discussed including risk of the wound not healing.  Anticipate patient will need discharge to skilled nursing.  Patient states she understands and wishes to proceed with surgery on Wednesday.  Follow-Up Instructions: Return in about 2 weeks (around 05/12/2020).   Ortho Exam  Patient is alert, oriented, no adenopathy, well-dressed, normal affect, normal respiratory effort. Examination patient has progressive gangrenous ulceration of the mid aspect of the posterior calf on the right with a large necrotic ulcer over the dorsum of the right foot.  Patient has just stopped her Plavix and is currently on aspirin.  Review of the  radiographs shows a long femoral nail for an intertrochanteric hip fracture that extends down to the proximal aspect of the metaphyseal flare of the right knee.  Patient has a stroke that involves the right upper and right lower extremity with no active function of her right hand.  This will make it difficult for mobilization with therapy.  Imaging: No results found. No images are attached to the encounter.  Labs: Lab Results  Component Value Date   HGBA1C 6.1 (H) 12/22/2019   HGBA1C 5.7 (H) 11/25/2015   HGBA1C 5.6 11/18/2014   ESRSEDRATE 125 (H) 04/22/2020   ESRSEDRATE 72 (H) 12/22/2019   CRP 1.4 (H) 12/22/2019   LABURIC 4.1 06/05/2010   REPTSTATUS 04/27/2020 FINAL 04/22/2020   CULT  04/22/2020    NO GROWTH 5 DAYS Performed at [facility], [address] [place], [place] [address]      Lab Results  Component Value Date   ALBUMIN 2.6 (L) 04/22/2020   ALBUMIN 2.5 (L) 04/01/2020   ALBUMIN 3.3 (L) 10/01/2019   LABURIC 4.1 06/05/2010    Lab Results  Component Value Date   MG 2.0 09/24/2017   MG 1.6 (L) 09/23/2017   MG 1.7 11/25/2015   Lab Results  Component Value Date   VD25OH 85 01/10/2020   VD25OH 59 01/22/2019   VD25OH 34 10/25/2017    No results found for: PREALBUMIN CBC EXTENDED Latest Ref Rng & Units 04/22/2020 04/01/2020  01/28/2020  WBC 4.0 - 10.5 K/uL 14.9(H) 8.2 -  RBC 3.87 - 5.11 MIL/uL 2.97(L) 3.36(L) -  HGB 12.0 - 15.0 g/dL 9.8(L) 11.0(L) 11.6(L)  HCT 36.0 - 46.0 % 31.4(L) 33.9(L) 34.0(L)  PLT 150 - 400 K/uL 342 299 -  NEUTROABS 1.7 - 7.7 K/uL 8.8(H) 5.1 -  LYMPHSABS 0.7 - 4.0 K/uL 4.3(H) 2.0 -     There is no height or weight on file to calculate BMI.  Orders:  No orders of the defined types were placed in this encounter.  No orders of the defined types were placed in this encounter.    Procedures: No procedures performed  Clinical Data: No additional findings.  ROS:  All other systems negative, except as noted in the HPI. Review of  Systems  Objective: Vital Signs: There were no vitals taken for this visit.  Specialty Comments:  No specialty comments available.  PMFS History: Patient Active Problem List   Diagnosis Date Noted  . Cellulitis of foot, right 04/22/2020  . Cellulitis of right lower leg 04/22/2020  . 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 Gangrene of extremity ([place])   . History of CVA (cerebrovascular accident)   . Chronic pain 03/28/2020  . Fatty liver 03/28/2020  . Osteoporosis 03/28/2020  . Long term (current) use of opiate analgesic 03/28/2020  . Ischemic ulcer of lower leg due to atherosclerotic disease ([place]) 02/14/2020  . Cellulitis 12/23/2019  . Ulcer of right foot ([place]) 12/22/2019  . PVD (peripheral vascular disease) ([place]) 12/20/2019  . Incontinence in female 10/05/2019  . Urinary incontinence in female 10/02/2019  . Chronic diastolic CHF (congestive heart failure) ([place]) 10/02/2019  . Diverticulosis of colon 02/04/2018  . Hemorrhoid 02/04/2018  . FH: breast cancer in relative when <75 years old 01/25/2018  . Hematochezia   . GI bleed 10/03/2017  . Hip fracture ([place]) 09/22/2017  . AKI (acute kidney injury) ([place]) 09/22/2017  . At high risk for falls 01/31/2017  . Disorder of bone and cartilage 05/11/2016  . Normocytic anemia 01/08/2015  . Elevated ferritin 01/08/2015  . Presence of stent in coronary artery in patient with coronary artery disease 12/07/2014  . Need for influenza vaccination 05/10/2014  . Fasting hyperglycemia 11/05/2011  . Elevated transaminase level 01/08/2011  . Tobacco abuse   . Rheumatoid arthritis ([place])   . Colonic polyp   . 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 Peripheral vascular disease ([place]) 01/14/2010  . Macrocytic anemia 04/01/2009  . Hyperlipidemia 11/14/2008  . Essential hypertension 11/14/2008  . Hypertension 2005   Past Medical History:  Diagnosis Date  . AKI (acute kidney  injury) ([place]) 09/22/2017  . Anemia   . 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 Arthritis   . CHF (congestive heart failure) ([place]) 10/02/2019  . Colonic polyp 2010   Hemorrhoids; h/o mild hematochezia  . CVA (cerebral infarction) 08/2008   sizable left -residual expressive aphasia, right sided weakness; ambulates with difficulty with the right leg brace   . Hyperlipidemia   . Hypertension 2005  . Osteoporosis 03/28/2020  . Rheumatoid arthritis(714.0)   . Stroke [facility])     Family History  Problem Relation Age of Onset  . Cancer Father        prostate, deceased 34s  . Breast cancer Sister        Deceased 35s  .  Heart attack Mother        deceased 26, during childbirth  . Cancer Brother        lymphoma  . Colon cancer Maternal Aunt        older than age 95  . Liver disease Neg Hx     Past Surgical History:  Procedure Laterality Date  . ABDOMINAL AORTOGRAM W/LOWER EXTREMITY Bilateral 01/28/2020   Procedure: ABDOMINAL AORTOGRAM W/LOWER EXTREMITY;  Surgeon: Waynetta Sandy, MD;  Location: [place] [facility];  Service: Cardiovascular;  Laterality: Bilateral;  . ANKLE SURGERY     Right  . CAROTID STENT INSERTION    . COLONOSCOPY W/ POLYPECTOMY  11/2008   Dr. Gala Romney. Left-sided diverticula, Pedunculated polyp snared (no adenomatous changes)  . COLONOSCOPY WITH PROPOFOL N/A 01/12/2018   Procedure: COLONOSCOPY WITH PROPOFOL;  Surgeon: Daneil Dolin, MD;  [facility];  Service: Endoscopy;  Laterality: N/A;  . FEMUR IM NAIL Right 09/23/2017   Procedure: INTRAMEDULLARY (IM) NAIL FEMORAL;  Surgeon: Renette Butters, MD;  Location: [place];  Service: Orthopedics;  Laterality: Right;  . FOOT SURGERY Right 01/31/2020  . FRACTURE SURGERY     Hip replacement in May 2019 - fall related   . OOPHORECTOMY    . PERIPHERAL VASCULAR BALLOON ANGIOPLASTY Right 01/28/2020   Procedure: PERIPHERAL VASCULAR BALLOON  ANGIOPLASTY;  Surgeon: Waynetta Sandy, MD;  Location: [place] [facility];  Service: Cardiovascular;  Laterality: Right;  SFA   Social History   Occupational History  . Occupation: disabilty x 9years    Comment: secondary to arthritis   Tobacco Use  . Smoking status: Current Every Day Smoker    Packs/day: 0.50    Years: 15.00    Pack years: 7.50    Types: Cigarettes  . Smokeless tobacco: Never Used  . Tobacco comment: quit after hospitized for hip fracture   Vaping Use  . Vaping Use: Never used  Substance and Sexual Activity  . Alcohol use: No    Alcohol/week: 0.0 standard drinks    Comment: quit 6 years ago   . Drug use: No  . Sexual activity: Not on file

## 2020-04-29 ENCOUNTER — Other Ambulatory Visit: Payer: Self-pay

## 2020-04-29 ENCOUNTER — Encounter (HOSPITAL_COMMUNITY): Payer: Self-pay | Admitting: Orthopedic Surgery

## 2020-04-29 ENCOUNTER — Telehealth: Payer: Self-pay | Admitting: Orthopedic Surgery

## 2020-04-29 ENCOUNTER — Other Ambulatory Visit: Payer: Self-pay | Admitting: Physician Assistant

## 2020-04-29 DIAGNOSIS — I739 Peripheral vascular disease, unspecified: Secondary | ICD-10-CM

## 2020-04-29 MED ORDER — SODIUM CHLORIDE 0.9% IV SOLUTION: Freq: Once | INTRAVENOUS | Status: AC

## 2020-04-29 NOTE — Progress Notes (Signed)
PCP - Dr. Moshe Cipro  Cardiologist - Dr. Harl Bowie  Chest x-ray - n/a EKG - 04/23/20 Stress Test - denies  ECHO - 10/17/19 Cardiac Cath - ~2005 per sister [name]  Follow your surgeon's instructions on when to stop Aspirin and clopidogrel (PLAVIX) .  If no instructions were given by your surgeon then you will need to call the office to get those instructions.    ERAS Protcol - clears 0630  COVID TEST- DOS  Anesthesia review: cardiac hx  -------------  SDW INSTRUCTIONS:  Your procedure is scheduled on 04/30/20. Please report to [facility] Main Entrance "A" at 06:30 A.M., and check in at the Admitting office. Call this number if you have problems the morning of surgery: [phone]   Remember: Do not eat after midnight the night before your surgery  You may drink clear liquids until 0630 AM the morning of your surgery.   Clear liquids allowed are: Water, Non-Citrus Juices (without pulp), Carbonated Beverages, Clear Tea, Black Coffee Only, and Gatorade   Medications to take morning of surgery with a sip of water include: ezetimibe (ZETIA) hydroxychloroquine (PLAQUENIL)  metoprolol succinate (TOPROL-XL)  pantoprazole (PROTONIX) rosuvastatin (CRESTOR) sulfamethoxazole-trimethoprim (BACTRIM DS)  If needed: acetaminophen (TYLENOL)  amLODipine (NORVASC)  fluticasone (FLONASE)  Follow your surgeon's instructions on when to stop Aspirin and clopidogrel (PLAVIX) .  If no instructions were given by your surgeon then you will need to call the office to get those instructions.    As of today, STOP taking any Aleve, Naproxen, Ibuprofen, Motrin, Advil, Goody's, BC's, all herbal medications, fish oil, and all vitamins.    The Morning of Surgery Do not wear jewelry, make-up or nail polish. Do not wear lotions, powders, or perfumes, or deodorant Do not shave 48 hours prior to surgery.  Do not bring valuables to the hospital. [facility] is not responsible for any belongings or valuables. If you  are a smoker, DO NOT Smoke 24 hours prior to surgery If you wear a CPAP at night please bring your mask the morning of surgery  Remember that you must have someone to transport you home after your surgery, and remain with you for 24 hours if you are discharged the same day. Please bring cases for contacts, glasses, hearing aids, dentures or bridgework because it cannot be worn into surgery.   Patients discharged the day of surgery will not be allowed to drive home.   Please shower the NIGHT BEFORE SURGERY and the MORNING OF SURGERY with DIAL Soap. Wear comfortable clothes the morning of surgery. Oral Hygiene is also important to reduce your risk of infection.  Remember - BRUSH YOUR TEETH THE MORNING OF SURGERY WITH YOUR REGULAR TOOTHPASTE  Patient denies shortness of breath, fever, cough and chest pain.

## 2020-04-29 NOTE — Telephone Encounter (Signed)
[name] the pts sister would like to know if and when the pt should stop taking her baby asprins and would like a CB with answer.  [phone]

## 2020-04-29 NOTE — Progress Notes (Signed)
Anesthesia Chart Review: Danielle Cruz   Case: [id number] Date/Time: 04/30/20 0916   Procedure: RIGHT ABOVE KNEE AMPUTATION (Right Knee)   Anesthesia type: Choice   Pre-op diagnosis: Gangrene Right Leg   Location: [facility] OR ROOM 03 / [place] [facility]   Surgeons: [name], MD      DISCUSSION: Patient is a 65 year old female scheduled for the above procedure.   History includes smoking, CAD (s/p BMS dLAD & DES OM1 07/24/03), CHF, HLD, HTN, CVA (2010, dense aphasia, right sided weakness), PAD (s/p right popliteal and SFA angioplasty 01/28/20), RA, anemia, AKI (2019), osteoporosis.  - Admission [facility] 04/22/20-04/23/20 for right foot chronic wound with bleeding and cellulitis. Tachycardic in ED, WBC 14.9K, H/H 9.8/31.4 (down form 11.0/33.9 on 04/01/20), Cr 0.76, K 2.9 (repleated and discharged on oral KCl). Patient was treated with broad-spectrum antibiotics.  Radiograph of the right foot and tib-fib showed new fractures of the proximal fourth and fifth toes, chronic Charcot arthropathy, no definite osteomyelitis. Dr. Aline Brochure with Orthopedics consulted and felt foot would likely require amputation. She was discharged on Bactim and follow-up with primary orthopedic surgeon Dr. Sharol Given.    Last cardiology evaluation was on 12/04/19 with Ahmed Prima, [place], PA-C. Edema had improved after switching form Lasix to torsemide. EF 60-65% with grade 1 DD on 2020 echo. Continue medication therapy for CAD. Six month follow-up planned.   She is on ASA 81 mg. It appears Plavix was started after RLE angioplasty 01/28/20. Plavix is currently listed as not taking due to change in therapy. No specific instructions found, but she did had admission for bleeding from her foot wound last week.   She is for COVID-19 presurgical testing on the day of surgery. She had labs on 04/23/19, but K was 2.9 (supplemented), so will enter ISTAT8 order. Anesthesia team to evaluate on arrival.   VS: Ht 5\' 7"  (1.702 m)   Wt 74.8 kg   BMI 25.84 kg/m   BP  Readings from Last 3 Encounters:  04/25/20 133/80  04/23/20 (!) 155/64  04/01/20 (!) 145/63   Pulse Readings from Last 3 Encounters:  04/25/20 (!) 53  04/23/20 88  04/01/20 80     PROVIDERS: Fayrene Helper, MD is PCP  Carlyle Dolly, MD is cardiologist Servando Snare, MD is vascular surgeon   LABS: Lab results from 04/23/19 ED/admission included: Lab Results  Component Value Date   WBC 14.9 (H) 04/22/2020   HGB 9.8 (L) 04/22/2020   HCT 31.4 (L) 04/22/2020   PLT 342 04/22/2020   GLUCOSE 153 (H) 04/22/2020   ALT 37 04/22/2020   AST 36 04/22/2020   NA 139 04/22/2020   K 2.9 (L) 04/22/2020   CL 106 04/22/2020   CREATININE 0.76 04/22/2020   BUN 9 04/22/2020   CO2 21 (L) 04/22/2020   TSH 1.28 01/10/2020   INR 1.3 (H) 04/22/2020   HGBA1C 6.1 (H) 12/22/2019   KCL was supplemented during hospitalization. Will order an ISTAT8 on arrival to re-evaluate hypokalemia.     EKG: 04/22/20: Sinus tachycardia at 117 bpm Probable left atrial enlargement Probable LVH with secondary repol abnrm Inferior infarct, old Baseline wander in lead(s) I III aVL Since last tracing Rate faster Confirmed by Calvert Cantor [phone]) on 04/22/2020 1:09:59 PM   CV: Echo 10/17/19: IMPRESSIONS  1. Left ventricular ejection fraction, by estimation, is 60 to 65%. The  left ventricle has normal function. The left ventricle has no regional  wall motion abnormalities. There is mild left ventricular hypertrophy.  Left ventricular diastolic parameters  are consistent with Grade I diastolic dysfunction (impaired relaxation).  Elevated left atrial pressure.  2. Right ventricular systolic function is normal. The right ventricular  size is normal.  3. The mitral valve is normal in structure. No evidence of mitral valve  regurgitation. No evidence of mitral stenosis.  4. The aortic valve has an indeterminant number of cusps. Aortic valve  regurgitation is mild. Mild to moderate aortic valve   sclerosis/calcification is present, without any evidence of aortic  stenosis.  5. The inferior vena cava is normal in size with greater than 50%  respiratory variability, suggesting right atrial pressure of 3 mmHg.    Cardiac cath 07/24/03: IMPRESSION:  1. Normal left ventricular systolic function.  2. Three-vessel coronary artery disease, as described.  The culprit lesion     appears to be the 90% stenosis in the first obtuse marginal branch.     There is also significant disease in the distal LAD and moderate, but     nonobstructive, disease in the small right coronary artery. PCI: 1. PTCA with placement of a drug-eluting stent in the first obtuse marginal     branch. 2. PTCA with placement of a bare metal stent in the distal left anterior     descending artery.   Past Medical History:  Diagnosis Date  . AKI (acute kidney injury) ([place]) 09/22/2017  . Anemia   . 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 Arthritis   . CHF (congestive heart failure) ([place]) 10/02/2019  . Colonic polyp 2010   Hemorrhoids; h/o mild hematochezia  . CVA (cerebral infarction) 08/2008   sizable left -residual expressive aphasia, right sided weakness; ambulates with difficulty with the right leg brace   . Hyperlipidemia   . Hypertension 2005  . Osteoporosis 03/28/2020  . Rheumatoid arthritis(714.0)   . Stroke [facility])     Past Surgical History:  Procedure Laterality Date  . ABDOMINAL AORTOGRAM W/LOWER EXTREMITY Bilateral 01/28/2020   Procedure: ABDOMINAL AORTOGRAM W/LOWER EXTREMITY;  Surgeon: Waynetta Sandy, MD;  Location: [place] [facility];  Service: Cardiovascular;  Laterality: Bilateral;  . ANKLE SURGERY     Right  . CAROTID STENT INSERTION    . COLONOSCOPY W/ POLYPECTOMY  11/2008   Dr. Gala Romney. Left-sided diverticula, Pedunculated polyp snared (no adenomatous changes)  . COLONOSCOPY WITH PROPOFOL N/A 01/12/2018    Procedure: COLONOSCOPY WITH PROPOFOL;  Surgeon: Daneil Dolin, MD;  [facility];  Service: Endoscopy;  Laterality: N/A;  . FEMUR IM NAIL Right 09/23/2017   Procedure: INTRAMEDULLARY (IM) NAIL FEMORAL;  Surgeon: Renette Butters, MD;  Location: [place];  Service: Orthopedics;  Laterality: Right;  . FOOT SURGERY Right 01/31/2020  . FRACTURE SURGERY     Hip replacement in May 2019 - fall related   . OOPHORECTOMY    . PERIPHERAL VASCULAR BALLOON ANGIOPLASTY Right 01/28/2020   Procedure: PERIPHERAL VASCULAR BALLOON ANGIOPLASTY;  Surgeon: Waynetta Sandy, MD;  Location: [place] [facility];  Service: Cardiovascular;  Laterality: Right;  SFA    MEDICATIONS: No current facility-administered medications for this encounter.   Marland Kitchen acetaminophen (TYLENOL) 325 MG tablet  . amLODipine (NORVASC) 5 MG tablet  . aspirin EC 81 MG tablet  . Calcium-Magnesium-Vitamin D (CALCIUM 1200+D3 PO)  . Cholecalciferol (VITAMIN D3) 125 MCG (5000 UT) CAPS  . ezetimibe (ZETIA) 10 MG tablet  . fluticasone (FLONASE) 50 MCG/ACT nasal  spray  . HYDROcodone-acetaminophen (NORCO/VICODIN) 5-325 MG tablet  . hydroxychloroquine (PLAQUENIL) 200 MG tablet  . metoprolol succinate (TOPROL-XL) 25 MG 24 hr tablet  . montelukast (SINGULAIR) 10 MG tablet  . Multiple Vitamin (MULTIVITAMIN WITH MINERALS) TABS tablet  . nicotine (NICODERM CQ - DOSED IN MG/24 HOURS) 14 mg/24hr patch  . oxybutynin (DITROPAN) 5 MG tablet  . pantoprazole (PROTONIX) 40 MG tablet  . potassium chloride SA (KLOR-CON) 20 MEQ tablet  . rosuvastatin (CRESTOR) 20 MG tablet  . sulfamethoxazole-trimethoprim (BACTRIM DS) 800-160 MG tablet  . torsemide (DEMADEX) 20 MG tablet  . clopidogrel (PLAVIX) 75 MG tablet  . UNABLE TO FIND  . UNABLE TO FIND    Myra Gianotti, PA-C Surgical Short Stay/Anesthesiology [facility] Phone [phone] [facility] Phone [phone] 04/29/2020 1:46 PM

## 2020-04-29 NOTE — Anesthesia Preprocedure Evaluation (Addendum)
Anesthesia Evaluation    Airway Mallampati: I  TM Distance: >3 FB Neck ROM: Full    Dental  (+) Edentulous Upper, Edentulous Lower, Dental Advisory Given   Pulmonary Current Smoker,    Pulmonary exam normal        Cardiovascular hypertension, + CAD, + Cardiac Stents and + Peripheral Vascular Disease  Normal cardiovascular exam  Last cardiology evaluation was on 12/04/19 with Ahmed Prima, [place], PA-C. Edema had improved after switching form Lasix to torsemide. EF 60-65% with grade 1 DD on 2020 echo. Continue medication therapy for CAD. Six month follow-up planned.   CV: Echo 10/17/19: IMPRESSIONS  1. Left ventricular ejection fraction, by estimation, is 60 to 65%. The  left ventricle has normal function. The left ventricle has no regional  wall motion abnormalities. There is mild left ventricular hypertrophy.  Left ventricular diastolic parameters  are consistent with Grade I diastolic dysfunction (impaired relaxation).  Elevated left atrial pressure.  2. Right ventricular systolic function is normal. The right ventricular  size is normal.  3. The mitral valve is normal in structure. No evidence of mitral valve  regurgitation. No evidence of mitral stenosis.  4. The aortic valve has an indeterminant number of cusps. Aortic valve  regurgitation is mild. Mild to moderate aortic valve  sclerosis/calcification is present, without any evidence of aortic  stenosis.  5. The inferior vena cava is normal in size with greater than 50%  respiratory variability, suggesting right atrial pressure of 3 mmHg.     Neuro/Psych Nonverbal CVA, Residual Symptoms    GI/Hepatic negative GI ROS, Neg liver ROS,   Endo/Other  negative endocrine ROS  Renal/GU negative Renal ROS     Musculoskeletal   Abdominal   Peds  Hematology  (+) anemia ,   Anesthesia Other Findings   Reproductive/Obstetrics                             Anesthesia Physical Anesthesia Plan  ASA: III  Anesthesia Plan: General   Post-op Pain Management:    Induction: Intravenous  PONV Risk Score and Plan: 3 and Ondansetron and Dexamethasone  Airway Management Planned: LMA  Additional Equipment:   Intra-op Plan:   Post-operative Plan: Extubation in OR  Informed Consent: I have reviewed the patients History and Physical, chart, labs and discussed the procedure including the risks, benefits and alternatives for the proposed anesthesia with the patient or authorized representative who has indicated his/her understanding and acceptance.     Dental advisory given and Consent reviewed with POA  Plan Discussed with: CRNA and Anesthesiologist  Anesthesia Plan Comments: (PAT note written 04/29/2020 by Myra Gianotti, PA-C. Has aphasia following CVA. Cardiologist is Dr. Harl Bowie. Had labs on 04/22/20 with K 2.9 (supplemented), so ISTAT8 entered for DOS. )       Anesthesia Quick Evaluation

## 2020-04-30 ENCOUNTER — Other Ambulatory Visit: Payer: Self-pay

## 2020-04-30 ENCOUNTER — Encounter (HOSPITAL_COMMUNITY): Payer: Self-pay | Admitting: Orthopedic Surgery

## 2020-04-30 ENCOUNTER — Inpatient Hospital Stay (HOSPITAL_COMMUNITY): Payer: Medicare Other | Admitting: Vascular Surgery

## 2020-04-30 ENCOUNTER — Inpatient Hospital Stay (HOSPITAL_COMMUNITY)
Admission: RE | Admit: 2020-04-30 | Discharge: 2020-05-05 | DRG: 240 | Disposition: A | Discharge status: Home Health | Payer: Medicare Other | Attending: Orthopedic Surgery | Admitting: Orthopedic Surgery

## 2020-04-30 ENCOUNTER — Encounter (HOSPITAL_COMMUNITY)
Admission: RE | Disposition: A | Discharge status: Home Health | Payer: Self-pay | Source: Home / Self Care | Attending: Orthopedic Surgery

## 2020-04-30 DIAGNOSIS — F17210 Nicotine dependence, cigarettes, uncomplicated: Secondary | ICD-10-CM | POA: Diagnosis present

## 2020-04-30 DIAGNOSIS — I110 Hypertensive heart disease with heart failure: Secondary | ICD-10-CM | POA: Diagnosis present

## 2020-04-30 DIAGNOSIS — D62 Acute posthemorrhagic anemia: Secondary | ICD-10-CM | POA: Diagnosis not present

## 2020-04-30 DIAGNOSIS — E785 Hyperlipidemia, unspecified: Secondary | ICD-10-CM | POA: Diagnosis present

## 2020-04-30 DIAGNOSIS — Z803 Family history of malignant neoplasm of breast: Secondary | ICD-10-CM

## 2020-04-30 DIAGNOSIS — Z807 Family history of other malignant neoplasms of lymphoid, hematopoietic and related tissues: Secondary | ICD-10-CM

## 2020-04-30 DIAGNOSIS — Z8249 Family history of ischemic heart disease and other diseases of the circulatory system: Secondary | ICD-10-CM | POA: Diagnosis not present

## 2020-04-30 DIAGNOSIS — L97119 Non-pressure chronic ulcer of right thigh with unspecified severity: Secondary | ICD-10-CM | POA: Diagnosis not present

## 2020-04-30 DIAGNOSIS — I96 Gangrene, not elsewhere classified: Secondary | ICD-10-CM | POA: Diagnosis present

## 2020-04-30 DIAGNOSIS — I2510 Atherosclerotic heart disease of native coronary artery without angina pectoris: Secondary | ICD-10-CM | POA: Diagnosis present

## 2020-04-30 DIAGNOSIS — I69351 Hemiplegia and hemiparesis following cerebral infarction affecting right dominant side: Secondary | ICD-10-CM

## 2020-04-30 DIAGNOSIS — Z955 Presence of coronary angioplasty implant and graft: Secondary | ICD-10-CM | POA: Diagnosis not present

## 2020-04-30 DIAGNOSIS — I70261 Atherosclerosis of native arteries of extremities with gangrene, right leg: Principal | ICD-10-CM | POA: Diagnosis present

## 2020-04-30 DIAGNOSIS — I69320 Aphasia following cerebral infarction: Secondary | ICD-10-CM | POA: Diagnosis not present

## 2020-04-30 DIAGNOSIS — L97518 Non-pressure chronic ulcer of other part of right foot with other specified severity: Secondary | ICD-10-CM | POA: Diagnosis present

## 2020-04-30 DIAGNOSIS — I509 Heart failure, unspecified: Secondary | ICD-10-CM | POA: Diagnosis present

## 2020-04-30 DIAGNOSIS — L97218 Non-pressure chronic ulcer of right calf with other specified severity: Secondary | ICD-10-CM | POA: Diagnosis present

## 2020-04-30 DIAGNOSIS — Z20822 Contact with and (suspected) exposure to covid-19: Secondary | ICD-10-CM | POA: Diagnosis present

## 2020-04-30 DIAGNOSIS — I5032 Chronic diastolic (congestive) heart failure: Secondary | ICD-10-CM | POA: Diagnosis not present

## 2020-04-30 DIAGNOSIS — M069 Rheumatoid arthritis, unspecified: Secondary | ICD-10-CM | POA: Diagnosis present

## 2020-04-30 HISTORY — PX: AMPUTATION: SHX166

## 2020-04-30 HISTORY — PX: ABOVE KNEE LEG AMPUTATION: SUR20

## 2020-04-30 LAB — POCT I-STAT, CHEM 8
BUN: 11 mg/dL (ref 8–23)
Calcium, Ion: 1.2 mmol/L (ref 1.15–1.40)
Chloride: 108 mmol/L (ref 98–111)
Creatinine, Ser: 1 mg/dL (ref 0.44–1.00)
Glucose, Bld: 83 mg/dL (ref 70–99)
HCT: 31 % — ABNORMAL LOW (ref 36.0–46.0)
Hemoglobin: 10.5 g/dL — ABNORMAL LOW (ref 12.0–15.0)
Potassium: 4 mmol/L (ref 3.5–5.1)
Sodium: 138 mmol/L (ref 135–145)
TCO2: 22 mmol/L (ref 22–32)

## 2020-04-30 LAB — SARS CORONAVIRUS 2 BY RT PCR (HOSPITAL ORDER, PERFORMED IN ~~LOC~~ HOSPITAL LAB): SARS Coronavirus 2: NEGATIVE

## 2020-04-30 SURGERY — AMPUTATION, ABOVE KNEE
Anesthesia: General | Site: Knee | Laterality: Right

## 2020-04-30 MED ORDER — METOPROLOL SUCCINATE ER 25 MG PO TB24
25.0000 mg | ORAL_TABLET | Freq: Every day | ORAL | Status: DC
  Administered 2020-05-01 – 2020-05-05 (×5): 25 mg via ORAL
  Filled 2020-04-30 (×5): qty 1

## 2020-04-30 MED ORDER — STERILE WATER FOR IRRIGATION IR SOLN
Status: DC | PRN
  Administered 2020-04-30: 1000 mL

## 2020-04-30 MED ORDER — MIDAZOLAM HCL 2 MG/2ML IJ SOLN
INTRAMUSCULAR | Status: AC
  Filled 2020-04-30: qty 2

## 2020-04-30 MED ORDER — AMLODIPINE BESYLATE 5 MG PO TABS
5.0000 mg | ORAL_TABLET | Freq: Every day | ORAL | Status: DC
  Administered 2020-05-01 – 2020-05-05 (×5): 5 mg via ORAL
  Filled 2020-04-30 (×5): qty 1

## 2020-04-30 MED ORDER — ORAL CARE MOUTH RINSE: 15.0000 mL | Freq: Once | OROMUCOSAL | Status: AC

## 2020-04-30 MED ORDER — HYDROXYCHLOROQUINE SULFATE 200 MG PO TABS
200.0000 mg | ORAL_TABLET | ORAL | Status: DC
  Administered 2020-04-30 – 2020-05-03 (×2): 200 mg via ORAL
  Filled 2020-04-30 (×2): qty 1

## 2020-04-30 MED ORDER — TORSEMIDE 10 MG PO TABS
10.0000 mg | ORAL_TABLET | Freq: Every day | ORAL | Status: DC
  Administered 2020-05-01 – 2020-05-05 (×5): 10 mg via ORAL
  Filled 2020-04-30 (×5): qty 1

## 2020-04-30 MED ORDER — CEFAZOLIN SODIUM-DEXTROSE 1-4 GM/50ML-% IV SOLN
1.0000 g | Freq: Four times a day (QID) | INTRAVENOUS | Status: AC
  Administered 2020-04-30 – 2020-05-01 (×3): 1 g via INTRAVENOUS
  Filled 2020-04-30 (×2): qty 50

## 2020-04-30 MED ORDER — HYDROMORPHONE HCL 1 MG/ML IJ SOLN
INTRAMUSCULAR | Status: AC
  Filled 2020-04-30: qty 1

## 2020-04-30 MED ORDER — MIDAZOLAM HCL 5 MG/5ML IJ SOLN
INTRAMUSCULAR | Status: DC | PRN
  Administered 2020-04-30: 1 mg via INTRAVENOUS

## 2020-04-30 MED ORDER — MONTELUKAST SODIUM 10 MG PO TABS
10.0000 mg | ORAL_TABLET | Freq: Every day | ORAL | Status: DC
Start: 2020-04-30 — End: 2020-05-05
  Administered 2020-04-30 – 2020-05-04 (×5): 10 mg via ORAL
  Filled 2020-04-30 (×5): qty 1

## 2020-04-30 MED ORDER — ROSUVASTATIN CALCIUM 20 MG PO TABS
20.0000 mg | ORAL_TABLET | ORAL | Status: DC
  Administered 2020-04-30 – 2020-05-04 (×3): 20 mg via ORAL
  Filled 2020-04-30 (×3): qty 1

## 2020-04-30 MED ORDER — CEFAZOLIN SODIUM-DEXTROSE 2-4 GM/100ML-% IV SOLN
2.0000 g | INTRAVENOUS | Status: AC
  Administered 2020-04-30: 2 g via INTRAVENOUS
  Filled 2020-04-30: qty 100

## 2020-04-30 MED ORDER — FLUTICASONE PROPIONATE 50 MCG/ACT NA SUSP
1.0000 | Freq: Every day | NASAL | Status: DC | PRN
  Filled 2020-04-30: qty 16

## 2020-04-30 MED ORDER — ONDANSETRON HCL 4 MG PO TABS: 4.0000 mg | ORAL_TABLET | Freq: Four times a day (QID) | ORAL | Status: DC | PRN

## 2020-04-30 MED ORDER — FENTANYL CITRATE (PF) 250 MCG/5ML IJ SOLN
INTRAMUSCULAR | Status: AC
  Filled 2020-04-30: qty 5

## 2020-04-30 MED ORDER — METOCLOPRAMIDE HCL 5 MG/ML IJ SOLN: 5.0000 mg | Freq: Three times a day (TID) | INTRAMUSCULAR | Status: DC | PRN

## 2020-04-30 MED ORDER — FENTANYL CITRATE (PF) 250 MCG/5ML IJ SOLN
INTRAMUSCULAR | Status: DC | PRN
  Administered 2020-04-30: 100 ug via INTRAVENOUS
  Administered 2020-04-30: 50 ug via INTRAVENOUS

## 2020-04-30 MED ORDER — PANTOPRAZOLE SODIUM 40 MG PO TBEC
40.0000 mg | DELAYED_RELEASE_TABLET | Freq: Every day | ORAL | Status: DC
  Administered 2020-05-01 – 2020-05-05 (×5): 40 mg via ORAL
  Filled 2020-04-30 (×5): qty 1

## 2020-04-30 MED ORDER — LIDOCAINE 2% (20 MG/ML) 5 ML SYRINGE
INTRAMUSCULAR | Status: DC | PRN
  Administered 2020-04-30: 50 mg via INTRAVENOUS

## 2020-04-30 MED ORDER — POTASSIUM CHLORIDE CRYS ER 20 MEQ PO TBCR
20.0000 meq | EXTENDED_RELEASE_TABLET | Freq: Every day | ORAL | Status: DC
  Administered 2020-04-30 – 2020-05-05 (×6): 20 meq via ORAL
  Filled 2020-04-30 (×7): qty 1

## 2020-04-30 MED ORDER — METOCLOPRAMIDE HCL 5 MG PO TABS: 5.0000 mg | ORAL_TABLET | Freq: Three times a day (TID) | ORAL | Status: DC | PRN

## 2020-04-30 MED ORDER — HYDROXYCHLOROQUINE SULFATE 200 MG PO TABS
200.0000 mg | ORAL_TABLET | ORAL | Status: DC
  Filled 2020-04-30: qty 1

## 2020-04-30 MED ORDER — EZETIMIBE 10 MG PO TABS
10.0000 mg | ORAL_TABLET | Freq: Every day | ORAL | Status: DC
  Administered 2020-05-01 – 2020-05-05 (×5): 10 mg via ORAL
  Filled 2020-04-30 (×5): qty 1

## 2020-04-30 MED ORDER — HYDROMORPHONE HCL 1 MG/ML IJ SOLN
0.5000 mg | INTRAMUSCULAR | Status: DC | PRN
  Administered 2020-04-30 – 2020-05-01 (×2): 0.5 mg via INTRAVENOUS
  Filled 2020-04-30: qty 0.5

## 2020-04-30 MED ORDER — DOCUSATE SODIUM 100 MG PO CAPS
100.0000 mg | ORAL_CAPSULE | Freq: Two times a day (BID) | ORAL | Status: DC
  Administered 2020-04-30 – 2020-05-05 (×10): 100 mg via ORAL
  Filled 2020-04-30 (×11): qty 1

## 2020-04-30 MED ORDER — ONDANSETRON HCL 4 MG/2ML IJ SOLN: 4.0000 mg | Freq: Four times a day (QID) | INTRAMUSCULAR | Status: DC | PRN

## 2020-04-30 MED ORDER — ASPIRIN EC 81 MG PO TBEC
81.0000 mg | DELAYED_RELEASE_TABLET | Freq: Every day | ORAL | Status: DC
Start: 2020-04-30 — End: 2020-05-05
  Administered 2020-04-30 – 2020-05-05 (×6): 81 mg via ORAL
  Filled 2020-04-30 (×6): qty 1

## 2020-04-30 MED ORDER — PROPOFOL 10 MG/ML IV BOLUS
INTRAVENOUS | Status: DC | PRN
  Administered 2020-04-30: 100 mg via INTRAVENOUS

## 2020-04-30 MED ORDER — OXYCODONE HCL 5 MG PO TABS
5.0000 mg | ORAL_TABLET | ORAL | Status: DC | PRN
  Administered 2020-04-30 (×2): 5 mg via ORAL
  Administered 2020-05-01: 10 mg via ORAL
  Filled 2020-04-30: qty 2
  Filled 2020-04-30: qty 1
  Filled 2020-04-30 (×2): qty 2

## 2020-04-30 MED ORDER — OXYBUTYNIN CHLORIDE 5 MG PO TABS
2.5000 mg | ORAL_TABLET | Freq: Two times a day (BID) | ORAL | Status: DC
  Administered 2020-04-30 – 2020-05-05 (×10): 2.5 mg via ORAL
  Filled 2020-04-30 (×12): qty 0.5

## 2020-04-30 MED ORDER — CHLORHEXIDINE GLUCONATE 0.12 % MT SOLN: 15.0000 mL | Freq: Once | OROMUCOSAL | Status: AC

## 2020-04-30 MED ORDER — SODIUM CHLORIDE 0.9 % IV SOLN: INTRAVENOUS | Status: DC

## 2020-04-30 MED ORDER — CLOPIDOGREL BISULFATE 75 MG PO TABS: 75.0000 mg | ORAL_TABLET | Freq: Every day | ORAL | Status: DC

## 2020-04-30 MED ORDER — CHLORHEXIDINE GLUCONATE 0.12 % MT SOLN
OROMUCOSAL | Status: AC
  Administered 2020-04-30: 15 mL via OROMUCOSAL
  Filled 2020-04-30: qty 15

## 2020-04-30 MED ORDER — LACTATED RINGERS IV SOLN: INTRAVENOUS | Status: DC

## 2020-04-30 MED ORDER — ACETAMINOPHEN 325 MG PO TABS: 650.0000 mg | ORAL_TABLET | Freq: Four times a day (QID) | ORAL | Status: DC | PRN

## 2020-04-30 MED ORDER — 0.9 % SODIUM CHLORIDE (POUR BTL) OPTIME
TOPICAL | Status: DC | PRN
  Administered 2020-04-30: 1000 mL

## 2020-04-30 SURGICAL SUPPLY — 44 items
BLADE SAW RECIP 87.9 MT (BLADE) ×2 IMPLANT
BLADE SURG 21 STRL SS (BLADE) ×2 IMPLANT
BNDG COHESIVE 6X5 TAN STRL LF (GAUZE/BANDAGES/DRESSINGS) ×2 IMPLANT
BUR DISC 0.8X25 (BURR) ×1 IMPLANT
CANISTER WOUND CARE 500ML ATS (WOUND CARE) ×1 IMPLANT
COVER SURGICAL LIGHT HANDLE (MISCELLANEOUS) ×2 IMPLANT
COVER WAND RF STERILE (DRAPES) IMPLANT
CUFF TOURN SGL QUICK 34 (TOURNIQUET CUFF)
CUFF TRNQT CYL 34X4.125X (TOURNIQUET CUFF) IMPLANT
DRAPE DERMATAC (DRAPES) ×2 IMPLANT
DRAPE INCISE IOBAN 66X45 STRL (DRAPES) ×4 IMPLANT
DRAPE U-SHAPE 47X51 STRL (DRAPES) ×2 IMPLANT
DRESSING PREVENA PLUS CUSTOM (GAUZE/BANDAGES/DRESSINGS) ×1 IMPLANT
DRSG PREVENA PLUS CUSTOM (GAUZE/BANDAGES/DRESSINGS) ×2
DURAPREP 26ML APPLICATOR (WOUND CARE) ×2 IMPLANT
ELECT REM PT RETURN 9FT ADLT (ELECTROSURGICAL) ×2
ELECTRODE REM PT RTRN 9FT ADLT (ELECTROSURGICAL) ×1 IMPLANT
GLOVE BIOGEL PI IND STRL 7.5 (GLOVE) ×1 IMPLANT
GLOVE BIOGEL PI IND STRL 9 (GLOVE) ×1 IMPLANT
GLOVE BIOGEL PI INDICATOR 7.5 (GLOVE) ×1
GLOVE BIOGEL PI INDICATOR 9 (GLOVE) ×1
GLOVE SURG ORTHO 9.0 STRL STRW (GLOVE) ×2 IMPLANT
GLOVE SURG SS PI 6.5 STRL IVOR (GLOVE) ×2 IMPLANT
GOWN STRL REUS W/ TWL LRG LVL3 (GOWN DISPOSABLE) ×1 IMPLANT
GOWN STRL REUS W/ TWL XL LVL3 (GOWN DISPOSABLE) ×2 IMPLANT
GOWN STRL REUS W/TWL LRG LVL3 (GOWN DISPOSABLE) ×2
GOWN STRL REUS W/TWL XL LVL3 (GOWN DISPOSABLE) ×4
KIT BASIN OR (CUSTOM PROCEDURE TRAY) ×2 IMPLANT
KIT TURNOVER KIT B (KITS) ×2 IMPLANT
MANIFOLD NEPTUNE II (INSTRUMENTS) ×2 IMPLANT
NS IRRIG 1000ML POUR BTL (IV SOLUTION) ×2 IMPLANT
PACK ORTHO EXTREMITY (CUSTOM PROCEDURE TRAY) ×2 IMPLANT
PAD ARMBOARD 7.5X6 YLW CONV (MISCELLANEOUS) ×2 IMPLANT
PREVENA RESTOR ARTHOFORM 46X30 (CANNISTER) ×2 IMPLANT
SPONGE LAP 18X18 RF (DISPOSABLE) ×1 IMPLANT
STAPLER VISISTAT 35W (STAPLE) ×1 IMPLANT
STOCKINETTE IMPERVIOUS LG (DRAPES) ×1 IMPLANT
SUT ETHILON 2 0 PSLX (SUTURE) ×6 IMPLANT
SUT SILK 2 0 (SUTURE) ×2
SUT SILK 2-0 18XBRD TIE 12 (SUTURE) ×1 IMPLANT
SUT VIC AB 1 CTX 27 (SUTURE) ×2 IMPLANT
TOWEL GREEN STERILE FF (TOWEL DISPOSABLE) ×2 IMPLANT
TUBE CONNECTING 20X1/4 (TUBING) ×2 IMPLANT
YANKAUER SUCT BULB TIP NO VENT (SUCTIONS) ×2 IMPLANT

## 2020-04-30 NOTE — Progress Notes (Signed)
Orthopedic Tech Progress Note Patient Details:  Tajha C Starlin 17-Dec-1955 [id number] Called in order to [facility] Patient ID: Anju C Lawes, female   DOB: 04/02/1956, 65 y.o.   MRN: [id number]   Chip Boer 04/30/2020, 3:01 PM

## 2020-04-30 NOTE — H&P (Signed)
Danielle Cruz is an 65 y.o. female.   Chief Complaint: Right leg gangrene HPI: Patient is a 65 year old woman with peripheral vascular disease and a stroke involving the right upper and right lower extremity.  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  Patient most recently has been seen by vascular surgery and by report she was not very cooperative.  Recommendations were to proceed with amputation surgery.  Patient is also seeing Dr. Aline Cruz in [place] and recommendations are also to proceed with amputation surgery.  Past Medical History:  Diagnosis Date  . AKI (acute kidney injury) ([place]) 09/22/2017  . Anemia   . 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 Arthritis   . CHF (congestive heart failure) ([place]) 10/02/2019  . Colonic polyp 2010   Hemorrhoids; h/o mild hematochezia  . CVA (cerebral infarction) 08/2008   sizable left -residual expressive aphasia, right sided weakness; ambulates with difficulty with the right leg brace   . Hyperlipidemia   . Hypertension 2005  . Osteoporosis 03/28/2020  . Rheumatoid arthritis(714.0)   . Stroke [facility])     Past Surgical History:  Procedure Laterality Date  . ABDOMINAL AORTOGRAM W/LOWER EXTREMITY Bilateral 01/28/2020   Procedure: ABDOMINAL AORTOGRAM W/LOWER EXTREMITY;  Surgeon: Danielle Sandy, MD;  Location: [place] [facility];  Service: Cardiovascular;  Laterality: Bilateral;  . ANKLE SURGERY     Right  . CAROTID STENT INSERTION    . COLONOSCOPY W/ POLYPECTOMY  11/2008   Dr. Gala Cruz. Left-sided diverticula, Pedunculated polyp snared (no adenomatous changes)  . COLONOSCOPY WITH PROPOFOL N/A 01/12/2018   Procedure: COLONOSCOPY WITH PROPOFOL;  Surgeon: Daneil Dolin, MD;  [facility];  Service: Endoscopy;  Laterality: N/A;  . FEMUR IM NAIL Right 09/23/2017   Procedure: INTRAMEDULLARY (IM) NAIL FEMORAL;   Surgeon: Danielle Butters, MD;  Location: [place];  Service: Orthopedics;  Laterality: Right;  . FOOT SURGERY Right 01/31/2020  . FRACTURE SURGERY     Hip replacement in May 2019 - fall related   . OOPHORECTOMY    . PERIPHERAL VASCULAR BALLOON ANGIOPLASTY Right 01/28/2020   Procedure: PERIPHERAL VASCULAR BALLOON ANGIOPLASTY;  Surgeon: Danielle Sandy, MD;  Location: [place] [facility] LAB;  Service: Cardiovascular;  Laterality: Right;  SFA    Family History  Problem Relation Age of Onset  . Cancer Father        prostate, deceased 77s  . Breast cancer Sister        Deceased 70s  . Heart attack Mother        deceased 25, during childbirth  . Cancer Brother        lymphoma  . Colon cancer Maternal Aunt        older than age 28  . Liver disease Neg Hx    Social History:  reports that she has been smoking cigarettes. She has a 7.50 pack-year smoking history. She has never used smokeless tobacco. She reports that she does not drink alcohol and does not use drugs.  Allergies:  Allergies  Allergen Reactions  . Ace Inhibitors Cough    New daily cough since starting ACE    No medications prior to admission.    No results found for this or any previous visit (from the past 48 hour(s)). No results found.  Review of Systems  All other systems reviewed and are  negative.   Height 5\' 7"  (1.702 m), weight 74.8 kg. Physical Exam  Patient is alert, oriented, no adenopathy, well-dressed, normal affect, normal respiratory effort. Examination patient has progressive gangrenous ulceration of the mid aspect of the posterior calf on the right with a large necrotic ulcer over the dorsum of the right foot.  Patient has just stopped her Plavix and is currently on aspirin.  Review of the radiographs shows a long femoral nail for an intertrochanteric hip fracture that extends down to the proximal aspect of the metaphyseal flare of the right knee.  Patient has a stroke that involves the right  upper and right lower extremity with no active function of her right hand.  This will make it difficult for mobilization with therapy.Heart RRR Lungs Clear Assessment/Plan 1. Gangrene of right foot ([place])     Plan: Discussed with the patient recommendation to proceed with amputation surgery.  With the long femoral nail on the right foot intertrochanteric hip fracture feel that we could proceed with amputation just proximal to the metaphyseal flare of the right leg proceed with a long above-the-knee amputation.  Risk and benefits were discussed including risk of the wound not healing.  Anticipate patient will need discharge to skilled nursing.  Patient states she understands and wishes to proceed with surgery on Wednesday.   Bevely Palmer Olanda Downie, PA 04/30/2020, 6:35 AM

## 2020-04-30 NOTE — Telephone Encounter (Signed)
Pt sch for surgery today would have received call from hospital to advise when to stop medication.

## 2020-04-30 NOTE — Op Note (Signed)
04/30/2020  11:15 AM  PATIENT:  Danielle Cruz    PRE-OPERATIVE DIAGNOSIS:  Gangrene Right Leg  POST-OPERATIVE DIAGNOSIS:  Same  PROCEDURE:  RIGHT ABOVE KNEE AMPUTATION Removal of intramedullary nail right femur. Application of incisional wound VAC with arthroform and Prevena customizable   SURGEON:  [name], MD  PHYSICIAN ASSISTANT:None ANESTHESIA:   General  PREOPERATIVE INDICATIONS:  Danielle Cruz is a  65 y.o. female with a diagnosis of Gangrene Right Leg who failed conservative measures and elected for surgical management.    The risks benefits and alternatives were discussed with the patient preoperatively including but not limited to the risks of infection, bleeding, nerve injury, cardiopulmonary complications, the need for revision surgery, among others, and the patient was willing to proceed.  OPERATIVE IMPLANTS: Arthroform Prevena customizable wound VAC  @ENCIMAGES @  OPERATIVE FINDINGS: The distal 2 cm of the intramedullary nail were removed using a RemB saw  OPERATIVE PROCEDURE: Patient was brought the operating room underwent a general anesthetic.  After adequate levels anesthesia were obtained patient's right lower extremity was prepped using DuraPrep draped into a sterile field a timeout was called.  A fishmouth incision was made at the distal right thigh just proximal to the patella.  This was carried down to the femur.  Intermuscular septum was incised and the vascular bundle was clamped and suture ligated with 2-0 silk.  A reciprocating saw was used to make the distal femur cut.  A 21 blade knife was used to complete the amputation.  Patient had approximately 2 cm of the intramedullary nail that was prominent.  The RemB saw was used to cut through the intramedullary nail and removed the distal 2 cm of the IM nail.  The wound was irrigated with normal saline.  The deep fascial layers were closed using #1 Vicryl the skin was closed using 2-0 nylon and staples.  The  Prevena customizable and Arnell Sieving form wound VAC dressing was applied this had a good suction fit patient was taken the PACU in stable condition.   DISCHARGE PLANNING:  Antibiotic duration: 24-hour antibiotics  Weightbearing: Nonweightbearing on the right  Pain medication: Opioid pathway  Dressing care/ Wound VAC: Continue wound VAC for 1 week  Ambulatory devices: Walker  Discharge to: Anticipate discharge to inpatient versus outpatient rehab pending recommendations from therapy  Follow-up: In the office 1 week post operative.

## 2020-04-30 NOTE — Progress Notes (Signed)
Pt is not taking Plavix at home. Ok to [employer] order here per Dr. Sharol Given.   Onnie Boer, PharmD, BCIDP, AAHIVP, CPP Infectious Disease Pharmacist 04/30/2020 3:03 PM

## 2020-04-30 NOTE — Interval H&P Note (Signed)
History and Physical Interval Note:  04/30/2020 6:57 AM  Danielle Cruz  has presented today for surgery, with the diagnosis of Gangrene Right Leg.  The various methods of treatment have been discussed with the patient and family. After consideration of risks, benefits and other options for treatment, the patient has consented to  Procedure(s): RIGHT ABOVE KNEE AMPUTATION (Right) as a surgical intervention.  The patient's history has been reviewed, patient examined, no change in status, stable for surgery.  I have reviewed the patient's chart and labs.  Questions were answered to the patient's satisfaction.     [name]

## 2020-04-30 NOTE — Anesthesia Procedure Notes (Signed)
Procedure Name: LMA Insertion Date/Time: 04/30/2020 10:25 AM Performed by: Hoy Morn, CRNA Pre-anesthesia Checklist: Patient identified, Emergency Drugs available, Suction available and Patient being monitored Patient Re-evaluated:Patient Re-evaluated prior to induction Oxygen Delivery Method: Circle system utilized Preoxygenation: Pre-oxygenation with 100% oxygen Induction Type: IV induction Ventilation: Mask ventilation without difficulty LMA: LMA inserted LMA Size: 4.0 Number of attempts: 1 Airway Equipment and Method: Bite block Placement Confirmation: positive ETCO2 Tube secured with: Tape Dental Injury: Teeth and Oropharynx as per pre-operative assessment

## 2020-04-30 NOTE — Anesthesia Postprocedure Evaluation (Signed)
Anesthesia Post Note  Patient: Danielle Cruz  Procedure(s) Performed: RIGHT ABOVE KNEE AMPUTATION (Right Knee)     Patient location during evaluation: PACU Anesthesia Type: General Level of consciousness: sedated Pain management: pain level controlled Vital Signs Assessment: post-procedure vital signs reviewed and stable Respiratory status: spontaneous breathing and respiratory function stable Cardiovascular status: stable Postop Assessment: no apparent nausea or vomiting Anesthetic complications: no   No complications documented.  Last Vitals:  Vitals:   04/30/20 1153 04/30/20 1208  BP: 133/64 (!) 146/62  Pulse: 84 82  Resp: 16 19  Temp:    SpO2: 100% 100%    Last Pain: There were no vitals filed for this visit.               [name]

## 2020-04-30 NOTE — Transfer of Care (Signed)
Immediate Anesthesia Transfer of Care Note  Patient: Danielle Cruz  Procedure(s) Performed: RIGHT ABOVE KNEE AMPUTATION (Right Knee)  Patient Location: PACU  Anesthesia Type:General  Level of Consciousness: drowsy and patient cooperative  Airway & Oxygen Therapy: Patient Spontanous Breathing and Patient connected to face mask oxygen  Post-op Assessment: Report given to RN and Post -op Vital signs reviewed and stable  Post vital signs: Reviewed and stable  Last Vitals:  Vitals Value Taken Time  BP 150/66 04/30/20 1108  Temp 36.2 C 04/30/20 1108  Pulse 109 04/30/20 1114  Resp 21 04/30/20 1114  SpO2 100 % 04/30/20 1114  Vitals shown include unvalidated device data.  Last Pain: There were no vitals filed for this visit.       Complications: No complications documented.

## 2020-05-01 ENCOUNTER — Encounter (HOSPITAL_COMMUNITY): Payer: Self-pay | Admitting: Orthopedic Surgery

## 2020-05-01 LAB — CBC
HCT: 21.5 % — ABNORMAL LOW (ref 36.0–46.0)
Hemoglobin: 6.9 g/dL — CL (ref 12.0–15.0)
MCH: 33.5 pg (ref 26.0–34.0)
MCHC: 32.1 g/dL (ref 30.0–36.0)
MCV: 104.4 fL — ABNORMAL HIGH (ref 80.0–100.0)
Platelets: 244 10*3/uL (ref 150–400)
RBC: 2.06 MIL/uL — ABNORMAL LOW (ref 3.87–5.11)
RDW: 18.9 % — ABNORMAL HIGH (ref 11.5–15.5)
WBC: 13.4 10*3/uL — ABNORMAL HIGH (ref 4.0–10.5)
nRBC: 0.3 % — ABNORMAL HIGH (ref 0.0–0.2)

## 2020-05-01 LAB — PREPARE RBC (CROSSMATCH)

## 2020-05-01 LAB — HEMOGLOBIN AND HEMATOCRIT, BLOOD
HCT: 30.4 % — ABNORMAL LOW (ref 36.0–46.0)
Hemoglobin: 9.8 g/dL — ABNORMAL LOW (ref 12.0–15.0)

## 2020-05-01 MED ORDER — PROPOFOL 10 MG/ML IV BOLUS
INTRAVENOUS | Status: AC
  Filled 2020-05-01: qty 40

## 2020-05-01 MED ORDER — SODIUM CHLORIDE 0.9% IV SOLUTION: Freq: Once | INTRAVENOUS | Status: DC

## 2020-05-01 MED ORDER — ENSURE MAX PROTEIN PO LIQD
11.0000 [oz_av] | Freq: Every day | ORAL | Status: DC
  Administered 2020-05-01 – 2020-05-02 (×2): 11 [oz_av] via ORAL
  Filled 2020-05-01 (×2): qty 330

## 2020-05-01 MED ORDER — ACETAMINOPHEN 325 MG PO TABS: 650.0000 mg | ORAL_TABLET | Freq: Four times a day (QID) | ORAL | Status: DC | PRN

## 2020-05-01 MED ORDER — OXYCODONE HCL 5 MG PO TABS: 5.0000 mg | ORAL_TABLET | ORAL | 0 refills | Status: DC | PRN

## 2020-05-01 NOTE — Progress Notes (Signed)
Patient ID: Danielle Cruz, female   DOB: 1956/03/30, 65 y.o.   MRN: [id number] Patient is postoperative day 1 right above-the-knee amputation.  Patient's hemoglobin has dropped to 6.9 due to acute blood loss anemia.  We will type and cross and transfuse with 1 unit of packed red blood cells.  Anticipate discharge to skilled nursing.  Patient's husband was not with her in the room this morning.

## 2020-05-01 NOTE — Evaluation (Signed)
Physical Therapy Evaluation Patient Details Name: Danielle Cruz MRN: [id number] DOB: [birth year] Today's Date: 05/01/2020   History of Present Illness  65 year old woman with peripheral vascular disease and a stroke involving the right upper and right lower extremity.  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 05/01/20 post-op Hgb 6.9 and to get transfusion  Clinical Impression   Patient is s/p above surgery resulting in functional limitations due to the deficits listed below (see PT Problem List). Patient was non-ambulatory and using a power-chair PTA. Husband or caregiver would help her transfer via stand-pivot on her LLE due to RLE pain/wounds. Currently pt in too much pain (despite pre-medication) to fully participate in evaluation of her mobility EOB or OOB. Patient will benefit from skilled PT to increase their independence and safety with mobility to allow discharge to the venue listed below.       Follow Up Recommendations Supervision/Assistance - 24 hour;SNF (if family refuses SNF, max support at home recommended)    Equipment Recommendations  None recommended by PT    Recommendations for Other Services       Precautions / Restrictions Precautions Precautions: Fall Precaution Comments: wound vac in place Restrictions Weight Bearing Restrictions: Yes RLE Weight Bearing: Non weight bearing Other Position/Activity Restrictions: patient with severe CVA in remote past with R U&LE deficits noted.      Mobility  Bed Mobility Overal bed mobility: Needs Assistance Bed Mobility: Rolling Rolling: Max assist         General bed mobility comments: pt using LUE on rails (to either Cruz); assisted nursing with cleaning pt and changing sheets    Transfers                 General transfer comment: pt declined.  started yelling out and crying. RN in to assist and agreed to provide  additional pain meds  Ambulation/Gait             General Gait Details: nonambulatory at baseline due to RLE wounds, uses power WC per spouse  Stairs            Wheelchair Mobility    Modified Rankin (Stroke Patients Only)       Balance                                             Pertinent Vitals/Pain Pain Assessment: Faces Faces Pain Scale: Hurts whole lot Pain Location: R LE Pain Descriptors / Indicators: Grimacing;Guarding;Crying;Moaning Pain Intervention(s): Limited activity within patient's tolerance;Monitored during session;Premedicated before session;Repositioned;Other (comment) (RN made aware)    Home Living Family/patient expects to be discharged to:: Private residence Living Arrangements: Spouse/significant other;Children Available Help at Discharge: Family;Personal care attendant;Available PRN/intermittently Type of Home: House Home Access: Ramped entrance     Home Layout: One level Home Equipment: Hospital bed;Bedside commode;Wheelchair - power;Walker - 2 wheels;Hand held shower head;Tub bench;Wheelchair - manual Additional Comments: info updated by OT as spouse present during their session    Prior Function Level of Independence: Needs assistance   Gait / Transfers Assistance Needed: Spouse and PCA (M-F) assists with transfers.  Prior to wounds to her foot, the patient could take steps with hemiwalker and [employer].  Limited to SPT over the last month.  ADL's / Homemaking Assistance Needed: Aide assists with dressing,  sponge baths, cook meals, laundry, toileting.  Daughter also assists with bathing and dressing.  Per the spouse she can wash her face and feed herself.  Comments: Caretakers Monday-Friday 8am-3pm     Hand Dominance   Dominant Hand: Left    Extremity/Trunk Assessment   Upper Extremity Assessment Upper Extremity Assessment: Defer to OT evaluation     Lower Extremity Assessment Lower Extremity Assessment:  RLE deficits/detail;LLE deficits/detail RLE Deficits / Details: new AKA RLE: Unable to fully assess due to pain LLE Deficits / Details: AROM WNL       Communication   Communication: Expressive difficulties  Cognition Arousal/Alertness: Awake/alert Behavior During Therapy: Anxious Overall Cognitive Status: Difficult to assess                                 General Comments: Pt able to follow single step commands appropriately, shakes head yes/no appropriatley to questions      General Comments General comments (skin integrity, edema, etc.): Pt asking for dry erase board and repeatedly wrote "Danielle Cruz" and then pointed to herself.    Exercises     Assessment/Plan    PT Assessment Patient needs continued PT services  PT Problem List Decreased strength;Decreased range of motion;Decreased activity tolerance;Decreased balance;Decreased mobility;Decreased cognition;Decreased skin integrity;Pain;Decreased knowledge of use of DME       PT Treatment Interventions DME instruction;Gait training;Functional mobility training;Therapeutic activities;Therapeutic exercise;Balance training;Neuromuscular re-education;Patient/family education    PT Goals (Current goals can be found in the Care Plan section)  Acute Rehab PT Goals Patient Stated Goal: Spouse hopes to get her back home (per OT eval) PT Goal Formulation: Patient unable to participate in goal setting Time For Goal Achievement: 05/15/20 Potential to Achieve Goals: Fair    Frequency Min 2X/week   Barriers to discharge        Co-evaluation               AM-PAC PT "6 Clicks" Mobility  Outcome Measure Help needed turning from your back to your Cruz while in a flat bed without using bedrails?: Total Help needed moving from lying on your back to sitting on the Cruz of a flat bed without using bedrails?: Total Help needed moving to and from a bed to a chair (including a wheelchair)?: Total Help needed standing up  from a chair using your arms (e.g., wheelchair or bedside chair)?: Total Help needed to walk in hospital room?: Total Help needed climbing 3-5 steps with a railing? : Total 6 Click Score: 6    End of Session   Activity Tolerance: Patient limited by pain Patient left: in bed;with call bell/phone within reach;with bed alarm set Nurse Communication: Mobility status;Other (comment) (in pain (cannot request meds)) PT Visit Diagnosis: Other abnormalities of gait and mobility (R26.89);Muscle weakness (generalized) (M62.81);Pain Pain - Right/Left: Right Pain - part of body: Leg    Time: 6222-9798 PT Time Calculation (min) (ACUTE ONLY): 24 min   Charges:   PT Evaluation $PT Eval Low Complexity: 1 Low           Arby Barrette, PT Pager [phone]   Rexanne Mano 05/01/2020, 4:59 PM

## 2020-05-01 NOTE — Progress Notes (Signed)
Call back received  from Dr Garnette Czech. MD requested for previous Hgb level and if patient was stable. I informed him patient was stable and Hgb lever pre surgery was 10.5. MD verbalized " Dr Sharol Given will be here in 2 hours time he will take care of it when he gets here.

## 2020-05-01 NOTE — Progress Notes (Signed)
Inpatient Rehabilitation Admissions Coordinator   Inpatient rehab consult received,. Note PT and OT evals pending as patient is receiving blood. I will follow up once those assessment complete.  Danne Baxter, RN, MSN Rehab Admissions Coordinator [phone] 05/01/2020 11:26 AM

## 2020-05-01 NOTE — Progress Notes (Signed)
CRITICAL VALUE ALERT  Critical Value:  Hgb 6.9  Date & Time Notied:  05/01/20  0334  Provider Notified: On call Provider  Orders Received/Actions taken: Paged, waiting for a call back

## 2020-05-01 NOTE — Progress Notes (Signed)
OT Cancellation Note  Patient Details Name: Trinidy C Lemaire MRN: [id number] DOB: 06-Apr-1956   Cancelled Treatment:     Nursing asked OT to hold this AM as patient is receiving blood.   Heidi Maclin 05/01/2020, 9:25 AM

## 2020-05-01 NOTE — Evaluation (Signed)
Occupational Therapy Evaluation Patient Details Name: Danielle Cruz MRN: [id number] DOB: 1955-12-27 Today's Date: 05/01/2020    History of Present Illness 65 year old woman with peripheral vascular disease and a stroke involving the right upper and right lower extremity.  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 05/01/20 post-op Hgb 6.9 and to get transfusion   Clinical Impression   Patient HGB up to 9.5, patient admitted for the above diagnosis and subsequent procedure.  Limited out of bed assessment due to patient refusing due to pain.  Spouse attempted to assist with patient sitting at edge of bed.  OT eventually was able to scoot her higher in the bed and position her for self feeding.  She is very hesitant regarding any attempted movement.  Unclear if she could participate with CIR, if pain lessens and she agrees, discharge recommendation could be adjusted, but, for now SNF.  OT indicated in the acute setting to maximize functional status in preparation of post acute rehab.      Follow Up Recommendations  SNF    Equipment Recommendations  None recommended by OT    Recommendations for Other Services       Precautions / Restrictions Precautions Precautions: Fall Precaution Comments: wound vac in place Restrictions Weight Bearing Restrictions: Yes RLE Weight Bearing: Non weight bearing Other Position/Activity Restrictions: patient with severe CVA in remote past with R U&LE deficits noted.      Mobility Bed Mobility Overal bed mobility: Needs Assistance             General bed mobility comments: Dep to scoot higher in bed    Transfers                 General transfer comment: pt declined.  started yelling out and crying.  spouse tried to intervene.    Balance                                           ADL either performed or assessed with clinical  judgement   ADL Overall ADL's : Needs assistance/impaired Eating/Feeding: Maximal assistance;Bed level   Grooming: Wash/dry hands;Wash/dry face;Maximal assistance;Bed level                               Functional mobility during ADLs: Total assistance;+2 for physical assistance General ADL Comments: Dep to scoot higher in the bed.     Vision Patient Visual Report: No change from baseline       Perception     Praxis      Pertinent Vitals/Pain Faces Pain Scale: Hurts whole lot Pain Location: R LE Pain Descriptors / Indicators: Grimacing;Guarding;Crying Pain Intervention(s): Limited activity within patient's tolerance     Hand Dominance Left   Extremity/Trunk Assessment Upper Extremity Assessment Upper Extremity Assessment: RUE deficits/detail RUE Deficits / Details: Patient with wrist support in place.  No functional use of R UE.  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 RUE Sensation: WNL RUE Coordination: WNL   Lower Extremity Assessment Lower Extremity Assessment: Defer to PT evaluation       Communication Communication Communication: Expressive difficulties   Cognition Arousal/Alertness: Awake/alert Behavior During Therapy: WFL for tasks assessed/performed Overall Cognitive Status: Difficult to assess  General Comments: Pt able to follow single step commands appropriately, shakes head yes/no appropriatley to questions   General Comments       Exercises     Shoulder Instructions      Home Living Family/patient expects to be discharged to:: Private residence Living Arrangements: Spouse/significant other;Children Available Help at Discharge: Family;Personal care attendant;Available PRN/intermittently Type of Home: House Home Access: Ramped entrance     Home Layout: One level     Bathroom Shower/Tub: Teacher, early years/pre: Standard Bathroom  Accessibility: No   Home Equipment: Hospital bed;Bedside commode;Wheelchair - power;Walker - 2 wheels;Hand held shower head;Tub bench;Wheelchair - manual   Additional Comments: Spouse in the room this afternoon.      Prior Functioning/Environment Level of Independence: Needs assistance  Gait / Transfers Assistance Needed: Spouse and PCA (M-F) assists with transfers.  Prior to wounds to her foot, the patient could take steps with hemiwalker and [employer].  Limited to SPT over the last month. ADL's / Homemaking Assistance Needed: Aide assists with dressing, sponge baths, cook meals, laundry, toileting.  Daughter also assists with bathing and dressing.  Per the spouse she can wash her face and feed herself. Communication / Swallowing Assistance Needed: patient is unable to mouth words.  She points and often says the same "word" for all answers. Comments: Caretakers Monday-Friday 8am-3pm        OT Problem List: Decreased strength;Decreased range of motion;Decreased activity tolerance;Impaired balance (sitting and/or standing);Decreased knowledge of precautions;Pain      OT Treatment/Interventions: Self-care/ADL training;DME and/or AE instruction;Balance training;Patient/family education;Therapeutic activities    OT Goals(Current goals can be found in the care plan section) Acute Rehab OT Goals Patient Stated Goal: Spouse hopes to get her back home. OT Goal Formulation: Patient unable to participate in goal setting Time For Goal Achievement: 05/15/20 ADL Goals Pt Will Perform Eating: Independently;sitting Pt Will Perform Grooming: sitting;with supervision Pt Will Transfer to Toilet: with mod assist;stand pivot transfer;bedside commode Additional ADL Goal #1: patient will perform supine to sit with EB elevated with Min A to increase independence with toileting.  OT Frequency: Min 2X/week   Barriers to D/C:    none       Co-evaluation              AM-PAC OT "6 Clicks" Daily  Activity     Outcome Measure Help from another person eating meals?: A Lot Help from another person taking care of personal grooming?: A Lot Help from another person toileting, which includes using toliet, bedpan, or urinal?: Total Help from another person bathing (including washing, rinsing, drying)?: Total Help from another person to put on and taking off regular upper body clothing?: A Lot Help from another person to put on and taking off regular lower body clothing?: Total 6 Click Score: 9   End of Session Nurse Communication: Other (comment) (patient trying to eat)  Activity Tolerance: Patient limited by pain Patient left: in bed;with call bell/phone within reach;with bed alarm set;with family/visitor present  OT Visit Diagnosis: Muscle weakness (generalized) (M62.81);Pain Pain - Right/Left: Right Pain - part of body: Leg                Time: 7341-9379 OT Time Calculation (min): 21 min Charges:  OT General Charges $OT Visit: 1 Visit OT Evaluation $OT Eval Moderate Complexity: 1 Mod  05/01/2020  Rich, OTR/L  Acute Rehabilitation Services  Office:  [phone]   Metta Clines 05/01/2020, 4:12 PM

## 2020-05-01 NOTE — Progress Notes (Signed)
PT Cancellation Note  Patient Details Name: Danielle Cruz MRN: [id number] DOB: July 30, 1955   Cancelled Treatment:    Reason Eval/Treat Not Completed: Patient not medically ready  Noted Hgb decr to 6.9 (from 10.5 pre-op) and pt to receive blood. Will wait for at least one unit to be completed and then attempt evaluation.    Arby Barrette, PT Pager [phone]  Rexanne Mano 05/01/2020, 9:50 AM

## 2020-05-01 NOTE — Progress Notes (Addendum)
Patient retaining urine and unable to void on her own. On call provider paged. Call back received from Dr Garnette Czech to do In and Out once.   05/01/20 0110  Urine Characteristics  Urinary Interventions Bladder scan;Intermittent/Straight cath  Bladder Scan Volume (mL) 356 mL  Intermittent/Straight Cath (mL) 500 mL  Intermittent Catheter Size 16

## 2020-05-02 LAB — CBC
HCT: 29.6 % — ABNORMAL LOW (ref 36.0–46.0)
Hemoglobin: 9.5 g/dL — ABNORMAL LOW (ref 12.0–15.0)
MCH: 32.2 pg (ref 26.0–34.0)
MCHC: 32.1 g/dL (ref 30.0–36.0)
MCV: 100.3 fL — ABNORMAL HIGH (ref 80.0–100.0)
Platelets: 231 10*3/uL (ref 150–400)
RBC: 2.95 MIL/uL — ABNORMAL LOW (ref 3.87–5.11)
RDW: 20.1 % — ABNORMAL HIGH (ref 11.5–15.5)
WBC: 13.3 10*3/uL — ABNORMAL HIGH (ref 4.0–10.5)
nRBC: 0 % (ref 0.0–0.2)

## 2020-05-02 LAB — TYPE AND SCREEN
ABO/RH(D): O POS
Antibody Screen: NEGATIVE
Unit division: 0

## 2020-05-02 LAB — BPAM RBC
Blood Product Expiration Date: 202202142359
ISSUE DATE / TIME: 202201130739
Unit Type and Rh: 5100

## 2020-05-02 LAB — SURGICAL PATHOLOGY

## 2020-05-02 MED ORDER — ADULT MULTIVITAMIN W/MINERALS CH
1.0000 | ORAL_TABLET | Freq: Every day | ORAL | Status: DC
  Administered 2020-05-02 – 2020-05-05 (×4): 1 via ORAL
  Filled 2020-05-02 (×5): qty 1

## 2020-05-02 MED ORDER — ENSURE MAX PROTEIN PO LIQD
11.0000 [oz_av] | Freq: Two times a day (BID) | ORAL | Status: DC
  Administered 2020-05-02 – 2020-05-05 (×6): 11 [oz_av] via ORAL

## 2020-05-02 NOTE — Progress Notes (Signed)
Initial Nutrition Assessment  DOCUMENTATION CODES:   Not applicable  INTERVENTION:   Ensure Max po BID, each supplement provides 150 kcals and 30 grams of protein  Magic cup BID with meals, each supplement provides 290 kcal and 9 grams of protein  MVI with minerals   NUTRITION DIAGNOSIS:   Increased nutrient needs related to post-op healing as evidenced by estimated needs.  GOAL:   Patient will meet greater than or equal to 90% of their needs  MONITOR:   PO intake,Supplement acceptance,Labs,Weight trends,Skin  REASON FOR ASSESSMENT:   Consult Wound healing,Poor PO  ASSESSMENT:   65 y.o. female with PMH of CAD s/p stents, CHF, CVA, stroke involving the RUE and RLE and PVD s/p RLE revascular procedure with hx of chronic leg wound. 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  Examined pt at bedside. Husband present at bedside to provide history. Pt unable to mouth word but would nod in response to questions. Husband reports pt's appetite was good PTA and is increasing since her surgery 2 days ago. Husband states she has 3 meals a day at home. Typical intake: B:sausauge, egg, and cheese sandwich, L: meat, vegetable, starch, D: hot dog and chips. He reports she has Ensure at home and drinks one a day. Ensure Enlive was noted at bedside with 50% consumed. Husband reports bringing in snacks for patient during this admission. Per chart review, pt consumed 50% of breakfast this AM.   Husband denies any recent weight loss. He reports she has gained a few pounds recently. He stated her UBW is around 165 lbs. Per chart review, pt weighed 185 lbs on 01/28/20. Pt weight appears to trend down since October. On 04/23/20 pt weight was 151 lbs. This indicates a weight loss of 18% in 3 months, which is significant for time frame. Current weight this admission, prior to AKA was 165 lbs.   Pt does not ambulate at home. She  relies solely on an electronic scooter. Nutrition focused physical exam indicates areas of muscle loss in LE, likely d/t pt prolonged immobility.   Labs reviewed.   Medications reviewed and include: Colace, Protonix, KCl tablet, Demadex   NUTRITION - FOCUSED PHYSICAL EXAM:  Flowsheet Row Most Recent Value  Orbital Region No depletion  Upper Arm Region No depletion  Thoracic and Lumbar Region No depletion  Buccal Region No depletion  Temple Region No depletion  Clavicle Bone Region No depletion  Clavicle and Acromion Bone Region No depletion  Scapular Bone Region No depletion  Dorsal Hand No depletion  Patellar Region Moderate depletion  Anterior Thigh Region Mild depletion  Posterior Calf Region Moderate depletion  Edema (RD Assessment) Mild  Hair Reviewed  Eyes Reviewed  Mouth Reviewed  Skin Reviewed  Nails Reviewed       Diet Order:   Diet Order            Diet - low sodium heart healthy           Diet regular Room service appropriate? Yes; Fluid consistency: Thin  Diet effective now                 EDUCATION NEEDS:   Education needs have been addressed  Skin:  Skin Assessment: Skin Integrity Issues: Skin Integrity Issues:: Incisions Incisions: R AKA  Last BM:  04/29/20  Height:   Ht Readings from Last 1 Encounters:  04/30/20 5\' 7"  (1.702 m)  Weight:   Wt Readings from Last 1 Encounters:  04/30/20 74.8 kg     BMI:  Body mass index is 25.83 kg/m.  Estimated Nutritional Needs:   Kcal:  1800-2000 kcals  Protein:  100-115 grams  Fluid:  >1.8 L/day   Ronnald Nian, Dietetic Intern Pager: [phone] If unavailable: [phone]

## 2020-05-02 NOTE — Plan of Care (Signed)
  Problem: Activity: Goal: Risk for activity intolerance will decrease Outcome: Progressing   Problem: Pain Managment: Goal: General experience of comfort will improve Outcome: Progressing   

## 2020-05-02 NOTE — Progress Notes (Signed)
Physical Therapy Treatment Patient Details Name: Danielle Cruz MRN: [id number] DOB: 31-Jul-1955 Today's Date: 05/02/2020    History of Present Illness 65 year old woman with peripheral vascular disease and a stroke involving the right upper and right lower extremity.  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 05/01/20 post-op Hgb 6.9 and to get transfusion.   PT Comments    Pt supine on arrival, agreeable to therapy session with fair participation and tolerance for session. Pt very pain limited and unable to progress pt to EOB mobility, pt only agreeable to long sit in bed ~2 mins with max encouragement. Pt performed supine to long sit after HOB elevated with +82minA and L handrail for support. Pt able to remain seated with +74min/modA. Pt HR and SpO2 WNL during static sitting in bed. Pt performed supine LLE A/AAROM therapeutic exercises with good tolerance for strengthening, encouraged pt to perform therex TID as able. Pt also instructed on phantom pain reduction techniques and proper positioning in bed, will need reinforcement due to aphasia/cognitive deficit. Pt continues to benefit from PT services to progress toward functional mobility goals. Continue to recommend SNF for safe progression of functional mobility.   Follow Up Recommendations  Supervision/Assistance - 24 hour;SNF (if family refuses SNF, max support at home recommended)     Equipment Recommendations  Other (comment) (pt may need mechanical lift if unable to progress SPT or slide board; will continue to assess)    Recommendations for Other Services       Precautions / Restrictions Precautions Precautions: Fall Precaution Comments: wound vac in place Restrictions Weight Bearing Restrictions: Yes RLE Weight Bearing: Non weight bearing Other Position/Activity Restrictions: patient with severe CVA in remote past with R U&LE deficits noted.     Mobility  Bed Mobility Overal bed mobility: Needs Assistance Bed Mobility: Supine to Sit (supine to long sit)     Supine to sit: Min assist;+2 for physical assistance;HOB elevated     General bed mobility comments: pt using LUE on rail, able to pull up to long sit with +42minA  Transfers                 General transfer comment: pt declined. started yelling out and crying once encouraged to pivot from long sit to EOB. RN notified and pt given ice pack for R residual limb  Ambulation/Gait             General Gait Details: nonambulatory at baseline due to RLE wounds, uses power WC per spouse   Stairs             Wheelchair Mobility    Modified Rankin (Stroke Patients Only)       Balance Overall balance assessment: Needs assistance Sitting-balance support: Single extremity supported Sitting balance-Leahy Scale: Poor Sitting balance - Comments: needs +4min/modA for seated balance in long sit for 2 minutes; pt refuses EOB/standing                                    Cognition Arousal/Alertness: Awake/alert Behavior During Therapy: Anxious Overall Cognitive Status: Difficult to assess                                 General Comments: Pt able to follow single step commands appropriately, shakes head  yes/no appropriatley to questions      Exercises General Exercises - Lower Extremity Ankle Circles/Pumps: AROM;Left;10 reps;Supine Heel Slides: Strengthening;Left;10 reps;Supine;AAROM Hip ABduction/ADduction: AAROM;Strengthening;Left;10 reps;Supine Straight Leg Raises: AAROM;Left;10 reps;Supine    General Comments General comments (skin integrity, edema, etc.): pt guarding RLE, pt instructed on phantom pain reduction techniques but not tolerating therapist to touch RLE; ice given after mobility      Pertinent Vitals/Pain Pain Assessment: Faces Faces Pain Scale: Hurts whole lot Pain Location: R LE Pain Descriptors /  Indicators: Grimacing;Guarding;Moaning Pain Intervention(s): Limited activity within patient's tolerance;Monitored during session;Repositioned;Ice applied (pt encouraged to take pain meds but shaking head no, RN notified; pt agreeable to ice pack and instructed on phantom pain reduction techniques)    Home Living                      Prior Function            PT Goals (current goals can now be found in the care plan section) Acute Rehab PT Goals Patient Stated Goal: Spouse hopes to get her back home (per OT eval) PT Goal Formulation: Patient unable to participate in goal setting Time For Goal Achievement: 05/15/20 Potential to Achieve Goals: Fair Progress towards PT goals: Progressing toward goals (slow progress)    Frequency    Min 2X/week      PT Plan Current plan remains appropriate    Co-evaluation              AM-PAC PT "6 Clicks" Mobility   Outcome Measure  Help needed turning from your back to your side while in a flat bed without using bedrails?: A Lot Help needed moving from lying on your back to sitting on the side of a flat bed without using bedrails?: A Lot Help needed moving to and from a bed to a chair (including a wheelchair)?: Total Help needed standing up from a chair using your arms (e.g., wheelchair or bedside chair)?: Total Help needed to walk in hospital room?: Total Help needed climbing 3-5 steps with a railing? : Total 6 Click Score: 8    End of Session   Activity Tolerance: Patient limited by pain Patient left: in bed;with call bell/phone within reach;with bed alarm set Nurse Communication: Mobility status;Other (comment) (in pain (cannot request meds)) PT Visit Diagnosis: Other abnormalities of gait and mobility (R26.89);Muscle weakness (generalized) (M62.81);Pain Pain - Right/Left: Right Pain - part of body: Leg     Time: 5956-3875 PT Time Calculation (min) (ACUTE ONLY): 18 min  Charges:  $Therapeutic Exercise: 8-22  mins                     Danielle Duffy P., PTA Acute Rehabilitation Services Pager: [phone] Office: [place] 05/02/2020, 3:04 PM

## 2020-05-02 NOTE — Plan of Care (Signed)
  Problem: Pain Managment: Goal: General experience of comfort will improve Outcome: Progressing   Problem: Safety: Goal: Ability to remain free from injury will improve Outcome: Progressing   Problem: Skin Integrity: Goal: Risk for impaired skin integrity will decrease Outcome: Progressing   

## 2020-05-02 NOTE — Progress Notes (Addendum)
Patient is lying bed has difficulty communicating but is awake and alert.S/P Above Knee Amputation  Vital signs stable alert pleasant to exam improved hemoglobin was 9.5.  Wound VAC is in place 0 cc in the canister    Will write orders to discharge with home health tomorrow.  Also nursing home being addressed may need to change to discharge to skilled nursing. Pain med rx is printed and on chart.

## 2020-05-02 NOTE — Progress Notes (Signed)
Inpatient Rehabilitation Admissions Coordinator   Noted PT and OT recs for SNF vs home with [facility] and [facility] support. We will not pursue CIR admit at this time, for patient not at a level to participate in the intensity required at this time. I will alert acute team and TOC.  Danne Baxter, RN, MSN Rehab Admissions Coordinator [phone] 05/02/2020 8:34 AM

## 2020-05-03 NOTE — TOC Initial Note (Signed)
Transition of Care [facility]) - Initial/Assessment Note    Patient Details  Name: Danielle Cruz MRN: [id number] Date of Birth: [birth year]  Transition of Care [facility]) CM/SW Contact:    Vinie Sill, [place] Phone Number: 05/03/2020, 9:35 AM  Clinical Narrative:                  CSW spoke with patient's spouse,Jerry via phone. CSW introduced self and explained role. CSW discussed PT recommendation of short term rehab at [facility]. Patient's spouse declined SNF. CSW was informed patient has had a caregiver for the past 8 years. He does not believe she would participate with PT unless he was there to support/encourage  her. He states he can transport patient to therapy if needed.  His preference is home with HH. No preferred [facility] agency. He expressed his appreciation for all the staff caring for his wife.   TOC will continue to follow and assist with discharge planning.   Thurmond Butts, MSW, LCSW Clinical Social Worker   Expected Discharge Plan: [place] Barriers to Discharge: Continued Medical Work up   Patient Goals and CMS Choice        Expected Discharge Plan and Services Expected Discharge Plan: [place] In-house Referral: Clinical Social Work     Living arrangements for the past 2 months: Single Family Home Expected Discharge Date: 05/03/20                                    Prior Living Arrangements/Services Living arrangements for the past 2 months: Single Family Home Lives with:: Self,Spouse Patient language and need for interpreter reviewed:: No        Need for Family Participation in Patient Care: Yes (Comment) Care giver support system in place?: Yes (comment)   Criminal Activity/Legal Involvement Pertinent to Current Situation/Hospitalization: No - Comment as needed  Activities of Daily Living Home Assistive Devices/Equipment: Brace (specify type),Walker (specify type),Wheelchair,Dentures (specify type),Bedside  commode/3-in-1 (arm and foot brace) ADL Screening (condition at time of admission) Patient's cognitive ability adequate to safely complete daily activities?: No Is the patient deaf or have difficulty hearing?: No Does the patient have difficulty seeing, even when wearing glasses/contacts?: No Does the patient have difficulty concentrating, remembering, or making decisions?: Yes Patient able to express need for assistance with ADLs?: No Does the patient have difficulty dressing or bathing?: Yes Independently performs ADLs?: No Communication: Needs assistance Is this a change from baseline?: Pre-admission baseline Dressing (OT): Needs assistance Is this a change from baseline?: Pre-admission baseline Grooming: Independent Feeding: Independent Is this a change from baseline?: Pre-admission baseline Bathing: Needs assistance Is this a change from baseline?: Pre-admission baseline Toileting: Needs assistance Is this a change from baseline?: Pre-admission baseline In/Out Bed: Needs assistance Is this a change from baseline?: Pre-admission baseline Walks in Home: Needs assistance Is this a change from baseline?: Pre-admission baseline Does the patient have difficulty walking or climbing stairs?: Yes Weakness of Legs: Right Weakness of Arms/Hands: Right  Permission Sought/Granted                  Emotional Assessment       Orientation: : Oriented to Self Alcohol / Substance Use: Not Applicable Psych Involvement: No (comment)  Admission diagnosis:  Gangrene of right foot [facility]) [I96] Patient Active Problem List   Diagnosis Date Noted  . Gangrene of right foot ([place]) 04/30/2020  .  Atherosclerosis of native arteries of extremities with gangrene, right leg ([place])   . Cellulitis of foot, right 04/22/2020  . Cellulitis of right lower leg 04/22/2020  . 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 Gangrene of extremity ([place])   . History of CVA  (cerebrovascular accident)   . Chronic pain 03/28/2020  . Fatty liver 03/28/2020  . Osteoporosis 03/28/2020  . Long term (current) use of opiate analgesic 03/28/2020  . Ischemic ulcer of lower leg due to atherosclerotic disease ([place]) 02/14/2020  . Cellulitis 12/23/2019  . Ulcer of right foot ([place]) 12/22/2019  . PVD (peripheral vascular disease) ([place]) 12/20/2019  . Incontinence in female 10/05/2019  . Urinary incontinence in female 10/02/2019  . Chronic diastolic CHF (congestive heart failure) ([place]) 10/02/2019  . Diverticulosis of colon 02/04/2018  . Hemorrhoid 02/04/2018  . FH: breast cancer in relative when <5 years old 01/25/2018  . Hematochezia   . GI bleed 10/03/2017  . Hip fracture ([place]) 09/22/2017  . AKI (acute kidney injury) ([place]) 09/22/2017  . At high risk for falls 01/31/2017  . Disorder of bone and cartilage 05/11/2016  . Normocytic anemia 01/08/2015  . Elevated ferritin 01/08/2015  . Presence of stent in coronary artery in patient with coronary artery disease 12/07/2014  . Need for influenza vaccination 05/10/2014  . Fasting hyperglycemia 11/05/2011  . Elevated transaminase level 01/08/2011  . Tobacco abuse   . Rheumatoid arthritis ([place])   . Colonic polyp   . 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 Peripheral vascular disease ([place]) 01/14/2010  . Macrocytic anemia 04/01/2009  . Hyperlipidemia 11/14/2008  . Essential hypertension 11/14/2008  . Hypertension 2005   PCP:  Fayrene Helper, MD Pharmacy:   [place], [address] AT [address] [place] [address] Phone: [phone] Fax: [phone]     Social Determinants of Health (SDOH) Interventions    Readmission Risk Interventions No flowsheet data found.

## 2020-05-03 NOTE — TOC Progression Note (Signed)
Transition of Care [facility]) - Progression Note    Patient Details  Name: Jasma C Diluzio MRN: [id number] Date of Birth: Nov 30, 1955  Transition [facility]) CM/SW Contact  Bartholomew Crews, RN Phone Number: [phone] 05/03/2020, 4:35 PM  Clinical Narrative:     Spoke with patient's spouse, [name], on the phone to discuss plans for transition home next week. All needed DME in the home to include hospital bed, wheelchair, lift chair, bedside commode, and walker. Paitent has an Engineer, production through [employer] who she has had the last 8-9 years. [name] will provide transportation home stating that he has a Printmaker. Verified demographics, PCP, and preferred pharmacy.   Discussed home health referral for PT and OT. [name] is agreeable. Offered choice of agency. [facility] accepted referral. Patient will need HH order for PT and OT with [facility].   TOC following for transition needs.   Expected Discharge Plan: [place] Barriers to Discharge: Continued Medical Work up  Expected Discharge Plan and Services Expected Discharge Plan: [place] In-house Referral: Clinical Social Work Discharge Planning Services: CM Consult   Living arrangements for the past 2 months: [place] Expected Discharge Date: 05/03/20               DME Arranged: N/A DME Agency: NA       HH Arranged: PT,OT [place] Agency: [place] Date [facility] Agency Contacted: 05/03/20 Time [place]: 1635 Representative spoke with at [place]: [place] ([place]) Interventions    Readmission Risk Interventions No flowsheet data found.

## 2020-05-03 NOTE — Progress Notes (Addendum)
Subjective: 3 Days Post-Op Procedure(s) (LRB): RIGHT ABOVE KNEE AMPUTATION (Right) Patient reports pain as mild.    Objective: Vital signs in last 24 hours: Temp:  [97.6 F (36.4 C)-99 F (37.2 C)] 98 F (36.7 C) (01/15 0300) Pulse Rate:  [88-93] 90 (01/15 0300) Resp:  [16-18] 18 (01/15 0300) BP: (132-142)/(63-69) 141/69 (01/15 0300) SpO2:  [100 %] 100 % (01/15 0300)  Intake/Output from previous day: 01/14 0701 - 01/15 0700 In: 480 [P.O.:480] Out: 1001 [Urine:1000; Stool:1] Intake/Output this shift: No intake/output data recorded.  Recent Labs    04/30/20 0852 05/01/20 0247 05/01/20 1226 05/02/20 0245  HGB 10.5* 6.9* 9.8* 9.5*   Recent Labs    05/01/20 0247 05/01/20 1226 05/02/20 0245  WBC 13.4*  --  13.3*  RBC 2.06*  --  2.95*  HCT 21.5* 30.4* 29.6*  PLT 244  --  231   Recent Labs    04/30/20 0852  NA 138  K 4.0  CL 108  BUN 11  CREATININE 1.00  GLUCOSE 83   No results for input(s): LABPT, INR in the last 72 hours.  Neurologically intact  Wound vac in place and functioning properly.  No fluid in canister   Assessment/Plan: 3 Days Post-Op Procedure(s) (LRB): RIGHT ABOVE KNEE AMPUTATION (Right) Up with therapy  NWB RLE Continue wound vac D/c likely Monday- home with hhpt vs SNF.        Danielle Cruz 05/03/2020, 8:07 AM

## 2020-05-04 NOTE — Plan of Care (Signed)

## 2020-05-04 NOTE — Progress Notes (Signed)
Patient stable.  No events. Wound vac good seal and suction. Possible d/c tomorrow.

## 2020-05-05 MED ORDER — OXYCODONE-ACETAMINOPHEN 5-325 MG PO TABS: 1.0000 | ORAL_TABLET | Freq: Four times a day (QID) | ORAL | 0 refills | Status: DC | PRN

## 2020-05-05 NOTE — Discharge Summary (Signed)
Discharge Diagnoses:  Active Problems:   Atherosclerosis of native arteries of extremities with gangrene, right leg (HCC)   Gangrene of right foot ([place])   Surgeries: Procedure(s): RIGHT ABOVE KNEE AMPUTATION on 04/30/2020    Consultants:   Discharged Condition: Improved  Hospital Course: Danielle Cruz is an 65 y.o. female who was admitted 04/30/2020 with a chief complaint of Right above knee amputation, with a final diagnosis of Gangrene Right Leg.  Patient was brought to the operating room on 04/30/2020 and underwent Procedure(s): RIGHT ABOVE KNEE AMPUTATION.    Patient was given perioperative antibiotics:  Anti-infectives (From admission, onward)   Start     Dose/Rate Route Frequency Ordered Stop   05/01/20 1000  hydroxychloroquine (PLAQUENIL) tablet 200 mg  Status:  Discontinued        200 mg Oral every 72 hours 04/30/20 1355 05/01/20 0652   04/30/20 2200  hydroxychloroquine (PLAQUENIL) tablet 200 mg        200 mg Oral every 72 hours 04/30/20 1406     04/30/20 1500  ceFAZolin (ANCEF) IVPB 1 g/50 mL premix        1 g 100 mL/hr over 30 Minutes Intravenous Every 6 hours 04/30/20 1355 05/01/20 0448   04/30/20 0700  ceFAZolin (ANCEF) IVPB 2g/100 mL premix        2 g 200 mL/hr over 30 Minutes Intravenous On call to O.R. 04/30/20 [id number] 04/30/20 1034    .  Patient was given sequential compression devices, early ambulation, and aspirin for DVT prophylaxis.  Recent vital signs:  Patient Vitals for the past 24 hrs:  BP Temp Temp src Pulse Resp SpO2  05/05/20 1157 128/67 98 F (36.7 C) - 100 16 100 %  05/05/20 0430 132/60 98.3 F (36.8 C) Oral 86 17 100 %  05/04/20 1948 123/68 98.5 F (36.9 C) - 93 16 100 %  .  Recent laboratory studies: No results found.  Discharge Medications:   Allergies as of 05/05/2020      Reactions   Ace Inhibitors Cough   New daily cough since starting ACE      Medication List    STOP taking these medications   clopidogrel 75 MG tablet Commonly  known as: Plavix   HYDROcodone-acetaminophen 5-325 MG tablet Commonly known as: NORCO/VICODIN   nicotine 14 mg/24hr patch Commonly known as: NICODERM CQ - dosed in mg/24 hours     TAKE these medications   acetaminophen 325 MG tablet Commonly known as: TYLENOL Take 650 mg by mouth every 6 (six) hours as needed for mild pain or moderate pain.   amLODipine 5 MG tablet Commonly known as: NORVASC TAKE 1 TABLET(5 MG) BY MOUTH DAILY What changed: See the new instructions.   aspirin EC 81 MG tablet Take 81 mg by mouth daily. Swallow whole.   CALCIUM 1200+D3 PO Take 1 tablet by mouth daily.   ezetimibe 10 MG tablet Commonly known as: ZETIA TAKE 1 TABLET(10 MG) BY MOUTH DAILY What changed: See the new instructions.   fluticasone 50 MCG/ACT nasal spray Commonly known as: FLONASE SHAKE LIQUID AND USE 1 SPRAY IN EACH NOSTRIL DAILY What changed: See the new instructions.   hydroxychloroquine 200 MG tablet Commonly known as: PLAQUENIL Take 200 mg by mouth See admin instructions. Take 200 mg twice daily every 3rd day   metoprolol succinate 25 MG 24 hr tablet Commonly known as: TOPROL-XL TAKE 1 TABLET(25 MG) BY MOUTH DAILY What changed: See the new instructions.   montelukast 10 MG tablet  Commonly known as: SINGULAIR TAKE 1 TABLET(10 MG) BY MOUTH AT BEDTIME What changed: See the new instructions.   multivitamin with minerals Tabs tablet Take 1 tablet by mouth daily.   oxybutynin 5 MG tablet Commonly known as: DITROPAN TAKE 1/2 TABLET(2.5 MG) BY MOUTH TWICE DAILY What changed: See the new instructions.   oxyCODONE-acetaminophen 5-325 MG tablet Commonly known as: Percocet Take 1 tablet by mouth every 6 (six) hours as needed.   pantoprazole 40 MG tablet Commonly known as: PROTONIX TAKE 1 TABLET(40 MG) BY MOUTH DAILY What changed: See the new instructions.   potassium chloride SA 20 MEQ tablet Commonly known as: KLOR-CON Take 2 tablets (40 mEq total) by mouth  daily. What changed: how much to take   rosuvastatin 20 MG tablet Commonly known as: Crestor Take one tablet by mouth every Monday, Wednesday, Friday and Sunday What changed:   how much to take  how to take this  when to take this  additional instructions   sulfamethoxazole-trimethoprim 800-160 MG tablet Commonly known as: BACTRIM DS Take 1 tablet by mouth 2 (two) times daily.   torsemide 20 MG tablet Commonly known as: DEMADEX Take 0.5 tablets (10 mg total) by mouth daily.   UNABLE TO FIND Under pads use as needed  Pullups use as needed   Length of need- 99 months DX urinary incontinence   UNABLE TO FIND Incontinence briefs and pads Dx incontinence   Vitamin D3 125 MCG (5000 UT) Caps Take 5,000 Units by mouth daily.       Diagnostic Studies: DG Tibia/Fibula Right  Result Date: 04/22/2020 CLINICAL DATA:  Source foot and posterior calf ulcer, question gas or bone involvement EXAM: RIGHT TIBIA AND FIBULA - 2 VIEW COMPARISON:  03/28/2020 FINDINGS: Diffuse soft tissue swelling. Skin ulcer identified at the medial margin of the distal RIGHT lower leg. Scattered soft tissue gas at the medial soft tissues posteriorly over approximately 12 cm length. Additional small skin ulcer overlying lateral malleolus. No fracture, dislocation, or bone destruction. Tibiotalar fusion with subtalar and additional intertarsal degenerative changes seen. Plantar calcaneal spurring. Small vessel vascular calcifications. IM nail within distal femur. Mild joint space narrowing at medial compartment RIGHT knee. IMPRESSION: Skin ulcer identified at posterior margin of distal RIGHT lower leg medially over 12 cm length. Additional small skin ulcer laterally overlying lateral malleolus. No underlying osseous abnormalities. Tibiotalar fusion with additional intertarsal degenerative changes. Electronically Signed   By: Lavonia Dana M.D.   On: 04/22/2020 14:47   DG Foot Complete Right  Result Date:  04/22/2020 CLINICAL DATA:  Several ulcers on the top and medial aspect of the foot. Ulcers along the posterior lower leg. EXAM: RIGHT FOOT COMPLETE - 3+ VIEW COMPARISON:  03/28/2020 and on 02/12/2020 and CT ankle 12/22/2019 FINDINGS: Soft tissue irregularity along the dorsal aspect of the foot compatible with history of ulcerations. In addition, there is lucency and soft tissue abnormality is along the posterior lower calf region and this is also compatible with history of ulcerations. Diffuse vascular calcifications. Diffuse osteopenia in the ankle and foot. 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 Again noted is abnormal appearance of the MTP joints with marked joint space narrowing at the fourth MTP joint. There appears to be a new fracture along the medial base of the fourth toe proximal phalanx. Difficult to exclude additional fractures involving the proximal phalanx of the fourth toe. New lucency involving the medial base of the little toe proximal phalanx. Again noted is abnormal appearance of the  metatarsal heads particularly the third, fourth and fifth. Appearance of the metatarsal heads raise concern for previous inflammatory changes and erosive changes. There may be chronic lucency or fracture involving the medial base of the third toe proximal phalanx. Evidence for extensive ankylosis involving the tarsal bones. IMPRESSION: 1. New fractures involving the proximal phalanx of the fourth toe and little toe. Possible old fracture involving the proximal phalanx of the third toe. 2. Chronic abnormalities at the MTP joints with remodeling and abnormality of the metatarsal heads. Findings are concerning for underlying inflammatory arthropathy. 3. Soft tissue irregularity along the dorsum of the foot and posterior aspect of the calf. Findings are compatible with known ulcerations. No clear evidence for osteomyelitis at these areas of soft tissue abnormality. 4. 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 Electronically Signed   By: Markus Daft M.D.   On: 04/22/2020 14:57    Patient benefited maximally from their hospital stay and there were no complications.     Disposition: Discharge disposition: 06-Home-Health Care Svc      Discharge Instructions    Call MD / Call 911   Complete by: As directed    If you experience chest pain or shortness of breath, CALL 911 and be transported to the hospital emergency room.  If you develope a fever above 101 F, pus (white drainage) or increased drainage or redness at the wound, or calf pain, call your surgeon's office.   Call MD / Call 911   Complete by: As directed    If you experience chest pain or shortness of breath, CALL 911 and be transported to the hospital emergency room.  If you develope a fever above 101 F, pus (white drainage) or increased drainage or redness at the wound, or calf pain, call your surgeon's office.   Constipation Prevention   Complete by: As directed    Drink plenty of fluids.  Prune juice may be helpful.  You may use a stool softener, such as Colace (over the counter) 100 mg twice a day.  Use MiraLax (over the counter) for constipation as needed.   Constipation Prevention   Complete by: As directed    Drink plenty of fluids.  Prune juice may be helpful.  You may use a stool softener, such as Colace (over the counter) 100 mg twice a day.  Use MiraLax (over the counter) for constipation as needed.   Diet - low sodium heart healthy   Complete by: As directed    Diet - low sodium heart healthy   Complete by: As directed    Discharge instructions   Complete by: As directed    Show family how to attach prevena vac and charge. Should call office if alarms   Increase activity slowly as tolerated   Complete by: As directed    Increase activity slowly as tolerated   Complete by: As directed    Negative Pressure Wound Therapy - Incisional   Complete by: As directed    Show patient how to attach prevena vac.       Follow-up Information    Dhiren Azimi, Bevely Palmer, [place] In 1 week.   Specialty: Orthopedic Surgery Contact information: [place] [address] [phone]        Care, [facility] Follow up.   Specialty: [place] Why: the office will call to schedule home health visits Contact information: [place] [address] [phone]  Signed: Bevely Palmer Wai Litt 05/05/2020, 1:02 PM

## 2020-05-05 NOTE — Progress Notes (Signed)
PT Cancellation Note  Patient Details Name: Danielle Cruz MRN: [id number] DOB: 1956/04/02   Cancelled Treatment:    Reason Eval/Treat Not Completed: Other (comment) Pt is discharging shortly and was sitting in a chair at arrival.  Stopped by and spoke with pt's husband who has been able helping pt stand pivot to chair and BSC.  Reports they have DME and he is comfortable assisting her with transfers as he has done for past several years.    Danielle Cruz, PT Acute Rehab Services Pager [phone] [facility] [place] 05/05/2020, 1:20 PM

## 2020-05-05 NOTE — TOC Transition Note (Signed)
Transition of Care [facility]) - CM/SW Discharge Note   Patient Details  Name: Danielle Cruz MRN: [id number] Date of Birth: 29-Aug-1955  Transition of Care [facility]) CM/SW Contact:  Sharin Mons, RN Phone Number: 05/05/2020, 1:10 PM   Clinical Narrative:    Patient will DC to: home with family Anticipated DC date: 05/05/2020 Family notified: husband Transport by: car   Per MD patient ready for DC today. RN, patient,  patient's husband and [facility]  notified of DC. Pt without Rx med concerns or affordability issues. Post hospital f/u noted on AVS.  RNCM will sign off for now as intervention is no longer needed. Please consult [place] again if new needs arise.   Final next level of care: [place] Barriers to Discharge: No Barriers Identified   Patient Goals and CMS Choice        Discharge Placement                       Discharge Plan and Services In-house Referral: Clinical Social Work Discharge Planning Services: CM Consult            DME Arranged: N/A DME Agency: NA       HH Arranged: PT,OT [place] Agency: [place] Date [place]: 05/03/20 Time [place]: 1635 Representative spoke with at [place]: [place] ([place]) Interventions     Readmission Risk Interventions No flowsheet data found.

## 2020-05-05 NOTE — Progress Notes (Signed)
Patient is status post right above-knee amputation she is doing well she would like to go home with her husband today who is her primary caregiver   Vital signs stable she is sitting up in chair attentive husband is at her bedside wound VAC was removed today her incision is healing quite well she has well apposed wound edges without any signs of cellulitis or dehiscence.  Plan discharged to care of family today follow-up in 1 week

## 2020-05-05 NOTE — Plan of Care (Signed)

## 2020-05-05 NOTE — Progress Notes (Signed)
Pt was given her AVS discharge summary and went over with her and Husband. Pt had no further questions. IV was removed with catheter intact.

## 2020-05-08 ENCOUNTER — Other Ambulatory Visit: Payer: Self-pay | Admitting: Cardiology

## 2020-05-09 ENCOUNTER — Encounter: Payer: Medicare Other | Admitting: Vascular Surgery

## 2020-05-09 ENCOUNTER — Encounter (HOSPITAL_COMMUNITY): Payer: Medicare Other

## 2020-05-12 ENCOUNTER — Encounter: Payer: Self-pay | Admitting: Orthopedic Surgery

## 2020-05-12 ENCOUNTER — Other Ambulatory Visit: Payer: Self-pay

## 2020-05-12 ENCOUNTER — Ambulatory Visit (INDEPENDENT_AMBULATORY_CARE_PROVIDER_SITE_OTHER): Payer: Medicare Other | Admitting: Physician Assistant

## 2020-05-12 VITALS — Ht 67.0 in | Wt 164.0 lb

## 2020-05-12 DIAGNOSIS — Z89611 Acquired absence of right leg above knee: Secondary | ICD-10-CM

## 2020-05-12 DIAGNOSIS — S78119A Complete traumatic amputation at level between unspecified hip and knee, initial encounter: Secondary | ICD-10-CM

## 2020-05-12 MED ORDER — HYDROCODONE-ACETAMINOPHEN 5-325 MG PO TABS: 1.0000 | ORAL_TABLET | ORAL | 0 refills | Status: DC | PRN

## 2020-05-12 NOTE — Progress Notes (Signed)
Office Visit Note   Patient: Danielle Cruz           Date of Birth: 02/07/1956           MRN: [id number] Visit Date: 05/12/2020              Requested by: Fayrene Helper, MD [address] [place],  [place] [address] PCP: Fayrene Helper, MD  Chief Complaint  Patient presents with  . Right Leg - Routine Post Op    04/30/2020 Right AKA      HPI: Patient is 12 days status post right above-knee amputation.  She is being cared for by family at home and is doing well.  She takes an occasional pain pill.  Her husband thinks she is much better than prior to surgery  Assessment & Plan: Visit Diagnoses: No diagnosis found.  Plan: Plan for removal of sutures and staples next week.  Also will provide a prescription for a transfer prosthetic next week.  Follow-Up Instructions: No follow-ups on file.   Ortho Exam  Patient is alert, oriented, no adenopathy, well-dressed, normal affect, normal respiratory effort. Right above-knee amputation well apposed wound edges no drainage sutures and staples are in place swelling is well controlled no cellulitis or signs of infection  Imaging: No results found. No images are attached to the encounter.  Labs: Lab Results  Component Value Date   HGBA1C 6.1 (H) 12/22/2019   HGBA1C 5.7 (H) 11/25/2015   HGBA1C 5.6 11/18/2014   ESRSEDRATE 125 (H) 04/22/2020   ESRSEDRATE 72 (H) 12/22/2019   CRP 1.4 (H) 12/22/2019   LABURIC 4.1 06/05/2010   REPTSTATUS 04/27/2020 FINAL 04/22/2020   CULT  04/22/2020    NO GROWTH 5 DAYS Performed at [facility], [address] [place], [place] [address]      Lab Results  Component Value Date   ALBUMIN 2.6 (L) 04/22/2020   ALBUMIN 2.5 (L) 04/01/2020   ALBUMIN 3.3 (L) 10/01/2019   LABURIC 4.1 06/05/2010    Lab Results  Component Value Date   MG 2.0 09/24/2017   MG 1.6 (L) 09/23/2017   MG 1.7 11/25/2015   Lab Results  Component Value Date   VD25OH 85 01/10/2020   VD25OH 59 01/22/2019    VD25OH 34 10/25/2017    No results found for: PREALBUMIN CBC EXTENDED Latest Ref Rng & Units 05/02/2020 05/01/2020 05/01/2020  WBC 4.0 - 10.5 K/uL 13.3(H) - 13.4(H)  RBC 3.87 - 5.11 MIL/uL 2.95(L) - 2.06(L)  HGB 12.0 - 15.0 g/dL 9.5(L) 9.8(L) 6.9(LL)  HCT 36.0 - 46.0 % 29.6(L) 30.4(L) 21.5(L)  PLT 150 - 400 K/uL 231 - 244  NEUTROABS 1.7 - 7.7 K/uL - - -  LYMPHSABS 0.7 - 4.0 K/uL - - -     Body mass index is 25.69 kg/m.  Orders:  No orders of the defined types were placed in this encounter.  Meds ordered this encounter  Medications  . HYDROcodone-acetaminophen (NORCO/VICODIN) 5-325 MG tablet    Sig: Take 1-2 tablets by mouth every 4 (four) hours as needed for moderate pain.    Dispense:  30 tablet    Refill:  0     Procedures: No procedures performed  Clinical Data: No additional findings.  ROS:  All other systems negative, except as noted in the HPI. Review of Systems  Objective: Vital Signs: Ht 5\' 7"  (1.702 m)   Wt 164 lb (74.4 kg)   BMI 25.69 kg/m   Specialty Comments:  No specialty comments available.  PMFS History: Patient Active Problem List   Diagnosis Date Noted  . Gangrene of right foot ([place]) 04/30/2020  . Atherosclerosis of native arteries of extremities with gangrene, right leg ([place])   . Cellulitis of foot, right 04/22/2020  . Cellulitis of right lower leg 04/22/2020  . 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 Gangrene of extremity ([place])   . History of CVA (cerebrovascular accident)   . Chronic pain 03/28/2020  . Fatty liver 03/28/2020  . Osteoporosis 03/28/2020  . Long term (current) use of opiate analgesic 03/28/2020  . Ischemic ulcer of lower leg due to atherosclerotic disease ([place]) 02/14/2020  . Cellulitis 12/23/2019  . Ulcer of right foot ([place]) 12/22/2019  . PVD (peripheral vascular disease) ([place]) 12/20/2019  . Incontinence in female 10/05/2019  . Urinary incontinence in female 10/02/2019  . Chronic  diastolic CHF (congestive heart failure) ([place]) 10/02/2019  . Diverticulosis of colon 02/04/2018  . Hemorrhoid 02/04/2018  . FH: breast cancer in relative when <96 years old 01/25/2018  . Hematochezia   . GI bleed 10/03/2017  . Hip fracture ([place]) 09/22/2017  . AKI (acute kidney injury) ([place]) 09/22/2017  . At high risk for falls 01/31/2017  . Disorder of bone and cartilage 05/11/2016  . Normocytic anemia 01/08/2015  . Elevated ferritin 01/08/2015  . Presence of stent in coronary artery in patient with coronary artery disease 12/07/2014  . Need for influenza vaccination 05/10/2014  . Fasting hyperglycemia 11/05/2011  . Elevated transaminase level 01/08/2011  . Tobacco abuse   . Rheumatoid arthritis ([place])   . Colonic polyp   . 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 Peripheral vascular disease ([place]) 01/14/2010  . Macrocytic anemia 04/01/2009  . Hyperlipidemia 11/14/2008  . Essential hypertension 11/14/2008  . Hypertension 2005   Past Medical History:  Diagnosis Date  . AKI (acute kidney injury) ([place]) 09/22/2017  . Anemia   . 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 Arthritis   . CHF (congestive heart failure) ([place]) 10/02/2019  . Colonic polyp 2010   Hemorrhoids; h/o mild hematochezia  . CVA (cerebral infarction) 08/2008   sizable left -residual expressive aphasia, right sided weakness; ambulates with difficulty with the right leg brace   . Hyperlipidemia   . Hypertension 2005  . Osteoporosis 03/28/2020  . Rheumatoid arthritis(714.0)   . Stroke [facility])     Family History  Problem Relation Age of Onset  . Cancer Father        prostate, deceased 42s  . Breast cancer Sister        Deceased 94s  . Heart attack Mother        deceased 72, during childbirth  . Cancer Brother        lymphoma  . Colon cancer Maternal Aunt        older than age 75  . Liver disease Neg Hx     Past  Surgical History:  Procedure Laterality Date  . ABDOMINAL AORTOGRAM W/LOWER EXTREMITY Bilateral 01/28/2020   Procedure: ABDOMINAL AORTOGRAM W/LOWER EXTREMITY;  Surgeon: Waynetta Sandy, MD;  Location: [facility];  Service: Cardiovascular;  Laterality: Bilateral;  . ABOVE KNEE LEG AMPUTATION Right 04/30/2020  . AMPUTATION Right 04/30/2020   Procedure: RIGHT ABOVE KNEE AMPUTATION;  Surgeon: [name], MD;  Location: [facility];  Service: Orthopedics;  Laterality:  Right;  Marland Kitchen ANKLE SURGERY     Right  . CAROTID STENT INSERTION    . COLONOSCOPY W/ POLYPECTOMY  11/2008   Dr. Gala Romney. Left-sided diverticula, Pedunculated polyp snared (no adenomatous changes)  . COLONOSCOPY WITH PROPOFOL N/A 01/12/2018   Procedure: COLONOSCOPY WITH PROPOFOL;  Surgeon: Daneil Dolin, MD;  [facility];  Service: Endoscopy;  Laterality: N/A;  . FEMUR IM NAIL Right 09/23/2017   Procedure: INTRAMEDULLARY (IM) NAIL FEMORAL;  Surgeon: Renette Butters, MD;  Location: [facility];  Service: Orthopedics;  Laterality: Right;  . FOOT SURGERY Right 01/31/2020  . FRACTURE SURGERY     Hip replacement in May 2019 - fall related   . OOPHORECTOMY    . PERIPHERAL VASCULAR BALLOON ANGIOPLASTY Right 01/28/2020   Procedure: PERIPHERAL VASCULAR BALLOON ANGIOPLASTY;  Surgeon: Waynetta Sandy, MD;  Location: [facility];  Service: Cardiovascular;  Laterality: Right;  SFA   Social History   Occupational History  . Occupation: disabilty x 9years    Comment: secondary to arthritis   Tobacco Use  . Smoking status: Current Every Day Smoker    Packs/day: 0.50    Years: 15.00    Pack years: 7.50    Types: Cigarettes  . Smokeless tobacco: Never Used  . Tobacco comment: quit after hospitized for hip fracture   Vaping Use  . Vaping Use: Never used  Substance and Sexual Activity  . Alcohol use: No    Alcohol/week: 0.0 standard drinks    Comment: quit 6 years ago   . Drug use: No  . Sexual activity:  Not on file

## 2020-05-13 ENCOUNTER — Telehealth (INDEPENDENT_AMBULATORY_CARE_PROVIDER_SITE_OTHER): Payer: Medicare Other | Admitting: Student

## 2020-05-13 ENCOUNTER — Telehealth: Payer: Self-pay | Admitting: Student

## 2020-05-13 ENCOUNTER — Encounter: Payer: Self-pay | Admitting: Student

## 2020-05-13 VITALS — Ht 67.0 in | Wt 175.0 lb

## 2020-05-13 DIAGNOSIS — I2510 Atherosclerotic heart disease of native coronary artery without angina pectoris: Secondary | ICD-10-CM

## 2020-05-13 DIAGNOSIS — E785 Hyperlipidemia, unspecified: Secondary | ICD-10-CM

## 2020-05-13 DIAGNOSIS — R600 Localized edema: Secondary | ICD-10-CM

## 2020-05-13 DIAGNOSIS — D649 Anemia, unspecified: Secondary | ICD-10-CM

## 2020-05-13 DIAGNOSIS — I739 Peripheral vascular disease, unspecified: Secondary | ICD-10-CM

## 2020-05-13 DIAGNOSIS — I10 Essential (primary) hypertension: Secondary | ICD-10-CM

## 2020-05-13 MED ORDER — TORSEMIDE 20 MG PO TABS: 10.0000 mg | ORAL_TABLET | ORAL | 3 refills | Status: AC | PRN

## 2020-05-13 NOTE — Progress Notes (Signed)
Virtual Visit via Telephone Note   This visit type was conducted due to national recommendations for restrictions regarding the COVID-19 Pandemic (e.g. social distancing) in an effort to limit this patient's exposure and mitigate transmission in our community.  Due to her co-morbid illnesses, this patient is at least at moderate risk for complications without adequate follow up.  This format is felt to be most appropriate for this patient at this time.  The patient did not have access to video technology/had technical difficulties with video requiring transitioning to audio format only (telephone).  All issues noted in this document were discussed and addressed.  No physical exam could be performed with this format.  Please refer to the patient's chart for her  consent to telehealth for [name].    Date:  05/13/2020   ID:  Danielle Cruz, DOB 1955/07/10, MRN [id number] The patient was identified using 2 identifiers.  Patient Location: Home Provider Location: Office/Clinic  PCP:  Danielle Helper, MD  Cardiologist:  Danielle Dolly, MD  Electrophysiologist:  None   Evaluation Performed:  Follow-Up Visit  Chief Complaint: Routine Visit  History of Present Illness:    Danielle Cruz is a 65 y.o. female with past medical history of CAD (s/p prior stenting to LAD and OM in 2005), HTN, HLD, lower extremity edema (thought to be secondary to venous insufficiency), PVD (s/p prior revascularization of right SFA with balloon angioplasty), tobacco use and prior CVA (with residual aphasia) who presents to the office today for 12-month follow-up.   She was last examined by myself in 11/2019 and had recently been switched from Lasix to Torsemide due to lower extremity edema but had experienced improvement in her symptoms. Was continued on Torsemide 20mg  daily at that time.   In the interim, she was admitted from 1/4 - 04/23/2020 for evaluation of cellulitis in the setting of a chronic wound along  her right foot. 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 She did have post-operative anemia with Hgb at 6.9 but this had improved to 9.5 by discharge. Was discharged home with Home Health services on 05/05/2020.  In talking with the patient today, most history is provided by her husband due to her aphasia.  He reports she has overall been doing well since returning home.  Says that she is doing better now than she has in the past 3+ years.  The pain along her right leg has resolved and they deny any drainage or discharge along her incision site. She denies any recent chest pain or dyspnea on exertion. No recent orthopnea or PND. Her lower extremity edema has resolved since her surgery.   The patient does not have symptoms concerning for COVID-19 infection (fever, chills, cough, or new shortness of breath).    Past Medical History:  Diagnosis Date  . AKI (acute kidney injury) ([place]) 09/22/2017  . Anemia   . 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 Arthritis   . CHF (congestive heart failure) ([place]) 10/02/2019  . Colonic polyp 2010   Hemorrhoids; h/o mild hematochezia  . CVA (cerebral infarction) 08/2008   sizable left -residual expressive aphasia, right sided weakness; ambulates with difficulty with the right leg brace   . Hyperlipidemia   . Hypertension 2005  . Osteoporosis 03/28/2020  . Rheumatoid arthritis(714.0)   . Stroke [facility])  Past Surgical History:  Procedure Laterality Date  . ABDOMINAL AORTOGRAM W/LOWER EXTREMITY Bilateral 01/28/2020   Procedure: ABDOMINAL AORTOGRAM W/LOWER EXTREMITY;  Surgeon: Waynetta Sandy, MD;  Location: [place] CV [facility];  Service: Cardiovascular;  Laterality: Bilateral;  . ABOVE KNEE LEG AMPUTATION Right 04/30/2020  . AMPUTATION Right 04/30/2020   Procedure: RIGHT ABOVE KNEE AMPUTATION;  Surgeon: [name],  MD;  Location: [place];  Service: Orthopedics;  Laterality: Right;  . ANKLE SURGERY     Right  . CAROTID STENT INSERTION    . COLONOSCOPY W/ POLYPECTOMY  11/2008   Dr. Gala Romney. Left-sided diverticula, Pedunculated polyp snared (no adenomatous changes)  . COLONOSCOPY WITH PROPOFOL N/A 01/12/2018   Procedure: COLONOSCOPY WITH PROPOFOL;  Surgeon: Daneil Dolin, MD;  [facility];  Service: Endoscopy;  Laterality: N/A;  . FEMUR IM NAIL Right 09/23/2017   Procedure: INTRAMEDULLARY (IM) NAIL FEMORAL;  Surgeon: Renette Butters, MD;  Location: [place];  Service: Orthopedics;  Laterality: Right;  . FOOT SURGERY Right 01/31/2020  . FRACTURE SURGERY     Hip replacement in May 2019 - fall related   . OOPHORECTOMY    . PERIPHERAL VASCULAR BALLOON ANGIOPLASTY Right 01/28/2020   Procedure: PERIPHERAL VASCULAR BALLOON ANGIOPLASTY;  Surgeon: Waynetta Sandy, MD;  Location: [place] [facility];  Service: Cardiovascular;  Laterality: Right;  SFA     Current Meds  Medication Sig  . acetaminophen (TYLENOL) 325 MG tablet Take 650 mg by mouth every 6 (six) hours as needed for mild pain or moderate pain.  Marland Kitchen amLODipine (NORVASC) 5 MG tablet TAKE 1 TABLET(5 MG) BY MOUTH DAILY (Patient taking differently: Take 5 mg by mouth daily.)  . aspirin EC 81 MG tablet Take 81 mg by mouth daily. Swallow whole.  . Calcium-Magnesium-Vitamin D (CALCIUM 1200+D3 PO) Take 1 tablet by mouth daily.  . Cholecalciferol (VITAMIN D3) 125 MCG (5000 UT) CAPS Take 5,000 Units by mouth daily.  Marland Kitchen ezetimibe (ZETIA) 10 MG tablet TAKE 1 TABLET(10 MG) BY MOUTH DAILY (Patient taking differently: Take 10 mg by mouth daily.)  . fluticasone (FLONASE) 50 MCG/ACT nasal spray SHAKE LIQUID AND USE 1 SPRAY IN EACH NOSTRIL DAILY (Patient taking differently: Place 1 spray into both nostrils daily as needed for allergies.)  . hydroxychloroquine (PLAQUENIL) 200 MG tablet Take 200 mg by mouth See admin instructions. Take 200 mg twice daily every  3rd day  . metoprolol succinate (TOPROL-XL) 25 MG 24 hr tablet TAKE 1 TABLET(25 MG) BY MOUTH DAILY (Patient taking differently: Take 25 mg by mouth daily.)  . montelukast (SINGULAIR) 10 MG tablet TAKE 1 TABLET(10 MG) BY MOUTH AT BEDTIME (Patient taking differently: Take 10 mg by mouth at bedtime.)  . Multiple Vitamin (MULTIVITAMIN WITH MINERALS) TABS tablet Take 1 tablet by mouth daily.  Marland Kitchen oxybutynin (DITROPAN) 5 MG tablet TAKE 1/2 TABLET(2.5 MG) BY MOUTH TWICE DAILY (Patient taking differently: Take 2.5 mg by mouth 2 (two) times daily.)  . pantoprazole (PROTONIX) 40 MG tablet TAKE 1 TABLET(40 MG) BY MOUTH DAILY (Patient taking differently: Take 40 mg by mouth daily.)  . potassium chloride SA (KLOR-CON) 20 MEQ tablet Take 2 tablets (40 mEq total) by mouth daily. (Patient taking differently: Take 20 mEq by mouth daily.)  . rosuvastatin (CRESTOR) 20 MG tablet TAKE 1 TABLET BY MOUTH EVERY MONDAY, WEDNESDAY, FRIDAY, AND SATURDAY  . torsemide (DEMADEX) 20 MG tablet Take 0.5 tablets (10 mg total) by mouth as needed.  Marland Kitchen UNABLE TO FIND Under pads use  as needed  Pullups use as needed   Length of need- 99 months DX urinary incontinence  . UNABLE TO FIND Incontinence briefs and pads Dx incontinence  . [DISCONTINUED] torsemide (DEMADEX) 20 MG tablet Take 0.5 tablets (10 mg total) by mouth daily.     Allergies:   Ace inhibitors   Social History   Tobacco Use  . Smoking status: Current Every Day Smoker    Packs/day: 0.50    Years: 15.00    Pack years: 7.50    Types: Cigarettes  . Smokeless tobacco: Never Used  . Tobacco comment: quit after hospitized for hip fracture   Vaping Use  . Vaping Use: Never used  Substance Use Topics  . Alcohol use: No    Alcohol/week: 0.0 standard drinks    Comment: quit 6 years ago   . Drug use: No     Family Hx: The patient's family history includes Breast cancer in her sister; Cancer in her brother and father; Colon cancer in her maternal aunt; Heart attack  in her mother. There is no history of Liver disease.  ROS:   Please see the history of present illness.     All other systems reviewed and are negative.   Prior CV studies:   The following studies were reviewed today:  Echocardiogram: 09/2019 IMPRESSIONS    1. Left ventricular ejection fraction, by estimation, is 60 to 65%. The  left ventricle has normal function. The left ventricle has no regional  wall motion abnormalities. There is mild left ventricular hypertrophy.  Left ventricular diastolic parameters  are consistent with Grade I diastolic dysfunction (impaired relaxation).  Elevated left atrial pressure.  2. Right ventricular systolic function is normal. The right ventricular  size is normal.  3. The mitral valve is normal in structure. No evidence of mitral valve  regurgitation. No evidence of mitral stenosis.  4. The aortic valve has an indeterminant number of cusps. Aortic valve  regurgitation is mild. Mild to moderate aortic valve  sclerosis/calcification is present, without any evidence of aortic  stenosis.  5. The inferior vena cava is normal in size with greater than 50%  respiratory variability, suggesting right atrial pressure of 3 mmHg.   Lower Extremity Angiography: 01/2020 Pre-operative Diagnosis: Critical right lower extremity ischemia with wounds Post-operative diagnosis:  Same Surgeon:  Erlene Quan C. Donzetta Matters, MD Procedure Performed: 1.  Ultrasound-guided cannulation left common femoral artery 2.  Aortogram with bilateral lower extremity runoff 3.  Drug-coated balloon angioplasty right popliteal and SFA with two 5 x 40 mm Ranger balloons 4.  Minx device closure left common femoral artery 5.  Moderate sedation with fentanyl Versed for 7 minutes   Indications: 65 year old female with history of stroke she is now nonverbal has limited motion of the right lower extremity.  She has persistent swelling in the right leg she has decreased ABIs she is  indicated for aortogram due to wounds on the right lower extremity.  Findings: Aortoiliac segments are free of flow-limiting stenosis.  The left common femoral reaccessed appears she has heavy calcific disease extending into the proximal SFA.  Right lower extremity which is the site of interest SFA has 1 area at the distal SFA proximal 80% stenosis after angioplasty is down to 0%.  Popliteal artery at the joint demonstrates 80% stenosis resolved to 0%.  She has single-vessel runoff on the right via anterior tibial artery.  The left side she does have heavy calcified disease in the common femoral artery extending in the proximal  SFA no flow-limiting stenosis distally appears to have 3 vessels running to the foot.  Labs/Other Tests and Data Reviewed:    EKG:  An ECG dated 04/22/2020 was personally reviewed today and demonstrated: sinus tachycardia, HR 117 with baseline artifact. No acute ST changes when compared to prior tracings.   Recent Labs: 10/01/2019: B Natriuretic Peptide 80.0 01/10/2020: TSH 1.28 04/22/2020: ALT 37 04/30/2020: BUN 11; Creatinine, Ser 1.00; Potassium 4.0; Sodium 138 05/02/2020: Hemoglobin 9.5; Platelets 231   Recent Lipid Panel Lab Results  Component Value Date/Time   CHOL 100 01/10/2020 09:43 AM   TRIG 195 (H) 01/10/2020 09:43 AM   HDL 24 (L) 01/10/2020 09:43 AM   CHOLHDL 4.2 01/10/2020 09:43 AM   LDLCALC 49 01/10/2020 09:43 AM    Wt Readings from Last 3 Encounters:  05/13/20 175 lb (79.4 kg)  05/12/20 164 lb (74.4 kg)  04/30/20 164 lb 14.5 oz (74.8 kg)     Objective:    Vital Signs:  Ht 5\' 7"  (1.702 m)   Wt 175 lb (79.4 kg)   BMI 27.41 kg/m    General: Vitals reviewed. Physical exam not performed due to this visit being Virtual.    ASSESSMENT & PLAN:    1. CAD - She is s/p prior stenting to LAD and OM in 2005. Her activity has recently been limited due to her recent right AKA but no reports of recent chest pain or dyspnea on exertion.  - Continue current  medication regimen with ASA 81mg  daily, Zetia 10mg  daily, Crestor 20mg  every other day and Toprol-XL 25mg  daily.   2. PAD/Post-op Anemia - She previously underwent prior revascularization of the right SFA with balloon angioplasty and recently required R AKA on 04/30/2020. Her husband reports she has been progressing well since hospital discharge. Followed by Vascular and Orthopedics. Will recheck a CBC given her post-op anemia.  - Her lower extremity edema has since resolved and will therefore reduce Torsemide from 10 mg daily to 10 mg PRN. Continue ASA 81mg  daily and statin therapy.   3. HTN - They were not able to check her BP today but it was well controlled during her recent admission. Continue current medication regimen with Amlodipine 5 mg daily and Toprol-XL 25 mg daily.  4. HLD - FLP in 12/2019 showed total cholesterol 100, HDL 24, triglycerides 195 and LDL 49.  Continue Crestor 20 mg daily and Zetia 10 mg daily.   COVID-19 Education: The signs and symptoms of COVID-19 were discussed with the patient and how to seek care for testing (follow up with PCP or arrange E-visit).  The importance of social distancing was discussed today.  Time:   Today, I have spent 9 minutes with the patient with telehealth technology discussing the above problems.     Medication Adjustments/Labs and Tests Ordered: Current medicines are reviewed at length with the patient today.  Concerns regarding medicines are outlined above.   Tests Ordered: Orders Placed This Encounter  Procedures  . CBC    Medication Changes: Meds ordered this encounter  Medications  . torsemide (DEMADEX) 20 MG tablet    Sig: Take 0.5 tablets (10 mg total) by mouth as needed.    Dispense:  180 tablet    Refill:  3    Follow Up:  In Person in 6 month(s)  Signed, Erma Heritage, PA-C  05/13/2020 5:45 PM    [place]

## 2020-05-13 NOTE — Telephone Encounter (Signed)
  Patient Consent for Virtual Visit         Danielle Cruz has provided verbal consent on 05/13/2020 for a virtual visit (video or telephone).   CONSENT FOR VIRTUAL VISIT FOR:  Danielle Cruz  By participating in this virtual visit I agree to the following:  I hereby voluntarily request, consent and authorize [facility] and its employed or contracted physicians, physician assistants, nurse practitioners or other licensed health care professionals (the Practitioner), to provide me with telemedicine health care services (the "Services") as deemed necessary by the treating Practitioner. I acknowledge and consent to receive the Services by the Practitioner via telemedicine. I understand that the telemedicine visit will involve communicating with the Practitioner through live audiovisual communication technology and the disclosure of certain medical information by electronic transmission. I acknowledge that I have been given the opportunity to request an in-person assessment or other available alternative prior to the telemedicine visit and am voluntarily participating in the telemedicine visit.  I understand that I have the right to withhold or withdraw my consent to the use of telemedicine in the course of my care at any time, without affecting my right to future care or treatment, and that the Practitioner or I may terminate the telemedicine visit at any time. I understand that I have the right to inspect all information obtained and/or recorded in the course of the telemedicine visit and may receive copies of available information for a reasonable fee.  I understand that some of the potential risks of receiving the Services via telemedicine include:  Marland Kitchen Delay or interruption in medical evaluation due to technological equipment failure or disruption; . Information transmitted may not be sufficient (e.g. poor resolution of images) to allow for appropriate medical decision making by the Practitioner;  and/or  . In rare instances, security protocols could fail, causing a breach of personal health information.  Furthermore, I acknowledge that it is my responsibility to provide information about my medical history, conditions and care that is complete and accurate to the best of my ability. I acknowledge that Practitioner's advice, recommendations, and/or decision may be based on factors not within their control, such as incomplete or inaccurate data provided by me or distortions of diagnostic images or specimens that may result from electronic transmissions. I understand that the practice of medicine is not an exact science and that Practitioner makes no warranties or guarantees regarding treatment outcomes. I acknowledge that a copy of this consent can be made available to me via my patient portal ([facility]), or I can request a printed copy by calling the office of [facility].    I understand that my insurance will be billed for this visit.   I have read or had this consent read to me. . I understand the contents of this consent, which adequately explains the benefits and risks of the Services being provided via telemedicine.  . I have been provided ample opportunity to ask questions regarding this consent and the Services and have had my questions answered to my satisfaction. . I give my informed consent for the services to be provided through the use of telemedicine in my medical care

## 2020-05-13 NOTE — Patient Instructions (Signed)
Medication Instructions:  Decrease Torsemide to 10 mg as needed for edema *If you need a refill on your cardiac medications before your next appointment, please call your pharmacy*   Lab Work: CBC  If you have labs (blood work) drawn today and your tests are completely normal, you will receive your results only by: Marland Kitchen MyChart Message (if you have MyChart) OR . A paper copy in the mail If you have any lab test that is abnormal or we need to change your treatment, we will call you to review the results.   Testing/Procedures: None Today    Follow-Up: At [facility], you and your health needs are our priority.  As part of our continuing mission to provide you with exceptional heart care, we have created designated Provider Care Teams.  These Care Teams include your primary Cardiologist (physician) and Advanced Practice Providers (APPs -  Physician Assistants and Nurse Practitioners) who all work together to provide you with the care you need, when you need it.  We recommend signing up for the patient portal called "MyChart".  Sign up information is provided on this After Visit Summary.  MyChart is used to connect with patients for Virtual Visits (Telemedicine).  Patients are able to view lab/test results, encounter notes, upcoming appointments, etc.  Non-urgent messages can be sent to your provider as well.   To learn more about what you can do with MyChart, go to [email or web address].    Your next appointment:   6 month(s)  The format for your next appointment:   In Person  Provider:   You may see Carlyle Dolly, MD or one of the following Advanced Practice Providers on your designated Care Team:    Bernerd Pho, PA-C      Other Instructions None Today

## 2020-05-14 ENCOUNTER — Encounter: Payer: Self-pay | Admitting: Family Medicine

## 2020-05-14 ENCOUNTER — Telehealth (INDEPENDENT_AMBULATORY_CARE_PROVIDER_SITE_OTHER): Payer: Medicare Other | Admitting: Family Medicine

## 2020-05-14 ENCOUNTER — Other Ambulatory Visit: Payer: Self-pay

## 2020-05-14 DIAGNOSIS — I10 Essential (primary) hypertension: Secondary | ICD-10-CM

## 2020-05-14 DIAGNOSIS — Z720 Tobacco use: Secondary | ICD-10-CM | POA: Diagnosis not present

## 2020-05-14 DIAGNOSIS — Z89611 Acquired absence of right leg above knee: Secondary | ICD-10-CM

## 2020-05-14 DIAGNOSIS — E782 Mixed hyperlipidemia: Secondary | ICD-10-CM

## 2020-05-14 DIAGNOSIS — F17210 Nicotine dependence, cigarettes, uncomplicated: Secondary | ICD-10-CM | POA: Diagnosis not present

## 2020-05-14 MED ORDER — FLUTICASONE PROPIONATE 50 MCG/ACT NA SUSP: 2.0000 | Freq: Every day | NASAL | 6 refills | Status: DC

## 2020-05-14 NOTE — Progress Notes (Unsigned)
.  Virtual Visit via Telephone Note  I connected with Danielle Cruz on 05/14/20 at 11:00 AM EST by telephone and verified that I am speaking with the correct person using two identifiers.  Location: Patient: home  Provider: work   I discussed the limitations, risks, security and privacy concerns of performing an evaluation and management service by telephone and the availability of in person appointments. I also discussed with the patient that there may be a patient responsible charge related to this service. The patient expressed understanding and agreed to proceed.   History of Present Illness: Spouse is historian, pt aphasic F/u chronic problems. Recently hospitalized a and has right  AKA due to gangrenous leg, on 04/30/2020 following months of presentation and hospitalization for infected leg. Spouse states the quality of her life and overall health much better following amputation Denies recent fever or chills. Denies sinus pressure, nasal congestion, ear pain or sore throat. Denies chest congestion, productive cough or wheezing. Denies chest pains, palpitations and leg swelling Denies abdominal pain, nausea, vomiting,diarrhea or constipation.   Denies dysuria, frequency, hesitancy  Denies headaches, seizures, numbness, or tingling. Denies depression, anxiety or insomnia. Denies drainage or redness of op site, staples intact and are for removal       Observations/Objective: There were no vitals taken for this visit.    Assessment and Plan: Hypertension DASH diet and commitment to daily physical activity for a minimum of 30 minutes discussed and encouraged, as a part of hypertension management. The importance of attaining a healthy weight is also discussed.  BP/Weight 05/13/2020 05/12/2020 05/05/2020 04/30/2020 04/25/2020 04/23/2020 07/01/9456  Systolic BP - - 592 - 924 462 -  Diastolic BP - - 67 - 80 64 -  Wt. (Lbs) 175 164 - 164.9 151 151.68 165  BMI 27.41 25.69 - 25.83 23.65  23.76 25.84       Hyperlipidemia Hyperlipidemia:Low fat diet discussed and encouraged.   Lipid Panel  Lab Results  Component Value Date   CHOL 100 01/10/2020   HDL 24 (L) 01/10/2020   LDLCALC 49 01/10/2020   TRIG 195 (H) 01/10/2020   CHOLHDL 4.2 01/10/2020   Needs to lower fat intake    Hemiplegia, late effect of cerebrovascular disease (HCC) Stable and unchanged, increased fall risk, home safety addressed  S/P AKA (above knee amputation) unilateral, right (HCC) Healing well per report from spouse with marked improvement in quality of life following surgery  Tobacco abuse Asked:confirms currently smokes cigarettes,  To 7 / day Assess: Unwilling to set a quit date, but is cutting back Advise: needs to QUIT to reduce risk of cancer, cardio and cerebrovascular disease Assist: counseled for 5 minutes and literature provided Arrange: follow up in 2 to 4 months     Follow Up Instructions:    I discussed the assessment and treatment plan with the patient. The patient was provided an opportunity to ask questions and all were answered. The patient agreed with the plan and demonstrated an understanding of the instructions.   The patient was advised to call back or seek an in-person evaluation if the symptoms worsen or if the condition fails to improve as anticipated.  I provided 22 minutes of non-face-to-face time during this encounter.   Tula Nakayama, MD

## 2020-05-14 NOTE — Patient Instructions (Addendum)
Keep f/u appointment as before, call if you need me sooner  Please get your covid booster  Thankful that you are improved and healthier  Please work on quitting  Smoking to improve your health  Thanks for choosing [facility], we consider it a privelige to serve you.

## 2020-05-16 ENCOUNTER — Encounter: Payer: Self-pay | Admitting: Family Medicine

## 2020-05-16 DIAGNOSIS — Z89611 Acquired absence of right leg above knee: Secondary | ICD-10-CM | POA: Insufficient documentation

## 2020-05-16 NOTE — Assessment & Plan Note (Signed)
DASH diet and commitment to daily physical activity for a minimum of 30 minutes discussed and encouraged, as a part of hypertension management. The importance of attaining a healthy weight is also discussed.  BP/Weight 05/13/2020 05/12/2020 05/05/2020 04/30/2020 04/25/2020 04/23/2020 11/55/2080  Systolic BP - - 223 - 361 224 -  Diastolic BP - - 67 - 80 64 -  Wt. (Lbs) 175 164 - 164.9 151 151.68 165  BMI 27.41 25.69 - 25.83 23.65 23.76 25.84

## 2020-05-16 NOTE — Assessment & Plan Note (Signed)
Healing well per report from spouse with marked improvement in quality of life following surgery

## 2020-05-16 NOTE — Assessment & Plan Note (Signed)
Stable and unchanged, increased fall risk, home safety addressed

## 2020-05-16 NOTE — Assessment & Plan Note (Signed)
Asked:confirms currently smokes cigarettes,  To 7 / day Assess: Unwilling to set a quit date, but is cutting back Advise: needs to QUIT to reduce risk of cancer, cardio and cerebrovascular disease Assist: counseled for 5 minutes and literature provided Arrange: follow up in 2 to 4 months

## 2020-05-16 NOTE — Assessment & Plan Note (Signed)
Hyperlipidemia:Low fat diet discussed and encouraged.   Lipid Panel  Lab Results  Component Value Date   CHOL 100 01/10/2020   HDL 24 (L) 01/10/2020   LDLCALC 49 01/10/2020   TRIG 195 (H) 01/10/2020   CHOLHDL 4.2 01/10/2020   Needs to lower fat intake

## 2020-05-19 ENCOUNTER — Encounter: Payer: Self-pay | Admitting: Physician Assistant

## 2020-05-19 ENCOUNTER — Ambulatory Visit (INDEPENDENT_AMBULATORY_CARE_PROVIDER_SITE_OTHER): Payer: Medicare Other | Admitting: Physician Assistant

## 2020-05-19 DIAGNOSIS — I96 Gangrene, not elsewhere classified: Secondary | ICD-10-CM

## 2020-05-19 NOTE — Progress Notes (Signed)
Office Visit Note   Patient: Danielle Cruz           Date of Birth: 08/14/1955           MRN: [id number] Visit Date: 05/19/2020              Requested by: Fayrene Helper, MD [address] [place],  [place] [address] PCP: Fayrene Helper, MD  Chief Complaint  Patient presents with  . Right Leg - Routine Post Op    04/30/20 right AKA       HPI: Patient is status post right above-knee amputation.  She is accompanied by her daughter.  She is doing quite well.  Patient is normally wheelchair-bound and does transfers to her bed and shower as needed.  Assessment & Plan: Visit Diagnoses: No diagnosis found.  Plan: Staples and sutures were removed today.  I have provided them a prescription to work with [employer] to obtain a transfer leg.  We will follow-up for final visit in 4 weeks.  Follow-Up Instructions: No follow-ups on file.   Ortho Exam  Patient is alert, oriented, no adenopathy, well-dressed, normal affect, normal respiratory effort. Above-knee amputation stump is completely healed.  No cellulitis sutures and staples are in place.  Swelling is well controlled.  No drainage. Patient is a new right transfemoral amputee.  Patient is wheelchair-bound secondary to a stroke.  She did however use her right leg for transfers to bed chair and shower.  She would benefit from a transfer leg to continue to do these and help her family.     Imaging: No results found. No images are attached to the encounter.  Labs: Lab Results  Component Value Date   HGBA1C 6.1 (H) 12/22/2019   HGBA1C 5.7 (H) 11/25/2015   HGBA1C 5.6 11/18/2014   ESRSEDRATE 125 (H) 04/22/2020   ESRSEDRATE 72 (H) 12/22/2019   CRP 1.4 (H) 12/22/2019   LABURIC 4.1 06/05/2010   REPTSTATUS 04/27/2020 FINAL 04/22/2020   CULT  04/22/2020    NO GROWTH 5 DAYS Performed at [facility], [address] [place], [place] [address]      Lab Results  Component Value Date   ALBUMIN 2.6 (L) 04/22/2020    ALBUMIN 2.5 (L) 04/01/2020   ALBUMIN 3.3 (L) 10/01/2019   LABURIC 4.1 06/05/2010    Lab Results  Component Value Date   MG 2.0 09/24/2017   MG 1.6 (L) 09/23/2017   MG 1.7 11/25/2015   Lab Results  Component Value Date   VD25OH 85 01/10/2020   VD25OH 59 01/22/2019   VD25OH 34 10/25/2017    No results found for: PREALBUMIN CBC EXTENDED Latest Ref Rng & Units 05/02/2020 05/01/2020 05/01/2020  WBC 4.0 - 10.5 K/uL 13.3(H) - 13.4(H)  RBC 3.87 - 5.11 MIL/uL 2.95(L) - 2.06(L)  HGB 12.0 - 15.0 g/dL 9.5(L) 9.8(L) 6.9(LL)  HCT 36.0 - 46.0 % 29.6(L) 30.4(L) 21.5(L)  PLT 150 - 400 K/uL 231 - 244  NEUTROABS 1.7 - 7.7 K/uL - - -  LYMPHSABS 0.7 - 4.0 K/uL - - -     There is no height or weight on file to calculate BMI.  Orders:  No orders of the defined types were placed in this encounter.  No orders of the defined types were placed in this encounter.    Procedures: No procedures performed  Clinical Data: No additional findings.  ROS:  All other systems negative, except as noted in the HPI. Review of Systems  Objective: Vital Signs: There were no vitals taken for this visit.  Specialty Comments:  No specialty comments available.  PMFS History: Patient Active Problem List   Diagnosis Date Noted  . S/P AKA (above knee amputation) unilateral, right ([place]) 05/16/2020  . History of CVA (cerebrovascular accident)   . Chronic pain 03/28/2020  . Fatty liver 03/28/2020  . Osteoporosis 03/28/2020  . Long term (current) use of opiate analgesic 03/28/2020  . Ischemic ulcer of lower leg due to atherosclerotic disease ([place]) 02/14/2020  . PVD (peripheral vascular disease) ([place]) 12/20/2019  . Incontinence in female 10/05/2019  . Urinary incontinence in female 10/02/2019  . Chronic diastolic CHF (congestive heart failure) ([place]) 10/02/2019  . Diverticulosis of colon 02/04/2018  . Hemorrhoid 02/04/2018  . FH: breast cancer in relative when <44 years old 01/25/2018  . Hematochezia    . GI bleed 10/03/2017  . Hip fracture ([place]) 09/22/2017  . At high risk for falls 01/31/2017  . Disorder of bone and cartilage 05/11/2016  . Normocytic anemia 01/08/2015  . Elevated ferritin 01/08/2015  . Presence of stent in coronary artery in patient with coronary artery disease 12/07/2014  . Fasting hyperglycemia 11/05/2011  . Elevated transaminase level 01/08/2011  . Tobacco abuse   . Rheumatoid arthritis ([place])   . Colonic polyp   . Hemiplegia, late effect of cerebrovascular disease ([place]) 03/16/2010  . Peripheral vascular disease ([place]) 01/14/2010  . Macrocytic anemia 04/01/2009  . Hyperlipidemia 11/14/2008  . Hypertension 2005   Past Medical History:  Diagnosis Date  . AKI (acute kidney injury) ([place]) 09/22/2017  . Anemia   . 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 Arthritis   . Atherosclerosis of native arteries of extremities with gangrene, right leg ([place])   . CHF (congestive heart failure) ([place]) 10/02/2019  . Colonic polyp 2010   Hemorrhoids; h/o mild hematochezia  . CVA (cerebral infarction) 08/2008   sizable left -residual expressive aphasia, right sided weakness; ambulates with difficulty with the right leg brace   . Hyperlipidemia   . Hypertension 2005  . Osteoporosis 03/28/2020  . Rheumatoid arthritis(714.0)   . Stroke [facility])     Family History  Problem Relation Age of Onset  . Cancer Father        prostate, deceased 80s  . Breast cancer Sister        Deceased 68s  . Heart attack Mother        deceased 71, during childbirth  . Cancer Brother        lymphoma  . Colon cancer Maternal Aunt        older than age 74  . Liver disease Neg Hx     Past Surgical History:  Procedure Laterality Date  . ABDOMINAL AORTOGRAM W/LOWER EXTREMITY Bilateral 01/28/2020   Procedure: ABDOMINAL AORTOGRAM W/LOWER EXTREMITY;  Surgeon: Waynetta Sandy, MD;  Location: [facility];  Service:  Cardiovascular;  Laterality: Bilateral;  . ABOVE KNEE LEG AMPUTATION Right 04/30/2020  . AMPUTATION Right 04/30/2020   Procedure: RIGHT ABOVE KNEE AMPUTATION;  Surgeon: [name], MD;  Location: [facility];  Service: Orthopedics;  Laterality: Right;  . ANKLE SURGERY     Right  . CAROTID STENT INSERTION    . COLONOSCOPY W/ POLYPECTOMY  11/2008   Dr. Gala Romney. Left-sided diverticula, Pedunculated polyp snared (no adenomatous changes)  . COLONOSCOPY WITH PROPOFOL N/A 01/12/2018   Procedure: COLONOSCOPY WITH PROPOFOL;  Surgeon: Daneil Dolin, MD;  [facility];  Service: Endoscopy;  Laterality: N/A;  . FEMUR IM NAIL Right 09/23/2017   Procedure: INTRAMEDULLARY (IM) NAIL FEMORAL;  Surgeon: Renette Butters, MD;  Location: [facility];  Service: Orthopedics;  Laterality: Right;  . FOOT SURGERY Right 01/31/2020  . FRACTURE SURGERY     Hip replacement in May 2019 - fall related   . OOPHORECTOMY    . PERIPHERAL VASCULAR BALLOON ANGIOPLASTY Right 01/28/2020   Procedure: PERIPHERAL VASCULAR BALLOON ANGIOPLASTY;  Surgeon: Waynetta Sandy, MD;  Location: [facility];  Service: Cardiovascular;  Laterality: Right;  SFA   Social History   Occupational History  . Occupation: disabilty x 9years    Comment: secondary to arthritis   Tobacco Use  . Smoking status: Current Every Day Smoker    Packs/day: 0.25    Years: 15.00    Pack years: 3.75    Types: Cigarettes  . Smokeless tobacco: Never Used  Vaping Use  . Vaping Use: Never used  Substance and Sexual Activity  . Alcohol use: No    Alcohol/week: 0.0 standard drinks    Comment: quit 6 years ago   . Drug use: No  . Sexual activity: Not on file

## 2020-05-21 ENCOUNTER — Other Ambulatory Visit: Payer: Self-pay

## 2020-05-21 ENCOUNTER — Telehealth (INDEPENDENT_AMBULATORY_CARE_PROVIDER_SITE_OTHER): Payer: Medicare Other | Admitting: Family Medicine

## 2020-05-21 ENCOUNTER — Encounter: Payer: Self-pay | Admitting: Family Medicine

## 2020-05-21 VITALS — Ht 67.0 in | Wt 170.0 lb

## 2020-05-21 DIAGNOSIS — R197 Diarrhea, unspecified: Secondary | ICD-10-CM

## 2020-05-21 DIAGNOSIS — R630 Anorexia: Secondary | ICD-10-CM | POA: Diagnosis not present

## 2020-05-21 DIAGNOSIS — R195 Other fecal abnormalities: Secondary | ICD-10-CM

## 2020-05-21 NOTE — Patient Instructions (Signed)
F/U in 10 to 12  days call if you need me sooner  For hemorrhoids and itching  use preparation H daily for at least 5 days  Need to increase food intake , so eat small amounts more often, and increase fiber, like metamucil daily, Rasin bran cereal daily, and drink at least 6 glasses of water every day  Please take one stool softener every day,colace, this is over the counter

## 2020-05-21 NOTE — Progress Notes (Signed)
Virtual Visit via Telephone Note  I connected with Danielle Cruz on 05/21/20 at  2:40 PM EST by telephone and verified that I am speaking with the correct person using two identifiers.  Location: Patient: home Provider: work   I discussed the limitations, risks, security and privacy concerns of performing an evaluation and management service by telephone and the availability of in person appointments. I also discussed with the patient that there may be a patient responsible charge related to this service. The patient expressed understanding and agreed to proceed.   History of Present Illness: Danielle Cruz , daughter , who  is a CNA is the historian 3 day h/o fecal urgency, small , going once every 30 mins, small amounts , reduced appetite in past 3 days , no stomach pain or nausea, no fever, cough or urinary symptoms C/o perianal itch with prolapsed hemmorhoids , getting some relief  From topical prep, notes that stool when not runny , tends to be hard, and she has to strain     Observations/Objective:  Ht 5\' 7"  (1.702 m)   Wt 170 lb (77.1 kg)   BMI 26.63 kg/m  . Assessment and Plan: Diarrhea Recommend use of daily metamucil, and will continue Prep H for perianal itch and hemorrhoid prolapse  Hard stool Daily stool softener and adequate water intake recommended  Anorexia Encouraged small, frequent meals    Follow Up Instructions:    I discussed the assessment and treatment plan with the patient. The patient was provided an opportunity to ask questions and all were answered. The patient agreed with the plan and demonstrated an understanding of the instructions.   The patient was advised to call back or seek an in-person evaluation if the symptoms worsen or if the condition fails to improve as anticipated.  I provided 14 minutes of non-face-to-face time during this encounter.   Tula Nakayama, MD

## 2020-05-24 ENCOUNTER — Encounter: Payer: Self-pay | Admitting: Family Medicine

## 2020-05-24 DIAGNOSIS — R195 Other fecal abnormalities: Secondary | ICD-10-CM | POA: Insufficient documentation

## 2020-05-24 DIAGNOSIS — R630 Anorexia: Secondary | ICD-10-CM | POA: Insufficient documentation

## 2020-05-24 DIAGNOSIS — R197 Diarrhea, unspecified: Secondary | ICD-10-CM | POA: Insufficient documentation

## 2020-05-24 NOTE — Assessment & Plan Note (Addendum)
Encouraged small, frequent meals

## 2020-05-24 NOTE — Assessment & Plan Note (Signed)
Daily stool softener and adequate water intake recommended

## 2020-05-24 NOTE — Assessment & Plan Note (Signed)
Recommend use of daily metamucil, and will continue Prep H for perianal itch and hemorrhoid prolapse

## 2020-05-30 ENCOUNTER — Other Ambulatory Visit: Payer: Self-pay | Admitting: Family Medicine

## 2020-06-02 DIAGNOSIS — I509 Heart failure, unspecified: Secondary | ICD-10-CM | POA: Diagnosis not present

## 2020-06-02 DIAGNOSIS — I69351 Hemiplegia and hemiparesis following cerebral infarction affecting right dominant side: Secondary | ICD-10-CM | POA: Diagnosis not present

## 2020-06-02 DIAGNOSIS — Z89611 Acquired absence of right leg above knee: Secondary | ICD-10-CM | POA: Diagnosis not present

## 2020-06-02 DIAGNOSIS — M150 Primary generalized (osteo)arthritis: Secondary | ICD-10-CM | POA: Diagnosis not present

## 2020-06-02 DIAGNOSIS — Z7982 Long term (current) use of aspirin: Secondary | ICD-10-CM | POA: Diagnosis not present

## 2020-06-02 DIAGNOSIS — I70261 Atherosclerosis of native arteries of extremities with gangrene, right leg: Secondary | ICD-10-CM | POA: Diagnosis not present

## 2020-06-02 DIAGNOSIS — I69320 Aphasia following cerebral infarction: Secondary | ICD-10-CM | POA: Diagnosis not present

## 2020-06-02 DIAGNOSIS — D649 Anemia, unspecified: Secondary | ICD-10-CM | POA: Diagnosis not present

## 2020-06-02 DIAGNOSIS — Z4781 Encounter for orthopedic aftercare following surgical amputation: Secondary | ICD-10-CM | POA: Diagnosis not present

## 2020-06-02 DIAGNOSIS — M069 Rheumatoid arthritis, unspecified: Secondary | ICD-10-CM | POA: Diagnosis not present

## 2020-06-02 DIAGNOSIS — E785 Hyperlipidemia, unspecified: Secondary | ICD-10-CM | POA: Diagnosis not present

## 2020-06-02 DIAGNOSIS — M810 Age-related osteoporosis without current pathological fracture: Secondary | ICD-10-CM | POA: Diagnosis not present

## 2020-06-02 DIAGNOSIS — F17210 Nicotine dependence, cigarettes, uncomplicated: Secondary | ICD-10-CM | POA: Diagnosis not present

## 2020-06-02 DIAGNOSIS — I110 Hypertensive heart disease with heart failure: Secondary | ICD-10-CM | POA: Diagnosis not present

## 2020-06-02 DIAGNOSIS — Z9181 History of falling: Secondary | ICD-10-CM | POA: Diagnosis not present

## 2020-06-03 ENCOUNTER — Other Ambulatory Visit: Payer: Self-pay

## 2020-06-03 ENCOUNTER — Encounter: Payer: Self-pay | Admitting: Family Medicine

## 2020-06-03 ENCOUNTER — Telehealth (INDEPENDENT_AMBULATORY_CARE_PROVIDER_SITE_OTHER): Payer: Medicare Other | Admitting: Family Medicine

## 2020-06-03 VITALS — Ht 67.0 in | Wt 170.0 lb

## 2020-06-03 DIAGNOSIS — R197 Diarrhea, unspecified: Secondary | ICD-10-CM | POA: Diagnosis not present

## 2020-06-03 DIAGNOSIS — Z720 Tobacco use: Secondary | ICD-10-CM

## 2020-06-03 DIAGNOSIS — F17210 Nicotine dependence, cigarettes, uncomplicated: Secondary | ICD-10-CM | POA: Diagnosis not present

## 2020-06-03 DIAGNOSIS — R630 Anorexia: Secondary | ICD-10-CM | POA: Diagnosis not present

## 2020-06-03 NOTE — Progress Notes (Signed)
Virtual Visit via Telephone Note  I connected with Danielle Cruz on 06/03/20 at  1:20 PM EST by telephone and verified that I am speaking with the correct person using two identifiers.  Location: Patient: home Provider: office  I discussed the limitations, risks, security and privacy concerns of performing an evaluation and management service by telephone and the availability of in person appointments. I also discussed with the patient that there may be a patient responsible charge related to this service. The patient expressed understanding and agreed to proceed.   History of Present Illness: Spouse is the historian, pt incapable, reports doing well, appetite is normal and good, no more loose stool , or rectal itching. No fever, chills , abdominal distension or pain No head or chest congestion. Op site for amputation completely healed, no drainage   Observations/Objective: Ht 5\' 7"  (1.702 m)   Wt 170 lb (77.1 kg)   BMI 26.63 kg/m    Assessment and Plan: Tobacco abuse Asked:confirms currently smokes cigarettes 10/day Assess: Unwilling to set a quit date, but is cutting back Advise: needs to QUIT to reduce risk of cancer, cardio and cerebrovascular disease Assist: counseled for 5 minutes and literature provided Arrange: follow up in 2 to 4 months   Anorexia Resolved per report of her spouse , eating well and is back to normal  Diarrhea Resolved per puse, has 2 formed BM / day, not hard, no perianal itching observed    Follow Up Instructions:    I discussed the assessment and treatment plan with the patient. The patient was provided an opportunity to ask questions and all were answered. The patient agreed with the plan and demonstrated an understanding of the instructions.   The patient was advised to call back or seek an in-person evaluation if the symptoms worsen or if the condition fails to improve as anticipated.  I provided 9 minutes of non-face-to-face time during  this encounter.   Tula Nakayama, MD

## 2020-06-03 NOTE — Assessment & Plan Note (Signed)
Resolved per report of her spouse , eating well and is back to normal

## 2020-06-03 NOTE — Assessment & Plan Note (Signed)
Asked:confirms currently smokes cigarettes 10/day Assess: Unwilling to set a quit date, but is cutting back Advise: needs to QUIT to reduce risk of cancer, cardio and cerebrovascular disease Assist: counseled for 5 minutes and literature provided Arrange: follow up in 2 to 4 months  

## 2020-06-03 NOTE — Patient Instructions (Addendum)
F/U as before, call if you need me sooner  Thankful you are much better, good appetite and normal bowel movents  Now smoking 10 cigarettes / day, please work on cutting down each month with a view to quitting   Steps to Quit Smoking Smoking tobacco is the leading cause of preventable death. It can affect almost every organ in the body. Smoking puts you and people around you at risk for many serious, long-lasting (chronic) diseases. Quitting smoking can be hard, but it is one of the best things that you can do for your health. It is never too late to quit. How do I get ready to quit? When you decide to quit smoking, make a plan to help you succeed. Before you quit:  Pick a date to quit. Set a date within the next 2 weeks to give you time to prepare.  Write down the reasons why you are quitting. Keep this list in places where you will see it often.  Tell your family, friends, and co-workers that you are quitting. Their support is important.  Talk with your doctor about the choices that may help you quit.  Find out if your health insurance will pay for these treatments.  Know the people, places, things, and activities that make you want to smoke (triggers). Avoid them. What first steps can I take to quit smoking?  Throw away all cigarettes at home, at work, and in your car.  Throw away the things that you use when you smoke, such as ashtrays and lighters.  Clean your car. Make sure to empty the ashtray.  Clean your home, including curtains and carpets. What can I do to help me quit smoking? Talk with your doctor about taking medicines and seeing a counselor at the same time. You are more likely to succeed when you do both.  If you are pregnant or breastfeeding, talk with your doctor about counseling or other ways to quit smoking. Do not take medicine to help you quit smoking unless your doctor tells you to do so. To quit smoking: Quit right away  Quit smoking totally, instead of  slowly cutting back on how much you smoke over a period of time.  Go to counseling. You are more likely to quit if you go to counseling sessions regularly. Take medicine You may take medicines to help you quit. Some medicines need a prescription, and some you can buy over-the-counter. Some medicines may contain a drug called nicotine to replace the nicotine in cigarettes. Medicines may:  Help you to stop having the desire to smoke (cravings).  Help to stop the problems that come when you stop smoking (withdrawal symptoms). Your doctor may ask you to use:  Nicotine patches, gum, or lozenges.  Nicotine inhalers or sprays.  Non-nicotine medicine that is taken by mouth. Find resources Find resources and other ways to help you quit smoking and remain smoke-free after you quit. These resources are most helpful when you use them often. They include:  Online chats with a Social worker.  Phone quitlines.  Printed Furniture conservator/restorer.  Support groups or group counseling.  Text messaging programs.  Mobile phone apps. Use apps on your mobile phone or tablet that can help you stick to your quit plan. There are many free apps for mobile phones and tablets as well as websites. Examples include Quit Guide from the [employer] and smokefree.gov   What things can I do to make it easier to quit?  Talk to your family and friends.  Ask them to support and encourage you.  Call a phone quitline (1-800-QUIT-NOW), reach out to support groups, or work with a Social worker.  Ask people who smoke to not smoke around you.  Avoid places that make you want to smoke, such as: ? Bars. ? Parties. ? Smoke-break areas at work.  Spend time with people who do not smoke.  Lower the stress in your life. Stress can make you want to smoke. Try these things to help your stress: ? Getting regular exercise. ? Doing deep-breathing exercises. ? Doing yoga. ? Meditating. ? Doing a body scan. To do this, close your eyes, focus on  one area of your body at a time from head to toe. Notice which parts of your body are tense. Try to relax the muscles in those areas.   How will I feel when I quit smoking? Day 1 to 3 weeks Within the first 24 hours, you may start to have some problems that come from quitting tobacco. These problems are very bad 2-3 days after you quit, but they do not often last for more than 2-3 weeks. You may get these symptoms:  Mood swings.  Feeling restless, nervous, angry, or annoyed.  Trouble concentrating.  Dizziness.  Strong desire for high-sugar foods and nicotine.  Weight gain.  Trouble pooping (constipation).  Feeling like you may vomit (nausea).  Coughing or a sore throat.  Changes in how the medicines that you take for other issues work in your body.  Depression.  Trouble sleeping (insomnia). Week 3 and afterward After the first 2-3 weeks of quitting, you may start to notice more positive results, such as:  Better sense of smell and taste.  Less coughing and sore throat.  Slower heart rate.  Lower blood pressure.  Clearer skin.  Better breathing.  Fewer sick days. Quitting smoking can be hard. Do not give up if you fail the first time. Some people need to try a few times before they succeed. Do your best to stick to your quit plan, and talk with your doctor if you have any questions or concerns. Summary  Smoking tobacco is the leading cause of preventable death. Quitting smoking can be hard, but it is one of the best things that you can do for your health.  When you decide to quit smoking, make a plan to help you succeed.  Quit smoking right away, not slowly over a period of time.  When you start quitting, seek help from your doctor, family, or friends. This information is not intended to replace advice given to you by your health care provider. Make sure you discuss any questions you have with your health care provider. Document Revised: 12/29/2018 Document  Reviewed: 06/24/2018 Elsevier Patient Education  [place].

## 2020-06-03 NOTE — Assessment & Plan Note (Signed)
Resolved per puse, has 2 formed BM / day, not hard, no perianal itching observed

## 2020-06-04 ENCOUNTER — Telehealth: Payer: Medicare Other | Admitting: Family Medicine

## 2020-06-06 DIAGNOSIS — Z4781 Encounter for orthopedic aftercare following surgical amputation: Secondary | ICD-10-CM | POA: Diagnosis not present

## 2020-06-06 DIAGNOSIS — I70261 Atherosclerosis of native arteries of extremities with gangrene, right leg: Secondary | ICD-10-CM | POA: Diagnosis not present

## 2020-06-06 DIAGNOSIS — I110 Hypertensive heart disease with heart failure: Secondary | ICD-10-CM | POA: Diagnosis not present

## 2020-06-06 DIAGNOSIS — I69351 Hemiplegia and hemiparesis following cerebral infarction affecting right dominant side: Secondary | ICD-10-CM | POA: Diagnosis not present

## 2020-06-06 DIAGNOSIS — I509 Heart failure, unspecified: Secondary | ICD-10-CM | POA: Diagnosis not present

## 2020-06-06 DIAGNOSIS — I69320 Aphasia following cerebral infarction: Secondary | ICD-10-CM | POA: Diagnosis not present

## 2020-06-09 ENCOUNTER — Telehealth: Payer: Self-pay

## 2020-06-09 NOTE — Telephone Encounter (Signed)
Amy neefds verbal orders for PT for Danielle Cruz  Please call with the OK , and you can leave this on the VM

## 2020-06-10 DIAGNOSIS — I110 Hypertensive heart disease with heart failure: Secondary | ICD-10-CM | POA: Diagnosis not present

## 2020-06-10 DIAGNOSIS — I509 Heart failure, unspecified: Secondary | ICD-10-CM | POA: Diagnosis not present

## 2020-06-10 DIAGNOSIS — I69351 Hemiplegia and hemiparesis following cerebral infarction affecting right dominant side: Secondary | ICD-10-CM | POA: Diagnosis not present

## 2020-06-10 DIAGNOSIS — Z4781 Encounter for orthopedic aftercare following surgical amputation: Secondary | ICD-10-CM | POA: Diagnosis not present

## 2020-06-10 DIAGNOSIS — I69320 Aphasia following cerebral infarction: Secondary | ICD-10-CM | POA: Diagnosis not present

## 2020-06-10 DIAGNOSIS — I70261 Atherosclerosis of native arteries of extremities with gangrene, right leg: Secondary | ICD-10-CM | POA: Diagnosis not present

## 2020-06-10 NOTE — Telephone Encounter (Signed)
Done

## 2020-06-12 DIAGNOSIS — I69320 Aphasia following cerebral infarction: Secondary | ICD-10-CM | POA: Diagnosis not present

## 2020-06-12 DIAGNOSIS — I509 Heart failure, unspecified: Secondary | ICD-10-CM | POA: Diagnosis not present

## 2020-06-12 DIAGNOSIS — Z4781 Encounter for orthopedic aftercare following surgical amputation: Secondary | ICD-10-CM | POA: Diagnosis not present

## 2020-06-12 DIAGNOSIS — I70261 Atherosclerosis of native arteries of extremities with gangrene, right leg: Secondary | ICD-10-CM | POA: Diagnosis not present

## 2020-06-12 DIAGNOSIS — I69351 Hemiplegia and hemiparesis following cerebral infarction affecting right dominant side: Secondary | ICD-10-CM | POA: Diagnosis not present

## 2020-06-12 DIAGNOSIS — I110 Hypertensive heart disease with heart failure: Secondary | ICD-10-CM | POA: Diagnosis not present

## 2020-06-16 ENCOUNTER — Ambulatory Visit (INDEPENDENT_AMBULATORY_CARE_PROVIDER_SITE_OTHER): Payer: Medicare Other | Admitting: Physician Assistant

## 2020-06-16 ENCOUNTER — Encounter: Payer: Self-pay | Admitting: Orthopedic Surgery

## 2020-06-16 DIAGNOSIS — I96 Gangrene, not elsewhere classified: Secondary | ICD-10-CM

## 2020-06-16 NOTE — Progress Notes (Signed)
Office Visit Note   Patient: Danielle Cruz           Date of Birth: May 03, 1955           MRN: [id number] Visit Date: 06/16/2020              Requested by: Fayrene Helper, MD [address] [place],  [place] [address] PCP: Fayrene Helper, MD  Chief Complaint  Patient presents with  . Right Leg - Routine Post Op    05/10/20 right AKA       HPI: Patient is a pleasant 65 year old woman who is 6 weeks status post right above-knee amputation.  She is accompanied by her husband who thinks she is doing quite well.  She does transfers and is wheelchair-bound as she was prior to surgery.  Husband is not interested in a prosthetic.  The only thing he would like is a prescription for smaller shrinkers  Assessment & Plan: Visit Diagnoses: No diagnosis found.  Plan: Patient may follow-up as needed.  I emphasized the importance if she notices her he notices anything with her other leg to contact us immediately  Follow-Up Instructions: No follow-ups on file.   Ortho Exam  Patient is alert, oriented, no adenopathy, well-dressed, normal affect, normal respiratory effort. Examination of the amputation stump she did have 1 retained staple which was removed today.  Otherwise well apposed wound edges no swelling no erythema no drainage nontender to palpation patient also appears well  Imaging: No results found. No images are attached to the encounter.  Labs: Lab Results  Component Value Date   HGBA1C 6.1 (H) 12/22/2019   HGBA1C 5.7 (H) 11/25/2015   HGBA1C 5.6 11/18/2014   ESRSEDRATE 125 (H) 04/22/2020   ESRSEDRATE 72 (H) 12/22/2019   CRP 1.4 (H) 12/22/2019   LABURIC 4.1 06/05/2010   REPTSTATUS 04/27/2020 FINAL 04/22/2020   CULT  04/22/2020    NO GROWTH 5 DAYS Performed at [facility], [address] [place], [place] [address]      Lab Results  Component Value Date   ALBUMIN 2.6 (L) 04/22/2020   ALBUMIN 2.5 (L) 04/01/2020   ALBUMIN 3.3 (L) 10/01/2019   LABURIC  4.1 06/05/2010    Lab Results  Component Value Date   MG 2.0 09/24/2017   MG 1.6 (L) 09/23/2017   MG 1.7 11/25/2015   Lab Results  Component Value Date   VD25OH 85 01/10/2020   VD25OH 59 01/22/2019   VD25OH 34 10/25/2017    No results found for: PREALBUMIN CBC EXTENDED Latest Ref Rng & Units 05/02/2020 05/01/2020 05/01/2020  WBC 4.0 - 10.5 K/uL 13.3(H) - 13.4(H)  RBC 3.87 - 5.11 MIL/uL 2.95(L) - 2.06(L)  HGB 12.0 - 15.0 g/dL 9.5(L) 9.8(L) 6.9(LL)  HCT 36.0 - 46.0 % 29.6(L) 30.4(L) 21.5(L)  PLT 150 - 400 K/uL 231 - 244  NEUTROABS 1.7 - 7.7 K/uL - - -  LYMPHSABS 0.7 - 4.0 K/uL - - -     There is no height or weight on file to calculate BMI.  Orders:  No orders of the defined types were placed in this encounter.  No orders of the defined types were placed in this encounter.    Procedures: No procedures performed  Clinical Data: No additional findings.  ROS:  All other systems negative, except as noted in the HPI. Review of Systems  Objective: Vital Signs: There were no vitals taken for this visit.  Specialty Comments:  No specialty comments  available.  PMFS History: Patient Active Problem List   Diagnosis Date Noted  . Diarrhea 05/24/2020  . Hard stool 05/24/2020  . Anorexia 05/24/2020  . S/P AKA (above knee amputation) unilateral, right ([place]) 05/16/2020  . History of CVA (cerebrovascular accident)   . Chronic pain 03/28/2020  . Fatty liver 03/28/2020  . Osteoporosis 03/28/2020  . Long term (current) use of opiate analgesic 03/28/2020  . Ischemic ulcer of lower leg due to atherosclerotic disease ([place]) 02/14/2020  . PVD (peripheral vascular disease) ([place]) 12/20/2019  . Incontinence in female 10/05/2019  . Urinary incontinence in female 10/02/2019  . Chronic diastolic CHF (congestive heart failure) ([place]) 10/02/2019  . Diverticulosis of colon 02/04/2018  . Hemorrhoid 02/04/2018  . FH: breast cancer in relative when <92 years old 01/25/2018  .  Hematochezia   . GI bleed 10/03/2017  . Hip fracture ([place]) 09/22/2017  . At high risk for falls 01/31/2017  . Disorder of bone and cartilage 05/11/2016  . Normocytic anemia 01/08/2015  . Elevated ferritin 01/08/2015  . Presence of stent in coronary artery in patient with coronary artery disease 12/07/2014  . Fasting hyperglycemia 11/05/2011  . Elevated transaminase level 01/08/2011  . Tobacco abuse   . Rheumatoid arthritis ([place])   . Colonic polyp   . Hemiplegia, late effect of cerebrovascular disease ([place]) 03/16/2010  . Peripheral vascular disease ([place]) 01/14/2010  . Macrocytic anemia 04/01/2009  . Hyperlipidemia 11/14/2008  . Hypertension 2005   Past Medical History:  Diagnosis Date  . AKI (acute kidney injury) ([place]) 09/22/2017  . Anemia   . 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 Arthritis   . Atherosclerosis of native arteries of extremities with gangrene, right leg ([place])   . CHF (congestive heart failure) ([place]) 10/02/2019  . Colonic polyp 2010   Hemorrhoids; h/o mild hematochezia  . CVA (cerebral infarction) 08/2008   sizable left -residual expressive aphasia, right sided weakness; ambulates with difficulty with the right leg brace   . Hyperlipidemia   . Hypertension 2005  . Osteoporosis 03/28/2020  . Rheumatoid arthritis(714.0)   . Stroke [facility])     Family History  Problem Relation Age of Onset  . Cancer Father        prostate, deceased 40s  . Breast cancer Sister        Deceased 82s  . Heart attack Mother        deceased 21, during childbirth  . Cancer Brother        lymphoma  . Colon cancer Maternal Aunt        older than age 36  . Liver disease Neg Hx     Past Surgical History:  Procedure Laterality Date  . ABDOMINAL AORTOGRAM W/LOWER EXTREMITY Bilateral 01/28/2020   Procedure: ABDOMINAL AORTOGRAM W/LOWER EXTREMITY;  Surgeon: Waynetta Sandy, MD;  Location: [facility];   Service: Cardiovascular;  Laterality: Bilateral;  . ABOVE KNEE LEG AMPUTATION Right 04/30/2020  . AMPUTATION Right 04/30/2020   Procedure: RIGHT ABOVE KNEE AMPUTATION;  Surgeon: [name], MD;  Location: [facility];  Service: Orthopedics;  Laterality: Right;  . ANKLE SURGERY     Right  . CAROTID STENT INSERTION    . COLONOSCOPY W/ POLYPECTOMY  11/2008   Dr. Gala Romney. Left-sided diverticula, Pedunculated polyp snared (no adenomatous changes)  . COLONOSCOPY WITH PROPOFOL N/A 01/12/2018   Procedure: COLONOSCOPY WITH PROPOFOL;  Surgeon: Daneil Dolin, MD;  [facility];  Service: Endoscopy;  Laterality: N/A;  . FEMUR IM NAIL Right 09/23/2017   Procedure: INTRAMEDULLARY (IM) NAIL FEMORAL;  Surgeon: Renette Butters, MD;  Location: [facility];  Service: Orthopedics;  Laterality: Right;  . FOOT SURGERY Right 01/31/2020  . FRACTURE SURGERY     Hip replacement in May 2019 - fall related   . OOPHORECTOMY    . PERIPHERAL VASCULAR BALLOON ANGIOPLASTY Right 01/28/2020   Procedure: PERIPHERAL VASCULAR BALLOON ANGIOPLASTY;  Surgeon: Waynetta Sandy, MD;  Location: [facility];  Service: Cardiovascular;  Laterality: Right;  SFA   Social History   Occupational History  . Occupation: disabilty x 9years    Comment: secondary to arthritis   Tobacco Use  . Smoking status: Current Every Day Smoker    Packs/day: 0.25    Years: 15.00    Pack years: 3.75    Types: Cigarettes  . Smokeless tobacco: Never Used  Vaping Use  . Vaping Use: Never used  Substance and Sexual Activity  . Alcohol use: No    Alcohol/week: 0.0 standard drinks    Comment: quit 6 years ago   . Drug use: No  . Sexual activity: Not on file

## 2020-06-17 DIAGNOSIS — I70261 Atherosclerosis of native arteries of extremities with gangrene, right leg: Secondary | ICD-10-CM | POA: Diagnosis not present

## 2020-06-17 DIAGNOSIS — Z4781 Encounter for orthopedic aftercare following surgical amputation: Secondary | ICD-10-CM | POA: Diagnosis not present

## 2020-06-17 DIAGNOSIS — I110 Hypertensive heart disease with heart failure: Secondary | ICD-10-CM | POA: Diagnosis not present

## 2020-06-17 DIAGNOSIS — I69351 Hemiplegia and hemiparesis following cerebral infarction affecting right dominant side: Secondary | ICD-10-CM | POA: Diagnosis not present

## 2020-06-17 DIAGNOSIS — I69320 Aphasia following cerebral infarction: Secondary | ICD-10-CM | POA: Diagnosis not present

## 2020-06-17 DIAGNOSIS — I509 Heart failure, unspecified: Secondary | ICD-10-CM | POA: Diagnosis not present

## 2020-06-19 DIAGNOSIS — M069 Rheumatoid arthritis, unspecified: Secondary | ICD-10-CM | POA: Diagnosis not present

## 2020-06-19 DIAGNOSIS — I70261 Atherosclerosis of native arteries of extremities with gangrene, right leg: Secondary | ICD-10-CM | POA: Diagnosis not present

## 2020-06-19 DIAGNOSIS — D649 Anemia, unspecified: Secondary | ICD-10-CM | POA: Diagnosis not present

## 2020-06-19 DIAGNOSIS — I69351 Hemiplegia and hemiparesis following cerebral infarction affecting right dominant side: Secondary | ICD-10-CM | POA: Diagnosis not present

## 2020-06-19 DIAGNOSIS — I509 Heart failure, unspecified: Secondary | ICD-10-CM | POA: Diagnosis not present

## 2020-06-19 DIAGNOSIS — Z4781 Encounter for orthopedic aftercare following surgical amputation: Secondary | ICD-10-CM | POA: Diagnosis not present

## 2020-06-19 DIAGNOSIS — I110 Hypertensive heart disease with heart failure: Secondary | ICD-10-CM | POA: Diagnosis not present

## 2020-06-19 DIAGNOSIS — M150 Primary generalized (osteo)arthritis: Secondary | ICD-10-CM | POA: Diagnosis not present

## 2020-06-19 DIAGNOSIS — E785 Hyperlipidemia, unspecified: Secondary | ICD-10-CM | POA: Diagnosis not present

## 2020-06-19 DIAGNOSIS — I69320 Aphasia following cerebral infarction: Secondary | ICD-10-CM | POA: Diagnosis not present

## 2020-06-23 DIAGNOSIS — R748 Abnormal levels of other serum enzymes: Secondary | ICD-10-CM | POA: Diagnosis not present

## 2020-06-23 DIAGNOSIS — M0579 Rheumatoid arthritis with rheumatoid factor of multiple sites without organ or systems involvement: Secondary | ICD-10-CM | POA: Diagnosis not present

## 2020-06-23 DIAGNOSIS — Z79899 Other long term (current) drug therapy: Secondary | ICD-10-CM | POA: Diagnosis not present

## 2020-06-23 DIAGNOSIS — M810 Age-related osteoporosis without current pathological fracture: Secondary | ICD-10-CM | POA: Diagnosis not present

## 2020-06-23 DIAGNOSIS — Z79891 Long term (current) use of opiate analgesic: Secondary | ICD-10-CM | POA: Diagnosis not present

## 2020-06-23 DIAGNOSIS — G8929 Other chronic pain: Secondary | ICD-10-CM | POA: Diagnosis not present

## 2020-06-23 DIAGNOSIS — M2550 Pain in unspecified joint: Secondary | ICD-10-CM | POA: Diagnosis not present

## 2020-06-23 DIAGNOSIS — K760 Fatty (change of) liver, not elsewhere classified: Secondary | ICD-10-CM | POA: Diagnosis not present

## 2020-06-24 DIAGNOSIS — I69351 Hemiplegia and hemiparesis following cerebral infarction affecting right dominant side: Secondary | ICD-10-CM | POA: Diagnosis not present

## 2020-06-24 DIAGNOSIS — I69320 Aphasia following cerebral infarction: Secondary | ICD-10-CM | POA: Diagnosis not present

## 2020-06-24 DIAGNOSIS — Z79891 Long term (current) use of opiate analgesic: Secondary | ICD-10-CM | POA: Diagnosis not present

## 2020-06-24 DIAGNOSIS — I110 Hypertensive heart disease with heart failure: Secondary | ICD-10-CM | POA: Diagnosis not present

## 2020-06-24 DIAGNOSIS — I509 Heart failure, unspecified: Secondary | ICD-10-CM | POA: Diagnosis not present

## 2020-06-24 DIAGNOSIS — I70261 Atherosclerosis of native arteries of extremities with gangrene, right leg: Secondary | ICD-10-CM | POA: Diagnosis not present

## 2020-06-24 DIAGNOSIS — Z4781 Encounter for orthopedic aftercare following surgical amputation: Secondary | ICD-10-CM | POA: Diagnosis not present

## 2020-06-26 DIAGNOSIS — I69351 Hemiplegia and hemiparesis following cerebral infarction affecting right dominant side: Secondary | ICD-10-CM | POA: Diagnosis not present

## 2020-06-26 DIAGNOSIS — I110 Hypertensive heart disease with heart failure: Secondary | ICD-10-CM | POA: Diagnosis not present

## 2020-06-26 DIAGNOSIS — I70261 Atherosclerosis of native arteries of extremities with gangrene, right leg: Secondary | ICD-10-CM | POA: Diagnosis not present

## 2020-06-26 DIAGNOSIS — Z4781 Encounter for orthopedic aftercare following surgical amputation: Secondary | ICD-10-CM | POA: Diagnosis not present

## 2020-06-26 DIAGNOSIS — I509 Heart failure, unspecified: Secondary | ICD-10-CM | POA: Diagnosis not present

## 2020-06-26 DIAGNOSIS — I69320 Aphasia following cerebral infarction: Secondary | ICD-10-CM | POA: Diagnosis not present

## 2020-07-01 DIAGNOSIS — Z4781 Encounter for orthopedic aftercare following surgical amputation: Secondary | ICD-10-CM | POA: Diagnosis not present

## 2020-07-01 DIAGNOSIS — I69320 Aphasia following cerebral infarction: Secondary | ICD-10-CM | POA: Diagnosis not present

## 2020-07-01 DIAGNOSIS — I70261 Atherosclerosis of native arteries of extremities with gangrene, right leg: Secondary | ICD-10-CM | POA: Diagnosis not present

## 2020-07-01 DIAGNOSIS — I509 Heart failure, unspecified: Secondary | ICD-10-CM | POA: Diagnosis not present

## 2020-07-01 DIAGNOSIS — I110 Hypertensive heart disease with heart failure: Secondary | ICD-10-CM | POA: Diagnosis not present

## 2020-07-01 DIAGNOSIS — I69351 Hemiplegia and hemiparesis following cerebral infarction affecting right dominant side: Secondary | ICD-10-CM | POA: Diagnosis not present

## 2020-07-02 DIAGNOSIS — I70261 Atherosclerosis of native arteries of extremities with gangrene, right leg: Secondary | ICD-10-CM | POA: Diagnosis not present

## 2020-07-02 DIAGNOSIS — F17210 Nicotine dependence, cigarettes, uncomplicated: Secondary | ICD-10-CM | POA: Diagnosis not present

## 2020-07-02 DIAGNOSIS — I69320 Aphasia following cerebral infarction: Secondary | ICD-10-CM | POA: Diagnosis not present

## 2020-07-02 DIAGNOSIS — I509 Heart failure, unspecified: Secondary | ICD-10-CM | POA: Diagnosis not present

## 2020-07-02 DIAGNOSIS — M810 Age-related osteoporosis without current pathological fracture: Secondary | ICD-10-CM | POA: Diagnosis not present

## 2020-07-02 DIAGNOSIS — Z4781 Encounter for orthopedic aftercare following surgical amputation: Secondary | ICD-10-CM | POA: Diagnosis not present

## 2020-07-02 DIAGNOSIS — Z7982 Long term (current) use of aspirin: Secondary | ICD-10-CM | POA: Diagnosis not present

## 2020-07-02 DIAGNOSIS — M150 Primary generalized (osteo)arthritis: Secondary | ICD-10-CM | POA: Diagnosis not present

## 2020-07-02 DIAGNOSIS — E785 Hyperlipidemia, unspecified: Secondary | ICD-10-CM | POA: Diagnosis not present

## 2020-07-02 DIAGNOSIS — I110 Hypertensive heart disease with heart failure: Secondary | ICD-10-CM | POA: Diagnosis not present

## 2020-07-02 DIAGNOSIS — M069 Rheumatoid arthritis, unspecified: Secondary | ICD-10-CM | POA: Diagnosis not present

## 2020-07-02 DIAGNOSIS — I69351 Hemiplegia and hemiparesis following cerebral infarction affecting right dominant side: Secondary | ICD-10-CM | POA: Diagnosis not present

## 2020-07-02 DIAGNOSIS — Z9181 History of falling: Secondary | ICD-10-CM | POA: Diagnosis not present

## 2020-07-02 DIAGNOSIS — D649 Anemia, unspecified: Secondary | ICD-10-CM | POA: Diagnosis not present

## 2020-07-02 DIAGNOSIS — Z89611 Acquired absence of right leg above knee: Secondary | ICD-10-CM | POA: Diagnosis not present

## 2020-07-08 DIAGNOSIS — I69320 Aphasia following cerebral infarction: Secondary | ICD-10-CM | POA: Diagnosis not present

## 2020-07-08 DIAGNOSIS — I509 Heart failure, unspecified: Secondary | ICD-10-CM | POA: Diagnosis not present

## 2020-07-08 DIAGNOSIS — I69351 Hemiplegia and hemiparesis following cerebral infarction affecting right dominant side: Secondary | ICD-10-CM | POA: Diagnosis not present

## 2020-07-08 DIAGNOSIS — I110 Hypertensive heart disease with heart failure: Secondary | ICD-10-CM | POA: Diagnosis not present

## 2020-07-08 DIAGNOSIS — Z4781 Encounter for orthopedic aftercare following surgical amputation: Secondary | ICD-10-CM | POA: Diagnosis not present

## 2020-07-08 DIAGNOSIS — I70261 Atherosclerosis of native arteries of extremities with gangrene, right leg: Secondary | ICD-10-CM | POA: Diagnosis not present

## 2020-07-15 DIAGNOSIS — I509 Heart failure, unspecified: Secondary | ICD-10-CM | POA: Diagnosis not present

## 2020-07-15 DIAGNOSIS — I69320 Aphasia following cerebral infarction: Secondary | ICD-10-CM | POA: Diagnosis not present

## 2020-07-15 DIAGNOSIS — I69351 Hemiplegia and hemiparesis following cerebral infarction affecting right dominant side: Secondary | ICD-10-CM | POA: Diagnosis not present

## 2020-07-15 DIAGNOSIS — Z4781 Encounter for orthopedic aftercare following surgical amputation: Secondary | ICD-10-CM | POA: Diagnosis not present

## 2020-07-15 DIAGNOSIS — I70261 Atherosclerosis of native arteries of extremities with gangrene, right leg: Secondary | ICD-10-CM | POA: Diagnosis not present

## 2020-07-15 DIAGNOSIS — I110 Hypertensive heart disease with heart failure: Secondary | ICD-10-CM | POA: Diagnosis not present

## 2020-07-24 ENCOUNTER — Other Ambulatory Visit: Payer: Self-pay | Admitting: Family Medicine

## 2020-07-24 ENCOUNTER — Telehealth: Payer: Self-pay | Admitting: Student

## 2020-07-24 DIAGNOSIS — Z4781 Encounter for orthopedic aftercare following surgical amputation: Secondary | ICD-10-CM | POA: Diagnosis not present

## 2020-07-24 DIAGNOSIS — I69320 Aphasia following cerebral infarction: Secondary | ICD-10-CM | POA: Diagnosis not present

## 2020-07-24 DIAGNOSIS — I70261 Atherosclerosis of native arteries of extremities with gangrene, right leg: Secondary | ICD-10-CM | POA: Diagnosis not present

## 2020-07-24 DIAGNOSIS — I110 Hypertensive heart disease with heart failure: Secondary | ICD-10-CM | POA: Diagnosis not present

## 2020-07-24 DIAGNOSIS — I509 Heart failure, unspecified: Secondary | ICD-10-CM | POA: Diagnosis not present

## 2020-07-24 DIAGNOSIS — I69351 Hemiplegia and hemiparesis following cerebral infarction affecting right dominant side: Secondary | ICD-10-CM | POA: Diagnosis not present

## 2020-07-24 NOTE — Telephone Encounter (Signed)
I will forward question to B.Ahmed Prima, PA-C for disposition.

## 2020-07-24 NOTE — Telephone Encounter (Signed)
Patient was on Plavix, stopped at hospital discharge 1/77/22. Today sister said it was in her refills from pill pack from [facility]. I will call them to clarify that patient is not on plavix. (she has not been on it since hospital discharge)

## 2020-07-24 NOTE — Telephone Encounter (Signed)
New message   Does patient need to continue blood thinners since they amputated the patients leg?  Dr Donzetta Matters office said to call us to find out if we wanted her to continue them?

## 2020-07-25 ENCOUNTER — Other Ambulatory Visit: Payer: Self-pay | Admitting: Family Medicine

## 2020-07-25 DIAGNOSIS — E785 Hyperlipidemia, unspecified: Secondary | ICD-10-CM

## 2020-07-25 DIAGNOSIS — Z1231 Encounter for screening mammogram for malignant neoplasm of breast: Secondary | ICD-10-CM

## 2020-07-25 DIAGNOSIS — I10 Essential (primary) hypertension: Secondary | ICD-10-CM

## 2020-07-29 ENCOUNTER — Other Ambulatory Visit: Payer: Self-pay | Admitting: Family Medicine

## 2020-09-12 ENCOUNTER — Other Ambulatory Visit: Payer: Self-pay | Admitting: Cardiology

## 2020-09-18 ENCOUNTER — Other Ambulatory Visit: Payer: Self-pay | Admitting: Cardiology

## 2020-09-24 DIAGNOSIS — G8929 Other chronic pain: Secondary | ICD-10-CM | POA: Diagnosis not present

## 2020-09-24 DIAGNOSIS — R748 Abnormal levels of other serum enzymes: Secondary | ICD-10-CM | POA: Diagnosis not present

## 2020-09-24 DIAGNOSIS — Z79891 Long term (current) use of opiate analgesic: Secondary | ICD-10-CM | POA: Diagnosis not present

## 2020-09-24 DIAGNOSIS — K760 Fatty (change of) liver, not elsewhere classified: Secondary | ICD-10-CM | POA: Diagnosis not present

## 2020-09-24 DIAGNOSIS — Z79899 Other long term (current) drug therapy: Secondary | ICD-10-CM | POA: Diagnosis not present

## 2020-09-24 DIAGNOSIS — M0579 Rheumatoid arthritis with rheumatoid factor of multiple sites without organ or systems involvement: Secondary | ICD-10-CM | POA: Diagnosis not present

## 2020-09-24 DIAGNOSIS — M810 Age-related osteoporosis without current pathological fracture: Secondary | ICD-10-CM | POA: Diagnosis not present

## 2020-09-24 DIAGNOSIS — M2550 Pain in unspecified joint: Secondary | ICD-10-CM | POA: Diagnosis not present

## 2020-10-24 ENCOUNTER — Ambulatory Visit (HOSPITAL_COMMUNITY)
Admission: RE | Admit: 2020-10-24 | Discharge: 2020-10-24 | Disposition: A | Discharge status: Home / Self Care | Payer: Medicare Other | Source: Ambulatory Visit | Attending: Vascular Surgery | Admitting: Vascular Surgery

## 2020-10-24 ENCOUNTER — Other Ambulatory Visit: Payer: Self-pay

## 2020-10-24 ENCOUNTER — Ambulatory Visit (INDEPENDENT_AMBULATORY_CARE_PROVIDER_SITE_OTHER): Payer: Medicare Other | Admitting: Physician Assistant

## 2020-10-24 VITALS — BP 150/70 | HR 71 | Temp 97.8°F | Resp 16 | Ht 67.0 in | Wt 185.0 lb

## 2020-10-24 DIAGNOSIS — I739 Peripheral vascular disease, unspecified: Secondary | ICD-10-CM

## 2020-10-24 NOTE — Progress Notes (Signed)
Office Note     CC:  follow up Requesting Provider:  Fayrene Helper, MD  HPI: Danielle Cruz is a 65 y.o. ([birth year]) female who presents for follow up of peripheral artery disease. She previously underwent revascularization with [facility] angioplasty of her right popliteal and SFA in October of 2021 by Dr. Donzetta Matters. This was for CLE with non healing wounds. She has hx of stroke and is non verbal. Additionally she has hemiparesis of her RLE. She was last seen in January by Dr.Cain and was anticipating a right AKA by Dr. Sharol Given. She subsequently had a right AKA on 04/30/20.  She is here for follow up today with non invasive studies on her LLE. She reports no pain, coldness, numbness, or non healing wounds in left leg. She does have swelling in her left leg. She has adjustable bed that she elevates it in but this does not help much.  She does not ambulate since her AKA. She does transfer and stand on her left leg.  Her right AKA is doing well and has healed. She reports no pain.   The pt is on a statin for cholesterol management. Also takes Zetia The pt is on a daily aspirin.   Other AC:  none The pt is on CCB, BB, diuretic for hypertension.   The pt is not diabetic.   Tobacco hx:  current, 1/4 ppd  Past Medical History:  Diagnosis Date   AKI (acute kidney injury) ([place]) 09/22/2017   Anemia    Arteriosclerotic cardiovascular disease (ASCVD) 07/2003   DES to the LAD and the BMS to the OM1- normal  EF   Arthritis    Atherosclerosis of native arteries of extremities with gangrene, right leg ([facility])    CHF (congestive heart failure) ([place]) 10/02/2019   Colonic polyp 2010   Hemorrhoids; h/o mild hematochezia   CVA (cerebral infarction) 08/2008   sizable left -residual expressive aphasia, right sided weakness; ambulates with difficulty with the right leg brace    Hyperlipidemia    Hypertension 2005   Osteoporosis 03/28/2020   Rheumatoid arthritis(714.0)    Stroke [facility])     Past Surgical History:   Procedure Laterality Date   ABDOMINAL AORTOGRAM W/LOWER EXTREMITY Bilateral 01/28/2020   Procedure: ABDOMINAL AORTOGRAM W/LOWER EXTREMITY;  Surgeon: Waynetta Sandy, MD;  Location: [facility];  Service: Cardiovascular;  Laterality: Bilateral;   ABOVE KNEE LEG AMPUTATION Right 04/30/2020   AMPUTATION Right 04/30/2020   Procedure: RIGHT ABOVE KNEE AMPUTATION;  Surgeon: [name], MD;  Location: [facility];  Service: Orthopedics;  Laterality: Right;   ANKLE SURGERY     Right   CAROTID STENT INSERTION     COLONOSCOPY W/ POLYPECTOMY  11/2008   Dr. Gala Romney. Left-sided diverticula, Pedunculated polyp snared (no adenomatous changes)   COLONOSCOPY WITH PROPOFOL N/A 01/12/2018   Procedure: COLONOSCOPY WITH PROPOFOL;  Surgeon: Daneil Dolin, MD;  [facility];  Service: Endoscopy;  Laterality: N/A;   FEMUR IM NAIL Right 09/23/2017   Procedure: INTRAMEDULLARY (IM) NAIL FEMORAL;  Surgeon: Renette Butters, MD;  Location: [place] [facility];  Service: Orthopedics;  Laterality: Right;   FOOT SURGERY Right 01/31/2020   FRACTURE SURGERY     Hip replacement in May 2019 - fall related    OOPHORECTOMY     PERIPHERAL VASCULAR BALLOON ANGIOPLASTY Right 01/28/2020   Procedure: PERIPHERAL VASCULAR BALLOON ANGIOPLASTY;  Surgeon: Waynetta Sandy, MD;  Location: [facility];  Service: Cardiovascular;  Laterality: Right;  SFA    Social History   Socioeconomic History   Marital status: Married    Spouse name: Not on file   Number of children: 3   Years of education: 11th grade   Highest education level: 11th grade  Occupational History   Occupation: disabilty x 9years    Comment: secondary to arthritis   Tobacco Use   Smoking status: Every Day    Packs/day: 0.25    Years: 15.00    Pack years: 3.75    Types: Cigarettes   Smokeless tobacco: Never  Vaping Use   Vaping Use: Never used  Substance and Sexual Activity   Alcohol use: No    Alcohol/week: 0.0 standard drinks     Comment: quit 6 years ago    Drug use: No   Sexual activity: Not on file  Other Topics Concern   Not on file  Social History Narrative   Pt gave birth to 4 children, 1 deceased at 5 weeks was a premature baby, 3 are living ages range 59 to 32 all healthy    Social Determinants of Health   Financial Resource Strain: Medium Risk   Difficulty of Paying Living Expenses: Somewhat hard  Food Insecurity: No Food Insecurity   Worried About Charity fundraiser in the Last Year: Never true   Ran Out of Food in the Last Year: Never true  Transportation Needs: No Transportation Needs   Lack of Transportation (Medical): No   Lack of Transportation (Non-Medical): No  Physical Activity: Inactive   Days of Exercise per Week: 0 days   Minutes of Exercise per Session: 0 min  Stress: No Stress Concern Present   Feeling of Stress : Only a little  Social Connections: Moderately Integrated   Frequency of Communication with Friends and Family: Never   Frequency of Social Gatherings with Friends and Family: Three times a week   Attends Religious Services: More than 4 times per year   Active Member of Clubs or Organizations: No   Attends Archivist Meetings: Never   Marital Status: Married  Human resources officer Violence: Not At Risk   Fear of Current or Ex-Partner: No   Emotionally Abused: No   Physically Abused: No   Sexually Abused: No    Family History  Problem Relation Age of Onset   Cancer Father        prostate, deceased 6s   Breast cancer Sister        Deceased 20s   Heart attack Mother        deceased 46, during childbirth   Cancer Brother        lymphoma   Colon cancer Maternal Aunt        older than age 20   Liver disease Neg Hx     Current Outpatient Medications  Medication Sig Dispense Refill   acetaminophen (TYLENOL) 325 MG tablet Take 650 mg by mouth every 6 (six) hours as needed for mild pain or moderate pain.     amLODipine (NORVASC) 5 MG tablet TAKE 1 TABLET(5  MG) BY MOUTH DAILY 30 tablet 3   aspirin EC 81 MG tablet Take 81 mg by mouth daily. Swallow whole.     Calcium-Magnesium-Vitamin D (CALCIUM 1200+D3 PO) Take 1 tablet by mouth daily.     Cholecalciferol (VITAMIN D3) 125 MCG (5000 UT) CAPS Take 5,000 Units by mouth daily.     ezetimibe (ZETIA) 10 MG tablet TAKE 1 TABLET(10 MG) BY MOUTH DAILY (Patient taking differently:  Take 10 mg by mouth daily.) 90 tablet 3   fluticasone (FLONASE) 50 MCG/ACT nasal spray Place 2 sprays into both nostrils daily. 16 g 6   HYDROcodone-acetaminophen (NORCO/VICODIN) 5-325 MG tablet Take 1-2 tablets by mouth every 4 (four) hours as needed for moderate pain. 30 tablet 0   hydroxychloroquine (PLAQUENIL) 200 MG tablet Take 200 mg by mouth See admin instructions. Take 200 mg twice daily every 3rd day     metoprolol succinate (TOPROL-XL) 25 MG 24 hr tablet TAKE 1 TABLET(25 MG) BY MOUTH DAILY 30 tablet 11   montelukast (SINGULAIR) 10 MG tablet TAKE 1 TABLET(10 MG) BY MOUTH AT BEDTIME 30 tablet 4   Multiple Vitamin (MULTIVITAMIN WITH MINERALS) TABS tablet Take 1 tablet by mouth daily.     oxybutynin (DITROPAN) 5 MG tablet TAKE 1/2 TABLET(2.5 MG) BY MOUTH TWICE DAILY 90 tablet 3   pantoprazole (PROTONIX) 40 MG tablet TAKE 1 TABLET(40 MG) BY MOUTH DAILY 30 tablet 5   potassium chloride SA (KLOR-CON) 20 MEQ tablet TAKE 1 TABLET BY MOUTH EVERY DAY 90 tablet 1   rosuvastatin (CRESTOR) 20 MG tablet TAKE 1 TABLET BY MOUTH EVERY MONDAY, WEDNESDAY, FRIDAY, AND SATURDAY 48 tablet 6   UNABLE TO FIND Under pads use as needed  Pullups use as needed   Length of need- 99 months DX urinary incontinence 100 each 11   UNABLE TO FIND Incontinence briefs and pads Dx incontinence 100 each 11   torsemide (DEMADEX) 20 MG tablet Take 0.5 tablets (10 mg total) by mouth as needed. 180 tablet 3   No current facility-administered medications for this visit.   Facility-Administered Medications Ordered in Other Visits  Medication Dose Route  Frequency Provider Last Rate Last Admin   0.9 %  sodium chloride infusion (Manually program via Guardrails IV Fluids)   Intravenous Once Persons, Bevely Palmer, PA        Allergies  Allergen Reactions   Ace Inhibitors Cough    New daily cough since starting ACE     REVIEW OF SYSTEMS:  [X]  denotes positive finding, [ ]  denotes negative finding Cardiac  Comments:  Chest pain or chest pressure:    Shortness of breath upon exertion:    Short of breath when lying flat:    Irregular heart rhythm:        Vascular    Pain in calf, thigh, or hip brought on by ambulation:    Pain in feet at night that wakes you up from your sleep:     Blood clot in your veins:    Leg swelling:         Pulmonary    Oxygen at home:    Productive cough:     Wheezing:         Neurologic    Sudden weakness in arms or legs:     Sudden numbness in arms or legs:     Sudden onset of difficulty speaking or slurred speech:    Temporary loss of vision in one eye:     Problems with dizziness:         Gastrointestinal    Blood in stool:     Vomited blood:         Genitourinary    Burning when urinating:     Blood in urine:        Psychiatric    Major depression:         Hematologic    Bleeding problems:    Problems with blood  clotting too easily:        Skin    Rashes or ulcers:        Constitutional    Fever or chills:      PHYSICAL EXAMINATION:  Vitals:   10/24/20 1037  Resp: 16  Weight: 185 lb (83.9 kg)  Height: 5\' 7"  (1.702 m)    General:  WDWN in NAD; vital signs documented above Gait: Not observed, in wheel chair HENT: WNL, normocephalic Pulmonary: normal non-labored breathing  Cardiac: regular HR Vascular Exam/Pulses:  Right Left  Radial 2+ (normal) 2+ (normal)  Femoral 2+ (normal) 2+ (normal)  Popliteal AKA Not palpable  DP AKA Not palpable, monophasic doppler signal  PT AKA Not palpable, monophasic doppler signal   Extremities: without ischemic changes, without Gangrene ,  without cellulitis; without open wounds; left foot is warm and well perfused. Edema of left lower extremty Musculoskeletal: no muscle wasting or atrophy  Neurologic: A&O X 3;  No focal weakness or paresthesias are detected Psychiatric:  The pt has Normal affect.   Non-Invasive Vascular Imaging:   Patient was not able to tolerate ABIs. Toe pressure of 86 mmHg was only value obtainable   ASSESSMENT/PLAN:: 65 y.o. female here for follow up for peripheral artery disease. She is now s/p right AKA by Dr. Sharol Given which has healed nicely. She has no rest pain or tissue loss on left leg. Claudication cannot be evaluated as she does not ambulate. She is having swelling in the left leg but this is likely some underlying venous disease as well as due to non ambulatory status. Discussed proper elevation and also discussed wearing a light compression stocking. - On angiogram in October of 2021 she was noted to have calcified disease of the CFA and proximal SFA but no flow limiting stenosis of the LLE and 3 vessel runoff into the left foot - She was not able to tolerate ABIs today although the RVT was able to get a toe pressure of 23mm Hg - discussed importance of keeping left foot protected and frequent foot checks - She will continue Aspirin and statin - Will have her return for follow up in 6 months but advised them to call for earlier follow up should they have any concerns  Karoline Caldwell, PA-C [facility] [phone]  On call MD:   Stanford Breed

## 2020-11-17 ENCOUNTER — Ambulatory Visit: Payer: Medicare Other | Admitting: Family Medicine

## 2020-11-18 ENCOUNTER — Other Ambulatory Visit: Payer: Self-pay | Admitting: Family Medicine

## 2020-11-21 ENCOUNTER — Ambulatory Visit: Payer: Medicare Other | Admitting: Family Medicine

## 2020-12-08 ENCOUNTER — Other Ambulatory Visit: Payer: Self-pay | Admitting: Family Medicine

## 2020-12-11 ENCOUNTER — Other Ambulatory Visit: Payer: Self-pay

## 2020-12-11 ENCOUNTER — Telehealth: Payer: Self-pay

## 2020-12-11 MED ORDER — PANTOPRAZOLE SODIUM 40 MG PO TBEC: DELAYED_RELEASE_TABLET | ORAL | 5 refills | Status: DC

## 2020-12-11 NOTE — Telephone Encounter (Signed)
Med refilled.

## 2020-12-11 NOTE — Telephone Encounter (Signed)
Patient's sister calling patient is needing a refill on pantoprazole 40 mg

## 2020-12-18 ENCOUNTER — Other Ambulatory Visit: Payer: Self-pay | Admitting: Family Medicine

## 2020-12-18 DIAGNOSIS — I10 Essential (primary) hypertension: Secondary | ICD-10-CM

## 2020-12-18 DIAGNOSIS — E785 Hyperlipidemia, unspecified: Secondary | ICD-10-CM

## 2020-12-18 DIAGNOSIS — Z1231 Encounter for screening mammogram for malignant neoplasm of breast: Secondary | ICD-10-CM

## 2020-12-31 DIAGNOSIS — Z79891 Long term (current) use of opiate analgesic: Secondary | ICD-10-CM | POA: Diagnosis not present

## 2020-12-31 DIAGNOSIS — Z79899 Other long term (current) drug therapy: Secondary | ICD-10-CM | POA: Diagnosis not present

## 2020-12-31 DIAGNOSIS — M0579 Rheumatoid arthritis with rheumatoid factor of multiple sites without organ or systems involvement: Secondary | ICD-10-CM | POA: Diagnosis not present

## 2020-12-31 DIAGNOSIS — M810 Age-related osteoporosis without current pathological fracture: Secondary | ICD-10-CM | POA: Diagnosis not present

## 2020-12-31 DIAGNOSIS — M2550 Pain in unspecified joint: Secondary | ICD-10-CM | POA: Diagnosis not present

## 2020-12-31 DIAGNOSIS — K760 Fatty (change of) liver, not elsewhere classified: Secondary | ICD-10-CM | POA: Diagnosis not present

## 2020-12-31 DIAGNOSIS — G8929 Other chronic pain: Secondary | ICD-10-CM | POA: Diagnosis not present

## 2020-12-31 DIAGNOSIS — R748 Abnormal levels of other serum enzymes: Secondary | ICD-10-CM | POA: Diagnosis not present

## 2021-01-05 ENCOUNTER — Other Ambulatory Visit: Payer: Self-pay | Admitting: Family Medicine

## 2021-02-18 ENCOUNTER — Ambulatory Visit (INDEPENDENT_AMBULATORY_CARE_PROVIDER_SITE_OTHER): Payer: Medicare Other | Admitting: Family Medicine

## 2021-02-18 ENCOUNTER — Other Ambulatory Visit: Payer: Self-pay

## 2021-02-18 ENCOUNTER — Encounter: Payer: Self-pay | Admitting: Family Medicine

## 2021-02-18 ENCOUNTER — Other Ambulatory Visit (HOSPITAL_COMMUNITY)
Admission: RE | Admit: 2021-02-18 | Discharge: 2021-02-18 | Disposition: A | Discharge status: Home / Self Care | Payer: Medicare Other | Source: Ambulatory Visit | Attending: Family Medicine | Admitting: Family Medicine

## 2021-02-18 VITALS — BP 130/70 | HR 71 | Resp 17

## 2021-02-18 DIAGNOSIS — I5032 Chronic diastolic (congestive) heart failure: Secondary | ICD-10-CM

## 2021-02-18 DIAGNOSIS — I2510 Atherosclerotic heart disease of native coronary artery without angina pectoris: Secondary | ICD-10-CM | POA: Diagnosis not present

## 2021-02-18 DIAGNOSIS — E782 Mixed hyperlipidemia: Secondary | ICD-10-CM

## 2021-02-18 DIAGNOSIS — E559 Vitamin D deficiency, unspecified: Secondary | ICD-10-CM | POA: Diagnosis not present

## 2021-02-18 DIAGNOSIS — Z01419 Encounter for gynecological examination (general) (routine) without abnormal findings: Secondary | ICD-10-CM | POA: Insufficient documentation

## 2021-02-18 DIAGNOSIS — R87810 Cervical high risk human papillomavirus (HPV) DNA test positive: Secondary | ICD-10-CM | POA: Diagnosis not present

## 2021-02-18 DIAGNOSIS — Z23 Encounter for immunization: Secondary | ICD-10-CM | POA: Diagnosis not present

## 2021-02-18 DIAGNOSIS — R87619 Unspecified abnormal cytological findings in specimens from cervix uteri: Secondary | ICD-10-CM | POA: Diagnosis not present

## 2021-02-18 DIAGNOSIS — I10 Essential (primary) hypertension: Secondary | ICD-10-CM

## 2021-02-18 DIAGNOSIS — F17210 Nicotine dependence, cigarettes, uncomplicated: Secondary | ICD-10-CM | POA: Diagnosis not present

## 2021-02-18 DIAGNOSIS — Z1151 Encounter for screening for human papillomavirus (HPV): Secondary | ICD-10-CM | POA: Diagnosis not present

## 2021-02-18 DIAGNOSIS — Z720 Tobacco use: Secondary | ICD-10-CM | POA: Diagnosis not present

## 2021-02-18 DIAGNOSIS — R7301 Impaired fasting glucose: Secondary | ICD-10-CM

## 2021-02-18 DIAGNOSIS — Z124 Encounter for screening for malignant neoplasm of cervix: Secondary | ICD-10-CM | POA: Diagnosis not present

## 2021-02-18 NOTE — Patient Instructions (Addendum)
F/U in 5 months, call if you need  me sooner  Flu vaccine today  Pap sent  Please continue to work on quitting smoking  CBC, lipid, cmp and eGFR , HBA1C, TSH and vit D today  Careful not to fall  Thanks for choosing [facility], we consider it a privelige to serve you.

## 2021-02-19 LAB — CMP14+EGFR
ALT: 34 IU/L — ABNORMAL HIGH (ref 0–32)
AST: 40 IU/L (ref 0–40)
Albumin/Globulin Ratio: 1 — ABNORMAL LOW (ref 1.2–2.2)
Albumin: 3.5 g/dL — ABNORMAL LOW (ref 3.8–4.8)
Alkaline Phosphatase: 83 IU/L (ref 44–121)
BUN/Creatinine Ratio: 9 — ABNORMAL LOW (ref 12–28)
BUN: 8 mg/dL (ref 8–27)
Bilirubin Total: 0.2 mg/dL (ref 0.0–1.2)
CO2: 22 mmol/L (ref 20–29)
Calcium: 9 mg/dL (ref 8.7–10.3)
Chloride: 104 mmol/L (ref 96–106)
Creatinine, Ser: 0.93 mg/dL (ref 0.57–1.00)
Globulin, Total: 3.6 g/dL (ref 1.5–4.5)
Glucose: 92 mg/dL (ref 70–99)
Potassium: 3.8 mmol/L (ref 3.5–5.2)
Sodium: 139 mmol/L (ref 134–144)
Total Protein: 7.1 g/dL (ref 6.0–8.5)
eGFR: 68 mL/min/{1.73_m2} (ref >59)

## 2021-02-19 LAB — VITAMIN D 25 HYDROXY (VIT D DEFICIENCY, FRACTURES): Vit D, 25-Hydroxy: 45.2 ng/mL (ref 30.0–100.0)

## 2021-02-19 LAB — HEMOGLOBIN A1C
Est. average glucose Bld gHb Est-mCnc: 128 mg/dL
Hgb A1c MFr Bld: 6.1 % — ABNORMAL HIGH (ref 4.8–5.6)

## 2021-02-19 LAB — LIPID PANEL
Chol/HDL Ratio: 4 ratio (ref 0.0–4.4)
Cholesterol, Total: 148 mg/dL (ref 100–199)
HDL: 37 mg/dL — ABNORMAL LOW (ref >39)
LDL Chol Calc (NIH): 85 mg/dL (ref 0–99)
Triglycerides: 147 mg/dL (ref 0–149)
VLDL Cholesterol Cal: 26 mg/dL (ref 5–40)

## 2021-02-19 LAB — CBC
Hematocrit: 39.6 % (ref 34.0–46.6)
Hemoglobin: 13.3 g/dL (ref 11.1–15.9)
MCH: 30.9 pg (ref 26.6–33.0)
MCHC: 33.6 g/dL (ref 31.5–35.7)
MCV: 92 fL (ref 79–97)
Platelets: 203 10*3/uL (ref 150–450)
RBC: 4.31 x10E6/uL (ref 3.77–5.28)
RDW: 13.1 % (ref 11.7–15.4)
WBC: 9.1 10*3/uL (ref 3.4–10.8)

## 2021-02-19 LAB — TSH: TSH: 3.16 u[IU]/mL (ref 0.450–4.500)

## 2021-02-22 ENCOUNTER — Encounter: Payer: Self-pay | Admitting: Family Medicine

## 2021-02-22 DIAGNOSIS — R87619 Unspecified abnormal cytological findings in specimens from cervix uteri: Secondary | ICD-10-CM | POA: Insufficient documentation

## 2021-02-22 NOTE — Assessment & Plan Note (Signed)
Hyperlipidemia:Low fat diet discussed and encouraged.   Lipid Panel  Lab Results  Component Value Date   CHOL 148 02/18/2021   HDL 37 (L) 02/18/2021   LDLCALC 85 02/18/2021   TRIG 147 02/18/2021   CHOLHDL 4.0 02/18/2021  Controlled, no change in medication

## 2021-02-22 NOTE — Assessment & Plan Note (Signed)
Repeat pap sent

## 2021-02-22 NOTE — Progress Notes (Signed)
    Danielle Cruz     MRN: [id number]      DOB: [birth year]  HPI: Patient is in for pelvic with repeat pap due to abn pap in the past and routine f/u No concerns Working on quitting smoking.  Immunization is reviewed , and  updated    PE: BP 130/70   Pulse 71   Resp 17   SpO2 100%   Pleasant  female, alert and oriented x 3, in no cardio-pulmonary distress. Afebrile. HEENT No facial trauma or asymetry. Sinuses non tender.  Extra occullar muscles intact.. External ears normal, . Neck: supple, no adenopathy,JVD or thyromegaly.No bruits.  Chest: Clear to ascultation bilaterally.No crackles or wheezes. Non tender to palpation  Breast: No asymetry,no masses or lumps. No tenderness. No nipple discharge or inversion. No axillary or supraclavicular adenopathy  Cardiovascular system; Heart sounds normal,  S1 and  S2 ,no S3.  No murmur, or thrill. Apical beat not displaced Peripheral pulses normal.  Abdomen: Soft, non tender, no organomegaly or masses. No bruits. Bowel sounds normal. No guarding, tenderness or rebound.   GU: External genitalia normal female genitalia , normal female distribution of hair. No lesions. Urethral meatus normal in size, no  Prolapse, no lesions visibly  Present. Bladder non tender. Vagina pink and moist , with no visible lesions , discharge present . Adequate pelvic support no  cystocele or rectocele noted Cervix pink and appears healthy, no lesions or ulcerations noted, no discharge noted from os Uterus normal size, no adnexal masses, no cervical motion or adnexal tenderness.   Musculoskeletal exam: Decreased  ROM of spine, hips , shoulders and knees. No deformity ,swelling or crepitus noted. Right AKA   Neurologic: Cranial nerves 2 to 12 intact.right hemiparesis Unilateral aka amputee (right).  Skin: Intact, no ulceration, erythema , scaling or rash noted. Pigmentation normal throughout  Psych; Normal mood and affect.  Assessment &  Plan:  Abnormal Pap smear of cervix Repeat pap sent  Hypertension Controlled, no change in medication   Tobacco abuse Asked:confirms currently smokes cigarettes Assess: Unwilling to set a quit date, but is cutting back Advise: needs to QUIT to reduce risk of cancer, cardio and cerebrovascular disease Assist: counseled for 5 minutes and literature provided Arrange: follow up in 2 to 4 months   Chronic diastolic CHF (congestive heart failure) ([place]) Stable and controlled, asymptoatic with normal exam  Hyperlipidemia Hyperlipidemia:Low fat diet discussed and encouraged.   Lipid Panel  Lab Results  Component Value Date   CHOL 148 02/18/2021   HDL 37 (L) 02/18/2021   LDLCALC 85 02/18/2021   TRIG 147 02/18/2021   CHOLHDL 4.0 02/18/2021  Controlled, no change in medication

## 2021-02-22 NOTE — Assessment & Plan Note (Signed)
Controlled, no change in medication  

## 2021-02-22 NOTE — Assessment & Plan Note (Signed)
Stable and controlled, asymptoatic with normal exam

## 2021-02-22 NOTE — Assessment & Plan Note (Signed)
Asked:confirms currently smokes cigarettes °Assess: Unwilling to set a quit date, but is cutting back °Advise: needs to QUIT to reduce risk of cancer, cardio and cerebrovascular disease °Assist: counseled for 5 minutes and literature provided °Arrange: follow up in 2 to 4 months ° °

## 2021-02-27 ENCOUNTER — Other Ambulatory Visit: Payer: Self-pay | Admitting: Family Medicine

## 2021-02-27 DIAGNOSIS — R87612 Low grade squamous intraepithelial lesion on cytologic smear of cervix (LGSIL): Secondary | ICD-10-CM

## 2021-02-27 DIAGNOSIS — R87618 Other abnormal cytological findings on specimens from cervix uteri: Secondary | ICD-10-CM

## 2021-02-27 LAB — CYTOLOGY - PAP
Adequacy: ABSENT
Comment: NEGATIVE
Comment: NEGATIVE
HPV 16: NEGATIVE
HPV 18 / 45: POSITIVE — AB
High risk HPV: POSITIVE — AB

## 2021-03-16 ENCOUNTER — Other Ambulatory Visit: Payer: Self-pay

## 2021-03-16 ENCOUNTER — Ambulatory Visit (INDEPENDENT_AMBULATORY_CARE_PROVIDER_SITE_OTHER): Payer: Medicare Other

## 2021-03-16 DIAGNOSIS — Z0000 Encounter for general adult medical examination without abnormal findings: Secondary | ICD-10-CM | POA: Diagnosis not present

## 2021-03-16 NOTE — Progress Notes (Signed)
I connected with  Danielle Cruz on 03/16/21 by a audio enabled telemedicine application and verified that I am speaking with the correct person using two identifiers.  Patient Location: Home- speaking with daughter since pt is non verbal  Provider Location: Office/Clinic  I discussed the limitations of evaluation and management by telemedicine. The patient expressed understanding and agreed to proceed.    Subjective:   Danielle Cruz is a 65 y.o. female who presents for Medicare Annual (Subsequent) preventive examination.  Review of Systems     Cardiac Risk Factors include: smoking/ tobacco exposure;dyslipidemia;hypertension;sedentary lifestyle     Objective:    There were no vitals filed for this visit. There is no height or weight on file to calculate BMI.  Advanced Directives 03/16/2021 04/30/2020 04/22/2020 04/01/2020 03/28/2020 02/14/2020 01/28/2020  Does Patient Have a Medical Advance Directive? No Yes No No;Yes No No Yes  Type of Advance Directive - Healthcare Power of [place]  Does patient want to make changes to medical advance directive? - - - - - No - Guardian declined No - Patient declined  Copy of [place] in Chart? - No - copy requested - - - - -  Would patient like information on creating a medical advance directive? Yes (ED - Information included in AVS) - - - - No - Guardian declined -    Current Medications (verified) Outpatient Encounter Medications as of 03/16/2021  Medication Sig   acetaminophen (TYLENOL) 325 MG tablet Take 650 mg by mouth every 6 (six) hours as needed for mild pain or moderate pain.   amLODipine (NORVASC) 5 MG tablet TAKE 1 TABLET(5 MG) BY MOUTH DAILY   aspirin EC 81 MG tablet Take 81 mg by mouth daily. Swallow whole.   Calcium-Magnesium-Vitamin D (CALCIUM 1200+D3 PO) Take 1 tablet by mouth daily.   Cholecalciferol (VITAMIN D3) 125 MCG (5000 UT) CAPS Take 5,000 Units by mouth daily.    ezetimibe (ZETIA) 10 MG tablet TAKE 1 TABLET(10 MG) BY MOUTH DAILY   fluticasone (FLONASE) 50 MCG/ACT nasal spray SHAKE LIQUID AND USE 2 SPRAYS IN EACH NOSTRIL DAILY   HYDROcodone-acetaminophen (NORCO/VICODIN) 5-325 MG tablet Take 1-2 tablets by mouth every 4 (four) hours as needed for moderate pain.   hydroxychloroquine (PLAQUENIL) 200 MG tablet Take 200 mg by mouth See admin instructions. Take 200 mg twice daily every 3rd day   metoprolol succinate (TOPROL-XL) 25 MG 24 hr tablet TAKE 1 TABLET(25 MG) BY MOUTH DAILY   montelukast (SINGULAIR) 10 MG tablet TAKE 1 TABLET(10 MG) BY MOUTH AT BEDTIME   Multiple Vitamin (MULTIVITAMIN WITH MINERALS) TABS tablet Take 1 tablet by mouth daily.   oxybutynin (DITROPAN) 5 MG tablet TAKE 1/2 TABLET(2.5 MG) BY MOUTH TWICE DAILY   pantoprazole (PROTONIX) 40 MG tablet TAKE 1 TABLET(40 MG) BY MOUTH DAILY   potassium chloride SA (KLOR-CON) 20 MEQ tablet TAKE 1 TABLET BY MOUTH EVERY DAY   rosuvastatin (CRESTOR) 20 MG tablet TAKE 1 TABLET BY MOUTH EVERY MONDAY, WEDNESDAY, FRIDAY, AND SATURDAY   torsemide (DEMADEX) 20 MG tablet Take 0.5 tablets (10 mg total) by mouth as needed.   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   DES to the LAD and the BMS to the OM1- normal  EF   Arthritis    Atherosclerosis of native arteries of extremities with gangrene, right leg (HCC)    CHF (congestive heart failure) ([place]) 10/02/2019   Colonic polyp 2010   Hemorrhoids; h/o mild  hematochezia   CVA (cerebral infarction) 08/2008   sizable left -residual expressive aphasia, right sided weakness; ambulates with difficulty with the right leg brace    Hyperlipidemia    Hypertension 2005   Osteoporosis 03/28/2020   Rheumatoid arthritis(714.0)    Stroke [facility])    Past Surgical History:  Procedure Laterality Date   ABDOMINAL AORTOGRAM W/LOWER EXTREMITY Bilateral 01/28/2020   Procedure: ABDOMINAL AORTOGRAM W/LOWER EXTREMITY;  Surgeon: Waynetta Sandy, MD;  Location: [place] [facility];  Service: Cardiovascular;  Laterality: Bilateral;   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 Gala Romney. Left-sided diverticula, Pedunculated polyp snared (no adenomatous changes)   COLONOSCOPY WITH PROPOFOL N/A 01/12/2018   Procedure: COLONOSCOPY WITH PROPOFOL;  Surgeon: Daneil Dolin, MD;  [facility];  Service: Endoscopy;  Laterality: N/A;   FEMUR IM NAIL Right 09/23/2017   Procedure: INTRAMEDULLARY (IM) NAIL FEMORAL;  Surgeon: Renette Butters, MD;  Location: [place];  Service: Orthopedics;  Laterality: Right;   FOOT SURGERY Right 01/31/2020   FRACTURE SURGERY     Hip replacement in May 2019 - fall related    OOPHORECTOMY     PERIPHERAL VASCULAR BALLOON ANGIOPLASTY Right 01/28/2020   Procedure: PERIPHERAL VASCULAR BALLOON ANGIOPLASTY;  Surgeon: Waynetta Sandy, MD;  Location: [place] [facility];  Service: Cardiovascular;  Laterality: Right;  SFA   Family History  Problem Relation Age of Onset   Cancer Father        prostate, deceased 46s   Breast cancer Sister        Deceased 70s   Heart attack Mother        deceased 69, during childbirth   Cancer Brother        lymphoma   Colon cancer Maternal  Aunt        older than age 17   Liver disease Neg Hx    Social History   Socioeconomic History   Marital status: Married    Spouse name: Not on file   Number of children: 3   Years of education: 11th grade   Highest education level: 11th grade  Occupational History   Occupation: disabilty x 9years    Comment: secondary to arthritis   Tobacco Use   Smoking status: Every Day    Packs/day: 0.25    Years: 15.00    Pack years: 3.75    Types: Cigarettes   Smokeless tobacco: Never  Vaping Use   Vaping Use: Never used  Substance and Sexual Activity   Alcohol use: No    Alcohol/week: 0.0 standard drinks    Comment: quit 6 years ago    Drug use: No  Sexual activity: Not on file  Other Topics Concern   Not on file  Social History Narrative   Pt gave birth to 4 children, 1 deceased at 34 weeks was a premature baby, 3 are living ages range 56 to 67 all healthy    Social Determinants of Health   Financial Resource Strain: Low Risk    Difficulty of Paying Living Expenses: Not hard at all  Food Insecurity: No Food Insecurity   Worried About Charity fundraiser in the Last Year: Never true   Arboriculturist in the Last Year: Never true  Transportation Needs: No Transportation Needs   Lack of Transportation (Medical): No   Lack of Transportation (Non-Medical): No  Physical Activity: Inactive   Days of Exercise per Week: 0 days   Minutes of Exercise per Session: 0 min  Stress: Not on file  Social Connections: Moderately Isolated   Frequency of Communication with Friends and Family: Once a week   Frequency of Social Gatherings with Friends and Family: Once a week   Attends Religious Services: 1 to 4 times per year   Active Member of [employer] or Organizations: No   Attends Music therapist: Never   Marital Status: Married    Tobacco Counseling Ready to quit: Not Answered Counseling given: Not Answered   Clinical Intake:  Pre-visit preparation completed:  Yes  Pain : No/denies pain     Nutritional Status: BMI of 19-24  Normal Diabetes: No  How often do you need to have someone help you when you read instructions, pamphlets, or other written materials from your doctor or pharmacy?: 4 - Often  Diabetic?no  Interpreter Needed?: No      Activities of Daily Living In your present state of health, do you have any difficulty performing the following activities: 03/16/2021 04/30/2020  Hearing? N -  Vision? N -  Difficulty concentrating or making decisions? N -  Walking or climbing stairs? Y -  Dressing or bathing? Y -  Doing errands, shopping? Y N  Preparing Food and eating ? Y -  Using the Toilet? Y -  In the past six months, have you accidently leaked urine? Y -  Do you have problems with loss of bowel control? N -  Managing your Medications? Y -  Managing your Finances? Y -  Housekeeping or managing your Housekeeping? Y -  Some recent data might be hidden    Patient Care Team: Fayrene Helper, MD as PCP - General Branch, Alphonse Guild, MD as PCP - Cardiology (Cardiology) Unice Bailey, MD (Internal Medicine) Gala Romney Cristopher Estimable, MD as Consulting Physician (Gastroenterology) Harl Bowie Alphonse Guild, MD as Consulting Physician (Cardiology)  Indicate any recent Medical Services you may have received from other than [facility] providers in the past year (date may be approximate).     Assessment:   This is a routine wellness examination for Danielle Cruz.  Hearing/Vision screen No results found.  Dietary issues and exercise activities discussed: Exercise limited by: orthopedic condition(s);Other - see comments (stroke and amputee)   Goals Addressed             This Visit's Progress    LIFESTYLE - DECREASE FALLS RISK   On track    Quit Smoking       Cut back a couple cigarettes at spaced out intervals until you eventually are able to quit       Depression Screen [facility] 2/9 Scores 03/16/2021 06/03/2020 05/21/2020 05/14/2020 03/31/2020  02/14/2020 02/14/2020  PHQ - 2 Score 0 0 0 0 0 0 0  PHQ- 9 Score - - - - - - -    Fall Risk Fall Risk  06/03/2020 05/21/2020 05/14/2020 03/31/2020 03/28/2020  Falls in the past year? 0 0 0 0 0  Number falls in past yr: 0 0 0 0 0  Injury with Fall? 0 0 0 0 0  Risk for fall due to : No Fall Risks No Fall Risks No Fall Risks No Fall Risks No Fall Risks  Follow up Falls evaluation completed Falls evaluation completed Falls evaluation completed Falls evaluation completed Falls evaluation completed    [place]:  Any stairs in or around the home? no If so, are there any without handrails? No  Home free of loose throw rugs in walkways, pet beds, electrical cords, etc? Yes  Adequate lighting in your home to reduce risk of falls? Yes   ASSISTIVE DEVICES UTILIZED TO PREVENT FALLS:  Life alert? Yes  Use of a cane, walker or w/c? Yes  Grab bars in the bathroom? No  Shower chair or bench in shower? Yes  Elevated toilet seat or a handicapped toilet? Yes    Cognitive Function:     6CIT Screen 03/16/2021 03/21/2017  What Year? (No Data) 0 points  What month? - 0 points  What time? - 0 points  Count back from 20 - 0 points  Months in reverse - 0 points  Repeat phrase - 0 points  Total Score - 0    Immunizations Immunization History  Administered Date(s) Administered   Fluad Quad(high Dose 65+) 02/18/2021   Influenza Whole 01/01/2010, 03/09/2010, 01/11/2011   Influenza,inj,Quad PF,6+ Mos 02/09/2013, 05/09/2014, 05/07/2016, 01/31/2017, 01/25/2018, 01/22/2019, 12/20/2019   Janssen (J&J) SARS-COV-2 Vaccination 07/02/2019   Pneumococcal Conjugate-13 11/28/2013   Pneumococcal Polysaccharide-23 01/22/2019   Tdap 01/11/2011    TDAP status: Due, Education has been provided regarding the importance of this vaccine. Advised may receive this vaccine at local pharmacy or Health Dept. Aware to provide a copy of the vaccination record if obtained from local pharmacy  or Health Dept. Verbalized acceptance and understanding.  Flu Vaccine status: Up to date  Pneumococcal vaccine status: Up to date  Covid-19 vaccine status: Completed vaccines  Qualifies for Shingles Vaccine? Yes   Zostavax completed No   Shingrix Completed?: No.    Education has been provided regarding the importance of this vaccine. Patient has been advised to call insurance company to determine out of pocket expense if they have not yet received this vaccine. Advised may also receive vaccine at local pharmacy or Health Dept. Verbalized acceptance and understanding.  Screening Tests Health Maintenance  Topic Date Due   Zoster Vaccines- Shingrix (1 of 2) Never done   MAMMOGRAM  02/05/2017   COVID-19 Vaccine (2 - Janssen risk series) 07/30/2019   DEXA SCAN  Never done   TETANUS/TDAP  01/10/2021   Pneumonia Vaccine 16+ Years old (3 - PPSV23 if available, else PCV20) 01/22/2024   PAP SMEAR-Modifier  02/19/2024   COLONOSCOPY (Pts 45-40yrs Insurance coverage will need to be confirmed)  01/13/2028   INFLUENZA VACCINE  Completed   Hepatitis C Screening  Completed   HIV Screening  Completed   HPV VACCINES  Aged Out    Health Maintenance  Health Maintenance Due  Topic Date Due   Zoster Vaccines- Shingrix (1 of 2) Never done   MAMMOGRAM  02/05/2017   COVID-19 Vaccine (2 - Janssen risk series) 07/30/2019  DEXA SCAN  Never done   TETANUS/TDAP  01/10/2021    Colorectal cancer screening: Type of screening: Colonoscopy. Completed yes. Repeat every 10 years  Mammogram status: Ordered yes. Pt provided with contact info and advised to call to schedule appt. Pt unable to stand due to amputation so could not be completed   Bone Density status: Ordered yes. Pt provided with contact info and advised to call to schedule appt. Pt unable to stand due to amputation  Lung Cancer Screening: (Low Dose CT Chest recommended if Age 18-80 years, 30 pack-year currently smoking OR have quit w/in  15years.) does qualify.   Lung Cancer Screening Referral: yes Additional Screening:  Hepatitis C Screening: does qualify; Completed yes  Vision Screening: Recommended annual ophthalmology exams for early detection of glaucoma and other disorders of the eye. Is the patient up to date with their annual eye exam?  Yes  Who is the provider or what is the name of the office in which the patient attends annual eye exams? [place]- unsure of name but daughter states no issues with her vision  If pt is not established with a provider, would they like to be referred to a provider to establish care? No .   Dental Screening: Recommended annual dental exams for proper oral hygiene  Community Resource Referral / Chronic Care Management: CRR required this visit?  No   CCM required this visit?  No      Plan:     I have personally reviewed and noted the following in the patient's chart:   Medical and social history Use of alcohol, tobacco or illicit drugs  Current medications and supplements including opioid prescriptions.  Functional ability and status Nutritional status Physical activity Advanced directives List of other physicians Hospitalizations, surgeries, and ER visits in previous 12 months Vitals Screenings to include cognitive, depression, and falls Referrals and appointments  In addition, I have reviewed and discussed with patient certain preventive protocols, quality metrics, and best practice recommendations. A written personalized care plan for preventive services as well as general preventive health recommendations were provided to patient.     Kate Sable, LPN, LPN   [id number]   Nurse Notes:  Ms. Bui , Thank you for taking time to come for your Medicare Wellness Visit. I appreciate your ongoing commitment to your health goals. Please review the following plan we discussed and let me know if I can assist you in the future.   These are the goals we discussed:   Goals      LIFESTYLE - DECREASE FALLS RISK     Quit Smoking     Cut back a couple cigarettes at spaced out intervals until you eventually are able to quit        This is a list of the screening recommended for you and due dates:  Health Maintenance  Topic Date Due   Zoster (Shingles) Vaccine (1 of 2) Never done   Mammogram  02/05/2017   COVID-19 Vaccine (2 - Janssen risk series) 07/30/2019   DEXA scan (bone density measurement)  Never done   Tetanus Vaccine  01/10/2021   Pneumonia Vaccine (3 - PPSV23 if available, else PCV20) 01/22/2024   Pap Smear  02/19/2024   Colon Cancer Screening  01/13/2028   Flu Shot  Completed   Hepatitis C Screening: USPSTF Recommendation to screen - Ages 18-79 yo.  Completed   HIV Screening  Completed   HPV Vaccine  Aged Out

## 2021-03-16 NOTE — Patient Instructions (Addendum)
Danielle Cruz , Thank you for taking time to come for your Medicare Wellness Visit. I appreciate your ongoing commitment to your health goals. Please review the following plan we discussed and let me know if I can assist you in the future.   Will check on options for DEXA and mammogram with her being wheelchair bound and unable to stand.   Can obtain shingrix vaccine at local pharmacy- It is a 2 part shot that helps prevent shingles.    These are the goals we discussed:  Goals      LIFESTYLE - DECREASE FALLS RISK     Quit Smoking     Cut back a couple cigarettes at spaced out intervals until you eventually are able to quit        This is a list of the screening recommended for you and due dates:  Health Maintenance  Topic Date Due   Zoster (Shingles) Vaccine (1 of 2) Never done   Mammogram  02/05/2017   COVID-19 Vaccine (2 - Janssen risk series) 07/30/2019   DEXA scan (bone density measurement)  Never done   Tetanus Vaccine  01/10/2021   Pneumonia Vaccine (3 - PPSV23 if available, else PCV20) 01/22/2024   Pap Smear  02/19/2024   Colon Cancer Screening  01/13/2028   Flu Shot  Completed   Hepatitis C Screening: USPSTF Recommendation to screen - Ages 18-79 yo.  Completed   HIV Screening  Completed   HPV Vaccine  Aged Out         Health Maintenance, Female Adopting a healthy lifestyle and getting preventive care are important in promoting health and wellness. Ask your health care provider about: The right schedule for you to have regular tests and exams. Things you can do on your own to prevent diseases and keep yourself healthy. What should I know about diet, weight, and exercise? Eat a healthy diet  Eat a diet that includes plenty of vegetables, fruits, low-fat dairy products, and lean protein. Do not eat a lot of foods that are high in solid fats, added sugars, or sodium. Maintain a healthy weight Body mass index (BMI) is used to identify weight problems. It estimates  body fat based on height and weight. Your health care provider can help determine your BMI and help you achieve or maintain a healthy weight. Get regular exercise Get regular exercise. This is one of the most important things you can do for your health. Most adults should: Exercise for at least 150 minutes each week. The exercise should increase your heart rate and make you sweat (moderate-intensity exercise). Do strengthening exercises at least twice a week. This is in addition to the moderate-intensity exercise. Spend less time sitting. Even light physical activity can be beneficial. Watch cholesterol and blood lipids Have your blood tested for lipids and cholesterol at 65 years of age, then have this test every 5 years. Have your cholesterol levels checked more often if: Your lipid or cholesterol levels are high. You are older than 65 years of age. You are at high risk for heart disease. What should I know about cancer screening? Depending on your health history and family history, you may need to have cancer screening at various ages. This may include screening for: Breast cancer. Cervical cancer. Colorectal cancer. Skin cancer. Lung cancer. What should I know about heart disease, diabetes, and high blood pressure? Blood pressure and heart disease High blood pressure causes heart disease and increases the risk of stroke. This is more likely  to develop in people who have high blood pressure readings or are overweight. Have your blood pressure checked: Every 3-5 years if you are 42-77 years of age. Every year if you are 83 years old or older. Diabetes Have regular diabetes screenings. This checks your fasting blood sugar level. Have the screening done: Once every three years after age 20 if you are at a normal weight and have a low risk for diabetes. More often and at a younger age if you are overweight or have a high risk for diabetes. What should I know about preventing  infection? Hepatitis B If you have a higher risk for hepatitis B, you should be screened for this virus. Talk with your health care provider to find out if you are at risk for hepatitis B infection. Hepatitis C Testing is recommended for: Everyone born from [birth year] through 1965. Anyone with known risk factors for hepatitis C. Sexually transmitted infections (STIs) Get screened for STIs, including gonorrhea and chlamydia, if: You are sexually active and are younger than 65 years of age. You are older than 66 years of age and your health care provider tells you that you are at risk for this type of infection. Your sexual activity has changed since you were last screened, and you are at increased risk for chlamydia or gonorrhea. Ask your health care provider if you are at risk. Ask your health care provider about whether you are at high risk for HIV. Your health care provider may recommend a prescription medicine to help prevent HIV infection. If you choose to take medicine to prevent HIV, you should first get tested for HIV. You should then be tested every 3 months for as long as you are taking the medicine. Pregnancy If you are about to stop having your period (premenopausal) and you may become pregnant, seek counseling before you get pregnant. Take 400 to 800 micrograms (mcg) of folic acid every day if you become pregnant. Ask for birth control (contraception) if you want to prevent pregnancy. Osteoporosis and menopause Osteoporosis is a disease in which the bones lose minerals and strength with aging. This can result in bone fractures. If you are 106 years old or older, or if you are at risk for osteoporosis and fractures, ask your health care provider if you should: Be screened for bone loss. Take a calcium or vitamin D supplement to lower your risk of fractures. Be given hormone replacement therapy (HRT) to treat symptoms of menopause. Follow these instructions at home: Alcohol use Do not  drink alcohol if: Your health care provider tells you not to drink. You are pregnant, may be pregnant, or are planning to become pregnant. If you drink alcohol: Limit how much you have to: 0-1 drink a day. Know how much alcohol is in your drink. In the [place] one drink equals one 12 oz bottle of beer (355 mL), one 5 oz glass of wine (148 mL), or one 1 oz glass of hard liquor (44 mL). Lifestyle Do not use any products that contain nicotine or tobacco. These products include cigarettes, chewing tobacco, and vaping devices, such as e-cigarettes. If you need help quitting, ask your health care provider. Do not use street drugs. Do not share needles. Ask your health care provider for help if you need support or information about quitting drugs. General instructions Schedule regular health, dental, and eye exams. Stay current with your vaccines. Tell your health care provider if: You often feel depressed. You have ever been abused or  do not feel safe at home. Summary Adopting a healthy lifestyle and getting preventive care are important in promoting health and wellness. Follow your health care provider's instructions about healthy diet, exercising, and getting tested or screened for diseases. Follow your health care provider's instructions on monitoring your cholesterol and blood pressure. This information is not intended to replace advice given to you by your health care provider. Make sure you discuss any questions you have with your health care provider. Document Revised: 08/25/2020 Document Reviewed: 08/25/2020 Elsevier Patient Education  [employer].

## 2021-03-23 ENCOUNTER — Other Ambulatory Visit: Payer: Self-pay | Admitting: Student

## 2021-03-23 ENCOUNTER — Other Ambulatory Visit: Payer: Self-pay | Admitting: Family Medicine

## 2021-03-23 ENCOUNTER — Other Ambulatory Visit: Payer: Self-pay

## 2021-03-23 ENCOUNTER — Ambulatory Visit (INDEPENDENT_AMBULATORY_CARE_PROVIDER_SITE_OTHER): Payer: Medicare Other | Admitting: Obstetrics & Gynecology

## 2021-03-23 ENCOUNTER — Encounter: Payer: Self-pay | Admitting: Obstetrics & Gynecology

## 2021-03-23 VITALS — BP 171/68 | HR 77 | Ht 71.0 in | Wt 175.0 lb

## 2021-03-23 DIAGNOSIS — N870 Mild cervical dysplasia: Secondary | ICD-10-CM

## 2021-03-23 NOTE — Progress Notes (Signed)
     Colposcopy Procedure Note:    Colposcopy Procedure Note  Indications:  2022 LSIL/+18/45//negative 16 2021 Normal cytology/+HR HPV/negaitive 16 and 18   2019 ASCCP recommendation:  Smoker:  Yes.   New sexual partner:  No.  : time frame:  No.  History of abnormal Pap: yes  Procedure Details  The risks and benefits of the procedure and Written informed consent obtained.  Speculum placed in vagina and excellent visualization of cervix achieved, cervix swabbed x 3 with acetic acid solution.  Findings: Adequate colposcopy is noted today.  Cervix: no visible lesions, no mosaicism, no punctation, and no abnormal vasculature; SCJ visualized 360 degrees without lesions and no biopsies taken. Vaginal inspection: vaginal colposcopy not performed. Vulvar colposcopy: vulvar colposcopy not performed.  Specimens: none  Complications: none.  Colposcopic Impression:   Plan(Based on 2019 ASCCP recommendations) Repeat HPV based cytology 1 year

## 2021-04-03 DIAGNOSIS — G8929 Other chronic pain: Secondary | ICD-10-CM | POA: Diagnosis not present

## 2021-04-03 DIAGNOSIS — M0579 Rheumatoid arthritis with rheumatoid factor of multiple sites without organ or systems involvement: Secondary | ICD-10-CM | POA: Diagnosis not present

## 2021-04-03 DIAGNOSIS — R748 Abnormal levels of other serum enzymes: Secondary | ICD-10-CM | POA: Diagnosis not present

## 2021-04-03 DIAGNOSIS — Z79899 Other long term (current) drug therapy: Secondary | ICD-10-CM | POA: Diagnosis not present

## 2021-04-03 DIAGNOSIS — R600 Localized edema: Secondary | ICD-10-CM | POA: Diagnosis not present

## 2021-04-03 DIAGNOSIS — K760 Fatty (change of) liver, not elsewhere classified: Secondary | ICD-10-CM | POA: Diagnosis not present

## 2021-04-03 DIAGNOSIS — M2550 Pain in unspecified joint: Secondary | ICD-10-CM | POA: Diagnosis not present

## 2021-04-03 DIAGNOSIS — Z79891 Long term (current) use of opiate analgesic: Secondary | ICD-10-CM | POA: Diagnosis not present

## 2021-04-03 DIAGNOSIS — M810 Age-related osteoporosis without current pathological fracture: Secondary | ICD-10-CM | POA: Diagnosis not present

## 2021-04-22 ENCOUNTER — Ambulatory Visit: Payer: Medicaid Other

## 2021-04-25 ENCOUNTER — Other Ambulatory Visit: Payer: Self-pay | Admitting: Student

## 2021-04-28 ENCOUNTER — Other Ambulatory Visit: Payer: Self-pay

## 2021-04-28 MED ORDER — POTASSIUM CHLORIDE CRYS ER 20 MEQ PO TBCR: 20.0000 meq | EXTENDED_RELEASE_TABLET | Freq: Every day | ORAL | 0 refills | Status: AC

## 2021-04-28 NOTE — Telephone Encounter (Signed)
Overdue for apt, spouse walked in and made f/u apt 06/2021, 90 day supply e-scribed to [facility]

## 2021-04-29 ENCOUNTER — Encounter: Payer: Self-pay | Admitting: Physician Assistant

## 2021-04-29 ENCOUNTER — Ambulatory Visit (INDEPENDENT_AMBULATORY_CARE_PROVIDER_SITE_OTHER): Payer: Commercial Managed Care - HMO | Admitting: Physician Assistant

## 2021-04-29 ENCOUNTER — Other Ambulatory Visit: Payer: Self-pay

## 2021-04-29 VITALS — BP 156/73 | HR 77 | Temp 97.5°F | Resp 16 | Ht 71.0 in | Wt 175.0 lb

## 2021-04-29 DIAGNOSIS — I739 Peripheral vascular disease, unspecified: Secondary | ICD-10-CM

## 2021-04-29 NOTE — Progress Notes (Signed)
[facility]  HISTORY AND PHYSICAL   History of Present Illness:  Patient is a 66 y.o. year old female who presents for evaluation of   Drug-coated balloon angioplasty right popliteal and SFA with two 5 x 40 mm Ranger balloons for critical limb ischemia right LE.  She had an  extensive right lower extremity wound.  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 Sharol Given.  She is here today for examination of her left LE.  She takes daily ASA, Plavix, and Lipitor for maximum medical management.  She denise pain and non healing wounds.  She is non ambulatory.      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)     Past Surgical History:  Procedure Laterality Date   ABDOMINAL AORTOGRAM W/LOWER EXTREMITY Bilateral 01/28/2020   Procedure: ABDOMINAL AORTOGRAM W/LOWER EXTREMITY;  Surgeon: Waynetta Sandy, MD;  Location: [facility];  Service: Cardiovascular;  Laterality: Bilateral;   ABOVE KNEE LEG AMPUTATION Right 04/30/2020   AMPUTATION Right 04/30/2020   Procedure: RIGHT ABOVE KNEE AMPUTATION;  Surgeon: [name], MD;  Location: [facility];  Service: Orthopedics;  Laterality: Right;   ANKLE SURGERY     Right   CAROTID STENT INSERTION     COLONOSCOPY W/ POLYPECTOMY  11/2008   Dr. Gala Romney. Left-sided diverticula, Pedunculated polyp snared (no  adenomatous changes)   COLONOSCOPY WITH PROPOFOL N/A 01/12/2018   Procedure: COLONOSCOPY WITH PROPOFOL;  Surgeon: Daneil Dolin, MD;  [facility];  Service: Endoscopy;  Laterality: N/A;   FEMUR IM NAIL Right 09/23/2017   Procedure: INTRAMEDULLARY (IM) NAIL FEMORAL;  Surgeon: Renette Butters, MD;  Location: [facility];  Service: Orthopedics;  Laterality: Right;   FOOT SURGERY Right 01/31/2020   FRACTURE SURGERY     Hip replacement in May 2019 - fall related    OOPHORECTOMY     PERIPHERAL VASCULAR BALLOON ANGIOPLASTY Right 01/28/2020   Procedure: PERIPHERAL VASCULAR BALLOON ANGIOPLASTY;  Surgeon: Waynetta Sandy, MD;  Location: [facility];  Service: Cardiovascular;  Laterality: Right;  SFA    ROS:   General:  No weight loss, Fever, chills  HEENT: No recent headaches, no nasal bleeding, no visual changes, no sore throat  Neurologic: No dizziness, blackouts, seizures. No recent symptoms of stroke or mini- stroke. No recent episodes of slurred speech, or temporary blindness.  Cardiac: No recent episodes of chest pain/pressure, no shortness of breath at rest.  No shortness of breath with exertion.  Denies history of atrial fibrillation or irregular heartbeat  Vascular: No history of rest pain in feet.  No history of claudication.  No history of non-healing ulcer, No history of DVT   Pulmonary: No home oxygen, no productive cough, no hemoptysis,  No asthma or wheezing  Musculoskeletal:  [ ]   Arthritis, [ ]  Low back pain,  [ ]  Joint pain  Hematologic:No history of hypercoagulable state.  No history of easy bleeding.  No history of anemia  Gastrointestinal: No hematochezia or melena,  No gastroesophageal reflux, no trouble swallowing  Urinary: [ ]  chronic Kidney disease, [ ]  on HD - [ ]  MWF or [ ]  TTHS, [ ]  Burning with urination, [ ]  Frequent urination, [ ]  Difficulty urinating;   Skin: No rashes  Psychological: No history of anxiety,  No history of  depression  Social History Social History   Tobacco Use   Smoking status: Every Day    Packs/day: 0.50    Years: 15.00    Pack years: 7.50    Types: Cigarettes   Smokeless tobacco: Never  Vaping Use   Vaping Use: Never used  Substance Use Topics   Alcohol use: No    Alcohol/week: 0.0 standard drinks    Comment: quit 6 years ago    Drug use: No    Family History Family History  Problem Relation Age of Onset   Cancer Father        prostate, deceased 21s   Breast cancer Sister        Deceased 44s   Heart attack Mother        deceased 59, during childbirth   Cancer Brother        lymphoma   Colon cancer Maternal Aunt        older than age 31   Liver disease Neg Hx     Allergies  Allergies  Allergen Reactions   Ace Inhibitors Cough    New daily cough since starting ACE     Current Outpatient Medications  Medication Sig Dispense Refill   acetaminophen (TYLENOL) 325 MG tablet Take 650 mg by mouth every 6 (six) hours as needed for mild pain or moderate pain.     alendronate (FOSAMAX) 70 MG tablet Take 70 mg by mouth once a week.     amLODipine (NORVASC) 5 MG tablet TAKE 1 TABLET(5 MG) BY MOUTH DAILY 30 tablet 3   aspirin EC 81 MG tablet Take 81 mg by mouth daily. Swallow whole.     Calcium-Magnesium-Vitamin D (CALCIUM 1200+D3 PO) Take 1 tablet by mouth daily.     Cholecalciferol (VITAMIN D3) 125 MCG (5000 UT) CAPS Take 5,000 Units by mouth daily.     clopidogrel (PLAVIX) 75 MG tablet      ezetimibe (ZETIA) 10 MG tablet TAKE 1 TABLET(10 MG) BY MOUTH DAILY 90 tablet 3   fluticasone (FLONASE) 50 MCG/ACT nasal spray SHAKE LIQUID AND USE 2 SPRAYS IN EACH NOSTRIL DAILY 16 g 6   HYDROcodone-acetaminophen (NORCO/VICODIN) 5-325 MG tablet Take 1-2 tablets by mouth every 4 (four) hours as needed for moderate pain. 30 tablet 0   hydroxychloroquine (PLAQUENIL) 200 MG tablet Take 200 mg by mouth See admin instructions. Take 200 mg twice daily every 3rd day     metoprolol  succinate (TOPROL-XL) 25 MG 24 hr tablet TAKE 1 TABLET(25 MG) BY MOUTH DAILY 30 tablet 11   montelukast (SINGULAIR) 10 MG tablet TAKE 1 TABLET(10 MG) BY MOUTH AT BEDTIME 30 tablet 4   Multiple Vitamin (MULTIVITAMIN WITH MINERALS) TABS tablet Take 1 tablet by mouth daily.     oxybutynin (DITROPAN) 5 MG tablet TAKE 1/2 TABLET(2.5 MG) BY MOUTH TWICE DAILY 90 tablet 3   pantoprazole (PROTONIX) 40 MG tablet TAKE 1 TABLET(40 MG) BY MOUTH DAILY 30 tablet 5   potassium chloride  SA (KLOR-CON M) 20 MEQ tablet Take 1 tablet (20 mEq total) by mouth daily. 90 tablet 0   rosuvastatin (CRESTOR) 20 MG tablet TAKE 1 TABLET BY MOUTH EVERY MONDAY, WEDNESDAY, FRIDAY, AND SATURDAY 48 tablet 6   UNABLE TO FIND Under pads use as needed  Pullups use as needed   Length of need- 99 months DX urinary incontinence 100 each 11   UNABLE TO FIND Incontinence briefs and pads Dx incontinence 100 each 11   torsemide (DEMADEX) 20 MG tablet Take 0.5 tablets (10 mg total) by mouth as needed. 180 tablet 3   No current facility-administered medications for this visit.   Facility-Administered Medications Ordered in Other Visits  Medication Dose Route Frequency Provider Last Rate Last Admin   0.9 %  sodium chloride infusion (Manually program via Guardrails IV Fluids)   Intravenous Once Persons, Bevely [place], [place]        Physical Examination  Vitals:   04/29/21 1451  BP: (!) 156/73  Pulse: 77  Resp: 16  Temp: (!) 97.5 F (36.4 C)  TempSrc: Temporal  SpO2: 100%  Weight: 175 lb (79.4 kg)  Height: 5\' 11"  (1.803 m)    Body mass index is 24.41 kg/m.  General:  Alert and oriented, no acute distress HEENT: Normal Neck: No bruit or JVD Pulmonary: Clear to auscultation bilaterally Cardiac: Regular Rate and Rhythm without murmur Abdomen: Soft, non-tender, non-distended, no mass, no scars Skin: No rash Extremity Pulses:  2+ radial, brachial, doppler dorsalis pedis, posterior tibial left LE.  Left LE without ischemic  changes. Musculoskeletal: No deformity or edema  Neurologic: Upper and lower extremity motor 5/5 and symmetric     ASSESSMENT/Plan:: PAD Drug-coated balloon angioplasty right popliteal and SFA with two 5 x 40 mm Ranger balloons for critical limb ischemia right LE prior attempt at limb salvage.  s/p right AKA by Dr.  Sharol Given    She is asymptomatic for left LE ischemia.  She has good doppler signals in all 3 tibial vessels.  We reviewed symptoms of ischemia and if these happen she will call our office.  She will f/u in 6 months for ABI on the left LE.  Continue ASA, Plavix and Statin daily for maximum medical management.      Roxy Horseman PA-C [facility] Office: [phone]  MD in office [place]

## 2021-05-01 ENCOUNTER — Other Ambulatory Visit: Payer: Self-pay | Admitting: *Deleted

## 2021-05-01 DIAGNOSIS — I739 Peripheral vascular disease, unspecified: Secondary | ICD-10-CM

## 2021-06-02 ENCOUNTER — Other Ambulatory Visit: Payer: Self-pay

## 2021-06-02 ENCOUNTER — Ambulatory Visit (INDEPENDENT_AMBULATORY_CARE_PROVIDER_SITE_OTHER): Payer: Medicare Other | Admitting: Internal Medicine

## 2021-06-02 ENCOUNTER — Encounter: Payer: Self-pay | Admitting: Internal Medicine

## 2021-06-02 DIAGNOSIS — J069 Acute upper respiratory infection, unspecified: Secondary | ICD-10-CM

## 2021-06-02 MED ORDER — BENZONATATE 100 MG PO CAPS: 100.0000 mg | ORAL_CAPSULE | Freq: Two times a day (BID) | ORAL | 0 refills | Status: DC | PRN

## 2021-06-02 MED ORDER — AZITHROMYCIN 250 MG PO TABS: ORAL_TABLET | ORAL | 0 refills | Status: AC

## 2021-06-02 NOTE — Progress Notes (Signed)
Virtual Visit via Telephone Note   This visit type was conducted due to national recommendations for restrictions regarding the COVID-19 Pandemic (e.g. social distancing) in an effort to limit this patient's exposure and mitigate transmission in our community.  Due to her co-morbid illnesses, this patient is at least at moderate risk for complications without adequate follow up.  This format is felt to be most appropriate for this patient at this time.  The patient did not have access to video technology/had technical difficulties with video requiring transitioning to audio format only (telephone).  All issues noted in this document were discussed and addressed.  No physical exam could be performed with this format.  Evaluation Performed:  Follow-up visit  Date:  06/02/2021   ID:  Danielle Cruz, DOB [birth year], MRN [id number]  Patient Location: Home Provider Location: Office/Clinic  Participants: Patient and her husband Location of Patient: Home Location of Provider: Telehealth Consent was obtain for visit to be over via telehealth. I verified that I am speaking with the correct person using two identifiers.  PCP:  Fayrene Helper, MD   Chief Complaint: Cough, rhinorrhea and nasal congestion  History of Present Illness:    Danielle Cruz is a 66 y.o. female who has a televisit for complaint of cough, rhinorrhea and nasal congestion for about a week.  She has tried with Robitussin with no relief.  She does not have any fever or chills currently.  Denies any dyspnea or wheezing.  The patient does not have symptoms concerning for COVID-19 infection (fever, chills, cough, or new shortness of breath).   Past Medical, Surgical, Social History, Allergies, and Medications have been Reviewed.  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   DES to the LAD and the BMS to the OM1- normal   EF   Arthritis    Atherosclerosis of native arteries of extremities with gangrene, right leg (HCC)    CHF (congestive heart failure) ([place]) 10/02/2019   Colonic polyp 2010   Hemorrhoids; h/o mild hematochezia   CVA (cerebral infarction) 08/2008   sizable left -residual expressive aphasia, right sided weakness; ambulates with difficulty with the right leg brace    Hyperlipidemia    Hypertension 2005   Osteoporosis 03/28/2020   Rheumatoid arthritis(714.0)    Stroke [facility])    Past Surgical History:  Procedure Laterality Date   ABDOMINAL AORTOGRAM W/LOWER EXTREMITY Bilateral 01/28/2020   Procedure: ABDOMINAL AORTOGRAM W/LOWER EXTREMITY;  Surgeon: Waynetta Sandy, MD;  Location: [facility];  Service: Cardiovascular;  Laterality: Bilateral;   ABOVE KNEE LEG AMPUTATION Right 04/30/2020   AMPUTATION Right 04/30/2020   Procedure: RIGHT ABOVE KNEE AMPUTATION;  Surgeon: [name], MD;  Location: [facility];  Service: Orthopedics;  Laterality: Right;   ANKLE SURGERY     Right   CAROTID STENT INSERTION     COLONOSCOPY W/ POLYPECTOMY  11/2008   Dr. Gala Romney. Left-sided diverticula, Pedunculated polyp snared (no adenomatous changes)   COLONOSCOPY WITH PROPOFOL N/A 01/12/2018   Procedure: COLONOSCOPY WITH PROPOFOL;  Surgeon: Daneil Dolin, MD;  [facility];  Service: Endoscopy;  Laterality: N/A;   FEMUR IM NAIL Right 09/23/2017   Procedure: INTRAMEDULLARY (IM) NAIL FEMORAL;  Surgeon: Renette Butters, MD;  Location: [facility];  Service: Orthopedics;  Laterality: Right;   FOOT SURGERY Right 01/31/2020   FRACTURE SURGERY  Hip replacement in May 2019 - fall related    OOPHORECTOMY     PERIPHERAL VASCULAR BALLOON ANGIOPLASTY Right 01/28/2020   Procedure: PERIPHERAL VASCULAR BALLOON ANGIOPLASTY;  Surgeon: Waynetta Sandy, MD;  Location: [facility];  Service: Cardiovascular;  Laterality: Right;  SFA     Current Meds  Medication Sig   acetaminophen (TYLENOL)  325 MG tablet Take 650 mg by mouth every 6 (six) hours as needed for mild pain or moderate pain.   alendronate (FOSAMAX) 70 MG tablet Take 70 mg by mouth once a week.   amLODipine (NORVASC) 5 MG tablet TAKE 1 TABLET(5 MG) BY MOUTH DAILY   aspirin EC 81 MG tablet Take 81 mg by mouth daily. Swallow whole.   azithromycin (ZITHROMAX) 250 MG tablet Take 2 tablets on day 1, then 1 tablet daily on days 2 through 5   benzonatate (TESSALON) 100 MG capsule Take 1 capsule (100 mg total) by mouth 2 (two) times daily as needed for cough.   Calcium-Magnesium-Vitamin D (CALCIUM 1200+D3 PO) Take 1 tablet by mouth daily.   Cholecalciferol (VITAMIN D3) 125 MCG (5000 UT) CAPS Take 5,000 Units by mouth daily.   clopidogrel (PLAVIX) 75 MG tablet    ezetimibe (ZETIA) 10 MG tablet TAKE 1 TABLET(10 MG) BY MOUTH DAILY   fluticasone (FLONASE) 50 MCG/ACT nasal spray SHAKE LIQUID AND USE 2 SPRAYS IN EACH NOSTRIL DAILY   HYDROcodone-acetaminophen (NORCO/VICODIN) 5-325 MG tablet Take 1-2 tablets by mouth every 4 (four) hours as needed for moderate pain.   hydroxychloroquine (PLAQUENIL) 200 MG tablet Take 200 mg by mouth See admin instructions. Take 200 mg twice daily every 3rd day   metoprolol succinate (TOPROL-XL) 25 MG 24 hr tablet TAKE 1 TABLET(25 MG) BY MOUTH DAILY   montelukast (SINGULAIR) 10 MG tablet TAKE 1 TABLET(10 MG) BY MOUTH AT BEDTIME   Multiple Vitamin (MULTIVITAMIN WITH MINERALS) TABS tablet Take 1 tablet by mouth daily.   oxybutynin (DITROPAN) 5 MG tablet TAKE 1/2 TABLET(2.5 MG) BY MOUTH TWICE DAILY   pantoprazole (PROTONIX) 40 MG tablet TAKE 1 TABLET(40 MG) BY MOUTH DAILY   potassium chloride SA (KLOR-CON M) 20 MEQ tablet Take 1 tablet (20 mEq total) by mouth daily.   rosuvastatin (CRESTOR) 20 MG tablet TAKE 1 TABLET BY MOUTH EVERY MONDAY, WEDNESDAY, FRIDAY, AND SATURDAY   UNABLE TO FIND Under pads use as needed  Pullups use as needed   Length of need- 99 months DX urinary incontinence   UNABLE TO FIND  Incontinence briefs and pads Dx incontinence     Allergies:   Ace inhibitors   ROS:   Please see the history of present illness.     All other systems reviewed and are negative.   Labs/Other Tests and Data Reviewed:    Recent Labs: 02/18/2021: ALT 34; BUN 8; Creatinine, Ser 0.93; Hemoglobin 13.3; Platelets 203; Potassium 3.8; Sodium 139; TSH 3.160   Recent Lipid Panel Lab Results  Component Value Date/Time   CHOL 148 02/18/2021 11:25 AM   TRIG 147 02/18/2021 11:25 AM   HDL 37 (L) 02/18/2021 11:25 AM   CHOLHDL 4.0 02/18/2021 11:25 AM   CHOLHDL 4.2 01/10/2020 09:43 AM   LDLCALC 85 02/18/2021 11:25 AM   LDLCALC 49 01/10/2020 09:43 AM    Wt Readings from Last 3 Encounters:  04/29/21 175 lb (79.4 kg)  03/23/21 175 lb (79.4 kg)  10/24/20 185 lb (83.9 kg)     ASSESSMENT & PLAN:    URTI Started azithromycin as she  has persistent symptoms despite symptomatic treatment Continue with Robitussin as needed for cough Tessalon as needed for cough  Time:   Today, I have spent 9 minutes reviewing the chart, including problem list, medications, and with the patient with telehealth technology discussing the above problems.   Medication Adjustments/Labs and Tests Ordered: Current medicines are reviewed at length with the patient today.  Concerns regarding medicines are outlined above.   Tests Ordered: No orders of the defined types were placed in this encounter.   Medication Changes: Meds ordered this encounter  Medications   azithromycin (ZITHROMAX) 250 MG tablet    Sig: Take 2 tablets on day 1, then 1 tablet daily on days 2 through 5    Dispense:  6 tablet    Refill:  0   benzonatate (TESSALON) 100 MG capsule    Sig: Take 1 capsule (100 mg total) by mouth 2 (two) times daily as needed for cough.    Dispense:  20 capsule    Refill:  0     Note: This dictation was prepared with Dragon dictation along with smaller phrase technology. Similar sounding words can be  transcribed inadequately or may not be corrected upon review. Any transcriptional errors that result from this process are unintentional.      Disposition:  Follow up  Signed, Lindell Spar, MD  06/02/2021 3:14 PM     [facility]

## 2021-06-15 NOTE — Progress Notes (Signed)
Cardiology Office Note    Date:  06/22/2021   ID:  Danielle Cruz, DOB Feb 17, 1956, MRN [id number]   PCP:  Danielle Helper, MD   [place]  Cardiologist:  Danielle Dolly, MD   Advanced Practice Provider:  No care team member to display Electrophysiologist:  None   [id number]}   Chief Complaint  Patient presents with   Follow-up    History of Present Illness:  Danielle Cruz is a 66 y.o. 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  Last had telemedicine visit with Danielle Cruz 05/13/20 and torsemide reduced to 10 mg prn.  Patient comes in with her husband. Has had a cough for a month-clear sputum-was given an antibiotic by Danielle Cruz but it didn't help. She is nauseated from it and refuses to take afternoon meds. Only eating soup and crackers. Has lost 5-10 lbs. No fever.  She smokes in the house 1/2 ppd.   Past Medical History:  Diagnosis Date   AKI (acute kidney injury) ([place]) 09/22/2017   Anemia    Arteriosclerotic cardiovascular disease (ASCVD) 07/2003   DES to the LAD and the BMS to the OM1- normal  EF   Arthritis    Atherosclerosis of native arteries of extremities with gangrene, right leg (HCC)    CHF (congestive heart failure) ([place]) 10/02/2019   Colonic polyp 2010   Hemorrhoids; h/o mild hematochezia   CVA (cerebral infarction) 08/2008   sizable left -residual expressive aphasia, right sided weakness; ambulates with difficulty with the right leg brace    Hyperlipidemia    Hypertension 2005   Osteoporosis 03/28/2020   Rheumatoid arthritis(714.0)    Stroke [facility])     Past Surgical History:  Procedure Laterality Date   ABDOMINAL AORTOGRAM W/LOWER EXTREMITY Bilateral 01/28/2020   Procedure:  ABDOMINAL AORTOGRAM W/LOWER EXTREMITY;  Surgeon: Waynetta Sandy, MD;  Location: [place] [facility];  Service: Cardiovascular;  Laterality: Bilateral;   ABOVE KNEE LEG AMPUTATION Right 04/30/2020   AMPUTATION Right 04/30/2020   Procedure: RIGHT ABOVE KNEE AMPUTATION;  Surgeon: [name], MD;  Location: [place];  Service: Orthopedics;  Laterality: Right;   ANKLE SURGERY     Right   CAROTID STENT INSERTION     COLONOSCOPY W/ POLYPECTOMY  11/2008   Dr. Gala Romney. Left-sided diverticula, Pedunculated polyp snared (no adenomatous changes)   COLONOSCOPY WITH PROPOFOL N/A 01/12/2018   Procedure: COLONOSCOPY WITH PROPOFOL;  Surgeon: Daneil Dolin, MD;  [facility];  Service: Endoscopy;  Laterality: N/A;   FEMUR IM NAIL Right 09/23/2017   Procedure: INTRAMEDULLARY (IM) NAIL FEMORAL;  Surgeon: Renette Butters, MD;  Location: [place];  Service: Orthopedics;  Laterality: Right;   FOOT SURGERY Right 01/31/2020   FRACTURE SURGERY     Hip replacement in May 2019 - fall related    OOPHORECTOMY     PERIPHERAL VASCULAR BALLOON ANGIOPLASTY Right 01/28/2020   Procedure: PERIPHERAL VASCULAR BALLOON ANGIOPLASTY;  Surgeon: Waynetta Sandy, MD;  Location: [place] [facility];  Service: Cardiovascular;  Laterality: Right;  SFA    Current Medications: Current Meds  Medication Sig   acetaminophen (TYLENOL) 325 MG tablet Take 650 mg by mouth every 6 (six) hours as needed for mild pain  or moderate pain.   alendronate (FOSAMAX) 70 MG tablet Take 70 mg by mouth once a week.   amLODipine (NORVASC) 5 MG tablet TAKE 1 TABLET(5 MG) BY MOUTH DAILY   aspirin EC 81 MG tablet Take 81 mg by mouth daily. Swallow whole.   benzonatate (TESSALON) 100 MG capsule Take 1 capsule (100 mg total) by mouth 2 (two) times daily as needed for cough.   Calcium-Magnesium-Vitamin D (CALCIUM 1200+D3 PO) Take 1 tablet by mouth daily.   Cholecalciferol (VITAMIN D3) 125 MCG (5000 UT) CAPS Take 5,000 Units by mouth  daily.   clopidogrel (PLAVIX) 75 MG tablet    ezetimibe (ZETIA) 10 MG tablet TAKE 1 TABLET(10 MG) BY MOUTH DAILY   fluticasone (FLONASE) 50 MCG/ACT nasal spray SHAKE LIQUID AND USE 2 SPRAYS IN EACH NOSTRIL DAILY   HYDROcodone-acetaminophen (NORCO/VICODIN) 5-325 MG tablet Take 1-2 tablets by mouth every 4 (four) hours as needed for moderate pain.   hydroxychloroquine (PLAQUENIL) 200 MG tablet Take 200 mg by mouth See admin instructions. Take 200 mg twice daily every 3rd day   metoprolol succinate (TOPROL-XL) 25 MG 24 hr tablet TAKE 1 TABLET(25 MG) BY MOUTH DAILY   montelukast (SINGULAIR) 10 MG tablet TAKE 1 TABLET(10 MG) BY MOUTH AT BEDTIME   Multiple Vitamin (MULTIVITAMIN WITH MINERALS) TABS tablet Take 1 tablet by mouth daily.   oxybutynin (DITROPAN) 5 MG tablet TAKE 1/2 TABLET(2.5 MG) BY MOUTH TWICE DAILY   pantoprazole (PROTONIX) 40 MG tablet TAKE 1 TABLET(40 MG) BY MOUTH DAILY   potassium chloride SA (KLOR-CON M) 20 MEQ tablet Take 1 tablet (20 mEq total) by mouth daily.   rosuvastatin (CRESTOR) 20 MG tablet TAKE 1 TABLET BY MOUTH EVERY MONDAY, WEDNESDAY, FRIDAY, AND SATURDAY   UNABLE TO FIND Under pads use as needed  Pullups use as needed   Length of need- 99 months DX urinary incontinence   UNABLE TO FIND Incontinence briefs and pads Dx incontinence     Allergies:   Ace inhibitors   Social History   Socioeconomic History   Marital status: Married    Spouse name: Not on file   Number of children: 3   Years of education: 11th grade   Highest education level: 11th grade  Occupational History   Occupation: disabilty x 9years    Comment: secondary to arthritis   Tobacco Use   Smoking status: Every Day    Packs/day: 0.50    Years: 15.00    Pack years: 7.50    Types: Cigarettes   Smokeless tobacco: Never  Vaping Use   Vaping Use: Never used  Substance and Sexual Activity   Alcohol use: No    Alcohol/week: 0.0 standard drinks    Comment: quit 6 years ago    Drug use: No    Sexual activity: Not Currently    Birth control/protection: Post-menopausal  Other Topics Concern   Not on file  Social History Narrative   Pt gave birth to 4 children, 1 deceased at 80 weeks was a premature baby, 3 are living ages range 51 to 6 all healthy    Social Determinants of Radio broadcast assistant Strain: Low Risk    Difficulty of Paying Living Expenses: Not hard at all  Food Insecurity: No Food Insecurity   Worried About Charity fundraiser in the Last Year: Never true   [place] in the Last Year: Never true  Transportation Needs: No Transportation Needs   Lack of Transportation (Medical): No  Lack of Transportation (Non-Medical): No  Physical Activity: Inactive   Days of Exercise per Week: 0 days   Minutes of Exercise per Session: 0 min  Stress: Not on file  Social Connections: Moderately Isolated   Frequency of Communication with Friends and Family: Once a week   Frequency of Social Gatherings with Friends and Family: Once a week   Attends Religious Services: 1 to 4 times per year   Active Member of [employer] or Organizations: No   Attends Music therapist: Never   Marital Status: Married     Family History:  The patient's  family history includes Breast cancer in her sister; Cancer in her brother and father; Colon cancer in her maternal aunt; Heart attack in her mother.   ROS:   Please see the history of present illness.    ROS All other systems reviewed and are negative.   PHYSICAL EXAM:   VS:  BP 120/66    Pulse 76    Ht 5\' 7"  (1.702 m)    Wt 165 lb (74.8 kg) Comment: per spouse   SpO2 99%    BMI 25.84 kg/m   Physical Exam  GEN: Well nourished, well developed, in no acute distress  Neck: no JVD, carotid bruits, or masses Cardiac:RRR; no murmurs, rubs, or gallops  Respiratory: decreased breath sound but clear to auscultation bilaterally, normal work of breathing GI: soft, nontender, nondistended, + BS Ext: Right AKA left without  cyanosis, clubbing, or edema, Good distal pulses bilaterally Neuro:  aphasic Psych: euthymic mood, full affect  Wt Readings from Last 3 Encounters:  06/22/21 165 lb (74.8 kg)  04/29/21 175 lb (79.4 kg)  03/23/21 175 lb (79.4 kg)      Studies/Labs Reviewed:   EKG:  EKG is  ordered today.  The ekg ordered today demonstrates NSR with old ant infarct unchanged  Recent Labs: 02/18/2021: ALT 34; BUN 8; Creatinine, Ser 0.93; Hemoglobin 13.3; Platelets 203; Potassium 3.8; Sodium 139; TSH 3.160   Lipid Panel    Component Value Date/Time   CHOL 148 02/18/2021 1125   TRIG 147 02/18/2021 1125   HDL 37 (L) 02/18/2021 1125   CHOLHDL 4.0 02/18/2021 1125   CHOLHDL 4.2 01/10/2020 0943   VLDL 42 (H) 05/04/2016 1035   LDLCALC 85 02/18/2021 1125   LDLCALC 49 01/10/2020 0943    Additional studies/ records that were reviewed today include:  Echocardiogram: 09/2019 IMPRESSIONS     1. Left ventricular ejection fraction, by estimation, is 60 to 65%. The  left ventricle has normal function. The left ventricle has no regional  wall motion abnormalities. There is mild left ventricular hypertrophy.  Left ventricular diastolic parameters  are consistent with Grade I diastolic dysfunction (impaired relaxation).  Elevated left atrial pressure.   2. Right ventricular systolic function is normal. The right ventricular  size is normal.   3. The mitral valve is normal in structure. No evidence of mitral valve  regurgitation. No evidence of mitral stenosis.   4. The aortic valve has an indeterminant number of cusps. Aortic valve  regurgitation is mild. Mild to moderate aortic valve  sclerosis/calcification is present, without any evidence of aortic  stenosis.   5. The inferior vena cava is normal in size with greater than 50%  respiratory variability, suggesting right atrial pressure of 3 mmHg.    Lower Extremity Angiography: 01/2020 Pre-operative Diagnosis: Critical right lower extremity ischemia  with wounds Post-operative diagnosis:  Same Surgeon:  Erlene Quan C. Donzetta Matters, MD Procedure Performed:  1.  Ultrasound-guided cannulation left common femoral artery 2.  Aortogram with bilateral lower extremity runoff 3.  Drug-coated balloon angioplasty right popliteal and SFA with two 5 x 40 mm Ranger balloons 4.  Minx device closure left common femoral artery 5.  Moderate sedation with fentanyl Versed for 7 minutes    Indications: 66 year old female with history of stroke she is now nonverbal has limited motion of the right lower extremity.  She has persistent swelling in the right leg she has decreased ABIs she is indicated for aortogram due to wounds on the right lower extremity.   Findings: Aortoiliac segments are free of flow-limiting stenosis.  The left common femoral reaccessed appears she has heavy calcific disease extending into the proximal SFA.  Right lower extremity which is the site of interest SFA has 1 area at the distal SFA proximal 80% stenosis after angioplasty is down to 0%.  Popliteal artery at the joint demonstrates 80% stenosis resolved to 0%.  She has single-vessel runoff on the right via anterior tibial artery.  The left side she does have heavy calcified disease in the common femoral artery extending in the proximal SFA no flow-limiting stenosis distally appears to have 3 vessels running to the foot.     Risk Assessment/Calculations:         ASSESSMENT:    1. Coronary artery disease involving native coronary artery of native heart without angina pectoris   2. PVD (peripheral vascular disease) ([place])   3. Essential hypertension   4. Hyperlipidemia LDL goal <70   5. Chronic cough   6. Tobacco abuse      PLAN:  In order of problems listed above:  CAD DES LAD & OM 2005-no angina on Plavix & ASA, toprol amlodipine, crestor and zetia  PAD R AKA 04/30/20  HTN well controlled today  HLD LDL 85 02/2021  Cough/Tobacco abuse-clear sputum, antibiotics didn't help, lungs  clear. Continues to smoke in the house which I counseled her on. Check CXR and CT for lung cancer screening. Smoking cessation, muccinex, flonase, claritin. If cough doesn't improve f/u with PCP.  Shared Decision Making/Informed Consent        Medication Adjustments/Labs and Tests Ordered: Current medicines are reviewed at length with the patient today.  Concerns regarding medicines are outlined above.  Medication changes, Labs and Tests ordered today are listed in the Patient Instructions below. Patient Instructions  Medication Instructions:  Your physician recommends that you continue on your current medications as directed. Please refer to the Current Medication list given to you today.   Labwork: None today  Testing/Procedures: Chest x-ray today   Schedule lung cancer screen  chest CT  Follow-Up: 6 months  Any Other Special Instructions Will Be Listed Below (If Applicable).  No smoking in the house  Use Mucinex for cough  Use Claritin for allergies  Use Flonase nasal spray  If you need a refill on your cardiac medications before your next appointment, please call your pharmacy.    Signed, Ermalinda Barrios, PA-C  06/22/2021 2:10 PM    [facility] [place], [place], [place]  [address] Phone: [phone]; Fax: [phone]

## 2021-06-22 ENCOUNTER — Ambulatory Visit (HOSPITAL_COMMUNITY)
Admission: RE | Admit: 2021-06-22 | Discharge: 2021-06-22 | Disposition: A | Discharge status: Home / Self Care | Payer: Medicare Other | Source: Ambulatory Visit | Attending: Physician Assistant | Admitting: Physician Assistant

## 2021-06-22 ENCOUNTER — Ambulatory Visit (INDEPENDENT_AMBULATORY_CARE_PROVIDER_SITE_OTHER): Payer: Medicare Other | Admitting: Physician Assistant

## 2021-06-22 ENCOUNTER — Other Ambulatory Visit: Payer: Self-pay

## 2021-06-22 ENCOUNTER — Encounter: Payer: Self-pay | Admitting: Physician Assistant

## 2021-06-22 VITALS — BP 120/66 | HR 76 | Ht 67.0 in | Wt 165.0 lb

## 2021-06-22 DIAGNOSIS — I10 Essential (primary) hypertension: Secondary | ICD-10-CM | POA: Diagnosis not present

## 2021-06-22 DIAGNOSIS — R053 Chronic cough: Secondary | ICD-10-CM

## 2021-06-22 DIAGNOSIS — Z720 Tobacco use: Secondary | ICD-10-CM | POA: Diagnosis not present

## 2021-06-22 DIAGNOSIS — E785 Hyperlipidemia, unspecified: Secondary | ICD-10-CM

## 2021-06-22 DIAGNOSIS — R059 Cough, unspecified: Secondary | ICD-10-CM | POA: Diagnosis not present

## 2021-06-22 DIAGNOSIS — I739 Peripheral vascular disease, unspecified: Secondary | ICD-10-CM

## 2021-06-22 DIAGNOSIS — I2510 Atherosclerotic heart disease of native coronary artery without angina pectoris: Secondary | ICD-10-CM

## 2021-06-22 NOTE — Patient Instructions (Addendum)
Medication Instructions:  ?Your physician recommends that you continue on your current medications as directed. Please refer to the Current Medication list given to you today. ? ? ?Labwork: ?None today ? ?Testing/Procedures: ?Chest x-ray today ? ? ?Schedule lung cancer screen  chest CT ? ?Follow-Up: ?6 months ? ?Any Other Special Instructions Will Be Listed Below (If Applicable). ? ?No smoking in the house ? ?Use Mucinex for cough ? ?Use Claritin for allergies ? ?Use Flonase nasal spray ? ?If you need a refill on your cardiac medications before your next appointment, please call your pharmacy. ? ?

## 2021-06-23 ENCOUNTER — Telehealth: Payer: Self-pay | Admitting: Family Medicine

## 2021-06-23 ENCOUNTER — Other Ambulatory Visit: Payer: Self-pay | Admitting: Family Medicine

## 2021-06-23 NOTE — Telephone Encounter (Signed)
Patient needs refills on  ? ?pantoprazole (PROTONIX) 40 MG tablet  ? ?amLODipine (NORVASC) 5 MG tablet ? ?ezetimibe (ZETIA) 10 MG tablet [ ? ?montelukast (SINGULAIR) 10 MG tablet  ? ?fluticasone (FLONASE) 50 MCG/ACT nasal spray  ? ?oxybutynin (DITROPAN) 5 MG tablet  ?

## 2021-06-24 ENCOUNTER — Other Ambulatory Visit: Payer: Self-pay | Admitting: Family Medicine

## 2021-06-24 ENCOUNTER — Other Ambulatory Visit: Payer: Self-pay

## 2021-06-24 DIAGNOSIS — E785 Hyperlipidemia, unspecified: Secondary | ICD-10-CM

## 2021-06-24 DIAGNOSIS — Z1231 Encounter for screening mammogram for malignant neoplasm of breast: Secondary | ICD-10-CM

## 2021-06-24 DIAGNOSIS — I10 Essential (primary) hypertension: Secondary | ICD-10-CM

## 2021-06-24 MED ORDER — MONTELUKAST SODIUM 10 MG PO TABS: ORAL_TABLET | ORAL | 4 refills | Status: AC

## 2021-06-24 MED ORDER — OXYBUTYNIN CHLORIDE 5 MG PO TABS: ORAL_TABLET | ORAL | 3 refills | Status: AC

## 2021-06-24 MED ORDER — PANTOPRAZOLE SODIUM 40 MG PO TBEC: DELAYED_RELEASE_TABLET | ORAL | 5 refills | Status: AC

## 2021-06-24 MED ORDER — EZETIMIBE 10 MG PO TABS: ORAL_TABLET | ORAL | 3 refills | Status: AC

## 2021-06-24 MED ORDER — AMLODIPINE BESYLATE 5 MG PO TABS: ORAL_TABLET | ORAL | 3 refills | Status: DC

## 2021-06-24 MED ORDER — FLUTICASONE PROPIONATE 50 MCG/ACT NA SUSP: NASAL | 6 refills | Status: AC

## 2021-06-24 NOTE — Telephone Encounter (Signed)
Refill sent.

## 2021-07-01 ENCOUNTER — Encounter: Payer: Self-pay | Admitting: Family Medicine

## 2021-07-01 ENCOUNTER — Other Ambulatory Visit: Payer: Self-pay

## 2021-07-01 ENCOUNTER — Ambulatory Visit (INDEPENDENT_AMBULATORY_CARE_PROVIDER_SITE_OTHER): Payer: Medicare Other | Admitting: Family Medicine

## 2021-07-01 DIAGNOSIS — R5383 Other fatigue: Secondary | ICD-10-CM

## 2021-07-01 DIAGNOSIS — R630 Anorexia: Secondary | ICD-10-CM | POA: Diagnosis not present

## 2021-07-01 DIAGNOSIS — R053 Chronic cough: Secondary | ICD-10-CM

## 2021-07-01 NOTE — Patient Instructions (Addendum)
I recommend that you go to the ED today for further evaluation ? ?You have been treated for a cough that has worsened in the past 4 weeks, your energy level and appetitie have significantly worsened in the past week ? ? ?I have spoken directly with the ED Physician ? ?Thanks for choosing [facility] we consider it a privelige to serve you. ? ? ?

## 2021-07-01 NOTE — Progress Notes (Signed)
Virtual Visit via Telephone Note ? ?I connected with Danielle Cruz  daughter Danielle Cruz on 07/01/21 at  2:00 PM EDT by telephone and verified that I am speaking with the correct person using two identifiers. ? ?Location: ?Patient: home ?Provider: office ?  ?I discussed the limitations, risks, security and privacy concerns of performing an evaluation and management service by telephone and the availability of in person appointments. I also discussed with the patient that there may be a patient responsible charge related to this service. The patient expressed understanding and agreed to proceed. ? ? ?History of Present Illness: ?Historian is her daughter ?1  week h/o poor apetite, coughing a lot thick phlegm, excess cough x 4 weeks, clear sputum. ?Decreased energy, lying in bed and not getting up and in chair for past 1 week ?  ?Observations/Objective: ?There were no vitals taken for this visit. ?Pt has expressive aphasia ? ? ?Assessment and Plan: ?Cough ?4 week h/o cough for which she has been treated with persistence, and development of increased fatigue and poor appetite, at high risk for pneumonia and severe illness based on co morbidities, ED eval recoimmended ? ?Fatigue ?1 week history of increased fatigue with persistent cough, needs ED eval ? ?Poor appetite ?1 wek history of notably reduced appetite , ED eval recommended as has other costituitional symptoms ? ? ?Follow Up Instructions: ? ?  ?I discussed the assessment and treatment plan with the patient. The patient was provided an opportunity to ask questions and all were answered. The patient agreed with the plan and demonstrated an understanding of the instructions. ?  ?The patient was advised to call back or seek an in-person evaluation if the symptoms worsen or if the condition fails to improve as anticipated. ? ?I provided 13 minutes of non-face-to-face time during this encounter. ? ? ?Tula Nakayama, MD ? ?

## 2021-07-02 ENCOUNTER — Other Ambulatory Visit: Payer: Self-pay

## 2021-07-02 ENCOUNTER — Emergency Department (HOSPITAL_COMMUNITY)
Admission: EM | Admit: 2021-07-02 | Discharge: 2021-07-02 | Disposition: A | Discharge status: Home / Self Care | Payer: Medicare Other | Attending: Emergency Medicine | Admitting: Emergency Medicine

## 2021-07-02 ENCOUNTER — Emergency Department (HOSPITAL_COMMUNITY): Payer: Medicare Other

## 2021-07-02 ENCOUNTER — Encounter (HOSPITAL_COMMUNITY): Payer: Self-pay

## 2021-07-02 DIAGNOSIS — R0602 Shortness of breath: Secondary | ICD-10-CM | POA: Diagnosis not present

## 2021-07-02 DIAGNOSIS — Z743 Need for continuous supervision: Secondary | ICD-10-CM | POA: Diagnosis not present

## 2021-07-02 DIAGNOSIS — Z79899 Other long term (current) drug therapy: Secondary | ICD-10-CM | POA: Insufficient documentation

## 2021-07-02 DIAGNOSIS — R6889 Other general symptoms and signs: Secondary | ICD-10-CM | POA: Diagnosis not present

## 2021-07-02 DIAGNOSIS — J209 Acute bronchitis, unspecified: Secondary | ICD-10-CM | POA: Insufficient documentation

## 2021-07-02 DIAGNOSIS — R29898 Other symptoms and signs involving the musculoskeletal system: Secondary | ICD-10-CM | POA: Diagnosis not present

## 2021-07-02 DIAGNOSIS — Z20822 Contact with and (suspected) exposure to covid-19: Secondary | ICD-10-CM | POA: Insufficient documentation

## 2021-07-02 DIAGNOSIS — Z7982 Long term (current) use of aspirin: Secondary | ICD-10-CM | POA: Diagnosis not present

## 2021-07-02 DIAGNOSIS — R059 Cough, unspecified: Secondary | ICD-10-CM | POA: Diagnosis not present

## 2021-07-02 DIAGNOSIS — N390 Urinary tract infection, site not specified: Secondary | ICD-10-CM | POA: Diagnosis not present

## 2021-07-02 DIAGNOSIS — R079 Chest pain, unspecified: Secondary | ICD-10-CM | POA: Diagnosis not present

## 2021-07-02 LAB — CBC WITH DIFFERENTIAL/PLATELET
Abs Immature Granulocytes: 0.05 10*3/uL (ref 0.00–0.07)
Basophils Absolute: 0 10*3/uL (ref 0.0–0.1)
Basophils Relative: 0 %
Eosinophils Absolute: 0.2 10*3/uL (ref 0.0–0.5)
Eosinophils Relative: 2 %
HCT: 35.3 % — ABNORMAL LOW (ref 36.0–46.0)
Hemoglobin: 11 g/dL — ABNORMAL LOW (ref 12.0–15.0)
Immature Granulocytes: 0 %
Lymphocytes Relative: 15 %
Lymphs Abs: 1.7 10*3/uL (ref 0.7–4.0)
MCH: 29.2 pg (ref 26.0–34.0)
MCHC: 31.2 g/dL (ref 30.0–36.0)
MCV: 93.6 fL (ref 80.0–100.0)
Monocytes Absolute: 1.2 10*3/uL — ABNORMAL HIGH (ref 0.1–1.0)
Monocytes Relative: 11 %
Neutro Abs: 8.2 10*3/uL — ABNORMAL HIGH (ref 1.7–7.7)
Neutrophils Relative %: 72 %
Platelets: 274 10*3/uL (ref 150–400)
RBC: 3.77 MIL/uL — ABNORMAL LOW (ref 3.87–5.11)
RDW: 15 % (ref 11.5–15.5)
WBC: 11.3 10*3/uL — ABNORMAL HIGH (ref 4.0–10.5)
nRBC: 0 % (ref 0.0–0.2)

## 2021-07-02 LAB — URINALYSIS, ROUTINE W REFLEX MICROSCOPIC
Bilirubin Urine: NEGATIVE
Glucose, UA: NEGATIVE mg/dL
Hgb urine dipstick: NEGATIVE
Ketones, ur: 5 mg/dL — AB
Leukocytes,Ua: NEGATIVE
Nitrite: NEGATIVE
Protein, ur: >=300 mg/dL — AB
Specific Gravity, Urine: 1.022 (ref 1.005–1.030)
pH: 6 (ref 5.0–8.0)

## 2021-07-02 LAB — RESP PANEL BY RT-PCR (FLU A&B, COVID) ARPGX2
Influenza A by PCR: NEGATIVE
Influenza B by PCR: NEGATIVE
SARS Coronavirus 2 by RT PCR: NEGATIVE

## 2021-07-02 LAB — COMPREHENSIVE METABOLIC PANEL
ALT: 57 U/L — ABNORMAL HIGH (ref 0–44)
AST: 53 U/L — ABNORMAL HIGH (ref 15–41)
Albumin: 2.3 g/dL — ABNORMAL LOW (ref 3.5–5.0)
Alkaline Phosphatase: 102 U/L (ref 38–126)
Anion gap: 9 (ref 5–15)
BUN: 10 mg/dL (ref 8–23)
CO2: 24 mmol/L (ref 22–32)
Calcium: 8.3 mg/dL — ABNORMAL LOW (ref 8.9–10.3)
Chloride: 105 mmol/L (ref 98–111)
Creatinine, Ser: 0.87 mg/dL (ref 0.44–1.00)
GFR, Estimated: >60 mL/min (ref >60)
Glucose, Bld: 93 mg/dL (ref 70–99)
Potassium: 3.3 mmol/L — ABNORMAL LOW (ref 3.5–5.1)
Sodium: 138 mmol/L (ref 135–145)
Total Bilirubin: 0.6 mg/dL (ref 0.3–1.2)
Total Protein: 7.3 g/dL (ref 6.5–8.1)

## 2021-07-02 MED ORDER — CEPHALEXIN 500 MG PO CAPS: 500.0000 mg | ORAL_CAPSULE | Freq: Four times a day (QID) | ORAL | 0 refills | Status: DC

## 2021-07-02 MED ORDER — CEPHALEXIN 500 MG PO CAPS
500.0000 mg | ORAL_CAPSULE | Freq: Once | ORAL | Status: AC
  Administered 2021-07-02: 500 mg via ORAL
  Filled 2021-07-02: qty 1

## 2021-07-02 NOTE — Discharge Instructions (Addendum)
See your Physician for recheck and repeat chest xray  Your urine is being cultured for infection.  ?

## 2021-07-02 NOTE — ED Notes (Signed)
Patient still unable to provide urine at this time. Patient encouraged to do an in and out cath, but patient shakes her head no.  ?

## 2021-07-02 NOTE — ED Triage Notes (Signed)
Pt bib ems from home for congestion and cough x 4 weeks.  Pt is R BKA.  Clear sputum.  98% RA.  No increased wob.  Denies fever.  Hx of cva in 2010, L sided deficits and is nonverbal.  Family on the way to ed.    ?

## 2021-07-02 NOTE — ED Provider Notes (Signed)
?[facility] ?Provider Note ? ? ?CSN: [id number] ?Arrival date & time: 07/02/21  1300 ? ?  ? ?History ? ?Chief Complaint  ?Patient presents with  ? Cough  ? ? ?Danielle Cruz is a 66 y.o. female. ? ?The history is provided by the patient and a relative. No language interpreter was used.  ?Cough ?Cough characteristics:  Productive ?Sputum characteristics:  Unable to specify ?Severity:  Moderate ?Onset quality:  Gradual ?Duration:  4 weeks ?Timing:  Constant ?Progression:  Worsening ?Chronicity:  New ?Smoker: no   ?Context: not sick contacts   ?Relieved by:  Nothing ?Worsened by:  Nothing ?Associated symptoms: no fever   ?Risk factors: no recent infection   ? ?  ? ?Home Medications ?Prior to Admission medications   ?Medication Sig Start Date End Date Taking? Authorizing Provider  ?acetaminophen (TYLENOL) 325 MG tablet Take 650 mg by mouth every 6 (six) hours as needed for mild pain or moderate pain.   Yes [provider]  ?alendronate (FOSAMAX) 70 MG tablet Take 70 mg by mouth once a week. 03/03/21  Yes [provider]  ?amLODipine (NORVASC) 5 MG tablet TAKE 1 TABLET(5 MG) BY MOUTH DAILY 06/24/21  Yes Fayrene Helper, MD  ?Ascorbic Acid (VITAMIN C) 1000 MG tablet Take 1,000 mg by mouth daily.   Yes [provider]  ?aspirin EC 81 MG tablet Take 81 mg by mouth daily. Swallow whole.   Yes [provider]  ?benzonatate (TESSALON) 100 MG capsule Take 1 capsule (100 mg total) by mouth 2 (two) times daily as needed for cough. 06/02/21  Yes Lindell Spar, MD  ?Calcium-Magnesium-Vitamin D (CALCIUM 1200+D3 PO) Take 1 tablet by mouth daily.   Yes [provider]  ?cetirizine (ZYRTEC) 10 MG tablet Take 10 mg by mouth daily.   Yes [provider]  ?Cholecalciferol (VITAMIN D3) 125 MCG (5000 UT) CAPS Take 5,000 Units by mouth daily.   Yes [provider]  ?clopidogrel (PLAVIX) 75 MG tablet Take 75 mg by mouth daily. 10/02/20  Yes [provider]  ?ezetimibe (ZETIA) 10 MG tablet TAKE 1 TABLET(10 MG) BY MOUTH DAILY 06/24/21  Yes Fayrene Helper, MD  ?fluticasone [facility]) 50 MCG/ACT nasal spray SHAKE LIQUID AND USE 2 SPRAYS IN EACH NOSTRIL DAILY 06/24/21  Yes Fayrene Helper, MD  ?HYDROcodone-acetaminophen (NORCO/VICODIN) 5-325 MG tablet Take 1-2 tablets by mouth every 4 (four) hours as needed for moderate pain. 05/12/20  Yes Persons, Bevely Palmer, PA  ?hydroxychloroquine (PLAQUENIL) 200 MG tablet Take 200 mg by mouth See admin instructions. Take 200 mg twice daily every 3rd day 08/14/19  Yes [provider]  ?metoprolol succinate (TOPROL-XL) 25 MG 24 hr tablet TAKE 1 TABLET(25 MG) BY MOUTH DAILY 09/12/20  Yes Branch, Alphonse Guild, MD  ?montelukast (SINGULAIR) 10 MG tablet TAKE 1 TABLET(10 MG) BY MOUTH AT BEDTIME 06/24/21  Yes Fayrene Helper, MD  ?Multiple Vitamin (MULTIVITAMIN WITH MINERALS) TABS tablet Take 1 tablet by mouth daily.   Yes [provider]  ?oxybutynin (DITROPAN) 5 MG tablet TAKE 1/2 TABLET(2.5 MG) BY MOUTH TWICE DAILY 06/24/21  Yes Fayrene Helper, MD  ?pantoprazole (PROTONIX) 40 MG tablet TAKE 1 TABLET(40 MG) BY MOUTH DAILY 06/24/21  Yes Fayrene Helper, MD  ?potassium chloride SA (KLOR-CON M) 20 MEQ tablet Take 1 tablet (20 mEq total) by mouth daily. 04/28/21  Yes BranchAlphonse Guild, MD  ?rosuvastatin (CRESTOR) 20 MG tablet TAKE 1 TABLET BY MOUTH EVERY MONDAY, WEDNESDAY,  FRIDAY, AND SATURDAY 05/08/20  Yes Branch, Alphonse Guild, MD  ?torsemide (DEMADEX) 20 MG tablet Take 0.5 tablets (10 mg total) by mouth as needed. 05/13/20 08/11/20  Erma Heritage, PA-C  ?UNABLE TO FIND Under pads use as needed  ?Pullups use as needed  ? ?Length of need- 99 months ?DX urinary incontinence 01/02/19   Fayrene Helper, MD  ?UNABLE TO FIND Incontinence briefs and pads ?Dx incontinence 10/02/19   Fayrene Helper, MD  ?   ? ?Allergies    ?Ace inhibitors   ? ?Review of Systems   ?Review of Systems  ?Constitutional:   Negative for fever.  ?Respiratory:  Positive for cough.   ?All other systems reviewed and are negative. ? ?Physical Exam ?Updated Vital Signs ?BP (!) 152/76 (BP Location: Left Arm)   Pulse 89   Temp 99 ?F (37.2 ?C) (Oral)   Resp 16   Ht '5\' 7"'$  (1.702 m)   Wt 74 kg   SpO2 100%   BMI 25.55 kg/m?  ?Physical Exam ?Vitals and nursing note reviewed.  ?Constitutional:   ?   Appearance: She is well-developed.  ?HENT:  ?   Head: Normocephalic.  ?   Right Ear: Tympanic membrane normal.  ?   Left Ear: Tympanic membrane normal.  ?   Nose: Nose normal.  ?Cardiovascular:  ?   Rate and Rhythm: Normal rate.  ?Pulmonary:  ?   Effort: Pulmonary effort is normal.  ?Abdominal:  ?   General: There is no distension.  ?Musculoskeletal:     ?   General: Normal range of motion.  ?   Cervical back: Normal range of motion.  ?Skin: ?   General: Skin is warm.  ?Neurological:  ?   Mental Status: She is alert and oriented to person, place, and time.  ? ? ?ED Results / Procedures / Treatments   ?Labs ?(all labs ordered are listed, but only abnormal results are displayed) ?Labs Reviewed  ?CBC WITH DIFFERENTIAL/PLATELET - Abnormal; Notable for the following components:  ?    Result Value  ? WBC 11.3 (*)   ? RBC 3.77 (*)   ? Hemoglobin 11.0 (*)   ? HCT 35.3 (*)   ? Neutro Abs 8.2 (*)   ? Monocytes Absolute 1.2 (*)   ? All other components within normal limits  ?RESP PANEL BY RT-PCR (FLU A&B, COVID) ARPGX2  ?COMPREHENSIVE METABOLIC PANEL  ?URINALYSIS, ROUTINE W REFLEX MICROSCOPIC  ? ? ?EKG ?None ? ?Radiology ?DG Chest Portable 1 View ? ?Result Date: 07/02/2021 ?CLINICAL DATA:  Cough, shortness of breath EXAM: PORTABLE CHEST 1 VIEW COMPARISON:  06/22/2021 FINDINGS: Cardiac size is within normal limits. There are no signs of pulmonary edema. Linear density is seen in the medial left lower lung fields. There is no pleural effusion or pneumothorax. IMPRESSION: Linear density in the medial left lower lung fields suggests subsegmental atelectasis.  There are no signs of pulmonary edema or focal pulmonary consolidation. Electronically Signed   By: Elmer Picker M.D.   On: 07/02/2021 13:53   ? ?Procedures ?Procedures  ? ? ?Medications Ordered in ED ?Medications - No data to display ? ?ED Course/ Medical Decision Making/ A&P ?  ?                        ?Medical Decision Making ?Pt's sister reports pt has had a cough for the past month.  Pt has been treated with an antibiotic  ? ?Problems Addressed: ?Acute  bronchitis, unspecified organism: acute illness or injury ? ?Amount and/or Complexity of Data Reviewed ?Independent Historian: guardian ?External Data Reviewed: notes. ?   Details: Primary care notes reviewed ?Labs: ordered. Decision-making details documented in ED Course. ?   Details: Labs ordered, reviewed and intepreted ?Radiology: ordered and independent interpretation performed. Decision-making details documented in ED Course. ?   Details: Chest xray shows linear density ? ?Risk ?Prescription drug management. ?Risk Details: Pt has uti and possible bronchitis.  Pt given rx for keflex  ? ? ? ? ? ? ? ? ? ? ?Final Clinical Impression(s) / ED Diagnoses ?Final diagnoses:  ?Urinary tract infection without hematuria, site unspecified  ?Acute bronchitis, unspecified organism  ? ? ?Rx / DC Orders ?ED Discharge Orders   ? ?      Ordered  ?  cephALEXin (KEFLEX) 500 MG capsule  4 times daily       ? 07/02/21 2110  ? ?  ?  ? ?  ? ?An After Visit Summary was printed and given to the patient.  ?  ?Fransico Meadow, PA-C ?07/03/21 0815 ? ?  ?Milton Ferguson, MD ?07/03/21 0915 ? ?

## 2021-07-04 LAB — URINE CULTURE
Culture: NO GROWTH
Special Requests: NORMAL

## 2021-07-05 ENCOUNTER — Encounter (HOSPITAL_COMMUNITY): Payer: Self-pay | Admitting: Emergency Medicine

## 2021-07-05 ENCOUNTER — Other Ambulatory Visit: Payer: Self-pay

## 2021-07-05 ENCOUNTER — Emergency Department (HOSPITAL_COMMUNITY)
Admission: EM | Admit: 2021-07-05 | Discharge: 2021-07-05 | Disposition: A | Discharge status: Home / Self Care | Payer: Medicare Other | Attending: Emergency Medicine | Admitting: Emergency Medicine

## 2021-07-05 ENCOUNTER — Emergency Department (HOSPITAL_COMMUNITY): Payer: Medicare Other

## 2021-07-05 DIAGNOSIS — E860 Dehydration: Secondary | ICD-10-CM | POA: Diagnosis not present

## 2021-07-05 DIAGNOSIS — R1110 Vomiting, unspecified: Secondary | ICD-10-CM | POA: Diagnosis present

## 2021-07-05 DIAGNOSIS — R059 Cough, unspecified: Secondary | ICD-10-CM | POA: Diagnosis not present

## 2021-07-05 DIAGNOSIS — Z79899 Other long term (current) drug therapy: Secondary | ICD-10-CM | POA: Insufficient documentation

## 2021-07-05 DIAGNOSIS — R630 Anorexia: Secondary | ICD-10-CM | POA: Insufficient documentation

## 2021-07-05 DIAGNOSIS — Z7982 Long term (current) use of aspirin: Secondary | ICD-10-CM | POA: Diagnosis not present

## 2021-07-05 DIAGNOSIS — E876 Hypokalemia: Secondary | ICD-10-CM | POA: Diagnosis not present

## 2021-07-05 DIAGNOSIS — R5383 Other fatigue: Secondary | ICD-10-CM | POA: Insufficient documentation

## 2021-07-05 LAB — COMPREHENSIVE METABOLIC PANEL
ALT: 60 U/L — ABNORMAL HIGH (ref 0–44)
AST: 48 U/L — ABNORMAL HIGH (ref 15–41)
Albumin: 2.1 g/dL — ABNORMAL LOW (ref 3.5–5.0)
Alkaline Phosphatase: 110 U/L (ref 38–126)
Anion gap: 13 (ref 5–15)
BUN: 7 mg/dL — ABNORMAL LOW (ref 8–23)
CO2: 21 mmol/L — ABNORMAL LOW (ref 22–32)
Calcium: 8.5 mg/dL — ABNORMAL LOW (ref 8.9–10.3)
Chloride: 103 mmol/L (ref 98–111)
Creatinine, Ser: 1.05 mg/dL — ABNORMAL HIGH (ref 0.44–1.00)
GFR, Estimated: 59 mL/min — ABNORMAL LOW (ref >60)
Glucose, Bld: 120 mg/dL — ABNORMAL HIGH (ref 70–99)
Potassium: 3.1 mmol/L — ABNORMAL LOW (ref 3.5–5.1)
Sodium: 137 mmol/L (ref 135–145)
Total Bilirubin: 0.7 mg/dL (ref 0.3–1.2)
Total Protein: 8.4 g/dL — ABNORMAL HIGH (ref 6.5–8.1)

## 2021-07-05 LAB — URINALYSIS, ROUTINE W REFLEX MICROSCOPIC
Bacteria, UA: NONE SEEN
Bilirubin Urine: NEGATIVE
Glucose, UA: NEGATIVE mg/dL
Hgb urine dipstick: NEGATIVE
Ketones, ur: NEGATIVE mg/dL
Leukocytes,Ua: NEGATIVE
Nitrite: NEGATIVE
Protein, ur: >=300 mg/dL — AB
Specific Gravity, Urine: 1.02 (ref 1.005–1.030)
pH: 6 (ref 5.0–8.0)

## 2021-07-05 LAB — LIPASE, BLOOD: Lipase: 30 U/L (ref 11–51)

## 2021-07-05 LAB — CBC
HCT: 38.1 % (ref 36.0–46.0)
Hemoglobin: 12.3 g/dL (ref 12.0–15.0)
MCH: 30.2 pg (ref 26.0–34.0)
MCHC: 32.3 g/dL (ref 30.0–36.0)
MCV: 93.6 fL (ref 80.0–100.0)
Platelets: 299 10*3/uL (ref 150–400)
RBC: 4.07 MIL/uL (ref 3.87–5.11)
RDW: 15.1 % (ref 11.5–15.5)
WBC: 12.5 10*3/uL — ABNORMAL HIGH (ref 4.0–10.5)
nRBC: 0 % (ref 0.0–0.2)

## 2021-07-05 MED ORDER — METOCLOPRAMIDE HCL 5 MG/ML IJ SOLN
5.0000 mg | Freq: Once | INTRAMUSCULAR | Status: AC
  Administered 2021-07-05: 5 mg via INTRAVENOUS
  Filled 2021-07-05: qty 2

## 2021-07-05 MED ORDER — LACTATED RINGERS IV SOLN: INTRAVENOUS | Status: DC

## 2021-07-05 MED ORDER — LACTATED RINGERS IV BOLUS
2000.0000 mL | Freq: Once | INTRAVENOUS | Status: AC
  Administered 2021-07-05: 2000 mL via INTRAVENOUS

## 2021-07-05 MED ORDER — POTASSIUM CHLORIDE 10 MEQ/100ML IV SOLN
10.0000 meq | Freq: Once | INTRAVENOUS | Status: AC
  Administered 2021-07-05: 10 meq via INTRAVENOUS
  Filled 2021-07-05: qty 100

## 2021-07-05 MED ORDER — POTASSIUM CHLORIDE ER 10 MEQ PO TBCR: 10.0000 meq | EXTENDED_RELEASE_TABLET | Freq: Two times a day (BID) | ORAL | 0 refills | Status: DC

## 2021-07-05 NOTE — ED Triage Notes (Signed)
Patient BIB husband for re-evaluation, see at [facility] on 3/16 and diagnosed with UTI and prescribed abx with note that urine culture is pending. Urine culture result shows no growth, husband states patient has not been able to keep food nor medicine down and vomits every time she tries to swallow either.  ?

## 2021-07-05 NOTE — Assessment & Plan Note (Signed)
1 wek history of notably reduced appetite , ED eval recommended as has other costituitional symptoms ?

## 2021-07-05 NOTE — ED Notes (Signed)
Discharge instructions reviewed with patient an family. Patient and family verbalized understanding of instructions. Follow-up care and medications were reviewed. Patient wheeled out of ED in wheelchair. VSS upon discharge.  ?

## 2021-07-05 NOTE — ED Notes (Signed)
RN attempted 2x IV unsuccessful. ?

## 2021-07-05 NOTE — Assessment & Plan Note (Signed)
1 week history of increased fatigue with persistent cough, needs ED eval ?

## 2021-07-05 NOTE — Assessment & Plan Note (Signed)
4 week h/o cough for which she has been treated with persistence, and development of increased fatigue and poor appetite, at high risk for pneumonia and severe illness based on co morbidities, ED eval recoimmended ?

## 2021-07-05 NOTE — ED Provider Notes (Signed)
?[name] ?Provider Note ? ? ?CSN: [id number] ?Arrival date & time: 07/05/21  1231 ? ?  ? ?History ? ?Chief Complaint  ?Patient presents with  ? Emesis  ? ? ?Danielle Cruz is a 66 y.o. female. ? ?66 year old female presents with emesis times several days.  Recently treated for UTI.  States that she cannot swallow solids but can swallow liquids.  Feels that she is dehydrated.  Endorses diffuse weakness.  Denies any fever or abdominal discomfort.  No current urinary symptoms.  Symptoms unresponsive to her home medications.  Has noted some URI symptoms but denies any severe dyspnea ? ? ?  ? ?Home Medications ?Prior to Admission medications   ?Medication Sig Start Date End Date Taking? Authorizing Provider  ?acetaminophen (TYLENOL) 325 MG tablet Take 650 mg by mouth every 6 (six) hours as needed for mild pain or moderate pain.    [provider]  ?alendronate (FOSAMAX) 70 MG tablet Take 70 mg by mouth once a week. 03/03/21   [provider]  ?amLODipine (NORVASC) 5 MG tablet TAKE 1 TABLET(5 MG) BY MOUTH DAILY 06/24/21   Fayrene Helper, MD  ?Ascorbic Acid (VITAMIN C) 1000 MG tablet Take 1,000 mg by mouth daily.    [provider]  ?aspirin EC 81 MG tablet Take 81 mg by mouth daily. Swallow whole.    [provider]  ?benzonatate (TESSALON) 100 MG capsule Take 1 capsule (100 mg total) by mouth 2 (two) times daily as needed for cough. 06/02/21   Lindell Spar, MD  ?Calcium-Magnesium-Vitamin D (CALCIUM 1200+D3 PO) Take 1 tablet by mouth daily.    [provider]  ?cephALEXin (KEFLEX) 500 MG capsule Take 1 capsule (500 mg total) by mouth 4 (four) times daily for 10 days. 07/02/21 07/12/21  Fransico Meadow, PA-C  ?cetirizine (ZYRTEC) 10 MG tablet Take 10 mg by mouth daily.    [provider]  ?Cholecalciferol (VITAMIN D3) 125 MCG (5000 UT) CAPS Take 5,000 Units by mouth daily.    [provider]  ?clopidogrel (PLAVIX) 75 MG  tablet Take 75 mg by mouth daily. 10/02/20   [provider]  ?ezetimibe (ZETIA) 10 MG tablet TAKE 1 TABLET(10 MG) BY MOUTH DAILY 06/24/21   Fayrene Helper, MD  ?fluticasone [facility]) 50 MCG/ACT nasal spray SHAKE LIQUID AND USE 2 SPRAYS IN [facility] NOSTRIL DAILY 06/24/21   Fayrene Helper, MD  ?HYDROcodone-acetaminophen (NORCO/VICODIN) 5-325 MG tablet Take 1-2 tablets by mouth every 4 (four) hours as needed for moderate pain. 05/12/20   Persons, Bevely Palmer, PA  ?hydroxychloroquine (PLAQUENIL) 200 MG tablet Take 200 mg by mouth See admin instructions. Take 200 mg twice daily every 3rd day 08/14/19   [provider]  ?metoprolol succinate (TOPROL-XL) 25 MG 24 hr tablet TAKE 1 TABLET(25 MG) BY MOUTH DAILY 09/12/20   Arnoldo Lenis, MD  ?montelukast (SINGULAIR) 10 MG tablet TAKE 1 TABLET(10 MG) BY MOUTH AT BEDTIME 06/24/21   Fayrene Helper, MD  ?Multiple Vitamin (MULTIVITAMIN WITH MINERALS) TABS tablet Take 1 tablet by mouth daily.    [provider]  ?oxybutynin (DITROPAN) 5 MG tablet TAKE 1/2 TABLET(2.5 MG) BY MOUTH TWICE DAILY 06/24/21   Fayrene Helper, MD  ?pantoprazole (PROTONIX) 40 MG tablet TAKE 1 TABLET(40 MG) BY MOUTH DAILY 06/24/21   Fayrene Helper, MD  ?potassium chloride SA (KLOR-CON M) 20 MEQ tablet Take 1 tablet (20 mEq total) by mouth daily. 04/28/21   Branch,  Alphonse Guild, MD  ?rosuvastatin (CRESTOR) 20 MG tablet TAKE 1 TABLET BY MOUTH EVERY MONDAY, [place], [place], AND SATURDAY 05/08/20   Arnoldo Lenis, MD  ?torsemide (DEMADEX) 20 MG tablet Take 0.5 tablets (10 mg total) by mouth as needed. 05/13/20 08/11/20  Erma Heritage, PA-C  ?UNABLE TO FIND Under pads use as needed  ?Pullups use as needed  ? ?Length of need- 99 months ?DX urinary incontinence 01/02/19   Fayrene Helper, MD  ?UNABLE TO FIND Incontinence briefs and pads ?Dx incontinence 10/02/19   Fayrene Helper, MD  ?   ? ?Allergies    ?Ace inhibitors   ? ?Review of Systems   ?Review of Systems  ?All  other systems reviewed and are negative. ? ?Physical Exam ?Updated Vital Signs ?BP (!) 143/82 (BP Location: Left Arm)   Pulse 88   Temp 98 ?F (36.7 ?C) (Oral)   Resp 16   SpO2 100%  ?Physical Exam ?Vitals and nursing note reviewed.  ?Constitutional:   ?   General: She is not in acute distress. ?   Appearance: Normal appearance. She is well-developed. She is not toxic-appearing.  ?HENT:  ?   Head: Normocephalic and atraumatic.  ?Eyes:  ?   General: Lids are normal.  ?   Conjunctiva/sclera: Conjunctivae normal.  ?   Pupils: Pupils are equal, round, and reactive to light.  ?Neck:  ?   Thyroid: No thyroid mass.  ?   Trachea: No tracheal deviation.  ?Cardiovascular:  ?   Rate and Rhythm: Normal rate and regular rhythm.  ?   Heart sounds: Normal heart sounds. No murmur heard. ?  No gallop.  ?Pulmonary:  ?   Effort: Pulmonary effort is normal. No respiratory distress.  ?   Breath sounds: Normal breath sounds. No stridor. No decreased breath sounds, wheezing, rhonchi or rales.  ?Abdominal:  ?   General: There is no distension.  ?   Palpations: Abdomen is soft.  ?   Tenderness: There is no abdominal tenderness. There is no rebound.  ?Musculoskeletal:     ?   General: No tenderness. Normal range of motion.  ?   Cervical back: Normal range of motion and neck supple.  ?Skin: ?   General: Skin is warm and dry.  ?   Findings: No abrasion or rash.  ?Neurological:  ?   Mental Status: She is alert and oriented to person, place, and time. Mental status is at baseline.  ?   GCS: GCS eye subscore is 4. GCS verbal subscore is 5. GCS motor subscore is 6.  ?   Cranial Nerves: No cranial nerve deficit.  ?   Sensory: No sensory deficit.  ?   Motor: Motor function is intact.  ?Psychiatric:     ?   Attention and Perception: Attention normal.     ?   Speech: Speech normal.     ?   Behavior: Behavior normal.  ? ? ?ED Results / Procedures / Treatments   ?Labs ?(all labs ordered are listed, but only abnormal results are displayed) ?Labs  Reviewed  ?COMPREHENSIVE METABOLIC PANEL - Abnormal; Notable for the following components:  ?    Result Value  ? Potassium 3.1 (*)   ? CO2 21 (*)   ? Glucose, Bld 120 (*)   ? BUN 7 (*)   ? Creatinine, Ser 1.05 (*)   ? Calcium 8.5 (*)   ? Total Protein 8.4 (*)   ? Albumin 2.1 (*)   ?  AST 48 (*)   ? ALT 60 (*)   ? GFR, Estimated 59 (*)   ? All other components within normal limits  ?CBC - Abnormal; Notable for the following components:  ? WBC 12.5 (*)   ? All other components within normal limits  ?RESP PANEL BY RT-PCR (FLU A&B, COVID) ARPGX2  ?LIPASE, BLOOD  ?URINALYSIS, ROUTINE W REFLEX MICROSCOPIC  ? ? ?EKG ?None ? ?Radiology ?No results found. ? ?Procedures ?Procedures  ? ? ?Medications Ordered in ED ?Medications  ?lactated ringers infusion (has no administration in time range)  ?lactated ringers bolus 2,000 mL (has no administration in time range)  ?metoCLOPramide (REGLAN) injection 5 mg (has no administration in time range)  ? ? ?ED Course/ Medical Decision Making/ A&P ?  ?                        ?Medical Decision Making ?Amount and/or Complexity of Data Reviewed ?Labs: ordered. ?Radiology: ordered. ? ?Risk ?Prescription drug management. ? ? ?Patient presents with emesis times several days.  No abdominal discomfort.  Do not feel that she requires any abdominal imaging at this time.  Patient clinically looks dehydrated was given 2 L of IV lactated Ringer's and feels much better.  Mild hypokalemia noted and was given piggyback potassium.  Patient having complaint of URI symptoms and chest x-ray per my interpretation without acute findings.  Urinalysis negative.  Patient reassessed and is able to take oral liquids at this time.  Will be discharged home ? ? ? ? ? ? ? ?Final Clinical Impression(s) / ED Diagnoses ?Final diagnoses:  ?None  ? ? ?Rx / DC Orders ?ED Discharge Orders   ? ? None  ? ?  ? ? ?  ?Lacretia Leigh, MD ?07/05/21 2136 ? ?

## 2021-07-05 NOTE — ED Notes (Signed)
RN and NT adjusted pt in bed. Provided pt pillow and propped right arm up.  ?

## 2021-07-05 NOTE — ED Notes (Addendum)
Pt husband stated pt appetite has decreased in the last few days. Pt is unable to hold anything down. Pt had started antibiotics a few days ago for an UTI. Pt didn't finish the course of abx d/t vomiting.  ? ?Pt family at the bedside help communicate the needs of pt.  ?

## 2021-07-08 ENCOUNTER — Telehealth: Payer: Self-pay | Admitting: Family Medicine

## 2021-07-08 NOTE — Telephone Encounter (Signed)
Sister [name] called in requesting nebulizer for patient . ? ? ?Sister would like a call back in regard. ?

## 2021-07-08 NOTE — Telephone Encounter (Signed)
Sister made aware she's not on the dpr only the patients spouse  ?

## 2021-07-10 ENCOUNTER — Inpatient Hospital Stay (HOSPITAL_COMMUNITY)
Admission: EM | Admit: 2021-07-10 | Discharge: 2021-07-15 | DRG: 375 | Disposition: A | Discharge status: Hospice | Payer: Medicare Other | Attending: Internal Medicine | Admitting: Internal Medicine

## 2021-07-10 ENCOUNTER — Emergency Department (HOSPITAL_COMMUNITY): Payer: Medicare Other

## 2021-07-10 ENCOUNTER — Other Ambulatory Visit: Payer: Self-pay

## 2021-07-10 ENCOUNTER — Encounter (HOSPITAL_COMMUNITY): Payer: Self-pay | Admitting: Emergency Medicine

## 2021-07-10 DIAGNOSIS — Z807 Family history of other malignant neoplasms of lymphoid, hematopoietic and related tissues: Secondary | ICD-10-CM | POA: Diagnosis not present

## 2021-07-10 DIAGNOSIS — Z96649 Presence of unspecified artificial hip joint: Secondary | ICD-10-CM | POA: Diagnosis present

## 2021-07-10 DIAGNOSIS — I4891 Unspecified atrial fibrillation: Secondary | ICD-10-CM | POA: Diagnosis not present

## 2021-07-10 DIAGNOSIS — Z87891 Personal history of nicotine dependence: Secondary | ICD-10-CM

## 2021-07-10 DIAGNOSIS — R222 Localized swelling, mass and lump, trunk: Secondary | ICD-10-CM | POA: Diagnosis present

## 2021-07-10 DIAGNOSIS — R64 Cachexia: Secondary | ICD-10-CM | POA: Diagnosis not present

## 2021-07-10 DIAGNOSIS — I110 Hypertensive heart disease with heart failure: Secondary | ICD-10-CM | POA: Diagnosis not present

## 2021-07-10 DIAGNOSIS — Z8249 Family history of ischemic heart disease and other diseases of the circulatory system: Secondary | ICD-10-CM

## 2021-07-10 DIAGNOSIS — Z800 Family history of malignant neoplasm of digestive organs: Secondary | ICD-10-CM

## 2021-07-10 DIAGNOSIS — R1312 Dysphagia, oropharyngeal phase: Secondary | ICD-10-CM | POA: Diagnosis not present

## 2021-07-10 DIAGNOSIS — R531 Weakness: Secondary | ICD-10-CM | POA: Diagnosis not present

## 2021-07-10 DIAGNOSIS — I739 Peripheral vascular disease, unspecified: Secondary | ICD-10-CM | POA: Diagnosis not present

## 2021-07-10 DIAGNOSIS — R229 Localized swelling, mass and lump, unspecified: Secondary | ICD-10-CM

## 2021-07-10 DIAGNOSIS — Z66 Do not resuscitate: Secondary | ICD-10-CM | POA: Diagnosis not present

## 2021-07-10 DIAGNOSIS — Z20822 Contact with and (suspected) exposure to covid-19: Secondary | ICD-10-CM | POA: Diagnosis not present

## 2021-07-10 DIAGNOSIS — I69320 Aphasia following cerebral infarction: Secondary | ICD-10-CM

## 2021-07-10 DIAGNOSIS — D72829 Elevated white blood cell count, unspecified: Secondary | ICD-10-CM | POA: Diagnosis present

## 2021-07-10 DIAGNOSIS — R627 Adult failure to thrive: Secondary | ICD-10-CM | POA: Diagnosis not present

## 2021-07-10 DIAGNOSIS — Z743 Need for continuous supervision: Secondary | ICD-10-CM | POA: Diagnosis not present

## 2021-07-10 DIAGNOSIS — E785 Hyperlipidemia, unspecified: Secondary | ICD-10-CM | POA: Diagnosis not present

## 2021-07-10 DIAGNOSIS — Z803 Family history of malignant neoplasm of breast: Secondary | ICD-10-CM

## 2021-07-10 DIAGNOSIS — Z720 Tobacco use: Secondary | ICD-10-CM | POA: Diagnosis not present

## 2021-07-10 DIAGNOSIS — C155 Malignant neoplasm of lower third of esophagus: Secondary | ICD-10-CM | POA: Diagnosis not present

## 2021-07-10 DIAGNOSIS — I70201 Unspecified atherosclerosis of native arteries of extremities, right leg: Secondary | ICD-10-CM | POA: Diagnosis present

## 2021-07-10 DIAGNOSIS — Z955 Presence of coronary angioplasty implant and graft: Secondary | ICD-10-CM

## 2021-07-10 DIAGNOSIS — Z7902 Long term (current) use of antithrombotics/antiplatelets: Secondary | ICD-10-CM

## 2021-07-10 DIAGNOSIS — Z8673 Personal history of transient ischemic attack (TIA), and cerebral infarction without residual deficits: Secondary | ICD-10-CM | POA: Diagnosis not present

## 2021-07-10 DIAGNOSIS — R1310 Dysphagia, unspecified: Secondary | ICD-10-CM | POA: Diagnosis not present

## 2021-07-10 DIAGNOSIS — I6523 Occlusion and stenosis of bilateral carotid arteries: Secondary | ICD-10-CM | POA: Diagnosis not present

## 2021-07-10 DIAGNOSIS — M069 Rheumatoid arthritis, unspecified: Secondary | ICD-10-CM | POA: Diagnosis not present

## 2021-07-10 DIAGNOSIS — K5730 Diverticulosis of large intestine without perforation or abscess without bleeding: Secondary | ICD-10-CM | POA: Diagnosis not present

## 2021-07-10 DIAGNOSIS — J9811 Atelectasis: Secondary | ICD-10-CM | POA: Diagnosis not present

## 2021-07-10 DIAGNOSIS — M810 Age-related osteoporosis without current pathological fracture: Secondary | ICD-10-CM | POA: Diagnosis not present

## 2021-07-10 DIAGNOSIS — Z515 Encounter for palliative care: Secondary | ICD-10-CM | POA: Diagnosis not present

## 2021-07-10 DIAGNOSIS — C7951 Secondary malignant neoplasm of bone: Secondary | ICD-10-CM | POA: Diagnosis present

## 2021-07-10 DIAGNOSIS — R9431 Abnormal electrocardiogram [ECG] [EKG]: Secondary | ICD-10-CM | POA: Diagnosis not present

## 2021-07-10 DIAGNOSIS — R079 Chest pain, unspecified: Secondary | ICD-10-CM | POA: Diagnosis not present

## 2021-07-10 DIAGNOSIS — Z6822 Body mass index (BMI) 22.0-22.9, adult: Secondary | ICD-10-CM

## 2021-07-10 DIAGNOSIS — Z7982 Long term (current) use of aspirin: Secondary | ICD-10-CM

## 2021-07-10 DIAGNOSIS — C787 Secondary malignant neoplasm of liver and intrahepatic bile duct: Secondary | ICD-10-CM | POA: Diagnosis present

## 2021-07-10 DIAGNOSIS — I5032 Chronic diastolic (congestive) heart failure: Secondary | ICD-10-CM | POA: Diagnosis present

## 2021-07-10 DIAGNOSIS — Z89611 Acquired absence of right leg above knee: Secondary | ICD-10-CM

## 2021-07-10 DIAGNOSIS — Z87440 Personal history of urinary (tract) infections: Secondary | ICD-10-CM

## 2021-07-10 DIAGNOSIS — R590 Localized enlarged lymph nodes: Secondary | ICD-10-CM | POA: Diagnosis not present

## 2021-07-10 DIAGNOSIS — Z7189 Other specified counseling: Secondary | ICD-10-CM | POA: Diagnosis not present

## 2021-07-10 DIAGNOSIS — Z7983 Long term (current) use of bisphosphonates: Secondary | ICD-10-CM

## 2021-07-10 DIAGNOSIS — R0602 Shortness of breath: Secondary | ICD-10-CM | POA: Diagnosis not present

## 2021-07-10 DIAGNOSIS — K2289 Other specified disease of esophagus: Secondary | ICD-10-CM | POA: Diagnosis not present

## 2021-07-10 DIAGNOSIS — E876 Hypokalemia: Secondary | ICD-10-CM | POA: Diagnosis present

## 2021-07-10 DIAGNOSIS — R2233 Localized swelling, mass and lump, upper limb, bilateral: Secondary | ICD-10-CM | POA: Diagnosis present

## 2021-07-10 DIAGNOSIS — Z86010 Personal history of colonic polyps: Secondary | ICD-10-CM

## 2021-07-10 DIAGNOSIS — I69331 Monoplegia of upper limb following cerebral infarction affecting right dominant side: Secondary | ICD-10-CM | POA: Diagnosis not present

## 2021-07-10 DIAGNOSIS — Z888 Allergy status to other drugs, medicaments and biological substances status: Secondary | ICD-10-CM

## 2021-07-10 DIAGNOSIS — R059 Cough, unspecified: Secondary | ICD-10-CM | POA: Diagnosis not present

## 2021-07-10 LAB — BASIC METABOLIC PANEL
Anion gap: 9 (ref 5–15)
BUN: 11 mg/dL (ref 8–23)
CO2: 23 mmol/L (ref 22–32)
Calcium: 8.6 mg/dL — ABNORMAL LOW (ref 8.9–10.3)
Chloride: 104 mmol/L (ref 98–111)
Creatinine, Ser: 0.89 mg/dL (ref 0.44–1.00)
GFR, Estimated: >60 mL/min (ref >60)
Glucose, Bld: 104 mg/dL — ABNORMAL HIGH (ref 70–99)
Potassium: 3 mmol/L — ABNORMAL LOW (ref 3.5–5.1)
Sodium: 136 mmol/L (ref 135–145)

## 2021-07-10 LAB — CBC
HCT: 36.1 % (ref 36.0–46.0)
Hemoglobin: 11.3 g/dL — ABNORMAL LOW (ref 12.0–15.0)
MCH: 28.5 pg (ref 26.0–34.0)
MCHC: 31.3 g/dL (ref 30.0–36.0)
MCV: 90.9 fL (ref 80.0–100.0)
Platelets: 291 10*3/uL (ref 150–400)
RBC: 3.97 MIL/uL (ref 3.87–5.11)
RDW: 14.9 % (ref 11.5–15.5)
WBC: 13.3 10*3/uL — ABNORMAL HIGH (ref 4.0–10.5)
nRBC: 0 % (ref 0.0–0.2)

## 2021-07-10 LAB — RESP PANEL BY RT-PCR (FLU A&B, COVID) ARPGX2
Influenza A by PCR: NEGATIVE
Influenza B by PCR: NEGATIVE
SARS Coronavirus 2 by RT PCR: NEGATIVE

## 2021-07-10 LAB — MAGNESIUM: Magnesium: 1.9 mg/dL (ref 1.7–2.4)

## 2021-07-10 MED ORDER — POTASSIUM CHLORIDE 10 MEQ/100ML IV SOLN
10.0000 meq | INTRAVENOUS | Status: AC
  Administered 2021-07-10 – 2021-07-11 (×3): 10 meq via INTRAVENOUS
  Filled 2021-07-10 (×3): qty 100

## 2021-07-10 MED ORDER — IOHEXOL 300 MG/ML  SOLN
75.0000 mL | Freq: Once | INTRAMUSCULAR | Status: AC | PRN
  Administered 2021-07-10: 75 mL via INTRAVENOUS

## 2021-07-10 MED ORDER — SODIUM CHLORIDE 0.9 % IV BOLUS
500.0000 mL | Freq: Once | INTRAVENOUS | Status: AC
  Administered 2021-07-10: 500 mL via INTRAVENOUS

## 2021-07-10 MED ORDER — IOHEXOL 300 MG/ML  SOLN
100.0000 mL | Freq: Once | INTRAMUSCULAR | Status: DC | PRN
Start: 2021-07-10 — End: 2021-07-14

## 2021-07-10 NOTE — ED Provider Notes (Signed)
?[facility] ?Provider Note ? ? ?CSN: [id number] ?Arrival date & time: 07/10/21  1704 ? ?  ? ?History ? ?Chief Complaint  ?Patient presents with  ? Weakness  ? ? ?Danielle Cruz is a 66 y.o. female. ? ?HPI ?As for evaluation of cough, difficulty swallowing, unable to take usual medicines and worsening condition for the last several weeks.  She has been treated for bronchitis during this time.  She is taking antibiotics without improvement.  She is nonverbal but is able to communicate, family member with her helps to give history.  Family member states that she is noticed some nodules on the patient's body when they are helping her Joesph July out.  These are present on both arms, her anterior and posterior torso ?  ? ?Home Medications ?Prior to Admission medications   ?Medication Sig Start Date End Date Taking? Authorizing Provider  ?alendronate (FOSAMAX) 70 MG tablet Take 70 mg by mouth once a week. 03/03/21   [provider]  ?amLODipine (NORVASC) 5 MG tablet TAKE 1 TABLET(5 MG) BY MOUTH DAILY 06/24/21   Fayrene Helper, MD  ?Ascorbic Acid (VITAMIN C) 1000 MG tablet Take 1,000 mg by mouth daily.    [provider]  ?aspirin EC 81 MG tablet Take 81 mg by mouth daily. Swallow whole.    [provider]  ?benzonatate (TESSALON) 100 MG capsule Take 1 capsule (100 mg total) by mouth 2 (two) times daily as needed for cough. 06/02/21   Lindell Spar, MD  ?Calcium-Magnesium-Vitamin D (CALCIUM 1200+D3 PO) Take 1 tablet by mouth daily.    [provider]  ?cephALEXin (KEFLEX) 500 MG capsule Take 1 capsule (500 mg total) by mouth 4 (four) times daily for 10 days. 07/02/21 07/12/21  Fransico Meadow, PA-C  ?cetirizine (ZYRTEC) 10 MG tablet Take 10 mg by mouth daily as needed for allergies.    [provider]  ?Cholecalciferol (VITAMIN D3) 125 MCG (5000 UT) CAPS Take 5,000 Units by mouth daily.    [provider]  ?clopidogrel (PLAVIX) 75 MG tablet Take 75 mg by  mouth daily. 10/02/20   [provider]  ?ezetimibe (ZETIA) 10 MG tablet TAKE 1 TABLET(10 MG) BY MOUTH DAILY 06/24/21   Fayrene Helper, MD  ?fluticasone (FLONASE) 50 MCG/ACT nasal spray SHAKE LIQUID AND USE 2 SPRAYS IN EACH NOSTRIL DAILY ?Patient taking differently: 2 sprays daily as needed for allergies. 06/24/21   Fayrene Helper, MD  ?HYDROcodone-acetaminophen (NORCO/VICODIN) 5-325 MG tablet Take 1-2 tablets by mouth every 4 (four) hours as needed for moderate pain. 05/12/20   Persons, Bevely Palmer, PA  ?hydroxychloroquine (PLAQUENIL) 200 MG tablet Take 200 mg by mouth See admin instructions. Take 200 mg twice daily every 3rd day 08/14/19   [provider]  ?metoprolol succinate (TOPROL-XL) 25 MG 24 hr tablet TAKE 1 TABLET(25 MG) BY MOUTH DAILY 09/12/20   Arnoldo Lenis, MD  ?montelukast (SINGULAIR) 10 MG tablet TAKE 1 TABLET(10 MG) BY MOUTH AT BEDTIME 06/24/21   Fayrene Helper, MD  ?Multiple Vitamin (MULTIVITAMIN WITH MINERALS) TABS tablet Take 1 tablet by mouth daily.    [provider]  ?oxybutynin (DITROPAN) 5 MG tablet TAKE 1/2 TABLET(2.5 MG) BY MOUTH TWICE DAILY 06/24/21   Fayrene Helper, MD  ?pantoprazole (PROTONIX) 40 MG tablet TAKE 1 TABLET(40 MG) BY MOUTH DAILY 06/24/21   Fayrene Helper, MD  ?potassium chloride SA (KLOR-CON M) 20 MEQ tablet Take 1 tablet (20 mEq total) by mouth  daily. 04/28/21   Arnoldo Lenis, MD  ?rosuvastatin (CRESTOR) 20 MG tablet TAKE 1 TABLET BY MOUTH EVERY MONDAY, WEDNESDAY, FRIDAY, AND SATURDAY 05/08/20   Arnoldo Lenis, MD  ?torsemide (DEMADEX) 20 MG tablet Take 0.5 tablets (10 mg total) by mouth as needed. 05/13/20 08/11/20  Erma Heritage, PA-C  ?UNABLE TO FIND Under pads use as needed  ?Pullups use as needed  ? ?Length of need- 99 months ?DX urinary incontinence 01/02/19   Fayrene Helper, MD  ?UNABLE TO FIND Incontinence briefs and pads ?Dx incontinence 10/02/19   Fayrene Helper, MD  ?   ? ?Allergies    ?Ace inhibitors    ? ?Review of Systems   ?Review of Systems ? ?Physical Exam ?Updated Vital Signs ?BP (!) 145/64 (BP Location: Left Arm)   Pulse 97   Temp 98.4 ?F (36.9 ?C) (Oral)   Resp (!) 28   Ht '5\' 7"'$  (1.702 m)   Wt 65 kg   SpO2 95%   BMI 22.44 kg/m?  ?Physical Exam ?Vitals and nursing note reviewed.  ?Constitutional:   ?   General: She is not in acute distress. ?   Appearance: She is well-developed. She is not ill-appearing, toxic-appearing or diaphoretic.  ?HENT:  ?   Head: Normocephalic and atraumatic.  ?   Right Ear: External ear normal.  ?   Left Ear: External ear normal.  ?   Mouth/Throat:  ?   Mouth: Mucous membranes are moist.  ?   Comments: Inadvertent drooling noted during exam.  Tongue appears either swollen or elevated.  No palpable associated sublingual mass or tenderness.  Posterior pharynx is not visualized due to discomfort with application of tongue blade. ?Eyes:  ?   Conjunctiva/sclera: Conjunctivae normal.  ?   Pupils: Pupils are equal, round, and reactive to light.  ?Neck:  ?   Trachea: Phonation normal.  ?Cardiovascular:  ?   Rate and Rhythm: Normal rate and regular rhythm.  ?   Heart sounds: Normal heart sounds.  ?Pulmonary:  ?   Effort: Pulmonary effort is normal. No respiratory distress.  ?   Breath sounds: No stridor. Rhonchi present.  ?   Comments: Congested cough during exam, nonproductive. ?Abdominal:  ?   Palpations: Abdomen is soft.  ?   Tenderness: There is no abdominal tenderness.  ?Musculoskeletal:     ?   General: Normal range of motion.  ?   Cervical back: Normal range of motion and neck supple.  ?Skin: ?   General: Skin is warm and dry.  ?   Comments: She has scattered subcutaneous nodules measuring about 7 to 8 mm, noted left upper chest, left arm, anterior torso.  Skin is slightly discolored over these, more grayish than red.  The nodules are slightly tender.  Their appearance is nonspecific.  ?Neurological:  ?   Mental Status: She is alert.  ?   Cranial Nerves: No cranial nerve  deficit.  ?   Motor: No abnormal muscle tone.  ?   Coordination: Coordination normal.  ?Psychiatric:     ?   Mood and Affect: Mood normal.     ?   Behavior: Behavior normal.  ? ? ?ED Results / Procedures / Treatments   ?Labs ?(all labs ordered are listed, but only abnormal results are displayed) ?Labs Reviewed  ?CBC - Abnormal; Notable for the following components:  ?    Result Value  ? WBC 13.3 (*)   ? Hemoglobin 11.3 (*)   ?  All other components within normal limits  ?BASIC METABOLIC PANEL - Abnormal; Notable for the following components:  ? Potassium 3.0 (*)   ? Glucose, Bld 104 (*)   ? Calcium 8.6 (*)   ? All other components within normal limits  ?RESP PANEL BY RT-PCR (FLU A&B, COVID) [id number]  ?MAGNESIUM  ? ? ?EKG ?EKG Interpretation ? ?Date/Time:  Friday July 10 2021 19:44:56 EDT ?Ventricular Rate:  140 ?PR Interval:  175 ?QRS Duration: 106 ?QT Interval:  328 ?QTC Calculation: 473 ?R Axis:   -75 ?Text Interpretation: Atrial fibrillation Ventricular premature complex LAD, consider left anterior fascicular block Nonspecific repol abnormality, diffuse leads Since last tracing rate faster and now in atrial fibrillation Confirmed by Daleen Bo [id number]) on 07/10/2021 10:15:51 PM ? ?Radiology ?DG Chest 2 View ? ?Result Date: 07/10/2021 ?CLINICAL DATA:  Shortness of breath EXAM: CHEST - 2 VIEW COMPARISON:  07/05/2021 FINDINGS: The heart size and mediastinal contours are within normal limits. Both lungs are clear. Slight inferior migration of the right humeral head. Aortic atherosclerosis. IMPRESSION: No active cardiopulmonary disease. Electronically Signed   By: Donavan Foil M.D.   On: 07/10/2021 18:29  ? ?CT Soft Tissue Neck W Contrast ? ?Result Date: 07/10/2021 ?CLINICAL DATA:  Dysphagia. Weakness and cough. History of stroke with right-sided deficits EXAM: CT NECK WITH CONTRAST TECHNIQUE: Multidetector CT imaging of the neck was performed using the standard protocol following the bolus administration of  intravenous contrast. RADIATION DOSE REDUCTION: This exam was performed according to the departmental dose-optimization program which includes automated exposure control, adjustment of the mA and/or kV according to patient

## 2021-07-10 NOTE — ED Notes (Signed)
Pt repositioned on side  ?

## 2021-07-10 NOTE — Telephone Encounter (Signed)
Called pt no answer °

## 2021-07-10 NOTE — ED Triage Notes (Signed)
Pt arrives via EMS from home with reports of weakness and cough. Pt is nonverbal and hx of stroke with right sided deficits. Pt with productive cough. ?

## 2021-07-10 NOTE — Telephone Encounter (Signed)
Spoke with [name] he states he will take her to ED when he gets out of his appt.  I told him that we would let the ED docs know they were coming. ?

## 2021-07-10 NOTE — ED Notes (Signed)
Patient transported to X-ray 

## 2021-07-10 NOTE — Telephone Encounter (Signed)
Pts husband said he is very concerned about his wife.  She is not eating well at all, she has a lot of phlegm that is real thick hazzy looking and slimy. Danielle Cruz states she is almost chocking on this phlegm.  She still has a bad cough and she is going down hill fast.  She still has wheezing in her chest as well. ?

## 2021-07-11 ENCOUNTER — Encounter (HOSPITAL_COMMUNITY)
Admission: EM | Disposition: A | Discharge status: Hospice | Payer: Self-pay | Source: Home / Self Care | Attending: Internal Medicine

## 2021-07-11 ENCOUNTER — Encounter (HOSPITAL_COMMUNITY): Payer: Self-pay | Admitting: Internal Medicine

## 2021-07-11 DIAGNOSIS — R229 Localized swelling, mass and lump, unspecified: Secondary | ICD-10-CM

## 2021-07-11 DIAGNOSIS — R9431 Abnormal electrocardiogram [ECG] [EKG]: Secondary | ICD-10-CM

## 2021-07-11 DIAGNOSIS — C7951 Secondary malignant neoplasm of bone: Secondary | ICD-10-CM | POA: Diagnosis not present

## 2021-07-11 DIAGNOSIS — Z7189 Other specified counseling: Secondary | ICD-10-CM | POA: Diagnosis not present

## 2021-07-11 DIAGNOSIS — Z720 Tobacco use: Secondary | ICD-10-CM

## 2021-07-11 DIAGNOSIS — E876 Hypokalemia: Secondary | ICD-10-CM

## 2021-07-11 DIAGNOSIS — Z20822 Contact with and (suspected) exposure to covid-19: Secondary | ICD-10-CM | POA: Diagnosis not present

## 2021-07-11 DIAGNOSIS — C787 Secondary malignant neoplasm of liver and intrahepatic bile duct: Secondary | ICD-10-CM | POA: Diagnosis not present

## 2021-07-11 DIAGNOSIS — I5032 Chronic diastolic (congestive) heart failure: Secondary | ICD-10-CM | POA: Diagnosis not present

## 2021-07-11 DIAGNOSIS — R64 Cachexia: Secondary | ICD-10-CM | POA: Diagnosis not present

## 2021-07-11 DIAGNOSIS — Z7401 Bed confinement status: Secondary | ICD-10-CM | POA: Diagnosis not present

## 2021-07-11 DIAGNOSIS — K2289 Other specified disease of esophagus: Secondary | ICD-10-CM | POA: Diagnosis not present

## 2021-07-11 DIAGNOSIS — R112 Nausea with vomiting, unspecified: Secondary | ICD-10-CM | POA: Diagnosis not present

## 2021-07-11 DIAGNOSIS — R627 Adult failure to thrive: Secondary | ICD-10-CM | POA: Diagnosis not present

## 2021-07-11 DIAGNOSIS — Z807 Family history of other malignant neoplasms of lymphoid, hematopoietic and related tissues: Secondary | ICD-10-CM | POA: Diagnosis not present

## 2021-07-11 DIAGNOSIS — Z8673 Personal history of transient ischemic attack (TIA), and cerebral infarction without residual deficits: Secondary | ICD-10-CM | POA: Diagnosis not present

## 2021-07-11 DIAGNOSIS — I739 Peripheral vascular disease, unspecified: Secondary | ICD-10-CM

## 2021-07-11 DIAGNOSIS — C155 Malignant neoplasm of lower third of esophagus: Secondary | ICD-10-CM | POA: Diagnosis not present

## 2021-07-11 DIAGNOSIS — D72829 Elevated white blood cell count, unspecified: Secondary | ICD-10-CM | POA: Diagnosis not present

## 2021-07-11 DIAGNOSIS — R222 Localized swelling, mass and lump, trunk: Secondary | ICD-10-CM | POA: Diagnosis not present

## 2021-07-11 DIAGNOSIS — R1312 Dysphagia, oropharyngeal phase: Secondary | ICD-10-CM | POA: Diagnosis not present

## 2021-07-11 DIAGNOSIS — M810 Age-related osteoporosis without current pathological fracture: Secondary | ICD-10-CM | POA: Diagnosis not present

## 2021-07-11 DIAGNOSIS — R1310 Dysphagia, unspecified: Secondary | ICD-10-CM | POA: Diagnosis not present

## 2021-07-11 DIAGNOSIS — I70201 Unspecified atherosclerosis of native arteries of extremities, right leg: Secondary | ICD-10-CM | POA: Diagnosis not present

## 2021-07-11 DIAGNOSIS — Z800 Family history of malignant neoplasm of digestive organs: Secondary | ICD-10-CM | POA: Diagnosis not present

## 2021-07-11 DIAGNOSIS — Z515 Encounter for palliative care: Secondary | ICD-10-CM | POA: Diagnosis not present

## 2021-07-11 DIAGNOSIS — R531 Weakness: Secondary | ICD-10-CM | POA: Diagnosis not present

## 2021-07-11 DIAGNOSIS — I110 Hypertensive heart disease with heart failure: Secondary | ICD-10-CM | POA: Diagnosis not present

## 2021-07-11 DIAGNOSIS — Z66 Do not resuscitate: Secondary | ICD-10-CM | POA: Diagnosis not present

## 2021-07-11 DIAGNOSIS — R933 Abnormal findings on diagnostic imaging of other parts of digestive tract: Secondary | ICD-10-CM | POA: Diagnosis not present

## 2021-07-11 DIAGNOSIS — I69331 Monoplegia of upper limb following cerebral infarction affecting right dominant side: Secondary | ICD-10-CM | POA: Diagnosis not present

## 2021-07-11 DIAGNOSIS — R590 Localized enlarged lymph nodes: Secondary | ICD-10-CM | POA: Diagnosis not present

## 2021-07-11 DIAGNOSIS — I4891 Unspecified atrial fibrillation: Secondary | ICD-10-CM | POA: Diagnosis not present

## 2021-07-11 DIAGNOSIS — Z803 Family history of malignant neoplasm of breast: Secondary | ICD-10-CM | POA: Diagnosis not present

## 2021-07-11 DIAGNOSIS — M069 Rheumatoid arthritis, unspecified: Secondary | ICD-10-CM | POA: Diagnosis not present

## 2021-07-11 DIAGNOSIS — E785 Hyperlipidemia, unspecified: Secondary | ICD-10-CM | POA: Diagnosis not present

## 2021-07-11 HISTORY — DX: Malignant neoplasm of lower third of esophagus: C15.5

## 2021-07-11 LAB — COMPREHENSIVE METABOLIC PANEL
ALT: 67 U/L — ABNORMAL HIGH (ref 0–44)
AST: 61 U/L — ABNORMAL HIGH (ref 15–41)
Albumin: 2 g/dL — ABNORMAL LOW (ref 3.5–5.0)
Alkaline Phosphatase: 109 U/L (ref 38–126)
Anion gap: 9 (ref 5–15)
BUN: 11 mg/dL (ref 8–23)
CO2: 18 mmol/L — ABNORMAL LOW (ref 22–32)
Calcium: 8.2 mg/dL — ABNORMAL LOW (ref 8.9–10.3)
Chloride: 110 mmol/L (ref 98–111)
Creatinine, Ser: 0.77 mg/dL (ref 0.44–1.00)
GFR, Estimated: >60 mL/min (ref >60)
Glucose, Bld: 95 mg/dL (ref 70–99)
Potassium: 3.4 mmol/L — ABNORMAL LOW (ref 3.5–5.1)
Sodium: 137 mmol/L (ref 135–145)
Total Bilirubin: 0.6 mg/dL (ref 0.3–1.2)
Total Protein: 7.1 g/dL (ref 6.5–8.1)

## 2021-07-11 LAB — APTT: aPTT: 34 seconds (ref 24–36)

## 2021-07-11 LAB — HIV ANTIBODY (ROUTINE TESTING W REFLEX): HIV Screen 4th Generation wRfx: NONREACTIVE

## 2021-07-11 LAB — PHOSPHORUS: Phosphorus: 3.7 mg/dL (ref 2.5–4.6)

## 2021-07-11 SURGERY — ESOPHAGOGASTRODUODENOSCOPY (EGD) WITH PROPOFOL
Anesthesia: General

## 2021-07-11 MED ORDER — POTASSIUM CHLORIDE 10 MEQ/100ML IV SOLN
10.0000 meq | INTRAVENOUS | Status: AC
  Administered 2021-07-11 (×4): 10 meq via INTRAVENOUS
  Filled 2021-07-11 (×4): qty 100

## 2021-07-11 MED ORDER — SODIUM CHLORIDE 0.9 % IV SOLN: INTRAVENOUS | Status: DC

## 2021-07-11 MED ORDER — ENOXAPARIN SODIUM 40 MG/0.4ML IJ SOSY
40.0000 mg | PREFILLED_SYRINGE | Freq: Every day | INTRAMUSCULAR | Status: DC
  Administered 2021-07-11 – 2021-07-12 (×2): 40 mg via SUBCUTANEOUS
  Filled 2021-07-11 (×2): qty 0.4

## 2021-07-11 NOTE — Assessment & Plan Note (Deleted)
Patient stopped smoking about 4 weeks ago ?

## 2021-07-11 NOTE — Progress Notes (Signed)
NEW ADMISSION NOTE ?New Admission Note:  ? ?Arrival Method: stretcher ?Mental Orientation: A&OX1 NONVERBAL ?Telemetry: 5M11 ?Assessment: Completed ?Skin: INTACT ?IV:LAC ?Pain:0/10 ?Tubes:NONE ?Safety Measures: Safety Fall Prevention Plan has been given, discussed and signed ?Admission: Completed ?[facility] Orientation: Patient has been orientated to the room, unit and staff.  ?Family:none at bedside ? ?Orders have been reviewed and implemented. Will continue to monitor the patient. Call light has been placed within reach and bed alarm has been activated.  ? ?Anaisabel Pederson S Leylany Nored, RN   ?

## 2021-07-11 NOTE — Assessment & Plan Note (Addendum)
-  transition to comfort ?

## 2021-07-11 NOTE — Assessment & Plan Note (Addendum)
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 ?

## 2021-07-11 NOTE — Assessment & Plan Note (Addendum)
-  repleted K and Mg ? ?

## 2021-07-11 NOTE — Progress Notes (Signed)
Per HPI: ? Danielle Cruz is a 66 y.o. female with medical history significant of rheumatoid arthritis, PVD, CAD, prior stroke with RUE weakness, right AKA, hypertension, hyperlipidemia who presents to the emergency department accompanied by sister due to 4-week onset of poor oral intake due to inability to swallow meals (solid).  She was seen in the ED on 3/16 due to UTI and acute bronchitis and she was discharged with Keflex.  Patient returned to the ED on 3/19 due to vomiting, she was treated with IV Reglan and IV hydration was provided, she was not discharged home.  Patient was nonverbal since she had stroke in 2010, but she was able to communicate and sister at bedside was able to help to provide.  Several nodules have been noted in different parts of patient's body including both arms, anterior and posterior torso.  She also presents with cough with production of clear phlegm.  There was no report of fever, chills, chest pain or shortness of breath. ? ?07/11/21: Patient admitted with oropharyngeal dysphagia in the setting of esophageal malignancy with metastasis.  GI consulted for EGD and biopsy today and patient had been evaluated by anesthesia and felt to be too high risk for procedure here given some bronchial compression.  Patient will be transferred to [facility] for further evaluation.  Replace potassium today and follow a.m. labs. ? ?Patient seen and evaluated at bedside with sister present.  She has been admitted after midnight. ? ?Total care time: 40 minutes. ?

## 2021-07-11 NOTE — H&P (Signed)
?History and Physical  ? ? ?Patient: Danielle Cruz YWV:[id number] DOB: 1955-09-28 ?DOA: 07/10/2021 ?DOS: the patient was seen and examined on 07/11/2021 ?PCP: Fayrene Helper, MD  ?Patient coming from: Home ? ?Chief Complaint:  ?Chief Complaint  ?Patient presents with  ? Weakness  ? ?HPI: Danielle Cruz is a 66 y.o. female with medical history significant of rheumatoid arthritis, PVD, CAD, prior stroke with RUE weakness, right AKA, hypertension, hyperlipidemia who presents to the emergency department accompanied by sister due to 4-week onset of poor oral intake due to inability to swallow meals (solid).  She was seen in the ED on 3/16 due to UTI and acute bronchitis and she was discharged with Keflex.  Patient returned to the ED on 3/19 due to vomiting, she was treated with IV Reglan and IV hydration was provided, she was not discharged home.  Patient was nonverbal since she had stroke in 2010, but she was able to communicate and sister at bedside was able to help to provide.  Several nodules have been noted in different parts of patient's body including both arms, anterior and posterior torso.  She also presents with cough with production of clear phlegm.  There was no report of fever, chills, chest pain or shortness of breath. ? ?ED course: ?In the emergency department, patient was tachypneic, BP was 152/71, but other vital signs were within normal range.  Work-up in the ED showed leukocytosis, normocytic anemia, magnesium 1.9, BNP was positive for hypokalemia.  Influenza A, B, SARS coronavirus 2 was negative. ?CT chest abdomen and pelvis with contrast showed: ?1. 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 ?2. The esophageal mass either invades the posterior wall of the left mainstem bronchus  or compresses it from posteriorly, resulting in severe narrowing of this bronchus. ?3. Dilatation and fluid retention in the esophagus above the esophageal mass. Aspiration precautions recommended. ?4. Fluid in the left lower lobe main and downstream segmental and subsegmental posterior/medial basal bronchi, with coarse left lower lobe atelectatic change. ?5. Moderate thickening and fluid retention in the downstream distal esophagus, where a necrotic inferior mediastinal lymph node impresses on the most distal esophagus. I do not see a good fatplane between this lymph node and the distal esophagus and the lymph  node could be invading this portion of the esophagus. ?6. Cardiomegaly, aortic and coronary artery atherosclerosis. ?7. Moderate to severe calcific stenoses in the proximal right subclavian and right common carotid arteries and severe calcific stenosis in the SMA and suspected at the IMA origin. Correlate clinically for mesenteric vascular insufficiency. The iliac arteries are also heavily calcified. ?8. 1.1 cm solid subcutaneous nodule of the anterolateral left mid abdominal wall not seen in 2016.   Subcutaneous metastasis is possible but would be atypical for an esophageal carcinoma. No other ?subcutaneous abnormality is seen. ?Potassium was replenished, IV hydration was provided.  Hospitalist was asked to admit patient for further evaluation and management. ? ?Review of Systems: As mentioned in the history of present illness. All other systems reviewed and are negative. ? ?Past Medical History:  ?Diagnosis Date  ? AKI (acute kidney injury) ([place]) 09/22/2017  ? Anemia   ? 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 DES to the LAD and the BMS to the OM1- normal  EF  ? Arthritis   ? Atherosclerosis of  native arteries of extremities with gangrene, right leg ([place])   ? CHF (congestive heart failure) ([place]) 10/02/2019  ? Colonic polyp 2010  ? Hemorrhoids; h/o mild hematochezia  ? CVA (cerebral  infarction) 08/2008  ? sizable left -residual expressive aphasia, right sided weakness; ambulates with difficulty with the right leg brace   ? Hyperlipidemia   ? Hypertension 2005  ? Malignant neoplasm of lower third of esophagus ([place]) 07/11/2021  ? Osteoporosis 03/28/2020  ? Rheumatoid arthritis(714.0)   ? Stroke [facility])   ? ?Past Surgical History:  ?Procedure Laterality Date  ? ABDOMINAL AORTOGRAM W/LOWER EXTREMITY Bilateral 01/28/2020  ? Procedure: ABDOMINAL AORTOGRAM W/LOWER EXTREMITY;  Surgeon: Waynetta Sandy, MD;  Location: [facility];  Service: Cardiovascular;  Laterality: Bilateral;  ? ABOVE KNEE LEG AMPUTATION Right 04/30/2020  ? AMPUTATION Right 04/30/2020  ? Procedure: RIGHT ABOVE KNEE AMPUTATION;  Surgeon: [name], MD;  Location: [facility];  Service: Orthopedics;  Laterality: Right;  ? ANKLE SURGERY    ? Right  ? CAROTID STENT INSERTION    ? COLONOSCOPY W/ POLYPECTOMY  11/2008  ? Dr. Gala Romney. Left-sided diverticula, Pedunculated polyp snared (no adenomatous changes)  ? COLONOSCOPY WITH PROPOFOL N/A 01/12/2018  ? Procedure: COLONOSCOPY WITH PROPOFOL;  Surgeon: Daneil Dolin, MD;  [facility];  Service: Endoscopy;  Laterality: N/A;  ? FEMUR IM NAIL Right 09/23/2017  ? Procedure: INTRAMEDULLARY (IM) NAIL FEMORAL;  Surgeon: Renette Butters, MD;  Location: [facility];  Service: Orthopedics;  Laterality: Right;  ? FOOT SURGERY Right 01/31/2020  ? FRACTURE SURGERY    ? Hip replacement in May 2019 - fall related   ? OOPHORECTOMY    ? PERIPHERAL VASCULAR BALLOON ANGIOPLASTY Right 01/28/2020  ? Procedure: PERIPHERAL VASCULAR BALLOON ANGIOPLASTY;  Surgeon: Waynetta Sandy, MD;  Location: [facility];  Service: Cardiovascular;  Laterality: Right;  SFA  ? ?Social History:  reports that she has been smoking cigarettes. She has a 7.50 pack-year smoking history. She has never used smokeless tobacco. She reports that she does not drink alcohol and does not use drugs. ? ?Allergies   ?Allergen Reactions  ? Ace Inhibitors Cough  ?  New daily cough since starting ACE  ? ? ?Family History  ?Problem Relation Age of Onset  ? Cancer Father   ?     prostate, deceased 63s  ? Breast cancer Sister   ?     Deceased 57s  ? Heart attack Mother   ?     deceased 84, during childbirth  ? Cancer Brother   ?     lymphoma  ? Colon cancer Maternal Aunt   ?     older than age 24  ? Liver disease Neg Hx   ? ? ?Prior to Admission medications   ?Medication Sig Start Date End Date Taking? Authorizing Provider  ?alendronate (FOSAMAX) 70 MG tablet Take 70 mg by mouth once a week. 03/03/21   [provider]  ?amLODipine (NORVASC) 5 MG tablet TAKE 1 TABLET(5 MG) BY MOUTH DAILY 06/24/21   Fayrene Helper, MD  ?Ascorbic Acid (VITAMIN C) 1000 MG tablet Take 1,000 mg by mouth daily.    [provider]  ?aspirin EC 81 MG tablet Take 81 mg by mouth daily. Swallow whole.    [provider]  ?benzonatate (TESSALON) 100 MG capsule Take 1 capsule (100 mg total) by mouth 2 (two) times daily as needed for cough. 06/02/21   Lindell Spar, MD  ?  Calcium-Magnesium-Vitamin D (CALCIUM 1200+D3 PO) Take 1 tablet by mouth daily.    [provider]  ?cephALEXin (KEFLEX) 500 MG capsule Take 1 capsule (500 mg total) by mouth 4 (four) times daily for 10 days. 07/02/21 07/12/21  Fransico Meadow, PA-C  ?cetirizine (ZYRTEC) 10 MG tablet Take 10 mg by mouth daily as needed for allergies.    [provider]  ?Cholecalciferol (VITAMIN D3) 125 MCG (5000 UT) CAPS Take 5,000 Units by mouth daily.    [provider]  ?clopidogrel (PLAVIX) 75 MG tablet Take 75 mg by mouth daily. 10/02/20   [provider]  ?ezetimibe (ZETIA) 10 MG tablet TAKE 1 TABLET(10 MG) BY MOUTH DAILY 06/24/21   Fayrene Helper, MD  ?fluticasone (FLONASE) 50 MCG/ACT nasal spray SHAKE LIQUID AND USE 2 SPRAYS IN EACH NOSTRIL DAILY ?Patient taking differently: 2 sprays daily as needed for allergies. 06/24/21   Fayrene Helper, MD  ?HYDROcodone-acetaminophen (NORCO/VICODIN) 5-325 MG tablet Take 1-2 tablets by mouth every 4 (four) hours as needed for moderate pain. 05/12/20   Persons, Bevely Palmer, PA  ?hydroxychloroquine (PLAQUENIL) 200

## 2021-07-11 NOTE — Assessment & Plan Note (Addendum)
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 ?-comfort feeds ?-transition to comfort care/hospice ?

## 2021-07-11 NOTE — Assessment & Plan Note (Signed)
Patient stopped smoking about 4 weeks ago ?

## 2021-07-11 NOTE — Assessment & Plan Note (Addendum)
Subcutaneous nodules noted in anterior tonsil, left upper chest and left arm ?-transition to comfort ?

## 2021-07-11 NOTE — Consult Note (Signed)
Maylon Peppers, M.D. ?Gastroenterology & Hepatology ? ?                                        ? ?Patient Name: Danielle Cruz Account #: '@[id number]'$ @   ?MRN: [id number] Admission Date: 07/10/2021 ?Date of Evaluation:  07/11/2021 Time of Evaluation: 9:18 AM ? ? ?Referring Physician: Bernadette Hoit, DO ? ?Chief Complaint: Dysphagia, weight loss and esophageal mass with metastases ? ?HPI:  This is a 66 y.o. female with history of stroke complicated by right-sided paresis and aphasia, CHF, hyperlipidemia, peripheral arterial disease, rheumatoid arthritis, hypertension, osteoporosis, who was brought to the hospital after presenting poor oral intake due to possible dysphagia and generalized fatigue.  Gastroenterology was consulted for evaluation of esophageal mass with possible metastasis. ? ?Sister provides most of the history as the patient is not able to talk.  Patient has motor aphasia but is able to understand all conversations. ? ?Sister reports that the patient presented worsening fatigue and decreased oral intake for the last 4 weeks which rapidly progressed as she was not able to eat any solids for the last 3 weeks.  She has been only tolerating some oral liquids but not on a consistent fashion as she was spitting fluids frequently and having frequent phlegm production with cough.  Last time she drank something was a soup 2 days ago.  Denies any heartburn or odynophagia.  Sister reported she found that she had some nodules in her arms and in her abdomen recently, as well as in her back.  She denies having any melena, hematochezia, fever, chills, abdominal pain or distention.  Has presented significant weight loss due to poor oral intake. ? ?No family history of malignancy or other gastrointestinal diseases.  Never had an EGD in the past. ? ?In the ED, he was HD stable and afebrile. Labs were remarkable for leukocytosis with white blood cell count of 13,300, hemoglobin was low 11.3, platelets were normal at 291.   BMP showed a potassium of 3.0 with rest of the electrolytes and renal function within normal limits.Chest x-ray showed normal structures.  Underwent initial CT of the neck which showed a lytic lesion in the first right rib and a distended esophagus with wall thickening with possible ?Lymphadenopathy.  She underwent a CT of the chest, abdomen and pelvis with IV contrast which showed presence of esophageal mass likely malignant with possible liver metastases, as well as mediastinal and gastrohepatic ligament adenopathy with right hilar adenopathy, and a 1.5 cm lytic destructive lesion in the anterolateral right first rib.  The esophageal mass was possibly invading the posterior wall of the left mainstem bronchus or compressing it from posteriorly resulting in severe narrowing of the bronchus.  There was also dilation and fluid retention in the esophagus above the area of the mass.  There was also presence of necrotic metastatic needles in proximity to the esophagus.  A 1.1 cm solid subcutaneous nodule was seen in the abdominal wall as well. ? ? ?Past Medical History: SEE CHRONIC ISSSUES: ?Past Medical History:  ?Diagnosis Date  ? AKI (acute kidney injury) ([place]) 09/22/2017  ? Anemia   ? 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 DES to the LAD and the BMS to the OM1- normal  EF  ? Arthritis   ? Atherosclerosis of native arteries of extremities with gangrene, right leg ([place])   ? CHF (congestive heart failure) (  [place]) 10/02/2019  ? Colonic polyp 2010  ? Hemorrhoids; h/o mild hematochezia  ? CVA (cerebral infarction) 08/2008  ? sizable left -residual expressive aphasia, right sided weakness; ambulates with difficulty with the right leg brace   ? Hyperlipidemia   ? Hypertension 2005  ? Malignant neoplasm of lower third of esophagus ([place]) 07/11/2021  ? Osteoporosis 03/28/2020  ? Rheumatoid arthritis(714.0)   ? Stroke [facility])   ? ?Past Surgical History:  ?Past Surgical History:  ?Procedure Laterality Date  ? ABDOMINAL  AORTOGRAM W/LOWER EXTREMITY Bilateral 01/28/2020  ? Procedure: ABDOMINAL AORTOGRAM W/LOWER EXTREMITY;  Surgeon: Waynetta Sandy, MD;  Location: [place] [facility];  Service: Cardiovascular;  Laterality: Bilateral;  ? ABOVE KNEE LEG AMPUTATION Right 04/30/2020  ? AMPUTATION Right 04/30/2020  ? Procedure: RIGHT ABOVE KNEE AMPUTATION;  Surgeon: [name], MD;  Location: [place];  Service: Orthopedics;  Laterality: Right;  ? ANKLE SURGERY    ? Right  ? CAROTID STENT INSERTION    ? COLONOSCOPY W/ POLYPECTOMY  11/2008  ? Dr. Gala Romney. Left-sided diverticula, Pedunculated polyp snared (no adenomatous changes)  ? COLONOSCOPY WITH PROPOFOL N/A 01/12/2018  ? Procedure: COLONOSCOPY WITH PROPOFOL;  Surgeon: Daneil Dolin, MD;  [facility];  Service: Endoscopy;  Laterality: N/A;  ? FEMUR IM NAIL Right 09/23/2017  ? Procedure: INTRAMEDULLARY (IM) NAIL FEMORAL;  Surgeon: Renette Butters, MD;  Location: [place];  Service: Orthopedics;  Laterality: Right;  ? FOOT SURGERY Right 01/31/2020  ? FRACTURE SURGERY    ? Hip replacement in May 2019 - fall related   ? OOPHORECTOMY    ? PERIPHERAL VASCULAR BALLOON ANGIOPLASTY Right 01/28/2020  ? Procedure: PERIPHERAL VASCULAR BALLOON ANGIOPLASTY;  Surgeon: Waynetta Sandy, MD;  Location: [place] [facility];  Service: Cardiovascular;  Laterality: Right;  SFA  ? ?Family History:  ?Family History  ?Problem Relation Age of Onset  ? Cancer Father   ?     prostate, deceased 5s  ? Breast cancer Sister   ?     Deceased 41s  ? Heart attack Mother   ?     deceased 88, during childbirth  ? Cancer Brother   ?     lymphoma  ? Colon cancer Maternal Aunt   ?     older than age 47  ? Liver disease Neg Hx   ? ?Social History:  ?Social History  ? ?Tobacco Use  ? Smoking status: Every Day  ?  Packs/day: 0.50  ?  Years: 15.00  ?  Pack years: 7.50  ?  Types: Cigarettes  ? Smokeless tobacco: Never  ?Vaping Use  ? Vaping Use: Never used  ?Substance Use Topics  ? Alcohol use: No  ?   Alcohol/week: 0.0 standard drinks  ?  Comment: quit 6 years ago   ? Drug use: No  ? ? ?Home Medications:  ?Prior to Admission medications   ?Medication Sig Start Date End Date Taking? Authorizing Provider  ?alendronate (FOSAMAX) 70 MG tablet Take 70 mg by mouth once a week. 03/03/21   [provider]  ?amLODipine (NORVASC) 5 MG tablet TAKE 1 TABLET(5 MG) BY MOUTH DAILY 06/24/21   Fayrene Helper, MD  ?Ascorbic Acid (VITAMIN C) 1000 MG tablet Take 1,000 mg by mouth daily.    [provider]  ?aspirin EC 81 MG tablet Take 81 mg by mouth daily. Swallow whole.    [provider]  ?benzonatate (TESSALON) 100 MG capsule Take 1 capsule (100 mg  total) by mouth 2 (two) times daily as needed for cough. 06/02/21   Lindell Spar, MD  ?Calcium-Magnesium-Vitamin D (CALCIUM 1200+D3 PO) Take 1 tablet by mouth daily.    [provider]  ?cephALEXin (KEFLEX) 500 MG capsule Take 1 capsule (500 mg total) by mouth 4 (four) times daily for 10 days. 07/02/21 07/12/21  Fransico Meadow, PA-C  ?cetirizine (ZYRTEC) 10 MG tablet Take 10 mg by mouth daily as needed for allergies.    [provider]  ?Cholecalciferol (VITAMIN D3) 125 MCG (5000 UT) CAPS Take 5,000 Units by mouth daily.    [provider]  ?clopidogrel (PLAVIX) 75 MG tablet Take 75 mg by mouth daily. 10/02/20   [provider]  ?ezetimibe (ZETIA) 10 MG tablet TAKE 1 TABLET(10 MG) BY MOUTH DAILY 06/24/21   Fayrene Helper, MD  ?fluticasone (FLONASE) 50 MCG/ACT nasal spray SHAKE LIQUID AND USE 2 SPRAYS IN EACH NOSTRIL DAILY ?Patient taking differently: 2 sprays daily as needed for allergies. 06/24/21   Fayrene Helper, MD  ?HYDROcodone-acetaminophen (NORCO/VICODIN) 5-325 MG tablet Take 1-2 tablets by mouth every 4 (four) hours as needed for moderate pain. 05/12/20   Persons, Bevely Palmer, PA  ?hydroxychloroquine (PLAQUENIL) 200 MG tablet Take 200 mg by mouth See admin instructions. Take 200 mg twice daily every 3rd day  08/14/19   [provider]  ?metoprolol succinate (TOPROL-XL) 25 MG 24 hr tablet TAKE 1 TABLET(25 MG) BY MOUTH DAILY 09/12/20   Arnoldo Lenis, MD  ?montelukast (SINGULAIR) 10 MG tablet TAKE 1 TAB

## 2021-07-11 NOTE — Assessment & Plan Note (Addendum)
-  repleted ?-now comfort focused ? ?

## 2021-07-11 NOTE — Assessment & Plan Note (Addendum)
Patient has difficulty in being able to swallow medication possibly due to suspected esophageal cancer. ?-transition to comfort ?

## 2021-07-12 DIAGNOSIS — Z515 Encounter for palliative care: Secondary | ICD-10-CM

## 2021-07-12 DIAGNOSIS — I4891 Unspecified atrial fibrillation: Secondary | ICD-10-CM

## 2021-07-12 DIAGNOSIS — K2289 Other specified disease of esophagus: Secondary | ICD-10-CM

## 2021-07-12 DIAGNOSIS — Z7189 Other specified counseling: Secondary | ICD-10-CM

## 2021-07-12 LAB — CBC
HCT: 32.1 % — ABNORMAL LOW (ref 36.0–46.0)
Hemoglobin: 10.2 g/dL — ABNORMAL LOW (ref 12.0–15.0)
MCH: 29.2 pg (ref 26.0–34.0)
MCHC: 31.8 g/dL (ref 30.0–36.0)
MCV: 92 fL (ref 80.0–100.0)
Platelets: 273 10*3/uL (ref 150–400)
RBC: 3.49 MIL/uL — ABNORMAL LOW (ref 3.87–5.11)
RDW: 15.3 % (ref 11.5–15.5)
WBC: 13.7 10*3/uL — ABNORMAL HIGH (ref 4.0–10.5)
nRBC: 0 % (ref 0.0–0.2)

## 2021-07-12 LAB — COMPREHENSIVE METABOLIC PANEL
ALT: 63 U/L — ABNORMAL HIGH (ref 0–44)
AST: 65 U/L — ABNORMAL HIGH (ref 15–41)
Albumin: 1.7 g/dL — ABNORMAL LOW (ref 3.5–5.0)
Alkaline Phosphatase: 105 U/L (ref 38–126)
Anion gap: 9 (ref 5–15)
BUN: 9 mg/dL (ref 8–23)
CO2: 19 mmol/L — ABNORMAL LOW (ref 22–32)
Calcium: 8.1 mg/dL — ABNORMAL LOW (ref 8.9–10.3)
Chloride: 110 mmol/L (ref 98–111)
Creatinine, Ser: 0.74 mg/dL (ref 0.44–1.00)
GFR, Estimated: >60 mL/min (ref >60)
Glucose, Bld: 83 mg/dL (ref 70–99)
Potassium: 4.4 mmol/L (ref 3.5–5.1)
Sodium: 138 mmol/L (ref 135–145)
Total Bilirubin: 1 mg/dL (ref 0.3–1.2)
Total Protein: 6.6 g/dL (ref 6.5–8.1)

## 2021-07-12 LAB — MAGNESIUM: Magnesium: 1.8 mg/dL (ref 1.7–2.4)

## 2021-07-12 MED ORDER — LACTATED RINGERS IV BOLUS
500.0000 mL | INTRAVENOUS | Status: AC
  Administered 2021-07-12: 500 mL via INTRAVENOUS

## 2021-07-12 MED ORDER — ACETYLCYSTEINE 20 % IN SOLN
3.0000 mL | Freq: Two times a day (BID) | RESPIRATORY_TRACT | Status: DC
  Administered 2021-07-12 – 2021-07-15 (×7): 3 mL via RESPIRATORY_TRACT
  Filled 2021-07-12 (×11): qty 4

## 2021-07-12 MED ORDER — METOPROLOL SUCCINATE ER 25 MG PO TB24: 25.0000 mg | ORAL_TABLET | Freq: Every day | ORAL | Status: DC

## 2021-07-12 MED ORDER — METOPROLOL TARTRATE 5 MG/5ML IV SOLN
2.5000 mg | Freq: Four times a day (QID) | INTRAVENOUS | Status: DC | PRN
  Administered 2021-07-12: 2.5 mg via INTRAVENOUS
  Filled 2021-07-12 (×2): qty 5

## 2021-07-12 MED ORDER — PANTOPRAZOLE SODIUM 40 MG IV SOLR
40.0000 mg | Freq: Two times a day (BID) | INTRAVENOUS | Status: DC
  Administered 2021-07-12 – 2021-07-13 (×4): 40 mg via INTRAVENOUS
  Filled 2021-07-12 (×4): qty 10

## 2021-07-12 MED ORDER — ENOXAPARIN SODIUM 40 MG/0.4ML IJ SOSY: 40.0000 mg | PREFILLED_SYRINGE | Freq: Every day | INTRAMUSCULAR | Status: DC

## 2021-07-12 MED ORDER — GUAIFENESIN-DM 100-10 MG/5ML PO SYRP: 5.0000 mL | ORAL_SOLUTION | ORAL | Status: DC | PRN

## 2021-07-12 MED ORDER — DILTIAZEM HCL-DEXTROSE 125-5 MG/125ML-% IV SOLN (PREMIX)
5.0000 mg/h | INTRAVENOUS | Status: DC
  Filled 2021-07-12: qty 125

## 2021-07-12 MED ORDER — GUAIFENESIN-DM 100-10 MG/5ML PO SYRP
5.0000 mL | ORAL_SOLUTION | Freq: Four times a day (QID) | ORAL | Status: DC
  Administered 2021-07-12: 5 mL via ORAL
  Filled 2021-07-12: qty 5

## 2021-07-12 MED ORDER — METOPROLOL TARTRATE 5 MG/5ML IV SOLN
2.5000 mg | Freq: Three times a day (TID) | INTRAVENOUS | Status: DC
  Administered 2021-07-12: 2.5 mg via INTRAVENOUS
  Filled 2021-07-12: qty 5

## 2021-07-12 MED ORDER — METOPROLOL TARTRATE 5 MG/5ML IV SOLN
2.5000 mg | INTRAVENOUS | Status: AC
  Administered 2021-07-12: 2.5 mg via INTRAVENOUS

## 2021-07-12 MED ORDER — MAGNESIUM SULFATE 2 GM/50ML IV SOLN
2.0000 g | Freq: Once | INTRAVENOUS | Status: AC
  Administered 2021-07-12: 2 g via INTRAVENOUS
  Filled 2021-07-12: qty 50

## 2021-07-12 MED ORDER — METOPROLOL TARTRATE 5 MG/5ML IV SOLN
5.0000 mg | Freq: Three times a day (TID) | INTRAVENOUS | Status: DC
  Administered 2021-07-12 – 2021-07-13 (×2): 5 mg via INTRAVENOUS
  Filled 2021-07-12 (×2): qty 5

## 2021-07-12 NOTE — Consult Note (Signed)
Referring Provider: Dr. Eliseo Squires ?Primary Care Physician:  Fayrene Helper, MD ?Primary Gastroenterologist:  UNASSIGNED ? ?Reason for Consultation:  Esophageal mass ? ?HPI: Danielle Cruz is a 66 y.o. female transferred from [facility] at reported request of anesthesia due to increased risk of an EGD because of compression of esophageal mass on the left main bronchus. Patient has had poor appetite and inability to swallow for a period of time (length of time not known). Sister gives the entire history since pt is nonverbal since a CVA in 2010. CT showed an esophageal mass with mets to the liver, mediastinum, and lungs and pt transferred here for tissue diagnosis by EGD. Esophageal dilation and fluid retention noted above the mid-esophageal mass. No report of melena or hematochezia. Lost 20 pounds in a month. On Plavix and Aspirin as outpt. Nurse reports Afib with RVR overnight treated with Metoprolol. ? ?Past Medical History:  ?Diagnosis Date  ? AKI (acute kidney injury) ([place]) 09/22/2017  ? Anemia   ? 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 DES to the LAD and the BMS to the OM1- normal  EF  ? Arthritis   ? Atherosclerosis of native arteries of extremities with gangrene, right leg ([place])   ? CHF (congestive heart failure) ([place]) 10/02/2019  ? Colonic polyp 2010  ? Hemorrhoids; h/o mild hematochezia  ? CVA (cerebral infarction) 08/2008  ? sizable left -residual expressive aphasia, right sided weakness; ambulates with difficulty with the right leg brace   ? Hyperlipidemia   ? Hypertension 2005  ? Malignant neoplasm of lower third of esophagus ([place]) 07/11/2021  ? Osteoporosis 03/28/2020  ? Rheumatoid arthritis(714.0)   ? Stroke [facility])   ? ? ?Past Surgical History:  ?Procedure Laterality Date  ? ABDOMINAL AORTOGRAM W/LOWER EXTREMITY Bilateral 01/28/2020  ? Procedure: ABDOMINAL AORTOGRAM W/LOWER EXTREMITY;  Surgeon: Waynetta Sandy, MD;  Location: [place] [facility];  Service: Cardiovascular;   Laterality: Bilateral;  ? ABOVE KNEE LEG AMPUTATION Right 04/30/2020  ? AMPUTATION Right 04/30/2020  ? Procedure: RIGHT ABOVE KNEE AMPUTATION;  Surgeon: [name], MD;  Location: [place];  Service: Orthopedics;  Laterality: Right;  ? ANKLE SURGERY    ? Right  ? CAROTID STENT INSERTION    ? COLONOSCOPY W/ POLYPECTOMY  11/2008  ? Dr. Gala Romney. Left-sided diverticula, Pedunculated polyp snared (no adenomatous changes)  ? COLONOSCOPY WITH PROPOFOL N/A 01/12/2018  ? Procedure: COLONOSCOPY WITH PROPOFOL;  Surgeon: Daneil Dolin, MD;  [facility];  Service: Endoscopy;  Laterality: N/A;  ? FEMUR IM NAIL Right 09/23/2017  ? Procedure: INTRAMEDULLARY (IM) NAIL FEMORAL;  Surgeon: Renette Butters, MD;  Location: [place];  Service: Orthopedics;  Laterality: Right;  ? FOOT SURGERY Right 01/31/2020  ? FRACTURE SURGERY    ? Hip replacement in May 2019 - fall related   ? OOPHORECTOMY    ? PERIPHERAL VASCULAR BALLOON ANGIOPLASTY Right 01/28/2020  ? Procedure: PERIPHERAL VASCULAR BALLOON ANGIOPLASTY;  Surgeon: Waynetta Sandy, MD;  Location: [place] [facility];  Service: Cardiovascular;  Laterality: Right;  SFA  ? ? ?Prior to Admission medications   ?Medication Sig Start Date End Date Taking? Authorizing Provider  ?alendronate (FOSAMAX) 70 MG tablet Take 70 mg by mouth once a week. 03/03/21   [provider]  ?amLODipine (NORVASC) 5 MG tablet TAKE 1 TABLET(5 MG) BY MOUTH DAILY 06/24/21   Fayrene Helper, MD  ?Ascorbic Acid (VITAMIN C) 1000 MG tablet Take 1,000 mg by mouth daily.  [provider]  ?aspirin EC 81 MG tablet Take 81 mg by mouth daily. Swallow whole.    [provider]  ?benzonatate (TESSALON) 100 MG capsule Take 1 capsule (100 mg total) by mouth 2 (two) times daily as needed for cough. 06/02/21   Lindell Spar, MD  ?Calcium-Magnesium-Vitamin D (CALCIUM 1200+D3 PO) Take 1 tablet by mouth daily.    [provider]  ?cephALEXin (KEFLEX) 500 MG capsule Take 1 capsule  (500 mg total) by mouth 4 (four) times daily for 10 days. 07/02/21 07/12/21  Fransico Meadow, PA-C  ?cetirizine (ZYRTEC) 10 MG tablet Take 10 mg by mouth daily as needed for allergies.    [provider]  ?Cholecalciferol (VITAMIN D3) 125 MCG (5000 UT) CAPS Take 5,000 Units by mouth daily.    [provider]  ?clopidogrel (PLAVIX) 75 MG tablet Take 75 mg by mouth daily. 10/02/20   [provider]  ?ezetimibe (ZETIA) 10 MG tablet TAKE 1 TABLET(10 MG) BY MOUTH DAILY 06/24/21   Fayrene Helper, MD  ?fluticasone (FLONASE) 50 MCG/ACT nasal spray SHAKE LIQUID AND USE 2 SPRAYS IN EACH NOSTRIL DAILY ?Patient taking differently: 2 sprays daily as needed for allergies. 06/24/21   Fayrene Helper, MD  ?HYDROcodone-acetaminophen (NORCO/VICODIN) 5-325 MG tablet Take 1-2 tablets by mouth every 4 (four) hours as needed for moderate pain. 05/12/20   Persons, Bevely Palmer, PA  ?hydroxychloroquine (PLAQUENIL) 200 MG tablet Take 200 mg by mouth See admin instructions. Take 200 mg twice daily every 3rd day 08/14/19   [provider]  ?metoprolol succinate (TOPROL-XL) 25 MG 24 hr tablet TAKE 1 TABLET(25 MG) BY MOUTH DAILY 09/12/20   Arnoldo Lenis, MD  ?montelukast (SINGULAIR) 10 MG tablet TAKE 1 TABLET(10 MG) BY MOUTH AT BEDTIME 06/24/21   Fayrene Helper, MD  ?Multiple Vitamin (MULTIVITAMIN WITH MINERALS) TABS tablet Take 1 tablet by mouth daily.    [provider]  ?oxybutynin (DITROPAN) 5 MG tablet TAKE 1/2 TABLET(2.5 MG) BY MOUTH TWICE DAILY 06/24/21   Fayrene Helper, MD  ?pantoprazole (PROTONIX) 40 MG tablet TAKE 1 TABLET(40 MG) BY MOUTH DAILY 06/24/21   Fayrene Helper, MD  ?potassium chloride SA (KLOR-CON M) 20 MEQ tablet Take 1 tablet (20 mEq total) by mouth daily. 04/28/21   Arnoldo Lenis, MD  ?rosuvastatin (CRESTOR) 20 MG tablet TAKE 1 TABLET BY MOUTH EVERY MONDAY, WEDNESDAY, FRIDAY, AND SATURDAY 05/08/20   Arnoldo Lenis, MD  ?torsemide (DEMADEX) 20 MG tablet Take 0.5  tablets (10 mg total) by mouth as needed. 05/13/20 08/11/20  Erma Heritage, PA-C  ?UNABLE TO FIND Under pads use as needed  ?Pullups use as needed  ? ?Length of need- 99 months ?DX urinary incontinence 01/02/19   Fayrene Helper, MD  ?UNABLE TO FIND Incontinence briefs and pads ?Dx incontinence 10/02/19   Fayrene Helper, MD  ? ? ?Scheduled Meds: ? enoxaparin (LOVENOX) injection  40 mg Subcutaneous Daily  ? ?Continuous Infusions: ? sodium chloride 75 mL/hr at 07/11/21 1427  ? ?PRN Meds:.guaiFENesin-dextromethorphan, iohexol, metoprolol tartrate ? ?Allergies as of 07/10/2021 - Review Complete 07/10/2021  ?Allergen Reaction Noted  ? Ace inhibitors Cough 11/18/2014  ? ? ?Family History  ?Problem Relation Age of Onset  ? Cancer Father   ?     prostate, deceased 70s  ? Breast cancer Sister   ?     Deceased 39s  ? Heart attack Mother   ?     deceased 17,  during childbirth  ? Cancer Brother   ?     lymphoma  ? Colon cancer Maternal Aunt   ?     older than age 32  ? Liver disease Neg Hx   ? ? ?Social History  ? ?Socioeconomic History  ? Marital status: Married  ?  Spouse name: Not on file  ? Number of children: 3  ? Years of education: 11th grade  ? Highest education level: 11th grade  ?Occupational History  ? Occupation: disabilty x 9years  ?  Comment: secondary to arthritis   ?Tobacco Use  ? Smoking status: Every Day  ?  Packs/day: 0.50  ?  Years: 15.00  ?  Pack years: 7.50  ?  Types: Cigarettes  ? Smokeless tobacco: Never  ?Vaping Use  ? Vaping Use: Never used  ?Substance and Sexual Activity  ? Alcohol use: No  ?  Alcohol/week: 0.0 standard drinks  ?  Comment: quit 6 years ago   ? Drug use: No  ? Sexual activity: Not Currently  ?  Birth control/protection: Post-menopausal  ?Other Topics Concern  ? Not on file  ?Social History Narrative  ? Pt gave birth to 4 children, 1 deceased at 34 weeks was a premature baby, 3 are living ages range 2 to 15 all healthy   ? ?Social Determinants of Health  ? ?Financial Resource  Strain: Low Risk   ? Difficulty of Paying Living Expenses: Not hard at all  ?Food Insecurity: No Food Insecurity  ? Worried About Charity fundraiser in the Last Year: Never true  ? Ran Out of Food in the [employer]

## 2021-07-12 NOTE — Progress Notes (Signed)
?   07/12/21 0148  ?Assess: MEWS Score  ?Temp 97.9 ?F (36.6 ?C)  ?BP (!) 143/99  ?Pulse Rate (!) 155  ?Resp 20  ?SpO2 100 %  ?O2 Device Room Air  ?Assess: MEWS Score  ?MEWS Temp 0  ?MEWS Systolic 0  ?MEWS Pulse 3  ?MEWS RR 0  ?MEWS LOC 0  ?MEWS Score 3  ?MEWS Score Color Yellow  ?Assess: if the MEWS score is Yellow or Red  ?Were vital signs taken at a resting state? Yes  ?Focused Assessment Change from prior assessment (see assessment flowsheet)  ?Early Detection of Sepsis Score *See Row Information* Low  ?MEWS guidelines implemented *See Row Information* Yes  ?Take Vital Signs  ?Increase Vital Sign Frequency  Yellow: Q 2hr X 2 then Q 4hr X 2, if remains yellow, continue Q 4hrs  ?Escalate  ?MEWS: Escalate Yellow: discuss with charge nurse/RN and consider discussing with provider and RRT  ?Notify: Charge Nurse/RN  ?Name of Charge Nurse/RN Notified Enid Derry, RN  ?Date Charge Nurse/RN Notified 07/12/21  ?Time Charge Nurse/RN Notified 0150  ?Notify: Provider  ?Provider Name/Title Irene Pap, MD  ?Date Provider Notified 07/12/21  ?Time Provider Notified [phone]  ?Notification Type Page ?(secure chat)  ?Notification Reason Change in status  ?Provider response See new orders  ?Date of Provider Response 07/12/21  ?Time of Provider Response [phone]  ?Notify: Rapid Response  ?Name of Rapid Response RN Notified N/A  ?Document  ?Patient Outcome Not stable and remains on department  ?Progress note created (see row info) Yes  ? ?Notified by telemetry patient's HR is on the 150-160's. Afib rhythm. EKG performed. On call MD Irene Pap made aware ?

## 2021-07-12 NOTE — Consult Note (Signed)
? ?                                                                                ?Consultation Note ?Date: 07/12/2021  ? ?Patient Name: Danielle Cruz  ?DOB: 07/05/1955  MRN: [id number]  Age / Sex: 66 y.o., female  ?PCP: Fayrene Helper, MD ?Referring Physician: Geradine Girt, DO ? ?Reason for Consultation: Goals of care ? ?HPI/Patient Profile: 66 y.o. female  with past medical history of toyed arthritis, coronary artery disease, history of stroke with right upper extremity weakness and aphasia-she is nonverbal, right AKA, hypertension, hyperlipidemia, peripheral vascular disease, admitted on 07/10/2021 with cough and copious production of phlegm.  She has been losing weight she has not been able to eat.  CT scan of the abdomen and pelvis showed esophageal mass that was pressing on her bronchus, liver, lymph node, and rib mets likely.  She was transferred here from [facility] due to need for endoscopy for biopsy, but she is high risk due to the mask compressing her bronchus.  Medicine consulted for goals of care. ? ?Primary Decision Maker ?NEXT OF KIN-spouse Danielle Cruz ? ?Discussion: ?Chart reviewed including imaging labs and progress notes.  Evaluated patient.  She was sitting up awake and alert.  Communication is limited due to her aphasia.  She denied pain or any significant worries.  Her son was at the bedside.  He reports that her husband Danielle Cruz helps with her medical decision making. ?I called Danielle Cruz. ?Prior to this admission Danielle Cruz was living in the home she is pretty much bedbound she does get up to use the bedside toilet.  Family provides her care at home. ?Discussed with Danielle Cruz CT scans and the possible implications of the findings within the context of her chronic illness and debility. ?Danielle Cruz is aware the plan is for endoscopy tomorrow in order to obtain a biopsy. ?Danielle Cruz is unsure if Danielle Cruz would want to proceed with aggressive therapy if cancer is proven present.  He does want to  discuss this with Danielle Cruz.  Danielle Cruz is not feeling well at this point and cannot come to the hospital. ?We discussed Danielle Cruz does not think that Danielle Cruz would want CPR in the event that she did not have a pulse and was not breathing.  However he does want to ask her about this before making that final decision. ?  ? 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  He does want to discuss this with his wife and also discussed her CODE STATUS, however he is currently sick and cannot come to the hospital he is hopeful to feel better in the next few days and request the presence of palliative care provider when he is able to be here to discuss goals of care with Danielle Cruz ?-PMT will continue to follow and will facilitate further decision making after biopsy results and whenever patient's spouse is able to be present ? ?Code Status/Advance Care Planning: ?Full code ? ? ?Prognosis:   ?Unable to determine ? ?  Discharge Planning: To Be Determined ? ?Primary Diagnoses: ?Present on Admission: ? Oropharyngeal dysphagia ? Tobacco abuse ? Leukocytosis ? PVD (peripheral vascular disease) ([place]) ? ? ?Review of Systems ? ?Physical Exam ? ?Vital Signs: BP (!) 181/71 (BP Location: Left Arm)   Pulse 96   Temp 98.2 ?F (36.8 ?C)   Resp 16   Ht '5\' 7"'$  (1.702 m)   Wt 65 kg   SpO2 100%   BMI 22.44 kg/m?  ?Pain Scale: 0-10 ?  ?Pain Score: 0-No pain ? ? ?SpO2: SpO2: 100 % ?O2 Device:SpO2: 100 % ?O2 Flow Rate: .  ? ?IO: Intake/output summary:  ?Intake/Output Summary (Last 24 hours) at 07/12/2021 1433 ?Last data filed at 07/12/2021 0800 ?Gross per 24 hour  ?Intake 1503.3 ml  ?Output 425 ml  ?Net 1078.3 ml  ? ? ?LBM: Last BM Date : 07/09/21 ?Baseline Weight: Weight: 65 kg ?Most recent weight: Weight: 65 kg     ? ? ? ?Thank you for this consult. Palliative  medicine will continue to follow and assist as needed.  ? ? ?Signed by: ?Mariana Kaufman, AGNP-C ?Palliative Medicine ? ?  ?Please contact Palliative Medicine Team phone at [phone] for questions and concerns.  ?For individual provider: See Amion ? ? ? ? ? ? ? ? ? ? ? ? ? ? ?

## 2021-07-12 NOTE — Hospital Course (Addendum)
Danielle Cruz is a 66 y.o. female with medical history significant of rheumatoid arthritis, PVD, CAD, prior stroke with RUE weakness, right AKA, hypertension, hyperlipidemia who presents to the emergency department accompanied by sister due to 4-week onset of poor oral intake due to inability to swallow meals (solid).  She was seen in the ED on 3/16 due to UTI and acute bronchitis and she was discharged with Keflex.  Patient returned to the ED on 3/19 due to vomiting, she was treated with IV Reglan and IV hydration was provided, she was not discharged home.  Patient was nonverbal since she had stroke in 2010, but she was able to communicate and sister at bedside was able to help to provide.  Several nodules have been noted in different parts of patient's body including both arms, anterior and posterior torso.  She also presents with cough with production of clear phlegm.  There was no report of fever, chills, chest pain or shortness of breath. ?  ?07/11/21: Patient admitted with oropharyngeal dysphagia in the setting of esophageal malignancy with metastasis.  GI consulted for EGD and biopsy today and patient had been evaluated by anesthesia and felt to be too high risk for procedure here given some bronchial compression.  Patient will be transferred to [facility] for further evaluation.  ? ?Also found to be in a fib upon admission.  ? ?3/27: family do not want to under-go the high risk EGD.  Plan to transition to hospice with goal of home  hospice when able. ? ?

## 2021-07-12 NOTE — Assessment & Plan Note (Addendum)
-  new? ?-paroxysmal ?-BB IV Scheduled for now ?-no anticoagulation as transition to comfort ?- echo done ? ?

## 2021-07-12 NOTE — Progress Notes (Signed)
?PROGRESS NOTE ? ? ? ?Danielle Cruz  WTU:[id number] DOB: [birth year] DOA: 07/10/2021 ?PCP: Fayrene Helper, MD  ? ? ?Brief Narrative:  ?Danielle Cruz is a 66 y.o. female with medical history significant of rheumatoid arthritis, PVD, CAD, prior stroke with RUE weakness, right AKA, hypertension, hyperlipidemia who presents to the emergency department accompanied by sister due to 4-week onset of poor oral intake due to inability to swallow meals (solid).  She was seen in the ED on 3/16 due to UTI and acute bronchitis and she was discharged with Keflex.  Patient returned to the ED on 3/19 due to vomiting, she was treated with IV Reglan and IV hydration was provided, she was not discharged home.  Patient was nonverbal since she had stroke in 2010, but she was able to communicate and sister at bedside was able to help to provide.  Several nodules have been noted in different parts of patient's body including both arms, anterior and posterior torso.  She also presents with cough with production of clear phlegm.  There was no report of fever, chills, chest pain or shortness of breath. ?  ?07/11/21: Patient admitted with oropharyngeal dysphagia in the setting of esophageal malignancy with metastasis.  GI consulted for EGD and biopsy today and patient had been evaluated by anesthesia and felt to be too high risk for procedure here given some bronchial compression.  Patient will be transferred to [facility] for further evaluation.   ? ? ?Assessment and Plan: ?* Oropharyngeal dysphagia ?Patient was suspected to have Stage IV malignancy with a likely esophageal primary which may be responsible for patient's difficulty in being able to swallow. ?Continue gentle hydration ?-once biopsy back will consult oncology ?-will get palliative care consult prior ? ?Malignant neoplasm of lower third of esophagus (HCC) ?-CT abdomen and pelvis with contrast showed Stage IV malignancy with a likely esophageal primary, with liver metastases,  mediastinal and gastrohepatic ligament adenopathy with additional right hilar adenopathy, and a 1.5 cm lytic destructive lesion in the anterolateral right first rib presumably also met ? ? ?Subcutaneous nodules ?Subcutaneous nodules noted in anterior tonsil, left upper chest and left arm ? ?Prolonged QT interval ?-replete K and Mg ? ? ?Leukocytosis ?-trend ? ?History of CVA (cerebrovascular accident) ?Patient has difficulty in being able to swallow medication possibly due to suspected esophageal cancer. ?Consider restarting home meds once this condition improves ?-sister does not think patient has had plavix in last month ? ?PVD (peripheral vascular disease) ([place]) ?Patient has right-sided AKA ?She has difficulty in being able to swallow pills, consider restarting home meds when this condition improves ? ?Hypokalemia ?-replete ? ? ?Tobacco abuse ?Patient stopped smoking about 4 weeks ago ? ? ? ? ? ? ? ? ? ?DVT prophylaxis: enoxaparin (LOVENOX) injection 40 mg Start: 07/11/21 1000 ?SCDs Start: 07/11/21 0725 ? ?  Code Status: Full Code ?Family Communication: at bedside ? ?Disposition Plan:  ?Level of care: Telemetry Medical ?Status is: Inpatient ?Remains inpatient appropriate because: needs GI eval ?  ? ?Consultants:  ?GI ? ? ? ? ?Subjective: ?Coughing up lots of mucus that gets stuck sometimes ? ?Objective: ?Vitals:  ? 07/12/21 0241 07/12/21 0351 07/12/21 0556 07/12/21 1007  ?BP: 119/85 (!) 154/73 (!) 148/72 (!) 155/64  ?Pulse: (!) 150 99 97 82  ?Resp: _0 ?Temp: 98 ?F (36.7 ?C) 97.8 ?F (36.6 ?C) 98.2 ?F (36.8 ?C) 98.2 ?F (36.8 ?C)  ?TempSrc: Oral     ?SpO2: 100% 100% 97% 100%  ?  Weight:      ?Height:      ? ? ?Intake/Output Summary (Last 24 hours) at 07/12/2021 1220 ?Last data filed at 07/12/2021 0800 ?Gross per 24 hour  ?Intake 2350.13 ml  ?Output 425 ml  ?Net 1925.13 ml  ? ?Filed Weights  ? 07/10/21 1716  ?Weight: 65 kg  ? ? ?Examination: ? ? ?General: Appearance:    Chronically ill appearing female in no  acute distress  ?   ?Lungs:      respirations unlabored  ?Heart:    Normal heart rate.  ?  ?MS:   Above knee amputation of right lower extremity is noted.  ?  ?Neurologic:   Awake, alert- can nod to questions  ?  ? ? ? ?Data Reviewed: I have personally reviewed following labs and imaging studies ? ?CBC: ?Recent Labs  ?Lab 07/05/21 ?1314 07/10/21 ?1750 07/11/21 ?0753 07/12/21 ?0725  ?WBC 12.5* 13.3* 14.3* 13.7*  ?HGB 12.3 11.3* 10.5* 10.2*  ?HCT 38.1 36.1 32.7* 32.1*  ?MCV 93.6 90.9 92.4 92.0  ?PLT 299 291 263 273  ? ?Basic Metabolic Panel: ?Recent Labs  ?Lab 07/05/21 ?1314 07/10/21 ?1750 07/11/21 ?0753 07/12/21 ?0725  ?NA 137 136 137 138  ?K 3.1* 3.0* 3.4* 4.4  ?CL 103 104 110 110  ?CO2 21* 23 18* 19*  ?GLUCOSE 120* 104* 95 83  ?BUN 7* _0 ?CREATININE 1.05* 0.89 0.77 0.74  ?CALCIUM 8.5* 8.6* 8.2* 8.1*  ?MG  --  1.9  --  1.8  ?PHOS  --   --  3.7  --   ? ?GFR: ?Estimated Creatinine Clearance: 68.2 mL/min (by C-G formula based on SCr of 0.74 mg/dL). ?Liver Function Tests: ?Recent Labs  ?Lab 07/05/21 ?1314 07/11/21 ?0753 07/12/21 ?0725  ?AST 48* 61* 65*  ?ALT 60* 67* 63*  ?ALKPHOS 110 109 105  ?BILITOT 0.7 0.6 1.0  ?PROT 8.4* 7.1 6.6  ?ALBUMIN 2.1* 2.0* 1.7*  ? ?Recent Labs  ?Lab 07/05/21 ?1314  ?LIPASE 30  ? ?No results for input(s): AMMONIA in the last 168 hours. ?Coagulation Profile: ?No results for input(s): INR, PROTIME in the last 168 hours. ?Cardiac Enzymes: ?No results for input(s): CKTOTAL, CKMB, CKMBINDEX, TROPONINI in the last 168 hours. ?BNP (last 3 results) ?No results for input(s): PROBNP in the last 8760 hours. ?HbA1C: ?No results for input(s): HGBA1C in the last 72 hours. ?CBG: ?No results for input(s): GLUCAP in the last 168 hours. ?Lipid Profile: ?No results for input(s): CHOL, HDL, LDLCALC, TRIG, CHOLHDL, LDLDIRECT in the last 72 hours. ?Thyroid Function Tests: ?No results for input(s): TSH, T4TOTAL, FREET4, T3FREE, THYROIDAB in the last 72 hours. ?Anemia Panel: ?No results for input(s):  VITAMINB12, FOLATE, FERRITIN, TIBC, IRON, RETICCTPCT in the last 72 hours. ?Sepsis Labs: ?No results for input(s): PROCALCITON, LATICACIDVEN in the last 168 hours. ? ?Recent Results (from the past 240 hour(s))  ?Resp Panel by RT-PCR (Flu A&B, Covid) Nasopharyngeal Swab     Status: None  ? Collection Time: 07/02/21  1:34 PM  ? Specimen: Nasopharyngeal Swab; Nasopharyngeal(NP) swabs in vial transport medium  ?Result Value Ref Range Status  ? SARS Coronavirus 2 by RT PCR NEGATIVE NEGATIVE Final  ?  Comment: (NOTE) ?SARS-CoV-2 target nucleic acids are NOT DETECTED. ? ?The SARS-CoV-2 RNA is generally detectable in upper respiratory ?specimens during the acute phase of infection. The lowest ?concentration of SARS-CoV-2 viral copies this assay can detect is ?138 copies/mL. A negative result does not preclude SARS-Cov-2 ?infection and should not be used as  the sole basis for treatment or ?other patient management decisions. A negative result may occur with  ?improper specimen collection/handling, submission of specimen other ?than nasopharyngeal swab, presence of viral mutation(s) within the ?areas targeted by this assay, and inadequate number of viral ?copies(<138 copies/mL). A negative result must be combined with ?clinical observations, patient history, and epidemiological ?information. The expected result is Negative. ? ?Fact Sheet for Patients:  ?EntrepreneurPulse.com.au ? ?Fact Sheet for Healthcare Providers:  ?IncredibleEmployment.be ? ?This test is no t yet approved or cleared by the [place] FDA and  ?has been authorized for detection and/or diagnosis of SARS-CoV-2 by ?FDA under an Emergency Use Authorization (EUA). This EUA will remain  ?in effect (meaning this test can be used) for the duration of the ?COVID-19 declaration under Section 564(b)(1) of the Act, 21 ?U.S.C.section 360bbb-3(b)(1), unless the authorization is terminated  ?or revoked sooner.  ? ? ?  ? Influenza A  by PCR NEGATIVE NEGATIVE Final  ? Influenza B by PCR NEGATIVE NEGATIVE Final  ?  Comment: (NOTE) ?The Xpert Xpress SARS-CoV-2/FLU/RSV plus assay is intended as an aid ?in the diagnosis of influenza from Nasopharyngeal

## 2021-07-13 ENCOUNTER — Inpatient Hospital Stay (HOSPITAL_COMMUNITY): Payer: Medicare Other

## 2021-07-13 DIAGNOSIS — I4891 Unspecified atrial fibrillation: Secondary | ICD-10-CM

## 2021-07-13 LAB — ECHOCARDIOGRAM COMPLETE
AR max vel: 2.27 cm2
AV Area VTI: 2.22 cm2
AV Area mean vel: 2.26 cm2
AV Mean grad: 7 mmHg
AV Peak grad: 13.7 mmHg
AV Vena cont: 0.2 cm
Ao pk vel: 1.85 m/s
Area-P 1/2: 3.77 cm2
Calc EF: 61.9 %
Height: 67 in
P 1/2 time: 332 msec
S' Lateral: 2.2 cm
Single Plane A2C EF: 61.3 %
Single Plane A4C EF: 65.4 %
Weight: 2292.78 oz

## 2021-07-13 LAB — CBC
HCT: 31 % — ABNORMAL LOW (ref 36.0–46.0)
Hemoglobin: 10.2 g/dL — ABNORMAL LOW (ref 12.0–15.0)
MCH: 29.8 pg (ref 26.0–34.0)
MCHC: 32.9 g/dL (ref 30.0–36.0)
MCV: 90.6 fL (ref 80.0–100.0)
Platelets: 253 10*3/uL (ref 150–400)
RBC: 3.42 MIL/uL — ABNORMAL LOW (ref 3.87–5.11)
RDW: 15.5 % (ref 11.5–15.5)
WBC: 13.7 10*3/uL — ABNORMAL HIGH (ref 4.0–10.5)
nRBC: 0 % (ref 0.0–0.2)

## 2021-07-13 LAB — BASIC METABOLIC PANEL
Anion gap: 6 (ref 5–15)
BUN: 9 mg/dL (ref 8–23)
CO2: 16 mmol/L — ABNORMAL LOW (ref 22–32)
Calcium: 8.1 mg/dL — ABNORMAL LOW (ref 8.9–10.3)
Chloride: 116 mmol/L — ABNORMAL HIGH (ref 98–111)
Creatinine, Ser: 0.77 mg/dL (ref 0.44–1.00)
GFR, Estimated: >60 mL/min (ref >60)
Glucose, Bld: 85 mg/dL (ref 70–99)
Potassium: 3 mmol/L — ABNORMAL LOW (ref 3.5–5.1)
Sodium: 138 mmol/L (ref 135–145)

## 2021-07-13 LAB — TSH: TSH: 1.653 u[IU]/mL (ref 0.350–4.500)

## 2021-07-13 MED ORDER — METOPROLOL TARTRATE 5 MG/5ML IV SOLN
5.0000 mg | Freq: Four times a day (QID) | INTRAVENOUS | Status: DC
  Administered 2021-07-13 – 2021-07-14 (×4): 5 mg via INTRAVENOUS
  Filled 2021-07-13 (×4): qty 5

## 2021-07-13 MED ORDER — IPRATROPIUM-ALBUTEROL 0.5-2.5 (3) MG/3ML IN SOLN
3.0000 mL | RESPIRATORY_TRACT | Status: DC | PRN
  Administered 2021-07-13 – 2021-07-15 (×5): 3 mL via RESPIRATORY_TRACT
  Filled 2021-07-13 (×5): qty 3

## 2021-07-13 MED ORDER — POTASSIUM CHLORIDE 10 MEQ/100ML IV SOLN
10.0000 meq | INTRAVENOUS | Status: AC
  Administered 2021-07-13 (×5): 10 meq via INTRAVENOUS
  Filled 2021-07-13 (×5): qty 100

## 2021-07-13 NOTE — Progress Notes (Signed)
Was informed by Dr. Collene Mares that patient and family do not want to undergo EGD/any other procedures and wish to go home with hospice/comfort care.  Briefly discussed code status and comfort feeds.  Will resume soft diet with known risk of aspiration.  They will want to go home with hospice.  Will place [facility] consult. ?JV ?

## 2021-07-13 NOTE — Progress Notes (Signed)
?  Echocardiogram ?2D Echocardiogram has been performed. ? ?Danielle Cruz ?07/13/2021, 2:35 PM ?

## 2021-07-13 NOTE — Progress Notes (Addendum)
UNASSIGNED PATENT ?Subjective: ?Danielle Cruz is a 66 year old black female with multiple medical problems including history of HTN, hyperlipidemia rheumatoid arthritis, PVD, CAD, prior stroke with RUE weakness, right AKA, who presents to the emergency department with a history of weakness and inability to swallow for the last 6 weeks. The patient is nonverbal since 2010 and most of the history has been procured from her daughter Danielle Cruz phone [phone].  Her sister was also at the bedside husband was on the phone while I spoke to them about the patient's history. She was seen in the ED on 3/16 due to UTI and acute bronchitis and she was discharged with Keflex. Patient returned to the ED on 3/19 due to vomiting, she was treated with IV Reglan and IV hydration was provided, she was  discharged home. Patient is able to communicate and sister at bedside was able to help to provide.   ?On 07/11/21 patient was admitted with oropharyngeal dysphagia in the setting of esophageal malignancy with metastasis. GI consultation was procured for possible EGD to procure tissue diagnosis for further evaluation and treatment. The anesthesia service found a condition to be at very high risk for any endoscopic intervention.Patient will be transferred to [facility] for further evaluation a CT of the abdomen and chest with contrast done on 07/10/2021 ending most consistent with primary esophageal carcinoma with invasion or compression of the posterior wall of the left main bronchus severely narrowing its lumen along with abutting necrotic adenopathy in the subclavian renal mediastinum with a conglomerate nodal mass in the area as well measuring up to 4 cm anterior posteriorly.  There is also centrally necrotic nodal mass impressing on the lower end of the esophagus between the distal thoracic aorta and the esophagus along with a higher hilar adenopathy t noted as well.  There is fluid and debris filling the lower lobe of the main  bronchus and extending into the posterior medial basal segment bronchi branch bronchi with coarse linear atelectatic changes in the posterior medial basilar left lower lobe. ? ?Objective: ?Vital signs in last 24 hours: ?Temp:  [97.8 ?F (36.6 ?C)-98.1 ?F (36.7 ?C)] 97.9 ?F (36.6 ?C) (03/27 2353) ?Pulse Rate:  [83-96] 83 (03/27 0923) ?Resp:  [17-19] 19 (03/27 6144) ?BP: (103-181)/(66-91) 171/71 (03/27 3154) ?SpO2:  [95 %-97 %] 97 % (03/27 0923) ?Last BM Date : 07/09/21 ? ?Intake/Output from previous day: ?03/26 0701 - 03/27 0700 ?In: 1346.9 [P.O.:90; I.V.:1206.9; IV Piggyback:50] ?Out: 600 [Urine:600] ?Intake/Output this shift: ?Total I/O ?In: 55 [P.O.:60] ?Out: -  ? ?General appearance: alert, cooperative, appears stated age, fatigued, and mild distress ?Resp: clear to auscultation bilaterally ?Cardio: irregularly irregular rhythm ?GI: soft, non-tender; bowel sounds normal; no masses,  no organomegaly ? ?Lab Results: ?Recent Labs  ?  07/11/21 ?0086 07/12/21 ?0725 07/13/21 ?0506  ?WBC 14.3* 13.7* 13.7*  ?HGB 10.5* 10.2* 10.2*  ?HCT 32.7* 32.1* 31.0*  ?PLT 263 273 253  ? ?BMET ?Recent Labs  ?  07/11/21 ?0753 07/12/21 ?0725 07/13/21 ?0506  ?NA 137 138 138  ?K 3.4* 4.4 3.0*  ?CL 110 110 116*  ?CO2 18* 19* 16*  ?GLUCOSE 95 83 85  ?BUN '11 9 9  '$ ?CREATININE 0.77 0.74 0.77  ?CALCIUM 8.2* 8.1* 8.1*  ? ?LFT ?Recent Labs  ?  07/12/21 ?0725  ?PROT 6.6  ?ALBUMIN 1.7*  ?AST 65*  ?ALT 63*  ?ALKPHOS 105  ?BILITOT 1.0  ? ?Medications: I have reviewed the patient's current medications. ?Prior to Admission:  ?Medications Prior to Admission  ?  Medication Sig Dispense Refill Last Dose  ? alendronate (FOSAMAX) 70 MG tablet Take 70 mg by mouth once a week.   Past Month  ? amLODipine (NORVASC) 5 MG tablet TAKE 1 TABLET(5 MG) BY MOUTH DAILY (Patient taking differently: Take 5 mg by mouth daily.) 30 tablet 3 Past Month  ? Ascorbic Acid (VITAMIN C) 1000 MG tablet Take 1,000 mg by mouth daily.   Past Month  ? aspirin EC 81 MG tablet Take 81 mg by  mouth daily. Swallow whole.   Past Month  ? Calcium-Magnesium-Vitamin D (CALCIUM 1200+D3 PO) Take 1 tablet by mouth daily.   Past Month  ? [EXPIRED] cephALEXin (KEFLEX) 500 MG capsule Take 1 capsule (500 mg total) by mouth 4 (four) times daily for 10 days. 40 capsule 0 07/09/2021  ? cetirizine (ZYRTEC) 10 MG tablet Take 10 mg by mouth daily as needed for allergies.   More than a month ago  ? Cholecalciferol (VITAMIN D3) 125 MCG (5000 UT) CAPS Take 5,000 Units by mouth daily.   Past Month  ? clopidogrel (PLAVIX) 75 MG tablet Take 75 mg by mouth daily.   Past Month  ? ezetimibe (ZETIA) 10 MG tablet TAKE 1 TABLET(10 MG) BY MOUTH DAILY (Patient taking differently: Take 10 mg by mouth daily.) 90 tablet 3 Past Month  ? fluticasone (FLONASE) 50 MCG/ACT nasal spray SHAKE LIQUID AND USE 2 SPRAYS IN EACH NOSTRIL DAILY (Patient taking differently: 2 sprays daily as needed for allergies.) 16 g 6 Past Month  ? HYDROcodone-acetaminophen (NORCO/VICODIN) 5-325 MG tablet Take 1-2 tablets by mouth every 4 (four) hours as needed for moderate pain. 30 tablet 0 More than a month ago  ? hydroxychloroquine (PLAQUENIL) 200 MG tablet Take 200 mg by mouth See admin instructions. Take 200 mg twice daily every 3rd day   Past Month  ? metoprolol succinate (TOPROL-XL) 25 MG 24 hr tablet TAKE 1 TABLET(25 MG) BY MOUTH DAILY (Patient taking differently: Take 25 mg by mouth daily.) 30 tablet 11 Past Month at [facility]  ? montelukast (SINGULAIR) 10 MG tablet TAKE 1 TABLET(10 MG) BY MOUTH AT BEDTIME (Patient taking differently: Take 10 mg by mouth at bedtime.) 30 tablet 4 Past Month  ? Multiple Vitamin (MULTIVITAMIN WITH MINERALS) TABS tablet Take 1 tablet by mouth daily.   Past Month  ? oxybutynin (DITROPAN) 5 MG tablet TAKE 1/2 TABLET(2.5 MG) BY MOUTH TWICE DAILY (Patient taking differently: Take 2.5 mg by mouth 2 (two) times daily.) 90 tablet 3 Past Month  ? pantoprazole (PROTONIX) 40 MG tablet TAKE 1 TABLET(40 MG) BY MOUTH DAILY (Patient taking  differently: Take 40 mg by mouth daily.) 30 tablet 5 Past Month  ? potassium chloride SA (KLOR-CON M) 20 MEQ tablet Take 1 tablet (20 mEq total) by mouth daily. 90 tablet 0 Past Month  ? rosuvastatin (CRESTOR) 20 MG tablet TAKE 1 TABLET BY MOUTH EVERY MONDAY, WEDNESDAY, FRIDAY, AND SATURDAY (Patient taking differently: Take 20 mg by mouth See admin instructions. TAKE 1 TABLET BY MOUTH EVERY MONDAY, WEDNESDAY, FRIDAY, AND SATURDAY) 48 tablet 6 Past Month  ? torsemide (DEMADEX) 20 MG tablet Take 0.5 tablets (10 mg total) by mouth as needed. (Patient taking differently: Take 10 mg by mouth daily.) 180 tablet 3 Past Month  ? benzonatate (TESSALON) 100 MG capsule Take 1 capsule (100 mg total) by mouth 2 (two) times daily as needed for cough. (Patient not taking: Reported on 07/12/2021) 20 capsule 0 Completed Course  ? UNABLE TO FIND Under pads use as  needed  ?Pullups use as needed  ? ?Length of need- 99 months ?DX urinary incontinence 100 each 11   ? UNABLE TO FIND Incontinence briefs and pads ?Dx incontinence 100 each 11   ? ?Scheduled: ? acetylcysteine  3 mL Nebulization BID  ? [START ON 07/14/2021] enoxaparin (LOVENOX) injection  40 mg Subcutaneous Daily  ? metoprolol tartrate  5 mg Intravenous Q6H  ? pantoprazole (PROTONIX) IV  40 mg Intravenous Q12H  ? ?Continuous: ? ?Assessment/Plan:  ?Orophayrangeal dyspahgia presumed to be due to primary esophageal carcinoma with extensive metastatic disease-in a 66 year old nonverbal patient with multiple medical issues mentioned above-after prolonged discussion with the family the patient has decided not to get an EGD done as there is a risk of death during the procedure.  They have chosen comfort care and this decision has been discussed by me with Dr. Eulogio Bear.  Please let us know if any further help is needed from a GI standpoint; we will sign off now. ? ? LOS: 2 days  ? ?Ladeana Laplant ?07/13/2021, 12:17 PM ? ? ?

## 2021-07-13 NOTE — Progress Notes (Signed)
?  Transition of Care (TOC) Screening Note ? ? ?Patient Details  ?Name: Danielle Cruz ?Date of Birth: [birth year] ? ? ?Transition of Care (TOC) CM/SW Contact:    ?Tom-Johnson, Renea Ee, RN ?Phone Number: ?07/13/2021, 3:40 PM ? ?CM spoke with patient and sister, [name] at bedside about needs for post hospital transition. Patient is non verbal and nodded her head in acceptance for CM to speak with [name].  ?Admitted for Oropharyngeal dysphagia. Noted have Esophageal Malignancy with metastasis. Pending GI consult or EGD and biopsy. Palliative to follow to establish GOC. ?Patient is from home with husband. Has three supportive children. On disability. Has Rt sided weakness and non verbal from Stroke in 2020. Has a Rt BKA and Lt AKA.Has a wheelchair at home. [name] states that one of patient's daughter's is her caregiver, she is a Quarry manager and employed by [employer]. She works with patient from Mon-Friday, 9 am -5 pm. [name] states that patient's siblings takes turns on weekends to be with her.  ?PCP is Fayrene Helper, MD and uses [facility] on [address] ?Transition of Care Department [facility]) has reviewed patient and no TOC needs or recommendations have been identified at this time. TOC will continue to monitor patient advancement through interdisciplinary progression rounds. If new patient transition needs arise, please place a TOC consult. ?Family to transport at discharge. ?  ?

## 2021-07-13 NOTE — Progress Notes (Signed)
? ?                                                                                                                                                     ?                                                   ?Daily Progress Note  ? ?Patient Name: Danielle Cruz       Date: 07/13/2021 ?DOB: [birth year]  Age: 66 y.o. MRN#: [id number] ?Attending Physician: Geradine Girt, DO ?Primary Care Physician: Fayrene Helper, MD ?Admit Date: 07/10/2021 ? ?Reason for Consultation/Follow-up: Establishing goals of care ? ?Patient Profile/HPI:   66 y.o. female  with past medical history of toyed arthritis, coronary artery disease, history of stroke with right upper extremity weakness and aphasia-she is nonverbal, right AKA, hypertension, hyperlipidemia, peripheral vascular disease, admitted on 07/10/2021 with cough and copious production of phlegm.  She has been losing weight she has not been able to eat.  CT scan of the abdomen and pelvis showed esophageal mass that was pressing on her bronchus, liver, lymph node, and rib mets likely.  She was transferred here from [facility] due to need for endoscopy for biopsy, but she is high risk due to the mask compressing her bronchus.  Medicine consulted for goals of care. ? ?Subjective: ?Patient is awake and alert. Sister is at bedside.  ?Draven is asking for something to eat or drink. Discussed with her need for NPO for procedure- she understands.  ?Per her sister- prior to this admission she is able to get into a motorized wheelchair- but she has been sleeping a great deal and has had significant decline in the last month.  ?[name] is aware of findings on CT scan- she wishes to proceed with procedure and biopsy.  ? ?Review of Systems  ?Unable to perform ROS: Patient nonverbal  ? ? ?Physical Exam ?Vitals reviewed.  ?         ? ?Vital Signs: BP (!) 171/71 (BP Location: Left Arm)   Pulse 83   Temp 97.9 ?F (36.6 ?C)   Resp 19   Ht '5\' 7"'$  (1.702 m)   Wt 65 kg   SpO2 97%   BMI 22.44 kg/m?   ?SpO2: SpO2: 97 % ?O2 Device: O2 Device: Room Air ?O2 Flow Rate:   ? ?Intake/output summary:  ?Intake/Output Summary (Last 24 hours) at 07/13/2021 1050 ?Last data filed at 07/13/2021 0856 ?Gross per 24 hour  ?Intake 1406.94 ml  ?Output 600 ml  ?Net 806.94 ml  ? ?LBM: Last BM Date : 07/09/21 ?Baseline Weight: Weight: 65 kg ?Most recent weight: Weight: 65 kg ? ?     ?  Palliative Assessment/Data: PPS :40% ? ? ? ? ? ?Patient Active Problem List  ? Diagnosis Date Noted  ? Atrial fibrillation, new onset ([place]) 07/12/2021  ? Oropharyngeal dysphagia 07/11/2021  ? Leukocytosis 07/11/2021  ? Prolonged QT interval 07/11/2021  ? Subcutaneous nodules 07/11/2021  ? Malignant neoplasm of lower third of esophagus ([place]) 07/11/2021  ? Fatigue 07/05/2021  ? Poor appetite 07/05/2021  ? Abnormal Pap smear of cervix 02/22/2021  ? Diarrhea 05/24/2020  ? Hard stool 05/24/2020  ? S/P AKA (above knee amputation) unilateral, right ([place]) 05/16/2020  ? History of CVA (cerebrovascular accident)   ? Chronic pain 03/28/2020  ? Fatty liver 03/28/2020  ? Osteoporosis 03/28/2020  ? Long term (current) use of opiate analgesic 03/28/2020  ? PVD (peripheral vascular disease) ([place]) 12/20/2019  ? Incontinence in female 10/05/2019  ? Urinary incontinence in female 10/02/2019  ? Chronic diastolic CHF (congestive heart failure) ([place]) 10/02/2019  ? Diverticulosis of colon 02/04/2018  ? Hemorrhoid 02/04/2018  ? FH: breast cancer in relative when <42 years old 01/25/2018  ? Hematochezia   ? GI bleed 10/03/2017  ? Hip fracture ([place]) 09/22/2017  ? At high risk for falls 01/31/2017  ? Disorder of bone and cartilage 05/11/2016  ? Normocytic anemia 01/08/2015  ? Elevated ferritin 01/08/2015  ? Presence of stent in coronary artery in patient with coronary artery disease 12/07/2014  ? Cough 12/07/2014  ? Fasting hyperglycemia 11/05/2011  ? Hypokalemia 10/30/2011  ? Elevated transaminase level 01/08/2011  ? Tobacco abuse   ? Rheumatoid arthritis ([place])   ? Colonic polyp    ? 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 Peripheral vascular disease ([place]) 01/14/2010  ? Macrocytic anemia 04/01/2009  ? Hyperlipidemia 11/14/2008  ? Hypertension 2005  ? ? ?Palliative Care Assessment & Plan  ? ? ?Assessment/Recommendations/Plan ? ?Esophageal mass with likely liver and bone mets- plan for EGD and biopsy today ?Will continue to discuss [place] after biopsy results and Oncology consult- full code, full scope for now ? ? ?Code Status: ?Full code ? ?Prognosis: ? Unable to determine ? ?Discharge Planning: ?To Be Determined ? ?Care plan was discussed with patient and her sister.  ? ?Thank you for allowing the Palliative Medicine Team to assist in the care of this patient. ? ?Total time:  ?Prolonged billing:  ? ?   ?Greater than 50%  of this time was spent counseling and coordinating care related to the above assessment and plan. ? ?Mariana Kaufman, AGNP-C ?Palliative Medicine ? ? ?Please contact Palliative Medicine Team phone at [phone] for questions and concerns.  ? ? ? ? ? ? ?

## 2021-07-13 NOTE — Progress Notes (Signed)
?PROGRESS NOTE ? ? ? ?Danielle Cruz  NTZ:[id number] DOB: 09-Feb-1956 DOA: 07/10/2021 ?PCP: Fayrene Helper, MD  ? ? ?Brief Narrative:  ?Danielle Cruz is a 66 y.o. female with medical history significant of rheumatoid arthritis, PVD, CAD, prior stroke with RUE weakness, right AKA, hypertension, hyperlipidemia who presents to the emergency department accompanied by sister due to 4-week onset of poor oral intake due to inability to swallow meals (solid).  She was seen in the ED on 3/16 due to UTI and acute bronchitis and she was discharged with Keflex.  Patient returned to the ED on 3/19 due to vomiting, she was treated with IV Reglan and IV hydration was provided, she was not discharged home.  Patient was nonverbal since she had stroke in 2010, but she was able to communicate and sister at bedside was able to help to provide.  Several nodules have been noted in different parts of patient's body including both arms, anterior and posterior torso.  She also presents with cough with production of clear phlegm.  There was no report of fever, chills, chest pain or shortness of breath. ?  ?07/11/21: Patient admitted with oropharyngeal dysphagia in the setting of esophageal malignancy with metastasis.  GI consulted for EGD and biopsy today and patient had been evaluated by anesthesia and felt to be too high risk for procedure here given some bronchial compression.  Patient will be transferred to [facility] for further evaluation.  ? ?Also found to be in a fib upon admission.   ? ? ?Assessment and Plan: ?* Oropharyngeal dysphagia ?Patient was suspected to have Stage IV malignancy with a likely esophageal primary which may be responsible for patient's difficulty in being able to swallow. ?-once biopsy back will consult oncology ?-will get palliative care consult prior for GOC ? ?Atrial fibrillation, new onset ([place]) ?-new? ?-BB IV Scheduled ?-no anticoagulation due to need for procedure ?- echo ?-TSH normal ? ?Malignant neoplasm of  lower third of esophagus (HCC) ?-CT abdomen and pelvis with contrast showed Stage IV malignancy with a likely esophageal primary, with liver metastases, mediastinal and gastrohepatic ligament adenopathy with additional right hilar adenopathy, and a 1.5 cm lytic destructive lesion in the anterolateral right first rib presumably also met ?-GI eval pending ? ? ?Subcutaneous nodules ?Subcutaneous nodules noted in anterior tonsil, left upper chest and left arm ? ?Prolonged QT interval ?-replete K and Mg ? ? ?Leukocytosis ?-trend ? ?History of CVA (cerebrovascular accident) ?Patient has difficulty in being able to swallow medication possibly due to suspected esophageal cancer. ?Consider restarting home meds once this condition improves ?-sister does not think patient has had plavix in last month ? ?PVD (peripheral vascular disease) ([place]) ?Patient has right-sided AKA ?- consider restarting home meds when able ? ?Hypokalemia ?-replete ?-recheck Mg as well in the AM ? ? ?Tobacco abuse ?Patient stopped smoking about 4 weeks ago ? ? ? ? ? ? ? ? ? ?DVT prophylaxis: enoxaparin (LOVENOX) injection 40 mg Start: 07/14/21 1000 ?SCDs Start: 07/11/21 0725 ? ?  Code Status: Full Code ?Family Communication: at bedside ? ?Disposition Plan:  ?Level of care: Telemetry Medical ?Status is: Inpatient ?Remains inpatient appropriate because: needs GI eval ?  ? ?Consultants:  ?GI ?Palliative care ? ? ? ? ?Subjective: ?Mucous is starting to thin and she is able to cough up ? ?Objective: ?Vitals:  ? 07/12/21 2201 07/13/21 6759 07/13/21 0911 07/13/21 1638  ?BP: (!) 175/70 (!) 148/80  (!) 171/71  ?Pulse:  87  83  ?  Resp:  18  19  ?Temp:  98.1 ?F (36.7 ?C)  97.9 ?F (36.6 ?C)  ?TempSrc:  Oral    ?SpO2:  95% 95% 97%  ?Weight:      ?Height:      ? ? ?Intake/Output Summary (Last 24 hours) at 07/13/2021 1111 ?Last data filed at 07/13/2021 0856 ?Gross per 24 hour  ?Intake 1406.94 ml  ?Output 600 ml  ?Net 806.94 ml  ? ?Filed Weights  ? 07/10/21 1716  ?Weight:  65 kg  ? ? ?Examination: ? ? ?General: Appearance:    Chronically ill appearing female in no acute distress  ?   ?Lungs:     respirations unlabored  ?Heart:    Normal heart rate. irregular  ?   ?Neurologic:   Awake, alert, able to make needs known  ?  ?  ? ? ? ?Data Reviewed: I have personally reviewed following labs and imaging studies ? ?CBC: ?Recent Labs  ?Lab 07/10/21 ?1750 07/11/21 ?0753 07/12/21 ?0725 07/13/21 ?0506  ?WBC 13.3* 14.3* 13.7* 13.7*  ?HGB 11.3* 10.5* 10.2* 10.2*  ?HCT 36.1 32.7* 32.1* 31.0*  ?MCV 90.9 92.4 92.0 90.6  ?PLT 291 263 273 253  ? ?Basic Metabolic Panel: ?Recent Labs  ?Lab 07/10/21 ?1750 07/11/21 ?0753 07/12/21 ?0725 07/13/21 ?0506  ?NA 136 137 138 138  ?K 3.0* 3.4* 4.4 3.0*  ?CL 104 110 110 116*  ?CO2 23 18* 19* 16*  ?GLUCOSE 104* 95 83 85  ?BUN _0 ?CREATININE 0.89 0.77 0.74 0.77  ?CALCIUM 8.6* 8.2* 8.1* 8.1*  ?MG 1.9  --  1.8  --   ?PHOS  --  3.7  --   --   ? ?GFR: ?Estimated Creatinine Clearance: 68.2 mL/min (by C-G formula based on SCr of 0.77 mg/dL). ?Liver Function Tests: ?Recent Labs  ?Lab 07/11/21 ?2620 07/12/21 ?0725  ?AST 61* 65*  ?ALT 67* 63*  ?ALKPHOS 109 105  ?BILITOT 0.6 1.0  ?PROT 7.1 6.6  ?ALBUMIN 2.0* 1.7*  ? ?No results for input(s): LIPASE, AMYLASE in the last 168 hours. ? ?No results for input(s): AMMONIA in the last 168 hours. ?Coagulation Profile: ?No results for input(s): INR, PROTIME in the last 168 hours. ?Cardiac Enzymes: ?No results for input(s): CKTOTAL, CKMB, CKMBINDEX, TROPONINI in the last 168 hours. ?BNP (last 3 results) ?No results for input(s): PROBNP in the last 8760 hours. ?HbA1C: ?No results for input(s): HGBA1C in the last 72 hours. ?CBG: ?No results for input(s): GLUCAP in the last 168 hours. ?Lipid Profile: ?No results for input(s): CHOL, HDL, LDLCALC, TRIG, CHOLHDL, LDLDIRECT in the last 72 hours. ?Thyroid Function Tests: ?Recent Labs  ?  07/13/21 ?0506  ?TSH 1.653  ? ?Anemia Panel: ?No results for input(s): VITAMINB12, FOLATE, FERRITIN,  TIBC, IRON, RETICCTPCT in the last 72 hours. ?Sepsis Labs: ?No results for input(s): PROCALCITON, LATICACIDVEN in the last 168 hours. ? ?Recent Results (from the past 240 hour(s))  ?Resp Panel by RT-PCR (Flu A&B, Covid) Nasopharyngeal Swab     Status: None  ? Collection Time: 07/10/21  6:46 PM  ? Specimen: Nasopharyngeal Swab; Nasopharyngeal(NP) swabs in vial transport medium  ?Result Value Ref Range Status  ? SARS Coronavirus 2 by RT PCR NEGATIVE NEGATIVE Final  ?  Comment: (NOTE) ?SARS-CoV-2 target nucleic acids are NOT DETECTED. ? ?The SARS-CoV-2 RNA is generally detectable in upper respiratory ?specimens during the acute phase of infection. The lowest ?concentration of SARS-CoV-2 viral copies this assay can detect is ?138 copies/mL. A negative result does  not preclude SARS-Cov-2 ?infection and should not be used as the sole basis for treatment or ?other patient management decisions. A negative result may occur with  ?improper specimen collection/handling, submission of specimen other ?than nasopharyngeal swab, presence of viral mutation(s) within the ?areas targeted by this assay, and inadequate number of viral ?copies(<138 copies/mL). A negative result must be combined with ?clinical observations, patient history, and epidemiological ?information. The expected result is Negative. ? ?Fact Sheet for Patients:  ?EntrepreneurPulse.com.au ? ?Fact Sheet for Healthcare Providers:  ?IncredibleEmployment.be ? ?This test is no t yet approved or cleared by the [place] FDA and  ?has been authorized for detection and/or diagnosis of SARS-CoV-2 by ?FDA under an Emergency Use Authorization (EUA). This EUA will remain  ?in effect (meaning this test can be used) for the duration of the ?COVID-19 declaration under Section 564(b)(1) of the Act, 21 ?U.S.C.section 360bbb-3(b)(1), unless the authorization is terminated  ?or revoked sooner.  ? ? ?  ? Influenza A by PCR NEGATIVE NEGATIVE Final   ? Influenza B by PCR NEGATIVE NEGATIVE Final  ?  Comment: (NOTE) ?The Xpert Xpress SARS-CoV-2/FLU/RSV plus assay is intended as an aid ?in the diagnosis of influenza from Nasopharyngeal swab specimen

## 2021-07-14 DIAGNOSIS — Z515 Encounter for palliative care: Secondary | ICD-10-CM

## 2021-07-14 DIAGNOSIS — Z7189 Other specified counseling: Secondary | ICD-10-CM

## 2021-07-14 LAB — CBC
HCT: 32 % — ABNORMAL LOW (ref 36.0–46.0)
Hemoglobin: 10.4 g/dL — ABNORMAL LOW (ref 12.0–15.0)
MCH: 29.5 pg (ref 26.0–34.0)
MCHC: 32.5 g/dL (ref 30.0–36.0)
MCV: 90.9 fL (ref 80.0–100.0)
Platelets: 246 10*3/uL (ref 150–400)
RBC: 3.52 MIL/uL — ABNORMAL LOW (ref 3.87–5.11)
RDW: 15.5 % (ref 11.5–15.5)
WBC: 15.1 10*3/uL — ABNORMAL HIGH (ref 4.0–10.5)
nRBC: 0 % (ref 0.0–0.2)

## 2021-07-14 LAB — BASIC METABOLIC PANEL
Anion gap: 9 (ref 5–15)
BUN: 11 mg/dL (ref 8–23)
CO2: 17 mmol/L — ABNORMAL LOW (ref 22–32)
Calcium: 8.6 mg/dL — ABNORMAL LOW (ref 8.9–10.3)
Chloride: 113 mmol/L — ABNORMAL HIGH (ref 98–111)
Creatinine, Ser: 0.77 mg/dL (ref 0.44–1.00)
GFR, Estimated: >60 mL/min (ref >60)
Glucose, Bld: 98 mg/dL (ref 70–99)
Potassium: 3.5 mmol/L (ref 3.5–5.1)
Sodium: 139 mmol/L (ref 135–145)

## 2021-07-14 LAB — MAGNESIUM: Magnesium: 2 mg/dL (ref 1.7–2.4)

## 2021-07-14 MED ORDER — SCOPOLAMINE 1 MG/3DAYS TD PT72
1.0000 | MEDICATED_PATCH | TRANSDERMAL | Status: DC
  Administered 2021-07-14: 1.5 mg via TRANSDERMAL
  Filled 2021-07-14: qty 1

## 2021-07-14 MED ORDER — LORAZEPAM 1 MG PO TABS: 1.0000 mg | ORAL_TABLET | ORAL | Status: DC | PRN

## 2021-07-14 MED ORDER — HYDROCOD POLI-CHLORPHE POLI ER 10-8 MG/5ML PO SUER
5.0000 mL | Freq: Two times a day (BID) | ORAL | Status: DC
  Administered 2021-07-14 – 2021-07-15 (×3): 5 mL via ORAL
  Filled 2021-07-14 (×3): qty 5

## 2021-07-14 MED ORDER — MORPHINE SULFATE (CONCENTRATE) 10 MG/0.5ML PO SOLN
5.0000 mg | ORAL | Status: DC | PRN
Start: 2021-07-14 — End: 2021-07-16

## 2021-07-14 NOTE — TOC Progression Note (Signed)
Transition of Care (TOC) - Progression Note  ? ? ?Patient Details  ?Name: Taleen JULIANE GUEST ?MRN: [id number] ?Date of Birth: 04-17-1956 ? ?Transition of Care (TOC) CM/SW Contact  ?Tom-Johnson, Renea Ee, RN ?Phone Number: ?07/14/2021, 4:28 PM ? ?Clinical Narrative:    ? ?TOC consulted for Hospice care at home. CM gave patient's daughter list of Hospice providers from Medicare.gov and she chose [facility]. CM called in referral to Keta, intake coordinator and she states she will call patient's family and get back with CM. Awaiting call. CM will continue to follow with needs.   ? ?Expected Discharge Plan: [place] ?Barriers to Discharge: No Barriers Identified ? ?Expected Discharge Plan and Services ?Expected Discharge Plan: [place] ?  ?Discharge Planning Services: CM Consult ?Post Acute Care Choice: Hospice ?Living arrangements for the past 2 months: [place] ?                ?  ?  ?  ?  ?  ?  ?[place] Agency: [facility] ?Date [place]: 07/14/21 ?Time [place]: 1540 ?Representative spoke with at [place]: [place] ? ? ?Social Determinants of Health (SDOH) Interventions ?  ? ?Readmission Risk Interventions ? ?  07/14/2021  ?  3:09 PM  ?Readmission Risk Prevention Plan  ?Transportation Screening Complete  ?PCP or Specialist Appt within 3-5 Days Complete  ?[place] or Home Care Consult Complete  ?Social Work Consult for [place] Planning/Counseling Complete  ?Palliative Care Screening Complete  ?Medication Review Press photographer) Complete  ? ? ?

## 2021-07-14 NOTE — Progress Notes (Signed)
? ?                                                                                                                                                     ?                                                   ?Daily Progress Note  ? ?Patient Name: Danielle Cruz       Date: 07/14/2021 ?DOB: February 07, 1956  Age: 66 y.o. MRN#: [id number] ?Attending Physician: Geradine Girt, DO ?Primary Care Physician: Fayrene Helper, MD ?Admit Date: 07/10/2021 ? ?Reason for Consultation/Follow-up: Establishing goals of care ? ?Patient Profile/HPI: 66 y.o. female  with past medical history of rheumatoid arthritis, coronary artery disease, history of stroke with right upper extremity weakness and aphasia-she is nonverbal, right AKA, hypertension, hyperlipidemia, peripheral vascular disease, admitted on 07/10/2021 with cough and copious production of phlegm.  She has been losing weight she has not been able to eat.  CT scan of the abdomen and pelvis showed esophageal mass that was pressing on her bronchus, liver, lymph node, and rib mets likely.  She was transferred here from [facility] due to need for endoscopy for biopsy, but she is high risk due to the mask compressing her bronchus.  Palliative Medicine consulted for goals of care. ? ?Subjective: ?Patient sitting up in bed with daughter and niece at bedside.  ?Per family patient has been up every few minutes coughing up secretions.  ?Xaria and family express desire to transition to comfort approach and focus and getting home with home hospice.  ?Her daughter is planning on taking care of her at home and interested in DME to help with taking care of Mariselda. She feels a hospital bed and purewick would help with keeping her mother comfortable. ?We also discussed code status. Runell's husband [name] and sister were also present on the phone. We discussed resuscitation may not be in line with Junes goals to be comfortable and passing with dignity. Dorrie indicates would like to be DNR. Family  supportive of Walterine's decision.  ? ?Review of Systems  ?Unable to perform ROS: Patient nonverbal  ?Respiratory:  Positive for cough and sputum production.   ? ?Physical Exam ?Vitals reviewed.  ?Constitutional:   ?   Comments: Uncomfortable  ?Pulmonary:  ?   Comments: intermittently coughing with bloody sputum  ? ?Neurological:  ?   Mental Status: She is alert.  ?   Comments: Nonverbal, responsive to family and able to indicate yes/no  ?         ?Vital Signs: BP (!) 146/70 (BP Location: Left Arm)   Pulse 83   Temp  97.7 ?F (36.5 ?C)   Resp 18   Ht '5\' 7"'$  (1.702 m)   Wt 65 kg   SpO2 (!) 26%   BMI 22.44 kg/m?  ?SpO2: SpO2: (!) 26 % ?O2 Device: O2 Device: Room Air ?O2 Flow Rate:   ? ?Intake/output summary:  ?Intake/Output Summary (Last 24 hours) at 07/14/2021 1023 ?Last data filed at 07/14/2021 0900 ?Gross per 24 hour  ?Intake 240 ml  ?Output 600 ml  ?Net -360 ml  ? ?LBM: Last BM Date : 07/09/21 ?Baseline Weight: Weight: 65 kg ?Most recent weight: Weight: 65 kg ? ?     ?Palliative Assessment/Data: PPS 40% ? ? ? ? ? ?Patient Active Problem List  ? Diagnosis Date Noted  ? Atrial fibrillation, new onset ([place]) 07/12/2021  ? Oropharyngeal dysphagia 07/11/2021  ? Leukocytosis 07/11/2021  ? Prolonged QT interval 07/11/2021  ? Subcutaneous nodules 07/11/2021  ? Malignant neoplasm of lower third of esophagus ([place]) 07/11/2021  ? Fatigue 07/05/2021  ? Poor appetite 07/05/2021  ? Abnormal Pap smear of cervix 02/22/2021  ? Diarrhea 05/24/2020  ? Hard stool 05/24/2020  ? S/P AKA (above knee amputation) unilateral, right ([place]) 05/16/2020  ? History of CVA (cerebrovascular accident)   ? Chronic pain 03/28/2020  ? Fatty liver 03/28/2020  ? Osteoporosis 03/28/2020  ? Long term (current) use of opiate analgesic 03/28/2020  ? PVD (peripheral vascular disease) ([place]) 12/20/2019  ? Incontinence in female 10/05/2019  ? Urinary incontinence in female 10/02/2019  ? Chronic diastolic CHF (congestive heart failure) ([place]) 10/02/2019  ?  Diverticulosis of colon 02/04/2018  ? Hemorrhoid 02/04/2018  ? FH: breast cancer in relative when <43 years old 01/25/2018  ? Hematochezia   ? GI bleed 10/03/2017  ? Hip fracture ([place]) 09/22/2017  ? At high risk for falls 01/31/2017  ? Disorder of bone and cartilage 05/11/2016  ? Normocytic anemia 01/08/2015  ? Elevated ferritin 01/08/2015  ? Presence of stent in coronary artery in patient with coronary artery disease 12/07/2014  ? Cough 12/07/2014  ? Fasting hyperglycemia 11/05/2011  ? Hypokalemia 10/30/2011  ? Elevated transaminase level 01/08/2011  ? Tobacco abuse   ? Rheumatoid arthritis ([place])   ? Colonic polyp   ? Hemiplegia, late effect of cerebrovascular disease ([place]) 03/16/2010  ? Peripheral vascular disease ([place]) 01/14/2010  ? Macrocytic anemia 04/01/2009  ? Hyperlipidemia 11/14/2008  ? Hypertension 2005  ? ? ?Palliative Care Assessment & Plan  ? ? ?Assessment/Recommendations/Plan ? ?Esophageal mass with likely liver and bone mets- Patient deferred EGD and biopsy, transitioned to comfort care  ?[place] discussion today given transition to comfort care with plan for home with hospice -  DNR, focus on symptom management ?Comfort care orders placed, Scheduled scopolomine patch to dry secretions and tussionex for cough, PRN ativan, morphine, duonebs ? ?Code Status: ?DNR ? ?Prognosis: ? < 6 months ? ?Discharge Planning: ?Home with Hospice ? ?Care plan was discussed with Patient, daughter, niece, sister, and husband  ? ?Thank you for allowing the Palliative Medicine Team to assist in the care of this patient. ? ? ?Iona Beard, MD ?Internal Medicine, PGY-2  ? ? ?Mariana Kaufman, AGNP-C ?Palliative Medicine ? ? ?Please contact Palliative Medicine Team phone at [phone] for questions and concerns.  ? ? ? ? ? ? ?

## 2021-07-14 NOTE — Care Management Important Message (Signed)
Important Message ? ?Patient Details  ?Name: Danielle Cruz ?MRN: [id number] ?Date of Birth: July 02, 1955 ? ? ?Medicare Important Message Given:  Yes ? ? ? ? ?[name] ?07/14/2021, 1:45 PM ?

## 2021-07-14 NOTE — Progress Notes (Signed)
?PROGRESS NOTE ? ? ? ?Danielle Cruz  UXN:[id number] DOB: 1955/11/08 DOA: 07/10/2021 ?PCP: Fayrene Helper, MD  ? ? ?Brief Narrative:  ?Danielle Cruz is a 66 y.o. female with medical history significant of rheumatoid arthritis, PVD, CAD, prior stroke with RUE weakness, right AKA, hypertension, hyperlipidemia who presents to the emergency department accompanied by sister due to 4-week onset of poor oral intake due to inability to swallow meals (solid).  She was seen in the ED on 3/16 due to UTI and acute bronchitis and she was discharged with Keflex.  Patient returned to the ED on 3/19 due to vomiting, she was treated with IV Reglan and IV hydration was provided, she was not discharged home.  Patient was nonverbal since she had stroke in 2010, but she was able to communicate and sister at bedside was able to help to provide.  Several nodules have been noted in different parts of patient's body including both arms, anterior and posterior torso.  She also presents with cough with production of clear phlegm.  There was no report of fever, chills, chest pain or shortness of breath. ?  ?07/11/21: Patient admitted with oropharyngeal dysphagia in the setting of esophageal malignancy with metastasis.  GI consulted for EGD and biopsy today and patient had been evaluated by anesthesia and felt to be too high risk for procedure here given some bronchial compression.  Patient will be transferred to [facility] for further evaluation.  ? ?Also found to be in a fib upon admission.  ? ?3/27: family do not want to under-go the high risk EGD.  Plan to transition to hospice with goal of home  hospice when able. ?  ? ? ?Assessment and Plan: ?* Oropharyngeal dysphagia ?Patient was suspected to have Stage IV malignancy with a likely esophageal primary which may be responsible for patient's difficulty in being able to swallow. ?-comfort feeds ?-transition to comfort care/hospice ? ?Atrial fibrillation, new onset ([place]) ?-new? ?-paroxysmal ?-BB  IV Scheduled for now ?-no anticoagulation as transition to comfort ?- echo done ? ? ?Malignant neoplasm of lower third of esophagus (HCC) ?-CT abdomen and pelvis with contrast showed Stage IV malignancy with a likely esophageal primary, with liver metastases, mediastinal and gastrohepatic ligament adenopathy with additional right hilar adenopathy, and a 1.5 cm lytic destructive lesion in the anterolateral right first rib presumably also met ?-GI consult: family and patient have elected not to undergo EGD/biopsy due to high risk nature of procedure ?-transition to comfort ? ? ?Subcutaneous nodules ?Subcutaneous nodules noted in anterior tonsil, left upper chest and left arm ?-transition to comfort ? ?Prolonged QT interval ?-repleted K and Mg ? ? ?Leukocytosis ?-transition to comfort ? ?History of CVA (cerebrovascular accident) ?Patient has difficulty in being able to swallow medication possibly due to suspected esophageal cancer. ?-transition to comfort ? ?PVD (peripheral vascular disease) ([place]) ?-transition to comfort ? ?Hypokalemia ?-repleted ?-now comfort focused ? ? ?Tobacco abuse ?Patient stopped smoking about 4 weeks ago ? ? ? ? ? ? ? ? ? ?DVT prophylaxis:  ? ?  Code Status: DNR ?Family Communication: at bedside ? ?Disposition Plan:  ?Level of care: Telemetry Medical ?Status is: Inpatient ?Remains inpatient appropriate because: home with hospice when arranged ?  ? ?Consultants:  ?GI ?Palliative care ? ? ? ? ?Subjective: ?Family and patient want to go home with hospice-- will need hospital bed ? ?Objective: ?Vitals:  ? 07/13/21 2040 07/13/21 2058 07/14/21 0357 07/14/21 0927  ?BP: (!) 165/76  (!) 141/62 (!) 146/70  ?  Pulse: 80  88 83  ?Resp: _0 ?Temp: 97.7 ?F (36.5 ?C)  98.3 ?F (36.8 ?C) 97.7 ?F (36.5 ?C)  ?TempSrc: Oral  Oral   ?SpO2: 100% 100% 100% (!) 26%  ?Weight:      ?Height:      ? ? ?Intake/Output Summary (Last 24 hours) at 07/14/2021 1036 ?Last data filed at 07/14/2021 0900 ?Gross per 24 hour   ?Intake 240 ml  ?Output 600 ml  ?Net -360 ml  ? ?Filed Weights  ? 07/10/21 1716  ?Weight: 65 kg  ? ? ?Examination: ? ? ?General: Appearance:    Chronically ill female in no acute distress  ?   ? ? ? ?Data Reviewed: I have personally reviewed following labs and imaging studies ? ?CBC: ?Recent Labs  ?Lab 07/10/21 ?1750 07/12/21 ?0725 07/13/21 ?0506 07/14/21 ?3254  ?WBC 13.3* 13.7* 13.7* 15.1*  ?HGB 11.3* 10.2* 10.2* 10.4*  ?HCT 36.1 32.1* 31.0* 32.0*  ?MCV 90.9 92.0 90.6 90.9  ?PLT 291 273 253 246  ? ?Basic Metabolic Panel: ?Recent Labs  ?Lab 07/10/21 ?1750 07/11/21 ?0753 07/12/21 ?0725 07/13/21 ?9826 07/14/21 ?4158  ?NA 136 137 138 138 139  ?K 3.0* 3.4* 4.4 3.0* 3.5  ?CL 104 110 110 116* 113*  ?CO2 23 18* 19* 16* 17*  ?GLUCOSE 104* 95 83 85 98  ?BUN _1 ?CREATININE 0.89 0.77 0.74 0.77 0.77  ?CALCIUM 8.6* 8.2* 8.1* 8.1* 8.6*  ?MG 1.9  --  1.8  --  2.0  ?PHOS  --  3.7  --   --   --   ? ?GFR: ?Estimated Creatinine Clearance: 68.2 mL/min (by C-G formula based on SCr of 0.77 mg/dL). ?Liver Function Tests: ?Recent Labs  ?Lab 07/11/21 ?3094 07/12/21 ?0725  ?AST 61* 65*  ?ALT 67* 63*  ?ALKPHOS 109 105  ?BILITOT 0.6 1.0  ?PROT 7.1 6.6  ?ALBUMIN 2.0* 1.7*  ? ?No results for input(s): LIPASE, AMYLASE in the last 168 hours. ? ?No results for input(s): AMMONIA in the last 168 hours. ?Coagulation Profile: ?No results for input(s): INR, PROTIME in the last 168 hours. ?Cardiac Enzymes: ?No results for input(s): CKTOTAL, CKMB, CKMBINDEX, TROPONINI in the last 168 hours. ?BNP (last 3 results) ?No results for input(s): PROBNP in the last 8760 hours. ?HbA1C: ?No results for input(s): HGBA1C in the last 72 hours. ?CBG: ?No results for input(s): GLUCAP in the last 168 hours. ?Lipid Profile: ?No results for input(s): CHOL, HDL, LDLCALC, TRIG, CHOLHDL, LDLDIRECT in the last 72 hours. ?Thyroid Function Tests: ?Recent Labs  ?  07/13/21 ?0506  ?TSH 1.653  ? ?Anemia Panel: ?No results for input(s): VITAMINB12, FOLATE, FERRITIN, TIBC,  IRON, RETICCTPCT in the last 72 hours. ?Sepsis Labs: ?No results for input(s): PROCALCITON, LATICACIDVEN in the last 168 hours. ? ?Recent Results (from the past 240 hour(s))  ?Resp Panel by RT-PCR (Flu A&B, Covid) Nasopharyngeal Swab     Status: None  ? Collection Time: 07/10/21  6:46 PM  ? Specimen: Nasopharyngeal Swab; Nasopharyngeal(NP) swabs in vial transport medium  ?Result Value Ref Range Status  ? SARS Coronavirus 2 by RT PCR NEGATIVE NEGATIVE Final  ?  Comment: (NOTE) ?SARS-CoV-2 target nucleic acids are NOT DETECTED. ? ?The SARS-CoV-2 RNA is generally detectable in upper respiratory ?specimens during the acute phase of infection. The lowest ?concentration of SARS-CoV-2 viral copies this assay can detect is ?138 copies/mL. A negative result does not preclude SARS-Cov-2 ?infection and should not be used as the sole  basis for treatment or ?other patient management decisions. A negative result may occur with  ?improper specimen collection/handling, submission of specimen other ?than nasopharyngeal swab, presence of viral mutation(s) within the ?areas targeted by this assay, and inadequate number of viral ?copies(<138 copies/mL). A negative result must be combined with ?clinical observations, patient history, and epidemiological ?information. The expected result is Negative. ? ?Fact Sheet for Patients:  ?EntrepreneurPulse.com.au ? ?Fact Sheet for Healthcare Providers:  ?IncredibleEmployment.be ? ?This test is no t yet approved or cleared by the [place] FDA and  ?has been authorized for detection and/or diagnosis of SARS-CoV-2 by ?FDA under an Emergency Use Authorization (EUA). This EUA will remain  ?in effect (meaning this test can be used) for the duration of the ?COVID-19 declaration under Section 564(b)(1) of the Act, 21 ?U.S.C.section 360bbb-3(b)(1), unless the authorization is terminated  ?or revoked sooner.  ? ? ?  ? Influenza A by PCR NEGATIVE NEGATIVE Final  ?  Influenza B by PCR NEGATIVE NEGATIVE Final  ?  Comment: (NOTE) ?The Xpert Xpress SARS-CoV-2/FLU/RSV plus assay is intended as an aid ?in the diagnosis of influenza from Nasopharyngeal swab specimens and ?sho

## 2021-07-15 ENCOUNTER — Telehealth: Payer: Self-pay

## 2021-07-15 MED ORDER — SCOPOLAMINE 1 MG/3DAYS TD PT72: 1.0000 | MEDICATED_PATCH | TRANSDERMAL | 12 refills | Status: AC

## 2021-07-15 MED ORDER — LORAZEPAM 1 MG PO TABS: 1.0000 mg | ORAL_TABLET | ORAL | 0 refills | Status: AC | PRN

## 2021-07-15 MED ORDER — MORPHINE SULFATE (CONCENTRATE) 10 MG/0.5ML PO SOLN
5.0000 mg | ORAL | 0 refills | Status: AC | PRN
Start: 2021-07-15 — End: ?

## 2021-07-15 NOTE — TOC Transition Note (Signed)
Transition of Care (TOC) - CM/SW Discharge Note ? ? ?Patient Details  ?Name: Danielle Cruz ?MRN: [id number] ?Date of Birth: 1955/11/11 ? ?Transition of Care (TOC) CM/SW Contact:  ?Tom-Johnson, Renea Ee, RN ?Phone Number: ?07/15/2021, 4:54 PM ? ? ?Clinical Narrative:    ? ?Patient is scheduled for discharge today. Consent signed by family and Keta with [facility] notified CM that DME's has been to patient's home and to schedule transportation. [facility] called and transportation scheduled. No further TOC needs noted. ? ?Final next level of care: [place] ?Barriers to Discharge: No Barriers Identified ? ? ?Patient Goals and CMS Choice ?Patient states their goals for this hospitalization and ongoing recovery are:: To return home ?CMS Medicare.gov Compare Post Acute Care list provided to:: Patient ?Choice offered to / list presented to : Patient, Adult Children, Spouse (Daughter) ? ?Discharge Placement ?  ?           ?  ?Patient to be transferred to facility by: [facility] ?Name of family member notified: Daughter ?  ? ?Discharge Plan and Services ?  ?Discharge Planning Services: CM Consult ?Post Acute Care Choice: Hospice          ?DME Arranged: Hospital bed, Overbed table, Specialty mattress ?DME Agency:  ([facility]) ?Date DME Agency Contacted: 07/15/21 ?Time DME Agency Contacted: [phone] ?Representative spoke with at DME Agency: [place] ?  ?[place] Agency: [facility] ?Date [place]: 07/14/21 ?Time [place]: 1540 ?Representative spoke with at [place]: [place] ? ?Social Determinants of Health (SDOH) Interventions ?  ? ? ?Readmission Risk Interventions ? ?  07/14/2021  ?  3:09 PM  ?Readmission Risk Prevention Plan  ?Transportation Screening Complete  ?PCP or Specialist Appt within 3-5 Days Complete  ?[place] or Home Care Consult Complete  ?Social Work Consult for [place] Planning/Counseling Complete  ?Palliative Care Screening Complete  ?Medication Review Press photographer)  Complete  ? ? ? ? ? ?

## 2021-07-15 NOTE — Progress Notes (Signed)
? ?                                                                                                                                                     ?                                                   ?Daily Progress Note  ? ?Patient Name: Danielle Cruz       Date: 07/15/2021 ?DOB: 1955-11-15  Age: 66 y.o. MRN#: [id number] ?Attending Physician: Caren Griffins, MD ?Primary Care Physician: Fayrene Helper, MD ?Admit Date: 07/10/2021 ? ?Reason for Consultation/Follow-up: Establishing goals of care ? ?Patient Profile/HPI: 66 y.o. female  with past medical history of rheumatoid arthritis, coronary artery disease, history of stroke with right upper extremity weakness and aphasia-she is nonverbal, right AKA, hypertension, hyperlipidemia, peripheral vascular disease, admitted on 07/10/2021 with cough and copious production of phlegm.  She has been losing weight she has not been able to eat.  CT scan of the abdomen and pelvis showed esophageal mass that was pressing on her bronchus, liver, lymph node, and rib mets likely.  She was transferred here from [facility] due to need for endoscopy for biopsy, but she is high risk due to the mask compressing her bronchus.  Palliative Medicine consulted for goals of care. ? ?Subjective: ?Chart reviewed including progress notes, medication usage. No prns needed overnight. Taking tussionex without complications. Scop patch in place. ?Danielle Cruz is sitting up, awake and alert. Appears less distressed today. Secretions are decreased.  ?Arrangements being made for home with hospice, hopefully today.  ? ?Review of Systems  ?Unable to perform ROS: Patient nonverbal  ?Respiratory:  Positive for cough and sputum production.   ? ?Physical Exam ?Vitals reviewed.  ?Constitutional:   ?   Comments: frail  ?Pulmonary:  ?   Effort: Pulmonary effort is normal.  ?   Comments:  ? ?Neurological:  ?   Mental Status: She is alert.  ?   Comments: Nonverbal, responsive to family and able to indicate  yes/no  ?         ?Vital Signs: BP (!) 146/70 (BP Location: Left Arm)   Pulse 92   Temp 97.7 ?F (36.5 ?C)   Resp 18   Ht '5\' 7"'$  (1.702 m)   Wt 65 kg   SpO2 98%   BMI 22.44 kg/m?  ?SpO2: SpO2: 98 % ?O2 Device: O2 Device: Room Air ?O2 Flow Rate:   ? ?Intake/output summary:  ?Intake/Output Summary (Last 24 hours) at 07/15/2021 1057 ?Last data filed at 07/15/2021 0600 ?Gross per 24 hour  ?Intake 0 ml  ?Output 750 ml  ?Net -750 ml  ? ? ?  LBM: Last BM Date : 07/09/21 ?Baseline Weight: Weight: 65 kg ?Most recent weight: Weight: 65 kg ? ?     ?Palliative Assessment/Data: PPS 40% ? ? ? ? ? ?Patient Active Problem List  ? Diagnosis Date Noted  ? Atrial fibrillation, new onset ([place]) 07/12/2021  ? Oropharyngeal dysphagia 07/11/2021  ? Leukocytosis 07/11/2021  ? Prolonged QT interval 07/11/2021  ? Subcutaneous nodules 07/11/2021  ? Malignant neoplasm of lower third of esophagus ([place]) 07/11/2021  ? Fatigue 07/05/2021  ? Poor appetite 07/05/2021  ? Abnormal Pap smear of cervix 02/22/2021  ? Diarrhea 05/24/2020  ? Hard stool 05/24/2020  ? S/P AKA (above knee amputation) unilateral, right ([place]) 05/16/2020  ? History of CVA (cerebrovascular accident)   ? Chronic pain 03/28/2020  ? Fatty liver 03/28/2020  ? Osteoporosis 03/28/2020  ? Long term (current) use of opiate analgesic 03/28/2020  ? PVD (peripheral vascular disease) ([place]) 12/20/2019  ? Incontinence in female 10/05/2019  ? Urinary incontinence in female 10/02/2019  ? Chronic diastolic CHF (congestive heart failure) ([place]) 10/02/2019  ? Diverticulosis of colon 02/04/2018  ? Hemorrhoid 02/04/2018  ? FH: breast cancer in relative when <54 years old 01/25/2018  ? Hematochezia   ? GI bleed 10/03/2017  ? Hip fracture ([place]) 09/22/2017  ? At high risk for falls 01/31/2017  ? Disorder of bone and cartilage 05/11/2016  ? Normocytic anemia 01/08/2015  ? Elevated ferritin 01/08/2015  ? Presence of stent in coronary artery in patient with coronary artery disease 12/07/2014  ? Cough  12/07/2014  ? Fasting hyperglycemia 11/05/2011  ? Hypokalemia 10/30/2011  ? Elevated transaminase level 01/08/2011  ? Tobacco abuse   ? Rheumatoid arthritis ([place])   ? Colonic polyp   ? Hemiplegia, late effect of cerebrovascular disease ([place]) 03/16/2010  ? Peripheral vascular disease ([place]) 01/14/2010  ? Macrocytic anemia 04/01/2009  ? Hyperlipidemia 11/14/2008  ? Hypertension 2005  ? ? ?Palliative Care Assessment & Plan  ? ? ?Assessment/Recommendations/Plan ? ?Esophageal mass with likely liver and bone mets- Patient deferred EGD and biopsy, transitioned to comfort care  ?GOC continue to be comfort care with plan for home with hospice -  DNR, focus on symptom management ?Comfort care orders placed, Scheduled scopolomine patch to dry secretions and tussionex for cough, PRN ativan, morphine, duonebs ?On discharge, would recommend scripts for: ?- Morphine Concentrate '10mg'$ /0.3m: '5mg'$  (0.229m sublingual every 1 hour as needed for pain or shortness of breath: Disp 3066m- Lorazepam '2mg'$ /ml concentrated solution: '1mg'$  (0.5ml68mublingual every 4 hours as needed for anxiety: Disp 30ml53mHaldol '2mg'$ /ml solution: 0.'5mg'$  (0.25ml)25mlingual every 4 hours as needed for agitation or nausea: Disp 30ml  32montinue scopolamine patches and tussionex at discharge ? ?Code Status: ?DNR ? ?Prognosis: ? < 6 months ? ?Discharge Planning: ?Home with Hospice ? ?Care plan was discussed with patient and care team.  ? ?Thank you for allowing the Palliative Medicine Team to assist in the care of this patient. ? ? ?Danielle Shonka MMariana KaufmanC ?Palliative Medicine ? ? ?Please contact Palliative Medicine Team phone at [phone] and concerns.  ? ? ? ? ? ? ?

## 2021-07-15 NOTE — Discharge Summary (Signed)
? ?Physician Discharge Summary  ?Danielle Cruz WLS:[id number] DOB: July 08, 1955 DOA: 07/10/2021 ? ?PCP: Fayrene Helper, MD ? ?Admit date: 07/10/2021 ?Discharge date: 07/15/2021 ? ?Admitted From: home ?Disposition:  home with hospice ? ?Recommendations for Outpatient Follow-up:  ?Follow up with hospice MD ? ?Home Health: none ?Equipment/Devices: none ? ?Discharge Condition: stable ?CODE STATUS: DNR ?Diet recommendation: comfort feeding as tolerated ? ?HPI: Per admitting MD, ?Danielle Cruz is a 66 y.o. female with medical history significant of rheumatoid arthritis, PVD, CAD, prior stroke with RUE weakness, right AKA, hypertension, hyperlipidemia who presents to the emergency department accompanied by sister due to 4-week onset of poor oral intake due to inability to swallow meals (solid).  She was seen in the ED on 3/16 due to UTI and acute bronchitis and she was discharged with Keflex.  Patient returned to the ED on 3/19 due to vomiting, she was treated with IV Reglan and IV hydration was provided, she was not discharged home.  Patient was nonverbal since she had stroke in 2010, but she was able to communicate and sister at bedside was able to help to provide.  Several nodules have been noted in different parts of patient's body including both arms, anterior and posterior torso.  She also presents with cough with production of clear phlegm.  There was no report of fever, chills, chest pain or shortness of breath. ? ?Hospital Course / Discharge diagnoses: ?Principal Problem: ?  Oropharyngeal dysphagia ?Active Problems: ?  Tobacco abuse ?  Hypokalemia ?  PVD (peripheral vascular disease) ([place]) ?  History of CVA (cerebrovascular accident) ?  Leukocytosis ?  Prolonged QT interval ?  Subcutaneous nodules ?  Malignant neoplasm of lower third of esophagus (HCC) ?  Atrial fibrillation, new onset ([place]) ? ? 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 Palliative consulted, after discussions she will be transitioned to comfort care / hospice at home ? ?Active problems ?Atrial fibrillation, new onset ([place]) -continue beta blockers. No anticoagulation as transition to comfort. 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 GI consulted, family and patient have elected not to undergo EGD/biopsy due to high risk nature of procedure ?Subcutaneous nodules -Subcutaneous nodules noted in anterior tonsil, left upper chest and left arm ?Prolonged QT interval -repleted K and Mg ?Leukocytosis -transition to comfort ?History of CVA (cerebrovascular accident) -Patient has difficulty in being able to swallow medication possibly due to suspected esophageal cancer. ?PVD (peripheral vascular disease) ([place]) -transition to comfort ?Hypokalemia -repleted ?Tobacco abuse -Patient stopped smoking about 4 weeks ago ? ?Sepsis ruled out ? ?Discharge Instructions ? ? ?Allergies as of 07/15/2021   ? ?   Reactions  ? Ace Inhibitors Cough  ? New daily cough since starting ACE  ? ?  ? ?  ?Medication List  ?  ? ?STOP taking these medications   ? ?benzonatate 100 MG capsule ?Commonly known as: TESSALON ?  ?cephALEXin 500 MG capsule ?Commonly known as: KEFLEX ?  ?HYDROcodone-acetaminophen 5-325 MG tablet ?Commonly known as:  NORCO/VICODIN ?  ? ?  ? ?TAKE these medications   ? ?alendronate 70 MG tablet ?Commonly known as: FOSAMAX ?Take 70 mg by mouth once a week. ?  ?amLODipine 5 MG  tablet ?Commonly known as: NORVASC ?TAKE 1 TABLET(5 MG) BY MOUTH DAILY ?What changed:  ?how much to take ?how to take this ?when to take this ?additional instructions ?  ?aspirin EC 81 MG tablet ?Take 81 mg by mouth daily. Swallow whole. ?  ?CALCIUM 1200+D3 PO ?Take 1 tablet by mouth daily. ?  ?cetirizine 10 MG tablet ?Commonly known as: ZYRTEC ?Take 10 mg by mouth daily as needed for allergies. ?  ?clopidogrel 75 MG tablet ?Commonly known as: PLAVIX ?Take 75 mg by mouth daily. ?  ?ezetimibe 10 MG tablet ?Commonly known as: ZETIA ?TAKE 1 TABLET(10 MG) BY MOUTH DAILY ?What changed:  ?how much to take ?how to take this ?when to take this ?additional instructions ?  ?fluticasone 50 MCG/ACT nasal spray ?Commonly known as: FLONASE ?SHAKE LIQUID AND USE 2 SPRAYS IN EACH NOSTRIL DAILY ?What changed:  ?how much to take ?when to take this ?reasons to take this ?additional instructions ?  ?hydroxychloroquine 200 MG tablet ?Commonly known as: PLAQUENIL ?Take 200 mg by mouth See admin instructions. Take 200 mg twice daily every 3rd day ?  ?LORazepam 1 MG tablet ?Commonly known as: ATIVAN ?Place 1 tablet (1 mg total) under the tongue every 4 (four) hours as needed for anxiety or sleep. ?  ?metoprolol succinate 25 MG 24 hr tablet ?Commonly known as: TOPROL-XL ?TAKE 1 TABLET(25 MG) BY MOUTH DAILY ?What changed: See the new instructions. ?  ?montelukast 10 MG tablet ?Commonly known as: SINGULAIR ?TAKE 1 TABLET(10 MG) BY MOUTH AT BEDTIME ?What changed:  ?how much to take ?how to take this ?when to take this ?additional instructions ?  ?morphine CONCENTRATE 10 MG/0.5ML Soln concentrated solution ?Place 0.25 mLs (5 mg total) under the tongue every hour as needed for shortness of breath or moderate pain. ?  ?multivitamin with minerals Tabs tablet ?Take 1 tablet by mouth daily. ?  ?oxybutynin 5 MG tablet ?Commonly known as: DITROPAN ?TAKE 1/2 TABLET(2.5 MG) BY MOUTH TWICE DAILY ?What changed:  ?how much to take ?how to take this ?when  to take this ?additional instructions ?  ?pantoprazole 40 MG tablet ?Commonly known as: PROTONIX ?TAKE 1 TABLET(40 MG) BY MOUTH DAILY ?What changed:  ?how much to take ?how to take this ?when to take this ?additional instructions ?  ?potassium chloride SA 20 MEQ tablet ?Commonly known as: KLOR-CON M ?Take 1 tablet (20 mEq total) by mouth daily. ?  ?rosuvastatin 20 MG tablet ?Commonly known as: CRESTOR ?TAKE 1 TABLET BY MOUTH EVERY MONDAY, WEDNESDAY, FRIDAY, AND SATURDAY ?What changed:  ?how much to take ?how to take this ?when to take this ?  ?scopolamine 1 MG/3DAYS ?Commonly known as: TRANSDERM-SCOP ?Place 1 patch (1.5 mg total) onto the skin every 3 (three) days. ?Start taking on: July 17, 2021 ?  ?torsemide 20 MG tablet ?Commonly known as: DEMADEX ?Take 0.5 tablets (10 mg total) by mouth as needed. ?What changed: when to take this ?  ?UNABLE TO FIND ?Under pads use as needed  ?Pullups use as needed  ? ?Length of need- 99 months ?DX urinary incontinence ?  ?UNABLE TO FIND ?Incontinence briefs and pads ?Dx incontinence ?  ?vitamin C 1000 MG tablet ?Take 1,000 mg by mouth daily. ?  ?Vitamin D3 125 MCG (5000 UT) Caps ?Take 5,000 Units by  mouth daily. ?  ? ?  ? ? ? ?Consultations: ?GI ?Palliative  ? ?Procedures/Studies: ? ?DG Chest 2 View ? ?Result Date: 07/10/2021 ?CLINICAL DATA:  Shortness of breath EXAM: CHEST - 2 VIEW COMPARISON:  07/05/2021 FINDINGS: The heart size and mediastinal contours are within normal limits. Both lungs are clear. Slight inferior migration of the right humeral head. Aortic atherosclerosis. IMPRESSION: No active cardiopulmonary disease. Electronically Signed   By: Donavan Foil M.D.   On: 07/10/2021 18:29  ? ?DG Chest 2 View ? ?Result Date: 06/22/2021 ?CLINICAL DATA:  Cough. EXAM: CHEST - 2 VIEW COMPARISON:  Chest x-ray 10/01/2019 FINDINGS: The heart size and mediastinal contours are within normal limits. Both lungs are clear. The visualized skeletal structures are unremarkable. IMPRESSION: No  active cardiopulmonary disease. Electronically Signed   By: Ronney Asters M.D.   On: 06/22/2021 22:48  ? ?CT Soft Tissue Neck W Contrast ? ?Result Date: 07/10/2021 ?CLINICAL DATA:  Dysphagia. Weakness and cough.

## 2021-07-15 NOTE — Progress Notes (Signed)
DISCHARGE NOTE HOME ?[place] to be discharged Home per MD order. Discussed prescriptions and follow up appointments with the patient. Prescriptions given to patient; medication list explained in detail. Patient verbalized understanding. ? ?Skin clean, dry and intact without evidence of skin break down, no evidence of skin tears noted. IV catheter discontinued intact. Site without signs and symptoms of complications. Dressing and pressure applied. Pt denies pain at the site currently. No complaints noted. ? ?Patient free of lines, drains, and wounds.  ? ?An After Visit Summary (AVS) was printed and given to the patient. ?Patient escorted via wheelchair, and discharged home via private auto. ? ?Rushie Goltz, RN  ?

## 2021-07-16 ENCOUNTER — Telehealth: Payer: Self-pay | Admitting: Family Medicine

## 2021-07-16 NOTE — Telephone Encounter (Signed)
Beya with [facility] called in on patient behalf. ? ?Has concerns about patient. Patient was discharged on 3/29 with no discharge instructions. ? ?Patient also needs prescriptions,  ? ?Call back info [phone] ?

## 2021-07-20 ENCOUNTER — Other Ambulatory Visit: Payer: Self-pay | Admitting: Family Medicine

## 2021-07-20 NOTE — Telephone Encounter (Signed)
The admissions nurse said as far as she knew they had everything they needed. They did fax over an admissions packet for orders for your signature today. It is in your box.  ?

## 2021-07-22 DIAGNOSIS — I739 Peripheral vascular disease, unspecified: Secondary | ICD-10-CM

## 2021-07-22 DIAGNOSIS — C159 Malignant neoplasm of esophagus, unspecified: Secondary | ICD-10-CM | POA: Diagnosis not present

## 2021-07-22 DIAGNOSIS — I639 Cerebral infarction, unspecified: Secondary | ICD-10-CM | POA: Diagnosis not present

## 2021-07-22 DIAGNOSIS — I69320 Aphasia following cerebral infarction: Secondary | ICD-10-CM | POA: Diagnosis not present

## 2021-07-22 DIAGNOSIS — G8191 Hemiplegia, unspecified affecting right dominant side: Secondary | ICD-10-CM

## 2021-07-22 DIAGNOSIS — M818 Other osteoporosis without current pathological fracture: Secondary | ICD-10-CM | POA: Diagnosis not present

## 2021-07-22 DIAGNOSIS — C7800 Secondary malignant neoplasm of unspecified lung: Secondary | ICD-10-CM | POA: Diagnosis not present

## 2021-07-22 DIAGNOSIS — R1310 Dysphagia, unspecified: Secondary | ICD-10-CM

## 2021-07-22 DIAGNOSIS — C787 Secondary malignant neoplasm of liver and intrahepatic bile duct: Secondary | ICD-10-CM | POA: Diagnosis not present

## 2021-07-22 DIAGNOSIS — I519 Heart disease, unspecified: Secondary | ICD-10-CM | POA: Diagnosis not present

## 2021-07-22 DIAGNOSIS — I10 Essential (primary) hypertension: Secondary | ICD-10-CM | POA: Diagnosis not present

## 2021-07-22 DIAGNOSIS — E785 Hyperlipidemia, unspecified: Secondary | ICD-10-CM | POA: Diagnosis not present

## 2021-07-22 DIAGNOSIS — I482 Chronic atrial fibrillation, unspecified: Secondary | ICD-10-CM | POA: Diagnosis not present

## 2021-07-22 DIAGNOSIS — I509 Heart failure, unspecified: Secondary | ICD-10-CM | POA: Diagnosis not present

## 2021-07-27 ENCOUNTER — Telehealth: Payer: Self-pay

## 2021-07-27 NOTE — Telephone Encounter (Signed)
Called patient to remind of appt, spoke to spouse she passed away 2021-08-23. patient spouse asked to let Dr Moshe Cipro know. ?

## 2021-07-27 NOTE — Telephone Encounter (Signed)
Patients Husband was responding to a reminder call for appointment tomorrow and said his wife passed away last night.  ?

## 2021-07-28 ENCOUNTER — Ambulatory Visit: Payer: Medicare Other | Admitting: Family Medicine

## 2021-07-29 ENCOUNTER — Ambulatory Visit (HOSPITAL_COMMUNITY): Payer: Medicare Other

## 2021-07-30 ENCOUNTER — Other Ambulatory Visit: Payer: Self-pay | Admitting: Cardiology

## 2021-08-13 IMAGING — DX DG ANKLE COMPLETE 3+V*R*
3 series · 3 of 3 positions shown · non-contrast
Comparison: None.

CLINICAL DATA: Right ankle swelling for several months without
known injury. Possible ulceration.

EXAM:
RIGHT ANKLE - COMPLETE 3+ VIEW

[ankle ap]
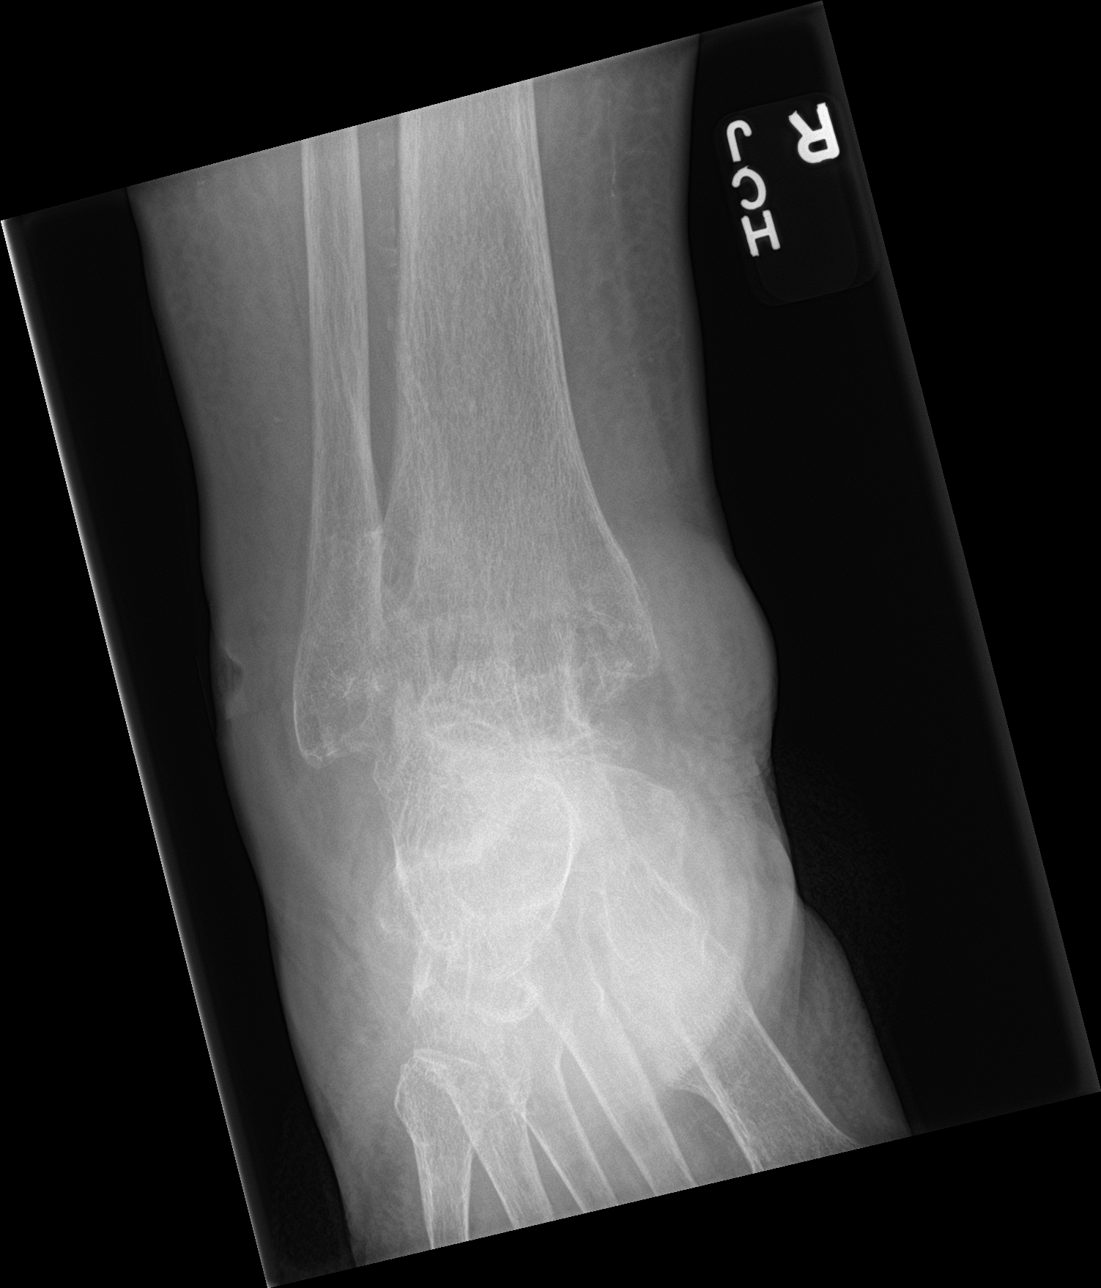

[ankle obl]
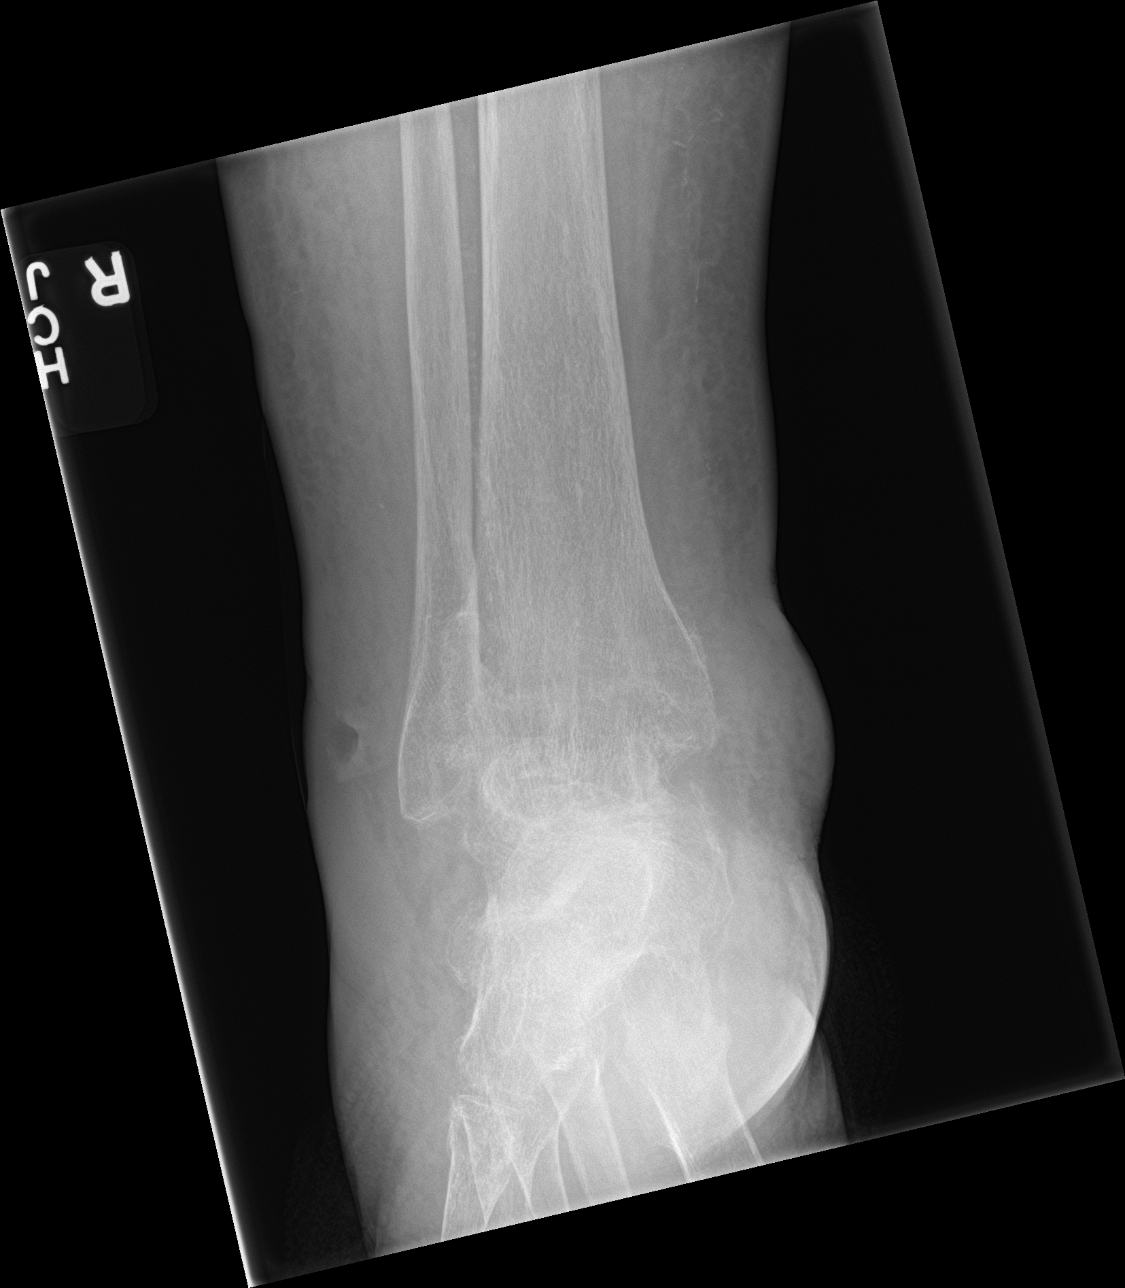

[ankle lat]
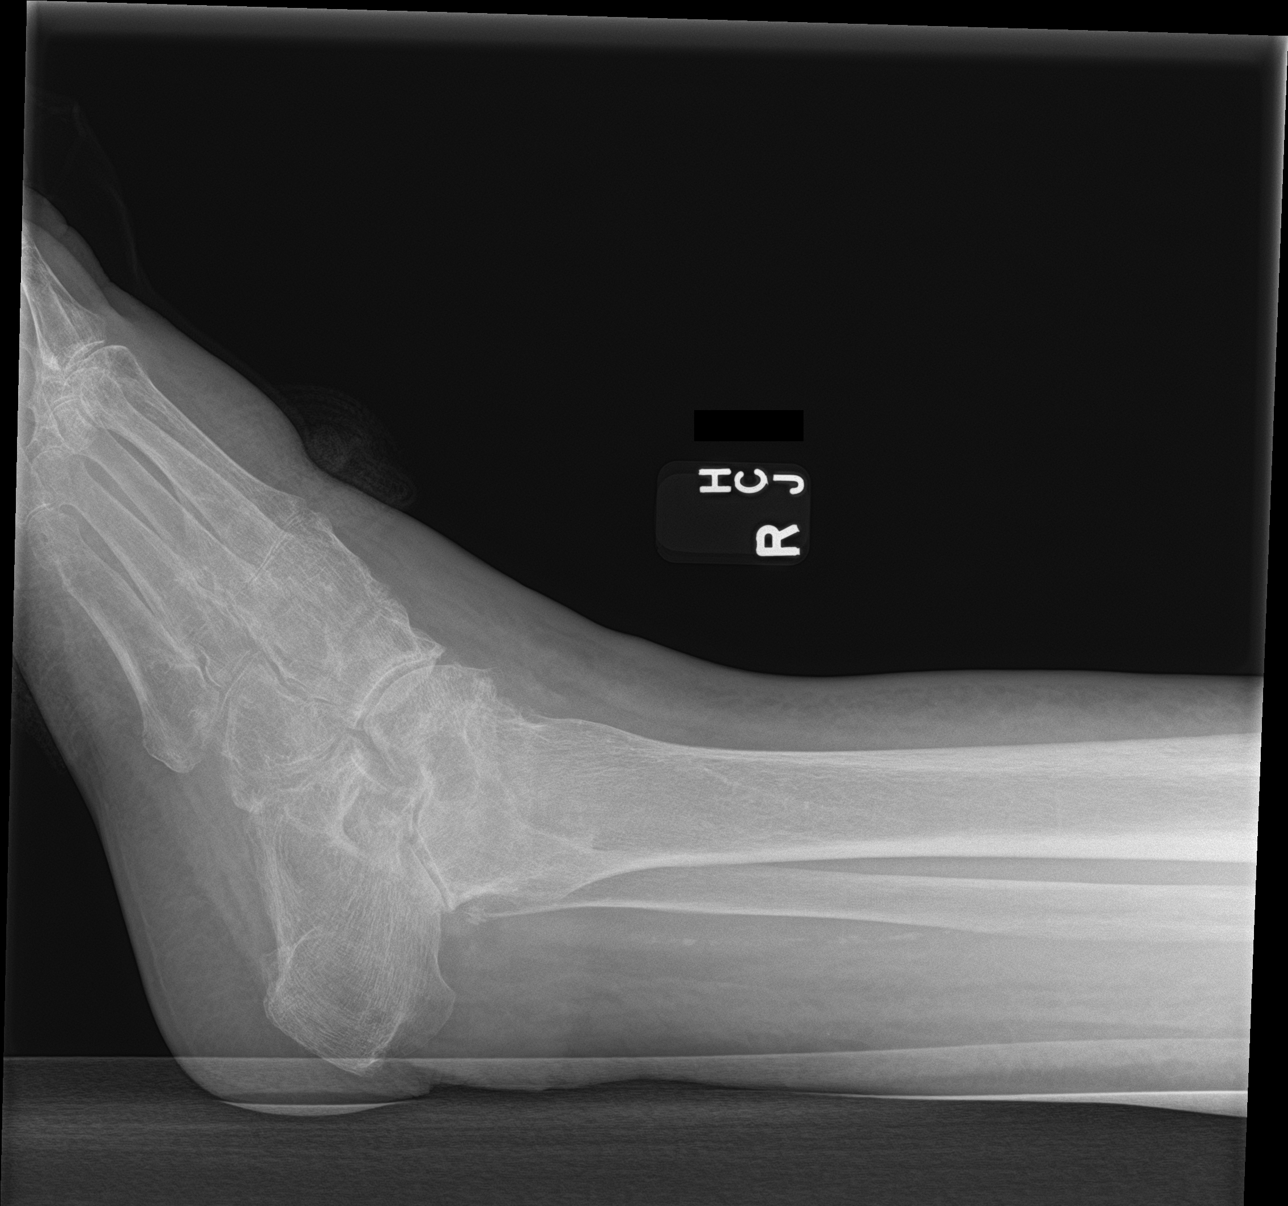

[3 of 3 positions shown; findings below may reference images not displayed]

FINDINGS: There is no evidence of fracture, dislocation, or joint effusion. No
lytic destruction is seen to suggest osteomyelitis. There appears to
be fusion of the talotibial joint which may be due to degenerative
disease. Soft tissue swelling is seen medially and laterally, with
probable ulceration noted laterally.
IMPRESSION: No fracture or dislocation is noted. Soft tissue swelling is seen
medially and laterally, with probable ulceration noted laterally.

## 2021-08-17 DEATH — deceased

## 2022-01-07 ENCOUNTER — Ambulatory Visit: Payer: Medicare Other | Admitting: Cardiology

## 2022-03-17 ENCOUNTER — Telehealth: Payer: Medicare Other
